# Patient Record
Sex: Male | Born: 1935 | Race: White | Hispanic: No | Smoking: Former smoker
Health system: Southern US, Community
[De-identification: ages and names within clinical notes are randomized; demographics above are authoritative.]

## PROBLEM LIST (undated history)

## (undated) DIAGNOSIS — J189 Pneumonia, unspecified organism: Secondary | ICD-10-CM

## (undated) DIAGNOSIS — IMO0001 Reserved for inherently not codable concepts without codable children: Secondary | ICD-10-CM

## (undated) DIAGNOSIS — J449 Chronic obstructive pulmonary disease, unspecified: Secondary | ICD-10-CM

## (undated) DIAGNOSIS — C220 Liver cell carcinoma: Secondary | ICD-10-CM

## (undated) DIAGNOSIS — D696 Thrombocytopenia, unspecified: Secondary | ICD-10-CM

## (undated) DIAGNOSIS — I10 Essential (primary) hypertension: Secondary | ICD-10-CM

## (undated) DIAGNOSIS — I739 Peripheral vascular disease, unspecified: Secondary | ICD-10-CM

## (undated) DIAGNOSIS — Z950 Presence of cardiac pacemaker: Secondary | ICD-10-CM

## (undated) DIAGNOSIS — J939 Pneumothorax, unspecified: Secondary | ICD-10-CM

## (undated) DIAGNOSIS — E039 Hypothyroidism, unspecified: Secondary | ICD-10-CM

## (undated) DIAGNOSIS — M549 Dorsalgia, unspecified: Secondary | ICD-10-CM

## (undated) DIAGNOSIS — E119 Type 2 diabetes mellitus without complications: Secondary | ICD-10-CM

## (undated) DIAGNOSIS — I2510 Atherosclerotic heart disease of native coronary artery without angina pectoris: Secondary | ICD-10-CM

## (undated) DIAGNOSIS — E43 Unspecified severe protein-calorie malnutrition: Secondary | ICD-10-CM

## (undated) DIAGNOSIS — C801 Malignant (primary) neoplasm, unspecified: Secondary | ICD-10-CM

## (undated) DIAGNOSIS — R1310 Dysphagia, unspecified: Secondary | ICD-10-CM

## (undated) DIAGNOSIS — I4892 Unspecified atrial flutter: Secondary | ICD-10-CM

## (undated) DIAGNOSIS — E785 Hyperlipidemia, unspecified: Secondary | ICD-10-CM

## (undated) DIAGNOSIS — I7102 Dissection of abdominal aorta: Secondary | ICD-10-CM

## (undated) HISTORY — DX: Presence of cardiac pacemaker: Z95.0

## (undated) HISTORY — DX: Malignant (primary) neoplasm, unspecified: C80.1

## (undated) HISTORY — PX: OTHER SURGICAL HISTORY: SHX169

## (undated) HISTORY — PX: COLONOSCOPY: SHX174

---

## 1998-04-15 ENCOUNTER — Ambulatory Visit (HOSPITAL_COMMUNITY): Admission: RE | Admit: 1998-04-15 | Discharge: 1998-04-15 | Payer: Self-pay | Admitting: *Deleted

## 1998-04-18 ENCOUNTER — Other Ambulatory Visit: Admission: RE | Admit: 1998-04-18 | Discharge: 1998-04-18 | Payer: Self-pay | Admitting: *Deleted

## 1999-12-23 ENCOUNTER — Emergency Department (HOSPITAL_COMMUNITY): Admission: EM | Admit: 1999-12-23 | Discharge: 1999-12-23 | Payer: Self-pay | Admitting: Emergency Medicine

## 2000-04-18 ENCOUNTER — Encounter: Payer: Self-pay | Admitting: Emergency Medicine

## 2000-04-18 ENCOUNTER — Emergency Department (HOSPITAL_COMMUNITY): Admission: EM | Admit: 2000-04-18 | Discharge: 2000-04-18 | Payer: Self-pay | Admitting: *Deleted

## 2000-07-22 ENCOUNTER — Encounter: Admission: RE | Admit: 2000-07-22 | Discharge: 2000-07-22 | Payer: Self-pay | Admitting: *Deleted

## 2000-07-22 ENCOUNTER — Encounter: Payer: Self-pay | Admitting: *Deleted

## 2000-12-31 HISTORY — PX: CAROTID STENT: SHX1301

## 2001-08-25 ENCOUNTER — Encounter: Payer: Self-pay | Admitting: Emergency Medicine

## 2001-08-25 ENCOUNTER — Emergency Department (HOSPITAL_COMMUNITY): Admission: EM | Admit: 2001-08-25 | Discharge: 2001-08-25 | Payer: Self-pay | Admitting: Emergency Medicine

## 2001-09-05 ENCOUNTER — Encounter: Payer: Self-pay | Admitting: Cardiovascular Disease

## 2001-09-05 ENCOUNTER — Ambulatory Visit (HOSPITAL_COMMUNITY): Admission: RE | Admit: 2001-09-05 | Discharge: 2001-09-05 | Payer: Self-pay | Admitting: Cardiovascular Disease

## 2002-05-20 ENCOUNTER — Ambulatory Visit (HOSPITAL_COMMUNITY): Admission: RE | Admit: 2002-05-20 | Discharge: 2002-05-20 | Payer: Self-pay | Admitting: Internal Medicine

## 2002-05-20 ENCOUNTER — Encounter: Payer: Self-pay | Admitting: Internal Medicine

## 2003-07-27 ENCOUNTER — Inpatient Hospital Stay (HOSPITAL_COMMUNITY): Admission: EM | Admit: 2003-07-27 | Discharge: 2003-07-28 | Payer: Self-pay | Admitting: Emergency Medicine

## 2003-07-27 ENCOUNTER — Encounter: Payer: Self-pay | Admitting: Internal Medicine

## 2004-03-04 ENCOUNTER — Emergency Department (HOSPITAL_COMMUNITY): Admission: EM | Admit: 2004-03-04 | Discharge: 2004-03-04 | Payer: Self-pay | Admitting: Emergency Medicine

## 2004-07-20 ENCOUNTER — Emergency Department (HOSPITAL_COMMUNITY): Admission: EM | Admit: 2004-07-20 | Discharge: 2004-07-20 | Payer: Self-pay | Admitting: Emergency Medicine

## 2004-10-11 ENCOUNTER — Emergency Department (HOSPITAL_COMMUNITY): Admission: EM | Admit: 2004-10-11 | Discharge: 2004-10-11 | Payer: Self-pay | Admitting: Emergency Medicine

## 2005-01-12 ENCOUNTER — Inpatient Hospital Stay (HOSPITAL_COMMUNITY): Admission: EM | Admit: 2005-01-12 | Discharge: 2005-01-14 | Payer: Self-pay | Admitting: *Deleted

## 2005-01-23 ENCOUNTER — Inpatient Hospital Stay (HOSPITAL_COMMUNITY): Admission: RE | Admit: 2005-01-23 | Discharge: 2005-01-24 | Payer: Self-pay | Admitting: *Deleted

## 2006-03-19 ENCOUNTER — Encounter: Admission: RE | Admit: 2006-03-19 | Discharge: 2006-03-19 | Payer: Self-pay | Admitting: Internal Medicine

## 2008-03-03 ENCOUNTER — Emergency Department (HOSPITAL_COMMUNITY): Admission: EM | Admit: 2008-03-03 | Discharge: 2008-03-03 | Payer: Self-pay | Admitting: Emergency Medicine

## 2008-04-28 ENCOUNTER — Emergency Department (HOSPITAL_COMMUNITY): Admission: EM | Admit: 2008-04-28 | Discharge: 2008-04-28 | Payer: Self-pay | Admitting: Emergency Medicine

## 2009-10-20 ENCOUNTER — Encounter: Admission: RE | Admit: 2009-10-20 | Discharge: 2009-10-20 | Payer: Self-pay | Admitting: Internal Medicine

## 2009-12-31 HISTORY — PX: DOPPLER ECHOCARDIOGRAPHY: SHX263

## 2010-09-20 ENCOUNTER — Encounter: Admission: RE | Admit: 2010-09-20 | Discharge: 2010-09-20 | Payer: Self-pay | Admitting: Internal Medicine

## 2010-11-23 ENCOUNTER — Emergency Department (HOSPITAL_COMMUNITY): Admission: EM | Admit: 2010-11-23 | Discharge: 2010-11-23 | Payer: Self-pay | Admitting: Emergency Medicine

## 2010-12-11 ENCOUNTER — Encounter
Admission: RE | Admit: 2010-12-11 | Discharge: 2010-12-11 | Payer: Self-pay | Source: Home / Self Care | Attending: Internal Medicine | Admitting: Internal Medicine

## 2010-12-31 HISTORY — PX: CAROTID ANGIOGRAM: SHX5765

## 2011-01-18 ENCOUNTER — Ambulatory Visit (HOSPITAL_COMMUNITY)
Admission: RE | Admit: 2011-01-18 | Discharge: 2011-01-18 | Payer: Self-pay | Source: Home / Self Care | Attending: Gastroenterology | Admitting: Gastroenterology

## 2011-01-22 LAB — CBC
HCT: 41.3 % (ref 39.0–52.0)
MCHC: 34.4 g/dL (ref 30.0–36.0)
Platelets: 172 10*3/uL (ref 150–400)
RDW: 13.8 % (ref 11.5–15.5)

## 2011-01-22 LAB — PROTIME-INR
INR: 0.99 (ref 0.00–1.49)
Prothrombin Time: 13.3 seconds (ref 11.6–15.2)

## 2011-01-22 LAB — GLUCOSE, CAPILLARY: Glucose-Capillary: 118 mg/dL — ABNORMAL HIGH (ref 70–99)

## 2011-03-13 LAB — POCT CARDIAC MARKERS
CKMB, poc: 4.1 ng/mL (ref 1.0–8.0)
Myoglobin, poc: 78.1 ng/mL (ref 12–200)
Troponin i, poc: <0.05 ng/mL (ref 0.00–0.09)

## 2011-03-13 LAB — CBC
HCT: 41.7 % (ref 39.0–52.0)
Hemoglobin: 14.4 g/dL (ref 13.0–17.0)
MCH: 32.6 pg (ref 26.0–34.0)
MCHC: 34.6 g/dL (ref 30.0–36.0)
MCV: 94.1 fL (ref 78.0–100.0)
Platelets: 124 K/uL — ABNORMAL LOW (ref 150–400)
RBC: 4.43 MIL/uL (ref 4.22–5.81)
RDW: 13.6 % (ref 11.5–15.5)
WBC: 7.1 K/uL (ref 4.0–10.5)

## 2011-03-13 LAB — COMPREHENSIVE METABOLIC PANEL WITH GFR
AST: 32 U/L (ref 0–37)
Albumin: 4.1 g/dL (ref 3.5–5.2)
BUN: 15 mg/dL (ref 6–23)
Chloride: 101 meq/L (ref 96–112)
Creatinine, Ser: 1.11 mg/dL (ref 0.4–1.5)
GFR calc Af Amer: >60 mL/min (ref >60)
Potassium: 4.2 meq/L (ref 3.5–5.1)
Total Bilirubin: 0.9 mg/dL (ref 0.3–1.2)
Total Protein: 7.3 g/dL (ref 6.0–8.3)

## 2011-03-13 LAB — DIFFERENTIAL
Basophils Absolute: 0 K/uL (ref 0.0–0.1)
Basophils Relative: 0 % (ref 0–1)
Eosinophils Absolute: 0.1 10*3/uL (ref 0.0–0.7)
Eosinophils Relative: 1 % (ref 0–5)
Lymphocytes Relative: 15 % (ref 12–46)
Lymphs Abs: 1.1 K/uL (ref 0.7–4.0)
Monocytes Absolute: 0.6 K/uL (ref 0.1–1.0)
Monocytes Relative: 9 % (ref 3–12)
Neutro Abs: 5.3 K/uL (ref 1.7–7.7)
Neutrophils Relative %: 75 % (ref 43–77)

## 2011-03-13 LAB — POCT I-STAT, CHEM 8
BUN: 15 mg/dL (ref 6–23)
Calcium, Ion: 1.03 mmol/L — ABNORMAL LOW (ref 1.12–1.32)
Chloride: 101 meq/L (ref 96–112)
Creatinine, Ser: 1.2 mg/dL (ref 0.4–1.5)
Glucose, Bld: 147 mg/dL — ABNORMAL HIGH (ref 70–99)
HCT: 43 % (ref 39.0–52.0)
Hemoglobin: 14.6 g/dL (ref 13.0–17.0)
Potassium: 3.9 meq/L (ref 3.5–5.1)
Sodium: 141 meq/L (ref 135–145)
TCO2: 28 mmol/L (ref 0–100)

## 2011-03-13 LAB — COMPREHENSIVE METABOLIC PANEL
ALT: 21 U/L (ref 0–53)
Alkaline Phosphatase: 102 U/L (ref 39–117)
CO2: 30 mEq/L (ref 19–32)
Calcium: 9.1 mg/dL (ref 8.4–10.5)
GFR calc non Af Amer: >60 mL/min (ref >60)
Glucose, Bld: 154 mg/dL — ABNORMAL HIGH (ref 70–99)
Sodium: 141 mEq/L (ref 135–145)

## 2011-03-13 LAB — URINALYSIS, ROUTINE W REFLEX MICROSCOPIC
Bilirubin Urine: NEGATIVE
Glucose, UA: 500 mg/dL — AB
Hgb urine dipstick: NEGATIVE
Ketones, ur: NEGATIVE mg/dL
Nitrite: NEGATIVE
Protein, ur: NEGATIVE mg/dL
Specific Gravity, Urine: 1.015 (ref 1.005–1.030)
Urobilinogen, UA: 1 mg/dL (ref 0.0–1.0)
pH: 8 (ref 5.0–8.0)

## 2011-03-13 LAB — LIPASE, BLOOD: Lipase: 33 U/L (ref 11–59)

## 2011-03-13 LAB — GLUCOSE, CAPILLARY: Glucose-Capillary: 160 mg/dL — ABNORMAL HIGH (ref 70–99)

## 2011-04-07 ENCOUNTER — Emergency Department (HOSPITAL_COMMUNITY)
Admission: EM | Admit: 2011-04-07 | Discharge: 2011-04-07 | Disposition: A | Discharge status: Home / Self Care | Payer: PRIVATE HEALTH INSURANCE | Attending: Emergency Medicine | Admitting: Emergency Medicine

## 2011-04-07 DIAGNOSIS — E119 Type 2 diabetes mellitus without complications: Secondary | ICD-10-CM | POA: Insufficient documentation

## 2011-04-07 DIAGNOSIS — M25519 Pain in unspecified shoulder: Secondary | ICD-10-CM | POA: Insufficient documentation

## 2011-04-09 LAB — GLUCOSE, CAPILLARY: Glucose-Capillary: 161 mg/dL — ABNORMAL HIGH (ref 70–99)

## 2011-05-18 NOTE — Cardiovascular Report (Signed)
NAME:  HAWKE, VILLALPANDO NO.:  [id number]   MEDICAL RECORD NO.:  [id number]          PATIENT TYPE:  INP   LOCATION:  4731                         FACILITY:  [facility]   PHYSICIAN:  Mark E. Severiano Gilbert, M.D.    DATE OF BIRTH:  1931-09-20   DATE OF PROCEDURE:  01/23/2005  DATE OF DISCHARGE:                              CARDIAC CATHETERIZATION   PROCEDURES PERFORMED:  1.  Comprehensive electrophysiology study with arrhythmia induction.  2.  Left atrial pacing and recording from coronary sinus.  3.  Intra-atrial mapping to identify tachycardia site of origin.  4.  Radiofrequency ablation of atrioventricular nodal slow pathway.  5.  Programmed stimulation and pacing after infusion of isoproterenol.   PREPROCEDURE DIAGNOSIS:  Paroxysmal supraventricular tachycardia.   POSTPROCEDURE DIAGNOSIS:  Typical atrioventricular nodal reentrant  tachycardia.   CARDIOLOGIST:  Janeece Riggers. Severiano Gilbert, M.D.   COMPLICATIONS:  None.   ESTIMATED BLOOD LOSS:  Less than 30 mL.   PROCEDURE NARRATIVE:  The patient was brought to the EP laboratory in the  fasting state.  The patient's left and right groins were prepped and draped  in the usual manner.  Two 7 French sheaths were placed in the left femoral  vein and teo 7 French sheaths were placed in the right femoral vein. A  quadripolar catheter was placed in the right atrium, a quadripolar catheter  was placed in the right ventricular apex, a hexapolar catheter was placed in  the His bundle area and a decapolar catheter in the proximal coronary sinus.  A complete EP study was performed with and without isoproterenol.   The retrograde ventricular block cycle length was 400 msec.  Retrograde  atrial activation off the drive train of 756 msec was decremental with  retrograde AV node ERP 380 msec.  Antegrade conduction was decremental.  The  fast pathway effective refractory period off the drive train of 433 msec was  greater than 650 msec.  The slow  pathway ERP of the drive train of 295 msec  was less than 350 msec.  Single echo beat were easily generated during  atrial premature testing.  During isoproterenol infusion double atrial  extrastimuli from the high right atrium easily induced a narrow complex  tachycardia cycle length of 300 msec.  The VA time of the tachycardia was  less than 50 msec.  The preexcitation index was long.  His refractory PVCs  did not preexcite the atrium.  All these findings are most consistent with  typical atrioventricular nodal reentrant tachycardia.  The tachycardia was  easily burst terminated from the ventricle as well as the atrium.  The high  right atrial catheter was exchanged for an EPT asymmetric ablation catheter  temperature-controlled.  The catheter was advanced to the tricuspid annulus  and the tricuspid annulus was mapped in the P1, P2, M2 and M1 zones.  Several radiofrequency applications were made in the posterior zones without  creating junctional rhythm.  The catheter was readjusted slightly more  medial and slightly more superior into the M2 and M1 zones.  Three burns, a  total of approximately 50  seconds of radiofrequency energy, were delivered  with excellent heating characteristics between 55 and 60 degrees with  multiple junctional rhythms created.   After waiting 20 minutes with isoproterenol infusion we were unable to  reinitiate tachycardia.  After allowing isoproterenol to wear off a post EP  study was done.  The H-V interval was 46 msec post EP, which is normal.  There was no evidence of preexcitation.  Atrial pacing at 700 msec  demonstrated fast pathway ERP at approximately 580 msec.  There was a single  echo beat noted right at ERP.  There did not appear to be any true dual AV  node physiology remaining.  Retrograde block cycle length was 270 msec as  there was some lingering isoproterenol present and the patient was awake.  Retrograde conduction from the ventricle to the  atrium was midline and  decremental.  The retrograde ERP at the AV node off the drive train of 161  msec was approximately 360 msec.   IMPRESSION:  Typical atrioventricular node reentrant tachycardia status post  successful slow pathway modification.      MEP/MEDQ  D:  01/23/2005  T:  01/24/2005  Job:  [id number]

## 2011-05-18 NOTE — Discharge Summary (Signed)
   NAME:  [name], PRYCE NO.:  [id number]   MEDICAL RECORD NO.:  [id number]                   PATIENT TYPE:  INP   LOCATION:  2902                                 FACILITY:  [facility]   PHYSICIAN:  Olene Craven, M.D.            DATE OF BIRTH:  [birth year]   DATE OF ADMISSION:  07/27/2003  DATE OF DISCHARGE:  07/28/2003                                 DISCHARGE SUMMARY   DISCHARGE DIAGNOSES:  1. Supraventricular tachycardia.  2. Hyperlipidemia.  3. Peripheral vascular disease.  4. Hypertension.  5. Diabetes.  6. ETOH abuse.   DISCHARGE MEDICATIONS:  1. Plavix 75 mg p.o. daily.  2. __________  4 mg p.o. daily.  3. Synthroid 100 mcg p.o. daily.  4. Lescol XL 80 mg p.o. q.h.s.  5. Actos 30 mg p.o. daily.  6. Glucophage 1000 mg p.o. b.i.d.  7. __________ 1000 mg p.o. q.h.s.  8. Xanax 0.25 mg p.o. t.i.d. PRN.  9. Toprol XL 12.5 mg p.o. daily.   CONSULTATIONS:  [facility].   PROCEDURES:  Patient was converted to normal sinus rhythm with Adenocard.   FOLLOW UP:  The patient is to follow up with Dr. Allyson Sabal in two to three  weeks.  Patient is to follow up with Dr. Barbee Shropshire as scheduled.   HOSPITAL COURSE:  The patient was admitted on July 27, 2003 after being seen  in primary care office and noted to be in SVT with rate of approximately  160, relative hypotension at 96/60 and diaphoretic with occasional chest  discomfort.  He reported a history of one to two weeks of recurrent episodes  of palpitations.  He was admitted for further evaluation and observation.  While in the emergency department he was given Adenocard with conversion.  He was watched overnight in the telemetry unit.  He remained in normal sinus  rhythm.  He is being discharged in stable condition with the addition of  Toprol 12.5 mg p.o. daily to his medications.  No other medication changes  were noted.  The patient has been having some trouble with stress and  anxiety  of late and Xanax 0.25 mg p.o. t.i.d.  was added.  The patient will follow up with Dr. Allyson Sabal in two to three weeks  and will follow up as scheduled with Dr. Barbee Shropshire.   CONDITION ON DISCHARGE:  The patient is being discharged in stable  condition.                                                Olene Craven, M.D.    DEH/MEDQ  D:  07/28/2003  T:  07/28/2003  Job:  [id number]

## 2011-05-18 NOTE — Discharge Summary (Signed)
[name], [name] NO.:  [id number]   MEDICAL RECORD NO.:  [id number]          PATIENT TYPE:  INP   LOCATION:  4714                         FACILITY:  [facility]   PHYSICIAN:  [name], M.D.DATE OF BIRTH:  Mar 16, 1931   DATE OF ADMISSION:  01/12/2005  DATE OF DISCHARGE:  01/14/2005                                 DISCHARGE SUMMARY   DISCHARGE DIAGNOSES:  1.  Paroxysmal supraventricular tachycardia, the patient was seen in consult      by Dr. Severiano [name] this admission.  2.  Diabetes.  3.  Peripheral vascular disease with history of carotid stenting in the      past.  4.  Possible iatrogenic hyperthyroidism, follow up  TSH is pending.  5.  Dyslipidemia.  6.  History of alcoholism.   HOSPITAL COURSE:  The patient is a 77 year old male followed by Dr. Allyson [name]  and Dr. Barbee [name] with a history of SVT.  He has had a negative Cardiolite  study in the past in August of 2004.  Echocardiogram shows good LV function.  He has never had a catheterization.  He did have a stent to his carotid by  Dr. __________ in 2002.  He presented to the emergency room on January 12, 2005 with SVT.  He has had about 3 episodes in the last year and a half  requiring ER visits.  He was given IV Cardizem and converted to sinus rhythm  with AV block with a PR of 0.36.  He was seen in consult by Dr. Severiano [name].  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  He may be a candidate for an ablation.  He  will see the patient in follow up as an outpatient.  Initial labs did  indicate a TSH of 0.046.  A follow up TSH is pending.  T3 and T4 were  obtained prior to discharge.  His T4 was 7.3 which is within normal limits  and his T3 was 125 which is also within normal limits.  We did cut his  Synthroid back slightly to 0.1 mg a day.  He will follow up with Dr.  Barbee [name] regarding this.  He also apparently has not been on his diabetes  medicine or his Lopid.  These were resumed.   DISCHARGE MEDICATIONS:  1.   Toprol-XL 25 mg a day.  2.  Glyburide 5 mg b.i.d.  3.  Synthroid 0.1 mg a day.  4.  Lopid 600 mg b.i.d.  5.  A coated aspirin daily.   LABORATORY DATA:  Magnesium 1.7.  Hemoglobin A1C 7.5.  CK-MB and troponins  are negative.  Sodium 135, potassium 4.1 BUN 15, creatinine 0.8.  Liver  functions are normal.  Lipid panel shows a cholesterol of 173, triglycerides  204, HDL of 30, LDL 102.  Chest x-ray shows COPD.   DISPOSITION:  The patient is discharged in stable condition.  He is in sinus  rhythm, sinus bradycardia with a first-degree AV block.  He will follow up  with Dr. Severiano [name] next week.  We strongly encouraged him to cut back on his  alcohol intake, he apparently drinks 4-5 bourbons a night.  His family says  he has not had problems with DT's in the past.       LKK/MEDQ  D:  01/14/2005  T:  01/14/2005  Job:  [id number]   cc:   Janeece Riggers. [name], M.D.  Fax: [phone]   Olene Craven, M.D.  [address]  [address]  [place]  [place] [address]  Fax: [phone]

## 2011-09-24 LAB — I-STAT 8, (EC8 V) (CONVERTED LAB)
Acid-Base Excess: 1
BUN: 14
Chloride: 105
Glucose, Bld: 128 — ABNORMAL HIGH
Potassium: 4
pCO2, Ven: 46.5
pH, Ven: 7.374 — ABNORMAL HIGH

## 2011-09-24 LAB — POCT I-STAT CREATININE
Creatinine, Ser: 0.9
Operator id: 146091

## 2011-09-24 LAB — PROTIME-INR: INR: 0.9

## 2011-09-30 ENCOUNTER — Emergency Department (HOSPITAL_COMMUNITY)
Admission: EM | Admit: 2011-09-30 | Discharge: 2011-10-01 | Disposition: A | Discharge status: Home / Self Care | Payer: PRIVATE HEALTH INSURANCE | Attending: Emergency Medicine | Admitting: Emergency Medicine

## 2011-09-30 DIAGNOSIS — X58XXXA Exposure to other specified factors, initial encounter: Secondary | ICD-10-CM | POA: Insufficient documentation

## 2011-09-30 DIAGNOSIS — T7840XA Allergy, unspecified, initial encounter: Secondary | ICD-10-CM | POA: Insufficient documentation

## 2011-09-30 DIAGNOSIS — I10 Essential (primary) hypertension: Secondary | ICD-10-CM | POA: Insufficient documentation

## 2011-09-30 DIAGNOSIS — R21 Rash and other nonspecific skin eruption: Secondary | ICD-10-CM | POA: Insufficient documentation

## 2011-09-30 DIAGNOSIS — E119 Type 2 diabetes mellitus without complications: Secondary | ICD-10-CM | POA: Insufficient documentation

## 2011-09-30 DIAGNOSIS — E785 Hyperlipidemia, unspecified: Secondary | ICD-10-CM | POA: Insufficient documentation

## 2011-11-05 DIAGNOSIS — C228 Malignant neoplasm of liver, primary, unspecified as to type: Secondary | ICD-10-CM | POA: Insufficient documentation

## 2011-11-05 DIAGNOSIS — E785 Hyperlipidemia, unspecified: Secondary | ICD-10-CM | POA: Insufficient documentation

## 2011-11-05 DIAGNOSIS — I441 Atrioventricular block, second degree: Secondary | ICD-10-CM | POA: Insufficient documentation

## 2011-11-05 DIAGNOSIS — I672 Cerebral atherosclerosis: Secondary | ICD-10-CM | POA: Insufficient documentation

## 2011-11-13 DIAGNOSIS — I7102 Dissection of abdominal aorta: Secondary | ICD-10-CM

## 2011-11-13 DIAGNOSIS — I519 Heart disease, unspecified: Secondary | ICD-10-CM | POA: Insufficient documentation

## 2011-11-13 HISTORY — DX: Dissection of abdominal aorta: I71.02

## 2012-04-02 ENCOUNTER — Telehealth: Payer: Self-pay | Admitting: Hematology and Oncology

## 2012-04-02 NOTE — Telephone Encounter (Signed)
lmonvm adviisng the pt of his new pt appt on 04/08/2012 asked that the pt call back to confirm the appt

## 2012-04-04 ENCOUNTER — Telehealth: Payer: Self-pay | Admitting: Hematology and Oncology

## 2012-04-04 NOTE — Telephone Encounter (Signed)
S/w the pt and he confirmed his appt for 04/08/2012@1 :00pm

## 2012-04-04 NOTE — Telephone Encounter (Signed)
Del. 04/04/12 Dx. PLTS Ref.Arennette AnthonyFNP-C

## 2012-04-08 ENCOUNTER — Ambulatory Visit (HOSPITAL_BASED_OUTPATIENT_CLINIC_OR_DEPARTMENT_OTHER): Payer: PRIVATE HEALTH INSURANCE | Admitting: Lab

## 2012-04-08 ENCOUNTER — Ambulatory Visit (HOSPITAL_BASED_OUTPATIENT_CLINIC_OR_DEPARTMENT_OTHER): Payer: PRIVATE HEALTH INSURANCE | Admitting: Hematology and Oncology

## 2012-04-08 ENCOUNTER — Encounter: Payer: Self-pay | Admitting: Hematology and Oncology

## 2012-04-08 ENCOUNTER — Other Ambulatory Visit: Payer: PRIVATE HEALTH INSURANCE | Admitting: Lab

## 2012-04-08 ENCOUNTER — Telehealth: Payer: Self-pay | Admitting: Hematology and Oncology

## 2012-04-08 ENCOUNTER — Ambulatory Visit: Payer: PRIVATE HEALTH INSURANCE

## 2012-04-08 VITALS — BP 118/63 | HR 55 | Temp 97.4°F | Ht 69.0 in | Wt 146.0 lb

## 2012-04-08 DIAGNOSIS — D539 Nutritional anemia, unspecified: Secondary | ICD-10-CM

## 2012-04-08 DIAGNOSIS — D696 Thrombocytopenia, unspecified: Secondary | ICD-10-CM

## 2012-04-08 DIAGNOSIS — F1011 Alcohol abuse, in remission: Secondary | ICD-10-CM | POA: Insufficient documentation

## 2012-04-08 DIAGNOSIS — C220 Liver cell carcinoma: Secondary | ICD-10-CM | POA: Insufficient documentation

## 2012-04-08 DIAGNOSIS — E119 Type 2 diabetes mellitus without complications: Secondary | ICD-10-CM | POA: Insufficient documentation

## 2012-04-08 DIAGNOSIS — E039 Hypothyroidism, unspecified: Secondary | ICD-10-CM | POA: Insufficient documentation

## 2012-04-08 DIAGNOSIS — F1021 Alcohol dependence, in remission: Secondary | ICD-10-CM

## 2012-04-08 DIAGNOSIS — D691 Qualitative platelet defects: Secondary | ICD-10-CM

## 2012-04-08 DIAGNOSIS — Z8505 Personal history of malignant neoplasm of liver: Secondary | ICD-10-CM

## 2012-04-08 DIAGNOSIS — I10 Essential (primary) hypertension: Secondary | ICD-10-CM | POA: Insufficient documentation

## 2012-04-08 LAB — CBC WITH DIFFERENTIAL/PLATELET
Basophils Absolute: 0 10*3/uL (ref 0.0–0.1)
EOS%: 3.7 % (ref 0.0–7.0)
Eosinophils Absolute: 0.2 10*3/uL (ref 0.0–0.5)
LYMPH%: 29.7 % (ref 14.0–49.0)
MCH: 31 pg (ref 27.2–33.4)
MCV: 90.9 fL (ref 79.3–98.0)
MONO%: 15.1 % — ABNORMAL HIGH (ref 0.0–14.0)
NEUT#: 2.2 10*3/uL (ref 1.5–6.5)
Platelets: 86 10*3/uL — ABNORMAL LOW (ref 140–400)
RBC: 4.03 10*6/uL — ABNORMAL LOW (ref 4.20–5.82)

## 2012-04-08 LAB — COMPREHENSIVE METABOLIC PANEL
Albumin: 3.7 g/dL (ref 3.5–5.2)
CO2: 30 mEq/L (ref 19–32)
Calcium: 9.4 mg/dL (ref 8.4–10.5)
Glucose, Bld: 119 mg/dL — ABNORMAL HIGH (ref 70–99)
Potassium: 4.1 mEq/L (ref 3.5–5.3)
Sodium: 137 mEq/L (ref 135–145)
Total Protein: 7.6 g/dL (ref 6.0–8.3)

## 2012-04-08 NOTE — Progress Notes (Signed)
This office note has been dictated.

## 2012-04-08 NOTE — Progress Notes (Signed)
Dr.    Reece Leader   [place]     -    Primary. Dr.    Bebe Liter     -    Primary. Dr.    Nanetta Batty      -     Cardiologist. Dr.    Loyce Dys    -       [facility]    [place] [facility] [place]  Dr.  Isac Caddy    Phone      [phone]. Daughter     Haze Justin   Phone       [phone].

## 2012-04-08 NOTE — Patient Instructions (Signed)
Patient to follow up as instructed.   No current outpatient prescriptions on file.        April 2013  Sunday Monday Tuesday Wednesday Thursday Friday Saturday      1   2   3   4   5   6    7   8   9    FINANCIAL COUNSELING   1:00 PM  (30 min.)  [facility]  [facility]   NEW PATIENT 60   1:30 PM  (60 min.)  Laurice Record, MD   [facility] 10   11   12   13    14   15   16   17   18   19   20    21   22   23   24   25   26   27    28   29    30

## 2012-04-08 NOTE — Telephone Encounter (Signed)
gve the pt his April 2013 appt calendar along with the ct scan appt

## 2012-04-08 NOTE — Progress Notes (Signed)
CC:   [name]. Renae Gloss, M.D. Samuel Dys, [name]  IDENTIFYING STATEMENT:  The patient is a 78 year old man seen at request of Dr. Renae Gloss with thrombocytopenia.  HISTORY OF PRESENT ILLNESS:  The patient has a past medical history significant for alcohol abuse and has also undergone a liver resection for what he describes as a possible hepatocellular carcinoma.  This was performed at [facility] in 01/2011.  During routine physical, he was found to have lowish platelet counts.  The patient had denied history of bruising or bleeding.  He denied any recent infections.  He had not lost any weight.  He also denied adenopathy.  He does have right shoulder pain which he describes as arthritic for which he takes oxycodone.  He has not noted any rectal bleeding.  Review of blood work obtained from Dr. Mathews Robinsons office notes the following:  10/24/2011 white cell count 4, hemoglobin 12.1, hematocrit 37.4, platelets 117; 02/19/2012 white cell count 4.9, hemoglobin 13.1, hematocrit 35.7, platelets 94; 03/07/2012 white cell count 4.5, hemoglobin 12.5, hematocrit 37.3, platelets 97.  The patient notes that his diet is well balanced.  PAST MEDICAL HISTORY: 1. Diabetes mellitus. 2. Hypercholesterolemia. 3. History of hypothyroidism. 4. HCC status post resection at [facility] on 02/12. 5. Alcohol abuse.  ALLERGIES:  None.  MEDICATIONS: 1. Lisinopril 10 mg daily. 2. Trigenta 5 mg daily. 3. Synthroid 100 mcg daily. 4. Aspirin 81 mg daily.  SOCIAL HISTORY:  The patient is a widow with 2 children.  He has a remote history of tobacco use.  Smoked for 60 years, and states stopped a year ago.  He also has a history of alcohol use and has drank on and off in the last couple of years but has not had alcohol in the last year.  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  FAMILY HISTORY:  Negative for oncologic or hematologic disorders.  REVIEW OF SYSTEMS:  Denies fever, chills, night sweats, anorexia,  weight loss.  He has intermittent right shoulder pain controlled with oxycodone.  Cardiovascular:  Denies chest pain, PND, orthopnea, ankle swelling.  Respirations: Denies cough, hemoptysis, wheeze, shortness of breath.  GI: Denies nausea, vomiting, abdominal pain, diarrhea, melena, hematochezia.  Skin:  Denies bruising or bleeding.  Musculoskeletal: Right shoulder pain.  Denies muscle pain.  Neurologic:  Denies headaches, vision change, extremity weakness.  Lymph glands:  Negative. Rest of review of systems negative.  PHYSICAL EXAM:  General: Patient is alert, oriented x3.  He is in no distress.  Vitals:  Pulse 55, blood pressure 118/63, temperature 97.4, respirations 18, weight 146 pounds.  HEENT:  Head is atraumatic, normocephalic.  Sclerae anicteric.  Mouth moist.  Neck:  Supple.  Chest: Clear.  CVS:  First and second heart sounds present.  No added sounds or murmurs.  Abdomen:  Soft, nontender.  Bowel sounds present. Extremities:  No calf tenderness or edema.  Pulses present and symmetrical.  Lymph nodes:  No palpable cervical, axillary, or inguinal adenopathy.  CNS:  Nonfocal.  IMPRESSION AND PLAN:  [name] is a 78 year old man with thrombocytopenia.  He also has history of alcohol use and apparently had undergone resection for [facility] in 2012.  We spent some time discussing etiology.  We discussed the use of alcohol can potentiate thrombocytopenia.  We also discussed medications.  The patient has had a history of HCC. Because of his current history of thrombocytopenia I would recommend that he receive a CT scan of the abdomen.  In addition to  that, we will also recommend hematologic evaluation to include a repeat CBC.  I will review morphology.  We will obtain a vitamin B12 level, and also include iron studies, as he is noted to be anemic on his last office blood work.  We will also rule out myelodyscrasia with serum protein electrophoresis.  We will also obtain a  hepatitis panel.  Spent more  Than half the time coordinating care.  The patient returns to discuss results.    ______________________________ Laurice Record, M.D. LIO/MEDQ  D:  04/08/2012  T:  04/08/2012  Job:  [id number]

## 2012-04-11 ENCOUNTER — Other Ambulatory Visit (HOSPITAL_COMMUNITY): Payer: PRIVATE HEALTH INSURANCE

## 2012-04-11 LAB — SPEP & IFE WITH QIG
Albumin ELP: 55.1 % — ABNORMAL LOW (ref 55.8–66.1)
Beta 2: 6.1 % (ref 3.2–6.5)
IgA: 532 mg/dL — ABNORMAL HIGH (ref 68–379)
IgM, Serum: 88 mg/dL (ref 41–251)
Total Protein, Serum Electrophoresis: 6.7 g/dL (ref 6.0–8.3)

## 2012-04-11 LAB — VITAMIN B12: Vitamin B-12: 474 pg/mL (ref 211–911)

## 2012-04-11 LAB — HEPATITIS PANEL, ACUTE: Hep B C IgM: NEGATIVE

## 2012-04-11 LAB — FOLATE: Folate: 18.6 ng/mL

## 2012-04-14 ENCOUNTER — Encounter: Payer: Self-pay | Admitting: Hematology and Oncology

## 2012-04-14 NOTE — Progress Notes (Signed)
04/14/2012  Good Morning Dr. Dalene Carrow,  [facility] had an order for abdomen/pelvis ct scan.  [employer] Medicare has denied this scan because the patient had the same scan done @ [facility] in March, 2013.  In order for me to obtain these results I will need from you an signed statement saying you need this for continuum of care here.  I can also place this case back on but will go to peer-to-peer review.  I await your direction on this.  i have spoken with Mr. Packman daughter and she voiced understanding as to which I cancelled the ct scan.  I will contact her when the scan is either rescheduled or obtaiined from Medical Records @ [facility].  Mr. Haegele also had a chest ct scan done along with the abdomen/pelvis ct scan.  Thank you,  Bonita Quin [phone]

## 2012-04-15 ENCOUNTER — Encounter: Payer: Self-pay | Admitting: Hematology and Oncology

## 2012-04-15 NOTE — Progress Notes (Signed)
04/15/2012  Request for Ct Scan results to be sent to [facility] has been submitted to [facility] Records Department.

## 2012-04-17 ENCOUNTER — Telehealth: Payer: Self-pay | Admitting: Hematology and Oncology

## 2012-04-17 NOTE — Telephone Encounter (Signed)
Pt CT scanned ordered for 3/26 pt was no-show.

## 2012-04-18 ENCOUNTER — Ambulatory Visit: Payer: PRIVATE HEALTH INSURANCE | Admitting: Hematology and Oncology

## 2012-07-16 ENCOUNTER — Encounter (HOSPITAL_COMMUNITY): Payer: Self-pay

## 2012-07-16 ENCOUNTER — Emergency Department (HOSPITAL_COMMUNITY)
Admission: EM | Admit: 2012-07-16 | Discharge: 2012-07-16 | Disposition: A | Discharge status: Home / Self Care | Payer: PRIVATE HEALTH INSURANCE | Attending: Emergency Medicine | Admitting: Emergency Medicine

## 2012-07-16 DIAGNOSIS — Z8505 Personal history of malignant neoplasm of liver: Secondary | ICD-10-CM | POA: Insufficient documentation

## 2012-07-16 DIAGNOSIS — J029 Acute pharyngitis, unspecified: Secondary | ICD-10-CM

## 2012-07-16 DIAGNOSIS — I10 Essential (primary) hypertension: Secondary | ICD-10-CM | POA: Insufficient documentation

## 2012-07-16 DIAGNOSIS — E860 Dehydration: Secondary | ICD-10-CM | POA: Insufficient documentation

## 2012-07-16 DIAGNOSIS — E039 Hypothyroidism, unspecified: Secondary | ICD-10-CM | POA: Insufficient documentation

## 2012-07-16 DIAGNOSIS — R490 Dysphonia: Secondary | ICD-10-CM | POA: Insufficient documentation

## 2012-07-16 DIAGNOSIS — Z79899 Other long term (current) drug therapy: Secondary | ICD-10-CM | POA: Insufficient documentation

## 2012-07-16 DIAGNOSIS — Z87891 Personal history of nicotine dependence: Secondary | ICD-10-CM | POA: Insufficient documentation

## 2012-07-16 LAB — CBC
Hemoglobin: 14.5 g/dL (ref 13.0–17.0)
MCHC: 34.5 g/dL (ref 30.0–36.0)
Platelets: 140 10*3/uL — ABNORMAL LOW (ref 150–400)
RBC: 4.73 MIL/uL (ref 4.22–5.81)

## 2012-07-16 LAB — POCT I-STAT, CHEM 8
Calcium, Ion: 1.17 mmol/L (ref 1.13–1.30)
Chloride: 101 mEq/L (ref 96–112)
HCT: 45 % (ref 39.0–52.0)
Potassium: 4.2 mEq/L (ref 3.5–5.1)

## 2012-07-16 MED ORDER — GI COCKTAIL ~~LOC~~
30.0000 mL | Freq: Once | ORAL | Status: AC
  Administered 2012-07-16: 30 mL via ORAL
  Filled 2012-07-16: qty 30

## 2012-07-16 MED ORDER — SODIUM CHLORIDE 0.9 % IV BOLUS (SEPSIS)
500.0000 mL | Freq: Once | INTRAVENOUS | Status: AC
  Administered 2012-07-16: 500 mL via INTRAVENOUS

## 2012-07-16 MED ORDER — NYSTATIN 100000 UNIT/ML MT SUSP: 10.0000 mL | Freq: Three times a day (TID) | OROMUCOSAL | Status: AC

## 2012-07-16 MED ORDER — MORPHINE SULFATE 4 MG/ML IJ SOLN: 4.0000 mg | Freq: Once | INTRAMUSCULAR | Status: DC

## 2012-07-16 NOTE — ED Notes (Signed)
Tolerating soft foods without difficulty

## 2012-07-16 NOTE — ED Notes (Signed)
Patient c/o sore throat and being hoarse. Patient states he has not eaten in 5 days.

## 2012-07-16 NOTE — ED Provider Notes (Signed)
History     CSN: [id number]  Arrival date & time 07/16/12  1107   First MD Initiated Contact with Patient 07/16/12 1126      Chief Complaint  Patient presents with  . Hoarse    HPI Patient presents to the emergency room with complaints of sore throat and hoarseness that have been ongoing for at least the last 5 days. Patient states he has not been able to eat or drink well because of the pain and discomfort. He states he has had nothing to eat in the last 5 days. He denies any nausea or vomiting. His throat is very painful whenever he tries to swallow. He has not had any fevers, coughing or diarrhea. Patient saw his primary Dr. in the last few days and was started on azithromycin and amoxicillin. This has not helped.  Past medical history: Hypothyroidism, hypertension, carotid artery stenosis, remote history of liver cancer  Past Surgical History  Procedure Date  . Carotid stent   . Liver canc     History reviewed. No pertinent family history.  History  Substance Use Topics  . Smoking status: Former Smoker -- 1.0 packs/day for 50 years    Types: Cigarettes  . Smokeless tobacco: Not on file  . Alcohol Use: 1.2 oz/week    2 Cans of beer per week     daily      Review of Systems  All other systems reviewed and are negative.    Allergies  Review of patient's allergies indicates no known allergies.  Home Medications   Current Outpatient Rx  Name Route Sig Dispense Refill  . AMOXICILLIN 500 MG PO CAPS Oral Take 500 mg by mouth 3 (three) times daily.    . ASPIRIN 81 MG PO TABS Oral Take 81 mg by mouth daily.    . AZITHROMYCIN 250 MG PO TABS Oral Take 250 mg by mouth daily.    Marland Kitchen FLUTICASONE-SALMETEROL 250-50 MCG/DOSE IN AEPB Inhalation Inhale 1 puff into the lungs every 12 (twelve) hours.    Marland Kitchen HYDROCODONE-HOMATROPINE 5-1.5 MG/5ML PO SYRP Oral Take 5 mLs by mouth every 12 (twelve) hours as needed. For cough    . LEVOTHYROXINE SODIUM 100 MCG PO TABS Oral Take 100 mcg by  mouth daily.    Marland Kitchen LINAGLIPTIN 5 MG PO TABS Oral Take 5 mg by mouth daily.    Marland Kitchen LISINOPRIL 10 MG PO TABS Oral Take 10 mg by mouth daily.      BP 114/55  Pulse 105  Temp 98.6 F (37 C) (Oral)  Resp 18  SpO2 95%  Physical Exam  Nursing note and vitals reviewed. Constitutional: No distress.       Thin  HENT:  Head: Normocephalic and atraumatic. No trismus in the jaw.  Right Ear: External ear normal.  Left Ear: External ear normal.  Mouth/Throat: No uvula swelling. Oropharyngeal exudate, posterior oropharyngeal edema and posterior oropharyngeal erythema present. No tonsillar abscesses.       Numerous small circular lesion of erythema and white exudate  Eyes: Conjunctivae are normal. Right eye exhibits no discharge. Left eye exhibits no discharge. No scleral icterus.  Neck: Neck supple. No tracheal deviation present.  Cardiovascular: Normal rate, regular rhythm and intact distal pulses.   Pulmonary/Chest: Effort normal and breath sounds normal. No stridor. No respiratory distress. He has no wheezes. He has no rales.  Abdominal: Soft. Bowel sounds are normal. He exhibits no distension. There is no tenderness. There is no rebound and no guarding.  Musculoskeletal:  He exhibits no edema and no tenderness.  Neurological: He is alert. He has normal strength. No sensory deficit. Cranial nerve deficit:  no gross defecits noted. He exhibits normal muscle tone. He displays no seizure activity. Coordination normal.  Skin: Skin is warm and dry. No rash noted.  Psychiatric: He has a normal mood and affect.    ED Course  Procedures (including critical care time)  Labs Reviewed  POCT I-STAT, CHEM 8 - Abnormal; Notable for the following:    BUN 51 (*)     Creatinine, Ser 1.60 (*)     Glucose, Bld 128 (*)     All other components within normal limits  CBC - Abnormal; Notable for the following:    Platelets 140 (*)     All other components within normal limits   No results found.    MDM    Pt is tolerating PO in the ED.  He has been given soft foods as well.  Dehydration noted but treated in the ED with IV fluids and tolerating PO now.  Will dc home to continue his current regiment.  Will add nystatin as the appearance suggests a yeast component and he does have a steroid inhaler.  Pt has plans to follow up with PCP on friday        Celene Kras, MD 07/16/12 1428

## 2012-07-16 NOTE — ED Notes (Signed)
No complaints of pain-just states his throat is sore-will do trial of po fluids

## 2012-07-16 NOTE — ED Notes (Signed)
Tolerating po fluids-no dysphasia-states throat soreness improved

## 2012-07-16 NOTE — ED Notes (Signed)
Patient also reports that he went to his PCP 2 days ago and received cough syrup and antibiotics. Patient also states that they did a throat swab, but does not know the results.

## 2012-09-25 ENCOUNTER — Emergency Department (HOSPITAL_COMMUNITY): Payer: PRIVATE HEALTH INSURANCE

## 2012-09-25 ENCOUNTER — Encounter (HOSPITAL_COMMUNITY)
Admission: EM | Disposition: A | Discharge status: Rehabilitation Facility | Payer: Self-pay | Source: Home / Self Care | Attending: Neurological Surgery

## 2012-09-25 ENCOUNTER — Encounter (HOSPITAL_COMMUNITY): Payer: Self-pay | Admitting: Certified Registered"

## 2012-09-25 ENCOUNTER — Encounter (HOSPITAL_COMMUNITY): Payer: Self-pay | Admitting: *Deleted

## 2012-09-25 ENCOUNTER — Inpatient Hospital Stay (HOSPITAL_COMMUNITY): Payer: PRIVATE HEALTH INSURANCE | Admitting: Certified Registered"

## 2012-09-25 ENCOUNTER — Inpatient Hospital Stay (HOSPITAL_COMMUNITY)
Admission: EM | Admit: 2012-09-25 | Discharge: 2012-10-03 | DRG: 028 | Disposition: A | Discharge status: Rehabilitation Facility | Payer: PRIVATE HEALTH INSURANCE | Attending: Neurological Surgery | Admitting: Neurological Surgery

## 2012-09-25 ENCOUNTER — Inpatient Hospital Stay (HOSPITAL_COMMUNITY): Payer: PRIVATE HEALTH INSURANCE

## 2012-09-25 DIAGNOSIS — Y92410 Unspecified street and highway as the place of occurrence of the external cause: Secondary | ICD-10-CM

## 2012-09-25 DIAGNOSIS — E119 Type 2 diabetes mellitus without complications: Secondary | ICD-10-CM | POA: Diagnosis present

## 2012-09-25 DIAGNOSIS — F1011 Alcohol abuse, in remission: Secondary | ICD-10-CM

## 2012-09-25 DIAGNOSIS — J690 Pneumonitis due to inhalation of food and vomit: Secondary | ICD-10-CM | POA: Diagnosis not present

## 2012-09-25 DIAGNOSIS — Z23 Encounter for immunization: Secondary | ICD-10-CM

## 2012-09-25 DIAGNOSIS — Z87891 Personal history of nicotine dependence: Secondary | ICD-10-CM

## 2012-09-25 DIAGNOSIS — S129XXA Fracture of neck, unspecified, initial encounter: Secondary | ICD-10-CM

## 2012-09-25 DIAGNOSIS — J95821 Acute postprocedural respiratory failure: Secondary | ICD-10-CM | POA: Diagnosis not present

## 2012-09-25 DIAGNOSIS — S14121A Central cord syndrome at C1 level of cervical spinal cord, initial encounter: Secondary | ICD-10-CM | POA: Diagnosis present

## 2012-09-25 DIAGNOSIS — G9349 Other encephalopathy: Secondary | ICD-10-CM | POA: Diagnosis not present

## 2012-09-25 DIAGNOSIS — E1142 Type 2 diabetes mellitus with diabetic polyneuropathy: Secondary | ICD-10-CM | POA: Diagnosis present

## 2012-09-25 DIAGNOSIS — E1149 Type 2 diabetes mellitus with other diabetic neurological complication: Secondary | ICD-10-CM | POA: Diagnosis present

## 2012-09-25 DIAGNOSIS — Z8505 Personal history of malignant neoplasm of liver: Secondary | ICD-10-CM

## 2012-09-25 DIAGNOSIS — R29898 Other symptoms and signs involving the musculoskeletal system: Secondary | ICD-10-CM

## 2012-09-25 DIAGNOSIS — E785 Hyperlipidemia, unspecified: Secondary | ICD-10-CM | POA: Diagnosis present

## 2012-09-25 DIAGNOSIS — I10 Essential (primary) hypertension: Secondary | ICD-10-CM | POA: Diagnosis present

## 2012-09-25 DIAGNOSIS — Z79899 Other long term (current) drug therapy: Secondary | ICD-10-CM

## 2012-09-25 DIAGNOSIS — Z7982 Long term (current) use of aspirin: Secondary | ICD-10-CM

## 2012-09-25 DIAGNOSIS — R1310 Dysphagia, unspecified: Secondary | ICD-10-CM | POA: Diagnosis not present

## 2012-09-25 DIAGNOSIS — I679 Cerebrovascular disease, unspecified: Secondary | ICD-10-CM | POA: Diagnosis present

## 2012-09-25 DIAGNOSIS — E039 Hypothyroidism, unspecified: Secondary | ICD-10-CM | POA: Diagnosis present

## 2012-09-25 DIAGNOSIS — IMO0002 Reserved for concepts with insufficient information to code with codable children: Principal | ICD-10-CM | POA: Diagnosis present

## 2012-09-25 DIAGNOSIS — T148XXA Other injury of unspecified body region, initial encounter: Secondary | ICD-10-CM

## 2012-09-25 HISTORY — DX: Type 2 diabetes mellitus without complications: E11.9

## 2012-09-25 HISTORY — DX: Essential (primary) hypertension: I10

## 2012-09-25 HISTORY — DX: Hyperlipidemia, unspecified: E78.5

## 2012-09-25 HISTORY — DX: Liver cell carcinoma: C22.0

## 2012-09-25 HISTORY — PX: POSTERIOR CERVICAL FUSION/FORAMINOTOMY: SHX5038

## 2012-09-25 LAB — URINALYSIS, ROUTINE W REFLEX MICROSCOPIC
Nitrite: NEGATIVE
Protein, ur: NEGATIVE mg/dL
Specific Gravity, Urine: 1.018 (ref 1.005–1.030)
Urobilinogen, UA: 1 mg/dL (ref 0.0–1.0)

## 2012-09-25 LAB — BASIC METABOLIC PANEL
CO2: 24 mEq/L (ref 19–32)
Chloride: 103 mEq/L (ref 96–112)
Glucose, Bld: 156 mg/dL — ABNORMAL HIGH (ref 70–99)
Potassium: 4.3 mEq/L (ref 3.5–5.1)
Sodium: 138 mEq/L (ref 135–145)

## 2012-09-25 LAB — RAPID URINE DRUG SCREEN, HOSP PERFORMED
Cocaine: NOT DETECTED
Opiates: NOT DETECTED

## 2012-09-25 LAB — CBC WITH DIFFERENTIAL/PLATELET
Eosinophils Relative: 5 % (ref 0–5)
Lymphocytes Relative: 30 % (ref 12–46)
Lymphs Abs: 1.6 10*3/uL (ref 0.7–4.0)
MCV: 88.5 fL (ref 78.0–100.0)
Neutro Abs: 2.7 10*3/uL (ref 1.7–7.7)
Platelets: 95 10*3/uL — ABNORMAL LOW (ref 150–400)
RBC: 3.84 MIL/uL — ABNORMAL LOW (ref 4.22–5.81)
WBC: 5.5 10*3/uL (ref 4.0–10.5)

## 2012-09-25 LAB — PROTIME-INR
INR: 0.98 (ref 0.00–1.49)
Prothrombin Time: 12.9 seconds (ref 11.6–15.2)

## 2012-09-25 LAB — APTT: aPTT: 32 seconds (ref 24–37)

## 2012-09-25 SURGERY — POSTERIOR CERVICAL FUSION/FORAMINOTOMY LEVEL 2
Anesthesia: General | Site: Neck | Wound class: Clean

## 2012-09-25 MED ORDER — DEXAMETHASONE SODIUM PHOSPHATE 4 MG/ML IJ SOLN
INTRAMUSCULAR | Status: DC | PRN
  Administered 2012-09-25: 10 mg via INTRAVENOUS

## 2012-09-25 MED ORDER — SUCCINYLCHOLINE CHLORIDE 20 MG/ML IJ SOLN
INTRAMUSCULAR | Status: DC | PRN
  Administered 2012-09-25: 120 mg via INTRAVENOUS

## 2012-09-25 MED ORDER — SODIUM CHLORIDE 0.9 % IV SOLN
INTRAVENOUS | Status: DC | PRN
  Administered 2012-09-25 (×2): via INTRAVENOUS

## 2012-09-25 MED ORDER — PROPOFOL 10 MG/ML IV BOLUS
INTRAVENOUS | Status: DC | PRN
  Administered 2012-09-25: 130 mg via INTRAVENOUS

## 2012-09-25 MED ORDER — PHENYLEPHRINE HCL 10 MG/ML IJ SOLN
20.0000 mg | INTRAVENOUS | Status: DC | PRN
  Administered 2012-09-25: 50 ug/min via INTRAVENOUS

## 2012-09-25 MED ORDER — ONDANSETRON HCL 4 MG/2ML IJ SOLN
INTRAMUSCULAR | Status: DC | PRN
  Administered 2012-09-25: 4 mg via INTRAVENOUS

## 2012-09-25 MED ORDER — ACETAMINOPHEN 10 MG/ML IV SOLN
INTRAVENOUS | Status: DC | PRN
  Administered 2012-09-25: 1000 mg via INTRAVENOUS

## 2012-09-25 MED ORDER — EPHEDRINE SULFATE 50 MG/ML IJ SOLN
INTRAMUSCULAR | Status: DC | PRN
  Administered 2012-09-25: 10 mg via INTRAVENOUS

## 2012-09-25 MED ORDER — PHENYLEPHRINE HCL 10 MG/ML IJ SOLN
INTRAMUSCULAR | Status: DC | PRN
  Administered 2012-09-25 (×4): 80 ug via INTRAVENOUS

## 2012-09-25 MED ORDER — LORAZEPAM 2 MG/ML IJ SOLN
0.5000 mg | Freq: Once | INTRAMUSCULAR | Status: AC
  Administered 2012-09-25: 20:00:00 via INTRAVENOUS
  Filled 2012-09-25: qty 1

## 2012-09-25 MED ORDER — 0.9 % SODIUM CHLORIDE (POUR BTL) OPTIME
TOPICAL | Status: DC | PRN
  Administered 2012-09-25: 1000 mL

## 2012-09-25 MED ORDER — CEFAZOLIN SODIUM-DEXTROSE 2-3 GM-% IV SOLR
INTRAVENOUS | Status: DC | PRN
  Administered 2012-09-25: 2 g via INTRAVENOUS

## 2012-09-25 MED ORDER — HEMOSTATIC AGENTS (NO CHARGE) OPTIME
TOPICAL | Status: DC | PRN
  Administered 2012-09-25: 1 via TOPICAL

## 2012-09-25 MED ORDER — SUFENTANIL CITRATE 50 MCG/ML IV SOLN
INTRAVENOUS | Status: DC | PRN
Start: 2012-09-25 — End: 2012-09-26
  Administered 2012-09-25: 20 ug via INTRAVENOUS
  Administered 2012-09-25: 10 ug via INTRAVENOUS

## 2012-09-25 MED ORDER — ONDANSETRON HCL 4 MG/2ML IJ SOLN
4.0000 mg | Freq: Once | INTRAMUSCULAR | Status: AC
  Administered 2012-09-25: 4 mg via INTRAVENOUS
  Filled 2012-09-25: qty 2

## 2012-09-25 MED ORDER — MORPHINE SULFATE 4 MG/ML IJ SOLN
4.0000 mg | Freq: Once | INTRAMUSCULAR | Status: AC
Start: 2012-09-25 — End: 2012-09-25
  Administered 2012-09-25: 4 mg via INTRAVENOUS
  Filled 2012-09-25: qty 1

## 2012-09-25 MED ORDER — LIDOCAINE HCL (CARDIAC) 20 MG/ML IV SOLN
INTRAVENOUS | Status: DC | PRN
  Administered 2012-09-25: 100 mg via INTRAVENOUS

## 2012-09-25 MED ORDER — THROMBIN 5000 UNITS EX KIT
PACK | CUTANEOUS | Status: DC | PRN
  Administered 2012-09-25 (×2): 5000 [IU] via TOPICAL

## 2012-09-25 MED ORDER — LORAZEPAM 2 MG/ML IJ SOLN
1.0000 mg | Freq: Once | INTRAMUSCULAR | Status: DC
  Filled 2012-09-25: qty 1

## 2012-09-25 MED ORDER — ROCURONIUM BROMIDE 100 MG/10ML IV SOLN
INTRAVENOUS | Status: DC | PRN
  Administered 2012-09-25: 10 mg via INTRAVENOUS
  Administered 2012-09-25: 40 mg via INTRAVENOUS

## 2012-09-25 MED ORDER — SODIUM CHLORIDE 0.9 % IR SOLN
Status: DC | PRN
  Administered 2012-09-25: 23:00:00

## 2012-09-25 MED ORDER — BUPIVACAINE HCL (PF) 0.25 % IJ SOLN
INTRAMUSCULAR | Status: DC | PRN
  Administered 2012-09-25: 7 mL

## 2012-09-25 SURGICAL SUPPLY — 63 items
APL SKNCLS STERI-STRIP NONHPOA (GAUZE/BANDAGES/DRESSINGS) ×1
BAG DECANTER FOR FLEXI CONT (MISCELLANEOUS) ×2 IMPLANT
BENZOIN TINCTURE PRP APPL 2/3 (GAUZE/BANDAGES/DRESSINGS) ×2 IMPLANT
BIT DRILL MOUNTAINER FXED 12MM (DRILL) IMPLANT
BLADE SURG ROTATE 9660 (MISCELLANEOUS) IMPLANT
BUR MATCHSTICK NEURO 3.0 LAGG (BURR) ×2 IMPLANT
CANISTER SUCTION 2500CC (MISCELLANEOUS) ×2 IMPLANT
CLOTH BEACON ORANGE TIMEOUT ST (SAFETY) ×2 IMPLANT
CONT SPEC 4OZ CLIKSEAL STRL BL (MISCELLANEOUS) ×2 IMPLANT
DRAPE C-ARM 42X72 X-RAY (DRAPES) ×4 IMPLANT
DRAPE LAPAROTOMY 100X72 PEDS (DRAPES) ×2 IMPLANT
DRAPE POUCH INSTRU U-SHP 10X18 (DRAPES) ×2 IMPLANT
DRESSING TELFA 8X3 (GAUZE/BANDAGES/DRESSINGS) ×2 IMPLANT
DRILL MOUNTAINEER FIXED 12MM (DRILL) ×2
DRSG OPSITE 4X5.5 SM (GAUZE/BANDAGES/DRESSINGS) ×2 IMPLANT
DURAPREP 6ML APPLICATOR 50/CS (WOUND CARE) ×2 IMPLANT
ELECT REM PT RETURN 9FT ADLT (ELECTROSURGICAL) ×2
ELECTRODE REM PT RTRN 9FT ADLT (ELECTROSURGICAL) ×1 IMPLANT
EVACUATOR 1/8 PVC DRAIN (DRAIN) IMPLANT
GAUZE SPONGE 4X4 16PLY XRAY LF (GAUZE/BANDAGES/DRESSINGS) IMPLANT
GLOVE BIO SURGEON STRL SZ 6.5 (GLOVE) ×2 IMPLANT
GLOVE BIO SURGEON STRL SZ7 (GLOVE) ×1 IMPLANT
GLOVE BIO SURGEON STRL SZ8 (GLOVE) ×2 IMPLANT
GLOVE EXAM NITRILE LRG STRL (GLOVE) IMPLANT
GLOVE EXAM NITRILE MD LF STRL (GLOVE) IMPLANT
GLOVE EXAM NITRILE XL STR (GLOVE) IMPLANT
GLOVE EXAM NITRILE XS STR PU (GLOVE) IMPLANT
GLOVE INDICATOR 6.5 STRL GRN (GLOVE) ×1 IMPLANT
GOWN BRE IMP SLV AUR LG STRL (GOWN DISPOSABLE) ×2 IMPLANT
GOWN BRE IMP SLV AUR XL STRL (GOWN DISPOSABLE) ×2 IMPLANT
GOWN STRL REIN 2XL LVL4 (GOWN DISPOSABLE) IMPLANT
HEMOSTAT POWDER KIT SURGIFOAM (HEMOSTASIS) IMPLANT
KIT BASIN OR (CUSTOM PROCEDURE TRAY) ×2 IMPLANT
KIT ROOM TURNOVER OR (KITS) ×2 IMPLANT
MARKER SKIN DUAL TIP RULER LAB (MISCELLANEOUS) ×2 IMPLANT
NDL HYPO 18GX1.5 BLUNT FILL (NEEDLE) IMPLANT
NDL HYPO 25X1 1.5 SAFETY (NEEDLE) ×1 IMPLANT
NDL SPNL 20GX3.5 QUINCKE YW (NEEDLE) ×1 IMPLANT
NEEDLE HYPO 18GX1.5 BLUNT FILL (NEEDLE) IMPLANT
NEEDLE HYPO 25X1 1.5 SAFETY (NEEDLE) ×2 IMPLANT
NEEDLE SPNL 20GX3.5 QUINCKE YW (NEEDLE) ×2 IMPLANT
NS IRRIG 1000ML POUR BTL (IV SOLUTION) ×2 IMPLANT
PACK LAMINECTOMY NEURO (CUSTOM PROCEDURE TRAY) ×2 IMPLANT
PIN MAYFIELD SKULL DISP (PIN) ×2 IMPLANT
PUTTY BONE DBX 2.5 MIS (Bone Implant) ×1 IMPLANT
ROD MOUNTAINEER 3.5X60 (Rod) ×1 IMPLANT
SCREW F A 3.5X14 (Screw) ×6 IMPLANT
SCREW INNER (Screw) ×6 IMPLANT
SPONGE GAUZE 4X4 12PLY (GAUZE/BANDAGES/DRESSINGS) ×2 IMPLANT
SPONGE SURGIFOAM ABS GEL SZ50 (HEMOSTASIS) ×2 IMPLANT
STRIP CLOSURE SKIN 1/2X4 (GAUZE/BANDAGES/DRESSINGS) ×2 IMPLANT
SUT VIC AB 0 CT1 18XCR BRD8 (SUTURE) ×1 IMPLANT
SUT VIC AB 0 CT1 8-18 (SUTURE) ×2
SUT VIC AB 2-0 CP2 18 (SUTURE) ×2 IMPLANT
SUT VIC AB 3-0 SH 8-18 (SUTURE) ×2 IMPLANT
SYR 20ML ECCENTRIC (SYRINGE) ×2 IMPLANT
SYR 3ML LL SCALE MARK (SYRINGE) IMPLANT
TAPE STRIPS DRAPE STRL (GAUZE/BANDAGES/DRESSINGS) ×1 IMPLANT
TOWEL OR 17X24 6PK STRL BLUE (TOWEL DISPOSABLE) ×2 IMPLANT
TOWEL OR 17X26 10 PK STRL BLUE (TOWEL DISPOSABLE) ×2 IMPLANT
TRAY FOLEY CATH 14FRSI W/METER (CATHETERS) IMPLANT
UNDERPAD 30X30 INCONTINENT (UNDERPADS AND DIAPERS) ×2 IMPLANT
WATER STERILE IRR 1000ML POUR (IV SOLUTION) ×2 IMPLANT

## 2012-09-25 NOTE — ED Notes (Signed)
MD at bedside. Neuro consult
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
## 2012-09-25 NOTE — ED Notes (Signed)
Pt ready for transport to OR with RN. Family at bedside

## 2012-09-25 NOTE — ED Notes (Signed)
MD at bedside. Neuro consult at bedside

## 2012-09-25 NOTE — ED Notes (Signed)
Neurosurgeon has seen

## 2012-09-25 NOTE — ED Notes (Signed)
Operation consents signed by MD, Family, and RN

## 2012-09-25 NOTE — Anesthesia Preprocedure Evaluation (Addendum)
Anesthesia Evaluation  Patient identified by MRN, date of birth, ID band Patient confused    Reviewed: Allergy & Precautions, H&P , NPO status , Patient's Chart, lab work & pertinent test results  History of Anesthesia Complications Negative for: history of anesthetic complications  Airway Mallampati: II TM Distance: >3 FB Neck ROM: Limited  Mouth opening: Limited Mouth Opening  Dental  (+) Edentulous Upper and Edentulous Lower   Pulmonary shortness of breath and with exertion, former smoker,  Stopped 10-15 yrs ago   + decreased breath sounds      Cardiovascular hypertension, Pt. on medications + dysrhythmias Atrial Fibrillation Rhythm:Regular Rate:Normal     Neuro/Psych negative neurological ROS  negative psych ROS   GI/Hepatic Neg liver ROS, GERD-  Medicated and Controlled,  Endo/Other  diabetes, Type 2, Oral Hypoglycemic AgentsHypothyroidism   Renal/GU negative Renal ROS  negative genitourinary   Musculoskeletal negative musculoskeletal ROS (+)   Abdominal   Peds  Hematology negative hematology ROS (+)   Anesthesia Other Findings   Reproductive/Obstetrics                          Anesthesia Physical Anesthesia Plan  ASA: III and Emergent  Anesthesia Plan: General   Post-op Pain Management:    Induction: Rapid sequence, Cricoid pressure planned and Intravenous  Airway Management Planned: Oral ETT and Video Laryngoscope Planned  Additional Equipment:   Intra-op Plan:   Post-operative Plan: Extubation in OR  Informed Consent: I have reviewed the patients History and Physical, chart, labs and discussed the procedure including the risks, benefits and alternatives for the proposed anesthesia with the patient or authorized representative who has indicated his/her understanding and acceptance.     Plan Discussed with: CRNA, Anesthesiologist and Surgeon  Anesthesia Plan Comments:  (Fracture C3-4 with spinal cord injury with probable pre-existing spinal stenosis htn GERD Thrombocytopenia plts 95,000 Carotid stent Type 2 DM )       Anesthesia Quick Evaluation
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
## 2012-09-25 NOTE — OR Nursing (Signed)
Patient to OR in cervical collar. Patient complained of pain and despite anesthesia and nursing interventions, continued to attempt to move his neck and body, despite being told to remain still and the rationale for doing so.
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
## 2012-09-25 NOTE — ED Notes (Signed)
Family at the bedside.  The pt continues to c/o the c-collar

## 2012-09-25 NOTE — ED Notes (Signed)
Family at bedside. 

## 2012-09-26 ENCOUNTER — Encounter (HOSPITAL_COMMUNITY): Payer: Self-pay | Admitting: Pulmonary Disease

## 2012-09-26 ENCOUNTER — Inpatient Hospital Stay (HOSPITAL_COMMUNITY): Payer: PRIVATE HEALTH INSURANCE

## 2012-09-26 DIAGNOSIS — J95821 Acute postprocedural respiratory failure: Secondary | ICD-10-CM

## 2012-09-26 DIAGNOSIS — J9589 Other postprocedural complications and disorders of respiratory system, not elsewhere classified: Secondary | ICD-10-CM

## 2012-09-26 DIAGNOSIS — S129XXA Fracture of neck, unspecified, initial encounter: Secondary | ICD-10-CM

## 2012-09-26 DIAGNOSIS — E039 Hypothyroidism, unspecified: Secondary | ICD-10-CM

## 2012-09-26 DIAGNOSIS — F1011 Alcohol abuse, in remission: Secondary | ICD-10-CM

## 2012-09-26 DIAGNOSIS — E119 Type 2 diabetes mellitus without complications: Secondary | ICD-10-CM

## 2012-09-26 DIAGNOSIS — I10 Essential (primary) hypertension: Secondary | ICD-10-CM

## 2012-09-26 LAB — BLOOD GAS, ARTERIAL
Bicarbonate: 22.6 mEq/L (ref 20.0–24.0)
TCO2: 24 mmol/L (ref 0–100)
pCO2 arterial: 45.7 mmHg — ABNORMAL HIGH (ref 35.0–45.0)
pH, Arterial: 7.316 — ABNORMAL LOW (ref 7.350–7.450)
pO2, Arterial: 182 mmHg — ABNORMAL HIGH (ref 80.0–100.0)

## 2012-09-26 LAB — GLUCOSE, CAPILLARY

## 2012-09-26 LAB — MRSA PCR SCREENING: MRSA by PCR: NEGATIVE

## 2012-09-26 MED ORDER — SODIUM CHLORIDE 0.9 % IJ SOLN: 3.0000 mL | INTRAMUSCULAR | Status: DC | PRN

## 2012-09-26 MED ORDER — CEFAZOLIN SODIUM 1-5 GM-% IV SOLN
1.0000 g | Freq: Three times a day (TID) | INTRAVENOUS | Status: AC
  Administered 2012-09-26 (×2): 1 g via INTRAVENOUS
  Filled 2012-09-26 (×3): qty 50

## 2012-09-26 MED ORDER — SODIUM CHLORIDE 0.9 % IV SOLN: 250.0000 mL | INTRAVENOUS | Status: DC

## 2012-09-26 MED ORDER — FENTANYL CITRATE 0.05 MG/ML IJ SOLN
25.0000 ug | INTRAMUSCULAR | Status: DC | PRN
  Administered 2012-09-26: 25 ug via INTRAVENOUS
  Administered 2012-09-26 (×6): 50 ug via INTRAVENOUS
  Administered 2012-09-26: 25 ug via INTRAVENOUS
  Administered 2012-09-27 – 2012-09-29 (×6): 50 ug via INTRAVENOUS
  Administered 2012-09-30: 25 ug via INTRAVENOUS
  Administered 2012-09-30: 50 ug via INTRAVENOUS
  Administered 2012-09-30 – 2012-10-02 (×3): 25 ug via INTRAVENOUS
  Administered 2012-10-02 (×2): 50 ug via INTRAVENOUS
  Administered 2012-10-02 (×2): 25 ug via INTRAVENOUS
  Administered 2012-10-03: 50 ug via INTRAVENOUS
  Filled 2012-09-26 (×23): qty 2

## 2012-09-26 MED ORDER — LINAGLIPTIN 5 MG PO TABS
5.0000 mg | ORAL_TABLET | Freq: Every day | ORAL | Status: DC
  Filled 2012-09-26 (×2): qty 1

## 2012-09-26 MED ORDER — MENTHOL 3 MG MT LOZG: 1.0000 | LOZENGE | OROMUCOSAL | Status: DC | PRN

## 2012-09-26 MED ORDER — PROPOFOL 10 MG/ML IV EMUL
5.0000 ug/kg/min | INTRAVENOUS | Status: DC
  Administered 2012-09-26: 10 ug/kg/min via INTRAVENOUS
  Administered 2012-09-26: 25 ug/kg/min via INTRAVENOUS
  Filled 2012-09-26: qty 100

## 2012-09-26 MED ORDER — THIAMINE HCL 100 MG/ML IJ SOLN
INTRAVENOUS | Status: DC
  Administered 2012-09-26: 07:00:00 via INTRAVENOUS
  Filled 2012-09-26 (×12): qty 1000

## 2012-09-26 MED ORDER — NITROGLYCERIN 2 % TD OINT
1.0000 [in_us] | TOPICAL_OINTMENT | Freq: Four times a day (QID) | TRANSDERMAL | Status: DC | PRN
  Administered 2012-09-26 – 2012-09-28 (×5): 1 [in_us] via TOPICAL
  Filled 2012-09-26: qty 30

## 2012-09-26 MED ORDER — ACETAMINOPHEN 10 MG/ML IV SOLN
1000.0000 mg | Freq: Four times a day (QID) | INTRAVENOUS | Status: AC
  Administered 2012-09-26 (×4): 1000 mg via INTRAVENOUS
  Filled 2012-09-26 (×6): qty 100

## 2012-09-26 MED ORDER — ACETAMINOPHEN 650 MG RE SUPP: 650.0000 mg | RECTAL | Status: DC | PRN

## 2012-09-26 MED ORDER — ACETAMINOPHEN 325 MG PO TABS: 650.0000 mg | ORAL_TABLET | ORAL | Status: DC | PRN

## 2012-09-26 MED ORDER — FLUTICASONE-SALMETEROL 250-50 MCG/DOSE IN AEPB
1.0000 | INHALATION_SPRAY | Freq: Two times a day (BID) | RESPIRATORY_TRACT | Status: DC
  Filled 2012-09-26: qty 14

## 2012-09-26 MED ORDER — PANTOPRAZOLE SODIUM 40 MG IV SOLR
40.0000 mg | Freq: Every day | INTRAVENOUS | Status: DC
  Administered 2012-09-26 – 2012-10-03 (×7): 40 mg via INTRAVENOUS
  Filled 2012-09-26 (×8): qty 40

## 2012-09-26 MED ORDER — LABETALOL HCL 5 MG/ML IV SOLN
INTRAVENOUS | Status: AC
  Filled 2012-09-26: qty 4

## 2012-09-26 MED ORDER — LEVOTHYROXINE SODIUM 88 MCG PO TABS
88.0000 ug | ORAL_TABLET | Freq: Every day | ORAL | Status: DC
  Filled 2012-09-26 (×3): qty 1

## 2012-09-26 MED ORDER — LABETALOL HCL 5 MG/ML IV SOLN
5.0000 mg | INTRAVENOUS | Status: DC | PRN
  Administered 2012-09-26 – 2012-09-29 (×13): 5 mg via INTRAVENOUS
  Filled 2012-09-26 (×8): qty 4

## 2012-09-26 MED ORDER — CARVEDILOL 3.125 MG PO TABS
3.1250 mg | ORAL_TABLET | Freq: Two times a day (BID) | ORAL | Status: DC
  Filled 2012-09-26 (×3): qty 1

## 2012-09-26 MED ORDER — LISINOPRIL 10 MG PO TABS
10.0000 mg | ORAL_TABLET | Freq: Every day | ORAL | Status: DC
  Filled 2012-09-26 (×2): qty 1

## 2012-09-26 MED ORDER — MORPHINE SULFATE 2 MG/ML IJ SOLN
1.0000 mg | INTRAMUSCULAR | Status: DC | PRN
  Administered 2012-09-26 – 2012-09-27 (×5): 2 mg via INTRAVENOUS
  Administered 2012-09-28 (×4): 4 mg via INTRAVENOUS
  Filled 2012-09-26: qty 2
  Filled 2012-09-26 (×2): qty 1
  Filled 2012-09-26: qty 2
  Filled 2012-09-26 (×2): qty 1
  Filled 2012-09-26: qty 2
  Filled 2012-09-26: qty 1
  Filled 2012-09-26 (×2): qty 2

## 2012-09-26 MED ORDER — OXYCODONE HCL 5 MG PO TABS: 10.0000 mg | ORAL_TABLET | ORAL | Status: DC | PRN

## 2012-09-26 MED ORDER — FLUTICASONE-SALMETEROL 250-50 MCG/DOSE IN AEPB
1.0000 | INHALATION_SPRAY | Freq: Two times a day (BID) | RESPIRATORY_TRACT | Status: DC
  Administered 2012-09-27: 1 via RESPIRATORY_TRACT
  Filled 2012-09-26: qty 14

## 2012-09-26 MED ORDER — POTASSIUM CHLORIDE IN NACL 20-0.9 MEQ/L-% IV SOLN
INTRAVENOUS | Status: DC
  Administered 2012-09-26: 01:00:00 via INTRAVENOUS
  Filled 2012-09-26 (×2): qty 1000

## 2012-09-26 MED ORDER — PHENOL 1.4 % MT LIQD
1.0000 | OROMUCOSAL | Status: DC | PRN
  Administered 2012-09-27: 1 via OROMUCOSAL
  Filled 2012-09-26: qty 177

## 2012-09-26 MED ORDER — NEOSTIGMINE METHYLSULFATE 1 MG/ML IJ SOLN
INTRAMUSCULAR | Status: DC | PRN
  Administered 2012-09-26: 3 mg via INTRAVENOUS

## 2012-09-26 MED ORDER — ASPIRIN 81 MG PO CHEW: 81.0000 mg | CHEWABLE_TABLET | Freq: Every day | ORAL | Status: DC

## 2012-09-26 MED ORDER — SODIUM CHLORIDE 0.9 % IJ SOLN
3.0000 mL | Freq: Two times a day (BID) | INTRAMUSCULAR | Status: DC
  Administered 2012-09-26 – 2012-10-03 (×14): 3 mL via INTRAVENOUS

## 2012-09-26 MED ORDER — PROPOFOL 10 MG/ML IV EMUL
INTRAVENOUS | Status: AC
  Filled 2012-09-26: qty 100

## 2012-09-26 MED ORDER — METOPROLOL TARTRATE 1 MG/ML IV SOLN
2.5000 mg | Freq: Four times a day (QID) | INTRAVENOUS | Status: DC
  Administered 2012-09-26 – 2012-09-29 (×11): 2.5 mg via INTRAVENOUS
  Filled 2012-09-26 (×16): qty 5

## 2012-09-26 MED ORDER — CHLORHEXIDINE GLUCONATE 0.12 % MT SOLN
15.0000 mL | Freq: Two times a day (BID) | OROMUCOSAL | Status: DC
  Administered 2012-09-26 – 2012-09-30 (×9): 15 mL via OROMUCOSAL
  Filled 2012-09-26 (×8): qty 15

## 2012-09-26 MED ORDER — FENTANYL BOLUS VIA INFUSION
50.0000 ug | Freq: Four times a day (QID) | INTRAVENOUS | Status: DC | PRN
  Filled 2012-09-26: qty 100

## 2012-09-26 MED ORDER — INSULIN ASPART 100 UNIT/ML ~~LOC~~ SOLN
2.0000 [IU] | SUBCUTANEOUS | Status: DC
  Administered 2012-09-26 (×3): 4 [IU] via SUBCUTANEOUS
  Administered 2012-09-27 – 2012-10-02 (×10): 2 [IU] via SUBCUTANEOUS
  Administered 2012-10-02 (×4): 4 [IU] via SUBCUTANEOUS
  Administered 2012-10-03 (×2): 6 [IU] via SUBCUTANEOUS
  Administered 2012-10-03: 4 [IU] via SUBCUTANEOUS

## 2012-09-26 MED ORDER — CYCLOBENZAPRINE HCL 10 MG PO TABS
10.0000 mg | ORAL_TABLET | Freq: Three times a day (TID) | ORAL | Status: DC | PRN
  Filled 2012-09-26: qty 1

## 2012-09-26 MED ORDER — FLUTICASONE-SALMETEROL 250-50 MCG/DOSE IN AEPB: 1.0000 | INHALATION_SPRAY | Freq: Two times a day (BID) | RESPIRATORY_TRACT | Status: DC

## 2012-09-26 MED ORDER — SODIUM CHLORIDE 0.9 % IV SOLN
50.0000 ug/h | INTRAVENOUS | Status: DC
  Filled 2012-09-26: qty 50

## 2012-09-26 MED ORDER — FENTANYL CITRATE 0.05 MG/ML IJ SOLN
INTRAMUSCULAR | Status: AC
  Filled 2012-09-26: qty 2

## 2012-09-26 MED ORDER — SIMVASTATIN 20 MG PO TABS
20.0000 mg | ORAL_TABLET | Freq: Every day | ORAL | Status: DC
  Filled 2012-09-26 (×2): qty 1

## 2012-09-26 MED ORDER — ONDANSETRON HCL 4 MG/2ML IJ SOLN
4.0000 mg | INTRAMUSCULAR | Status: DC | PRN
  Administered 2012-09-26 – 2012-09-27 (×3): 4 mg via INTRAVENOUS
  Filled 2012-09-26 (×3): qty 2

## 2012-09-26 MED ORDER — SODIUM CHLORIDE 0.9 % IV SOLN
250.0000 mL | INTRAVENOUS | Status: DC
  Administered 2012-09-26 – 2012-09-27 (×2): 250 mL via INTRAVENOUS

## 2012-09-26 MED ORDER — IPRATROPIUM-ALBUTEROL 18-103 MCG/ACT IN AERO
6.0000 | INHALATION_SPRAY | RESPIRATORY_TRACT | Status: DC | PRN
  Administered 2012-09-26 – 2012-09-27 (×2): 6 via RESPIRATORY_TRACT
  Filled 2012-09-26: qty 14.7

## 2012-09-26 MED ORDER — BIOTENE DRY MOUTH MT LIQD
15.0000 mL | Freq: Four times a day (QID) | OROMUCOSAL | Status: DC
  Administered 2012-09-26 – 2012-10-03 (×30): 15 mL via OROMUCOSAL

## 2012-09-26 MED ORDER — GLYCOPYRROLATE 0.2 MG/ML IJ SOLN
INTRAMUSCULAR | Status: DC | PRN
  Administered 2012-09-26: 0.4 mg via INTRAVENOUS

## 2012-09-26 MED ORDER — INFLUENZA VIRUS VACC SPLIT PF IM SUSP
0.5000 mL | INTRAMUSCULAR | Status: AC
  Administered 2012-09-27: 0.5 mL via INTRAMUSCULAR
  Filled 2012-09-26: qty 0.5
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
## 2012-09-26 NOTE — Progress Notes (Signed)
Dr. Molli Knock notified of pt's failed swallow exam, orders received.  Will continue NPO for now, changed BP management to metoprolol IV, hold other oral meds pending swallow re-eval tomorrow.

## 2012-09-26 NOTE — ED Provider Notes (Signed)
Medical screening examination/treatment/procedure(s) were conducted as a shared visit with non-physician practitioner(s) and myself.  I personally evaluated the patient during the encounter  Discussed with Dr Yetta Barre who saw pt in ED and admitted for surgery  Loren Racer, MD 09/26/12 2305
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
## 2012-09-26 NOTE — Progress Notes (Signed)
Decreased vt as per tvo dr deterding. abg post 1hr.

## 2012-09-26 NOTE — Progress Notes (Signed)
Notified Dr. Darrick Penna regarding the need for maintence fluid orders. Received a order for NS@50 . Will continue to monitor.

## 2012-09-26 NOTE — Anesthesia Postprocedure Evaluation (Signed)
  Anesthesia Post-op Note  Patient: [name]  Procedure(s) Performed: Procedure(s) (LRB) with comments: POSTERIOR CERVICAL FUSION/FORAMINOTOMY LEVEL 2 (N/A) - Posterior Cervical three-four laminectomy, posterior cervical three-four, four-five fusion  Patient Location: PACU and ICU  Anesthesia Type: General  Level of Consciousness: awake  Airway and Oxygen Therapy: Patient Spontanous Breathing and Patient connected to nasal cannula oxygen  Post-op Pain: mild  Post-op Assessment: Post-op Vital signs reviewed  Post-op Vital Signs: Reviewed stable  Complications: No apparent anesthesia complications

## 2012-09-26 NOTE — Transfer of Care (Signed)
Immediate Anesthesia Transfer of Care Note  Patient: [name]  Procedure(s) Performed: Procedure(s) (LRB) with comments: POSTERIOR CERVICAL FUSION/FORAMINOTOMY LEVEL 2 (N/A) - Posterior Cervical three-four laminectomy, posterior cervical three-four, four-five fusion  Patient Location:Neuro ICU  Anesthesia Type: General  Level of Consciousness: sedated, unresponsive, responds to stimulation and Patient remains intubated per anesthesia plan  Airway & Oxygen Therapy: Patient remains intubated per anesthesia plan and Patient placed on Ventilator (see vital sign flow sheet for setting)  Post-op Assessment: Report given to Neuro ICU RN, Post -op Vital signs reviewed and stable and Patient moving all extremities  Post vital signs: Reviewed and stable  Complications: No apparent anesthesia complications
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
## 2012-09-26 NOTE — Plan of Care (Signed)
Problem: Consults Goal: Diagnosis - Spinal Surgery Outcome: Completed/Met Date Met:  09/26/12 Cervical Spine Fusion

## 2012-09-26 NOTE — Discharge Summary (Signed)
Physician Discharge Summary  Patient ID: [name] MRN: [id number] DOB/AGE: [birth year] 78 y.o.  Admit date: 09/25/2012 Discharge date: 09/26/2012  Admission Diagnoses:c56 stenosis  Discharge Diagnoses: same Active Problems:  DM (diabetes mellitus)  Hypertension  Hypothyroid  Cervical spine fracture  Respiratory failure, post-operative   Discharged Condition:no pain  Hospital Course: surgery  Consults: no  Significant Diagnostic Studies: mri  Treatments: cervical fusion  Discharge Exam: Blood pressure 161/50, pulse 68, temperature 99.1 F (37.3 C), temperature source Axillary, resp. rate 17, height 5\' 9"  (1.753 m), weight 63.504 kg (140 lb), SpO2 99.00%. No pain or weakness  Disposition: home by 4pm if stable     Medication List     As of 09/26/2012 12:52 PM    ASK your doctor about these medications         aspirin 81 MG tablet   Take 81 mg by mouth daily.      cyclobenzaprine 10 MG tablet   Commonly known as: FLEXERIL   Take 10 mg by mouth at bedtime as needed.      esomeprazole 40 MG capsule   Commonly known as: NEXIUM   Take 40 mg by mouth 2 (two) times daily.      Fluticasone-Salmeterol 250-50 MCG/DOSE Aepb   Commonly known as: ADVAIR   Inhale 2 puffs into the lungs daily.      levothyroxine 88 MCG tablet   Commonly known as: SYNTHROID, LEVOTHROID   Take 88 mcg by mouth daily.      lisinopril 10 MG tablet   Commonly known as: PRINIVIL,ZESTRIL   Take 10 mg by mouth daily.      Oxycodone HCl 10 MG Tabs   Take 10 mg by mouth every 4 (four) hours as needed.      TRADJENTA 5 MG Tabs tablet   Generic drug: linagliptin   Take 5 mg by mouth daily.         Signed: Karn Cassis 09/26/2012, 12:52 PM

## 2012-09-26 NOTE — Progress Notes (Signed)
Patient ID: [name], male   DOB: 1931-11-06, 78 y.o.   MRN: [id number] Pt still intuabted, but likely will be extubated in the next hr or so. Neuro exam a little better than pre-op. Moves LE well to command. Slight wiggle in L hand, wrist extension 2/5 Left, tricep 2-3/5. RUE has 2/5 hand, 3/5 tricep, 3/5 bicep. Wean to extubate. PT/OT. Will need rehab vs SNF.
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
## 2012-09-26 NOTE — Procedures (Signed)
Extubation Procedure Note  Patient Details:   Name: [name] DOB: Aug 23, 1931 MRN: [id number]   Pt extubated to [facility] per MD order after successful SBT.  Pt had + cuff leak, NIF -20, FVC 868.     Evaluation  O2 sats: stable throughout Complications: No apparent complications Patient did tolerate procedure well. Bilateral Breath Sounds: Clear;Diminished Suctioning: Airway Yes  Yaslene Lindamood Apple 09/26/2012, 10:05 AM

## 2012-09-26 NOTE — Progress Notes (Signed)
Decreased fio20, post abg

## 2012-09-26 NOTE — Progress Notes (Signed)
Settings changed and set by md.

## 2012-09-27 ENCOUNTER — Inpatient Hospital Stay (HOSPITAL_COMMUNITY): Payer: PRIVATE HEALTH INSURANCE

## 2012-09-27 LAB — CBC
Hemoglobin: 11.8 g/dL — ABNORMAL LOW (ref 13.0–17.0)
MCH: 31 pg (ref 26.0–34.0)
MCV: 91.6 fL (ref 78.0–100.0)
RBC: 3.81 MIL/uL — ABNORMAL LOW (ref 4.22–5.81)

## 2012-09-27 LAB — GLUCOSE, CAPILLARY
Glucose-Capillary: 136 mg/dL — ABNORMAL HIGH (ref 70–99)
Glucose-Capillary: 108 mg/dL — ABNORMAL HIGH (ref 70–99)
Glucose-Capillary: 92 mg/dL (ref 70–99)
Glucose-Capillary: 117 mg/dL — ABNORMAL HIGH (ref 70–99)

## 2012-09-27 LAB — BASIC METABOLIC PANEL
BUN: 23 mg/dL (ref 6–23)
CO2: 24 mEq/L (ref 19–32)
Calcium: 8.9 mg/dL (ref 8.4–10.5)
Creatinine, Ser: 0.7 mg/dL (ref 0.50–1.35)
Glucose, Bld: 126 mg/dL — ABNORMAL HIGH (ref 70–99)

## 2012-09-27 LAB — PHOSPHORUS: Phosphorus: 2.5 mg/dL (ref 2.3–4.6)

## 2012-09-27 MED ORDER — ALBUTEROL SULFATE (5 MG/ML) 0.5% IN NEBU
2.5000 mg | INHALATION_SOLUTION | Freq: Four times a day (QID) | RESPIRATORY_TRACT | Status: DC
  Administered 2012-09-27 – 2012-10-03 (×25): 2.5 mg via RESPIRATORY_TRACT
  Filled 2012-09-27 (×25): qty 0.5

## 2012-09-27 MED ORDER — GUAIFENESIN ER 600 MG PO TB12
600.0000 mg | ORAL_TABLET | Freq: Two times a day (BID) | ORAL | Status: DC
  Filled 2012-09-27 (×2): qty 1

## 2012-09-27 MED ORDER — SODIUM CHLORIDE 0.9 % IV SOLN
INTRAVENOUS | Status: DC
  Administered 2012-09-27 – 2012-09-30 (×3): via INTRAVENOUS
  Administered 2012-10-01 (×2): 50 mL/h via INTRAVENOUS
  Administered 2012-10-02: 17:00:00 via INTRAVENOUS

## 2012-09-27 MED ORDER — IPRATROPIUM BROMIDE 0.02 % IN SOLN
0.5000 mg | Freq: Four times a day (QID) | RESPIRATORY_TRACT | Status: DC
  Administered 2012-09-27 – 2012-10-03 (×25): 0.5 mg via RESPIRATORY_TRACT
  Filled 2012-09-27 (×26): qty 2.5

## 2012-09-27 MED ORDER — BUDESONIDE 0.25 MG/2ML IN SUSP
0.2500 mg | Freq: Four times a day (QID) | RESPIRATORY_TRACT | Status: DC
  Administered 2012-09-27: 0.25 mg via RESPIRATORY_TRACT
  Filled 2012-09-27 (×4): qty 2

## 2012-09-27 MED ORDER — BUDESONIDE 0.25 MG/2ML IN SUSP
0.2500 mg | Freq: Four times a day (QID) | RESPIRATORY_TRACT | Status: DC
  Administered 2012-09-27 – 2012-10-01 (×13): 0.25 mg via RESPIRATORY_TRACT
  Filled 2012-09-27 (×19): qty 2

## 2012-09-27 MED ORDER — LEVOTHYROXINE SODIUM 100 MCG IV SOLR
44.0000 ug | Freq: Every day | INTRAVENOUS | Status: DC
  Administered 2012-09-27 – 2012-10-03 (×6): 44 ug via INTRAVENOUS
  Filled 2012-09-27 (×7): qty 2.2

## 2012-09-27 MED ORDER — SODIUM CHLORIDE 0.9 % IV SOLN
1.5000 g | Freq: Four times a day (QID) | INTRAVENOUS | Status: DC
  Administered 2012-09-27 – 2012-10-03 (×25): 1.5 g via INTRAVENOUS
  Filled 2012-09-27 (×28): qty 1.5

## 2012-09-27 MED ORDER — ASPIRIN 300 MG RE SUPP
150.0000 mg | Freq: Every day | RECTAL | Status: DC
  Administered 2012-09-27 – 2012-10-03 (×6): 150 mg via RECTAL
  Filled 2012-09-27 (×9): qty 1

## 2012-09-27 MED ORDER — BUDESONIDE 0.25 MG/2ML IN SUSP: 0.2500 mg | Freq: Four times a day (QID) | RESPIRATORY_TRACT | Status: DC

## 2012-09-27 NOTE — Progress Notes (Signed)
Speech Language Pathology Dysphagia Treatment Patient Details Name: [name] MRN: [id number] DOB: 10-18-1931 Today's Date: 09/27/2012 Time: 1030-1100 SLP Time Calculation (min): 30 min  Assessment / Plan / Recommendation Clinical Impression  Purpose of diagnostic treatment to reassess swallow for PO readiness following initial BSE completed on 09/26/12.  PO trials limited due to continued severe oropharyngeal dysphagia with continued + s/s of aspiration immediately after swallow of trial ice chips and  thin water by spoon.  Recommend to continue  NPO status and reassess swallow  on 09/28/12 to determine readiness for completion of objective swallow evaluation.      Diet Recommendation  Continue with Current Diet: NPO    SLP Plan Continue with current plan of care      Swallowing Goals  SLP Swallowing Goals Swallow Study Goal #3 - Progress: Progressing toward goal  General Temperature Spikes Noted: No Respiratory Status: Other (comment) (nasal cannula)  Oral Cavity - Oral Hygiene Does patient have any of the following "at risk" factors?: Oxygen therapy - cannula, mask, simple oxygen devices;Other - dysphagia Patient is HIGH RISK - Oral Care Protocol followed (see row info): Yes Patient is AT RISK - Oral Care Protocol followed (see row info): Yes   Dysphagia Treatment Treatment focused on: Facilitation of pharyngeal phase;Patient/family/caregiver education Treatment Methods/Modalities: Skilled observation;Differential diagnosis Patient observed directly with PO's: Yes Type of PO's observed: Ice chips;Thin liquids Feeding: Total assist Liquids provided via: Teaspoon Pharyngeal Phase Signs & Symptoms: Suspected delayed swallow initiation;Multiple swallows;Audible swallow;Wet vocal quality;Immediate throat clear;Immediate cough;Changes in respirations Type of cueing: Verbal Amount of cueing: Moderate   GO  Moreen Fowler MS, CCC-SLP [phone]  [facility] 09/27/2012, 4:45  PM

## 2012-09-27 NOTE — Progress Notes (Signed)
Subjective: Patient notes continued significant weakness in upper extremities, left worse than right. Patient was extubated yesterday, but CCM's review of chest x-ray this morning shows new right lower lobe infiltrate. They've initiated treatment.  Objective: Vital signs in last 24 hours: Filed Vitals:   09/27/12 0400 09/27/12 0500 09/27/12 0537 09/27/12 0600  BP: 187/68 187/79  181/57  Pulse: 82 83  85  Temp: 98.4 F (36.9 C)     TempSrc: Oral     Resp: 21 17  19   Height:      Weight:      SpO2: 100% 98% 98% 98%    Intake/Output from previous day: 09/27 0701 - 09/28 0700 In: 1822 [I.V.:1570; IV Piggyback:252] Out: 2019 [Urine:1959; Drains:60] Intake/Output this shift:    Physical Exam:  Lower extremity strength is 5 over 5. Upper extremity exam shows 0-1 in the left deltoid and biceps, 2-3 in left triceps and intrinsics, and on the right side the deltoid is 0-1, biceps is to, and triceps and intrinsics are 2-3.  CBC  Basename 09/27/12 0638 09/25/12 1839  WBC 6.5 5.5  HGB 11.8* 12.1*  HCT 34.9* 34.0*  PLT 84* 95*   BMET  Basename 09/25/12 1839  NA 138  K 4.3  CL 103  CO2 24  GLUCOSE 156*  BUN 25*  CREATININE 0.97  CALCIUM 9.3    Assessment/Plan: Central cord syndrome, worse proximally than distally in upper extremities. Continuing PT and OT. Appreciate CCM's assistance. With worsening pulmonary picture patient needs to remain in the ICU.   Hewitt Shorts, MD 09/27/2012, 9:06 AM
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
## 2012-09-27 NOTE — Progress Notes (Signed)
Physical Therapy Treatment Patient Details Name: [name] MRN: [id number] DOB: August 24, 1931 Today's Date: 09/27/2012 Time: 6578-4696 PT Time Calculation (min): 17 min  PT Assessment / Plan / Recommendation Comments on Treatment Session  Pt limited by pain and medical complications including nausea, vomiting and bladder issues today. Pt refused mobility past EOB. Continue per plan in future sessions.     Follow Up Recommendations  Inpatient Rehab    Barriers to Discharge        Equipment Recommendations  Other (comment) (TBD)    Recommendations for Other Services Rehab consult  Frequency Min 5X/week   Plan Discharge plan remains appropriate;Frequency remains appropriate    Precautions / Restrictions Precautions Precautions: Cervical Required Braces or Orthoses: Cervical Brace Cervical Brace: Hard collar;Applied in supine position;Other (comment) Restrictions Weight Bearing Restrictions: No   Pertinent Vitals/Pain Pt with pain complaints in L shoulder 8/10    Mobility  Bed Mobility Bed Mobility: Rolling Left;Left Sidelying to Sit;Sitting - Scoot to [place] of Bed;Sit to Sidelying Left Rolling Left: 1: +2 Total assist Rolling Left: Patient Percentage: 30% Left Sidelying to Sit: 1: +2 Total assist;HOB elevated Left Sidelying to Sit: Patient Percentage: 20% Sitting - Scoot to Edge of Bed: 1: +2 Total assist Sitting - Scoot to Edge of Bed: Patient Percentage: 30% Sit to Sidelying Left: 1: +2 Total assist Sit to Sidelying Left: Patient Percentage: 30% Details for Bed Mobility Assistance: Pt able to use his trunk to assist with rolling but is unable to use UE or grip on rails to assist with transfer. VC for proper sequencing and use of LEs for transfer into sitting. Assist through trunk and pelvis for support. Pt required minimal assist at his LEs back into bed Transfers Transfers: Not assessed (pt refused secondary to pain) Ambulation/Gait Ambulation/Gait Assistance: Not  tested (comment)    Exercises     PT Diagnosis:    PT Problem List:   PT Treatment Interventions:     PT Goals Acute Rehab PT Goals PT Goal Formulation: With patient/family PT Goal: Rolling Supine to Right Side - Progress: Progressing toward goal PT Goal: Rolling Supine to Left Side - Progress: Progressing toward goal PT Goal: Supine/Side to Sit - Progress: Progressing toward goal PT Goal: Sit at Edge Of Bed - Progress: Progressing toward goal PT Goal: Sit to Supine/Side - Progress: Progressing toward goal  Visit Information  Last PT Received On: 09/27/12 Assistance Needed: +2    Subjective Data      Cognition  Overall Cognitive Status: Appears within functional limits for tasks assessed/performed Arousal/Alertness: Awake/alert    [employer] operational officer Sitting Balance Static Sitting - Balance Support: No upper extremity supported;Feet supported Static Sitting - Level of Assistance: 3: Mod assist Static Sitting - Comment/# of Minutes: Pt sitting at EOB for ~5 minutes with min-mod assist for support through trunk for upright posture. Pt required assist to maintain midline as he leaned forward throughout.  End of Session PT - End of Session Equipment Utilized During Treatment: Gait belt;Cervical collar Activity Tolerance: Patient limited by fatigue;Patient limited by pain Patient left: in bed;with call bell/phone within reach;with nursing in room Nurse Communication: Mobility status   GP     Milana Kidney 09/27/2012, 3:53 PM

## 2012-09-27 NOTE — Progress Notes (Signed)
Foley Replacement note: Initial Foley d/c'd per protocol 09/26/12 @ 15:40 (foley for perioperative use).  Pt unable to void adequately since Foley removed; required bladder scans and in/out catheterization x2 (last 09/27/12 @ 08:20).  9/28 @ 10:50 c/o bladder fullness & difficulty voiding; bladder scan = .  Foley replaced per protocol for urinary retention.

## 2012-09-28 ENCOUNTER — Inpatient Hospital Stay (HOSPITAL_COMMUNITY): Payer: PRIVATE HEALTH INSURANCE

## 2012-09-28 DIAGNOSIS — R29898 Other symptoms and signs involving the musculoskeletal system: Secondary | ICD-10-CM

## 2012-09-28 LAB — GLUCOSE, CAPILLARY
Glucose-Capillary: 101 mg/dL — ABNORMAL HIGH (ref 70–99)
Glucose-Capillary: 120 mg/dL — ABNORMAL HIGH (ref 70–99)
Glucose-Capillary: 128 mg/dL — ABNORMAL HIGH (ref 70–99)
Glucose-Capillary: 141 mg/dL — ABNORMAL HIGH (ref 70–99)
Glucose-Capillary: 96 mg/dL (ref 70–99)

## 2012-09-28 LAB — CBC
HCT: 37.1 % — ABNORMAL LOW (ref 39.0–52.0)
MCHC: 34.2 g/dL (ref 30.0–36.0)
RDW: 14.7 % (ref 11.5–15.5)
WBC: 6.4 10*3/uL (ref 4.0–10.5)

## 2012-09-28 LAB — BASIC METABOLIC PANEL
BUN: 24 mg/dL — ABNORMAL HIGH (ref 6–23)
Chloride: 99 mEq/L (ref 96–112)
GFR calc Af Amer: >90 mL/min (ref >90)
GFR calc non Af Amer: >90 mL/min (ref >90)
Potassium: 4.5 mEq/L (ref 3.5–5.1)
Sodium: 135 mEq/L (ref 135–145)

## 2012-09-28 LAB — PROCALCITONIN: Procalcitonin: 0.15 ng/mL

## 2012-09-28 MED ORDER — LORAZEPAM 2 MG/ML IJ SOLN
0.5000 mg | INTRAMUSCULAR | Status: DC | PRN
  Administered 2012-09-30: 0.5 mg via INTRAVENOUS
  Filled 2012-09-28: qty 1

## 2012-09-28 MED ORDER — FOLIC ACID 1 MG PO TABS: 1.0000 mg | ORAL_TABLET | Freq: Every day | ORAL | Status: DC

## 2012-09-28 MED ORDER — THIAMINE HCL 100 MG/ML IJ SOLN
100.0000 mg | Freq: Every day | INTRAMUSCULAR | Status: DC
  Administered 2012-09-28 – 2012-10-03 (×5): 100 mg via INTRAVENOUS
  Filled 2012-09-28 (×6): qty 1

## 2012-09-28 MED ORDER — FOLIC ACID 5 MG/ML IJ SOLN
1.0000 mg | Freq: Every day | INTRAMUSCULAR | Status: DC
  Administered 2012-09-28 – 2012-10-03 (×5): 1 mg via INTRAVENOUS
  Filled 2012-09-28 (×6): qty 0.2

## 2012-09-28 MED ORDER — LORAZEPAM 2 MG/ML IJ SOLN
1.0000 mg | Freq: Four times a day (QID) | INTRAMUSCULAR | Status: DC | PRN
  Administered 2012-09-28: 1 mg via INTRAVENOUS
  Filled 2012-09-28: qty 1

## 2012-09-28 MED ORDER — VITAMIN B-1 100 MG PO TABS: 100.0000 mg | ORAL_TABLET | Freq: Every day | ORAL | Status: DC

## 2012-09-28 MED ORDER — LORAZEPAM 2 MG/ML IJ SOLN: 0.0000 mg | Freq: Four times a day (QID) | INTRAMUSCULAR | Status: DC

## 2012-09-28 MED ORDER — LORAZEPAM 1 MG PO TABS: 1.0000 mg | ORAL_TABLET | Freq: Four times a day (QID) | ORAL | Status: DC | PRN

## 2012-09-28 MED ORDER — LORAZEPAM 2 MG/ML IJ SOLN: 0.0000 mg | Freq: Two times a day (BID) | INTRAMUSCULAR | Status: DC

## 2012-09-28 MED ORDER — ADULT MULTIVITAMIN W/MINERALS CH: 1.0000 | ORAL_TABLET | Freq: Every day | ORAL | Status: DC

## 2012-09-28 NOTE — Progress Notes (Signed)
Orthopedic Tech Progress Note Patient Details:  [name] 1931-08-26 [id number]  Patient ID: [name], male   DOB: 1931/12/28, 78 y.o.   MRN: [id number] Confirmed pt has collar.   Cristol Engdahl T 09/28/2012, 1:05 PM

## 2012-09-28 NOTE — Progress Notes (Signed)
Subjective:  Patient resting comfortably in bed. Nursing notes little in the way of drainage into Hemovac.  Objective: Vital signs in last 24 hours: Filed Vitals:   09/28/12 0500 09/28/12 0600 09/28/12 0700 09/28/12 0742  BP: 171/67 145/50 149/47   Pulse: 78 68 69   Temp:      TempSrc:      Resp: 20 18 15    Height:      Weight:      SpO2: 97% 98% 93% 97%    Intake/Output from previous day: 09/28 0701 - 09/29 0700 In: 510 [I.V.:410; IV Piggyback:100] Out: 2800 [Urine:2750; Drains:50] Intake/Output this shift:    Physical Exam:  Neurologically without change as compared to yesterday's exam.  CBC  Basename 09/28/12 0545 09/27/12 0638  WBC 6.4 6.5  HGB 12.7* 11.8*  HCT 37.1* 34.9*  PLT 106* 84*   BMET  Basename 09/28/12 0545 09/27/12 0638  NA 135 138  K 4.5 4.7  CL 99 103  CO2 25 24  GLUCOSE 130* 126*  BUN 24* 23  CREATININE 0.63 0.70  CALCIUM 9.1 8.9    Assessment/Plan: We'll have nursing staff change dressing and remove drain. Continue PT and OT. Prognosis uncertain for upper extremity motor recovery.   Hewitt Shorts, MD 09/28/2012, 8:46 AM

## 2012-09-28 NOTE — Progress Notes (Signed)
Speech Language Pathology Dysphagia Treatment Patient Details Name: [name] MRN: [id number] DOB: [birth year] Today's Date: 09/28/2012 Time: 1630-1700 SLP Time Calculation (min): 30 min  Assessment / Plan / Recommendation Clinical Impression  Purpose of diagnostic treatment to reassess swallow for readiness to complete objective evaluation.  Diagnostic treament deferred to later in afternoon as patient with decreased LOA this morning per RN.  Patient with continued immediate +s/s of aspiration during and after swallow with trials of thin water by spoon.  Patient's spouse present during treatment and reported patient wth history of dysphagia and had MBS completed one [facility].  Spouse stated that patient avoids certain textures as usually "meat will not go down".  Recommend to continue NPO status and proceed wth MBS to be completed on 09/29/12 to assess risk for aspiration.     Diet Recommendation  Continue with Current Diet: NPO    SLP Plan MBS;New goals to be determined pending objective testing      Swallowing Goals  SLP Swallowing Goals Swallow Study Goal #3 - Progress: Progressing toward goal  General Temperature Spikes Noted: No Respiratory Status: Other (comment) (nasal cannula) Behavior/Cognition: Alert;Cooperative;Pleasant mood;Decreased sustained attention Oral Cavity - Dentition: Edentulous Patient Positioning: Upright in bed  Oral Cavity - Oral Hygiene Does patient have any of the following "at risk" factors?: Other - dysphagia;Nutritional status - inadequate;Oxygen therapy - cannula, mask, simple oxygen devices Patient is HIGH RISK - Oral Care Protocol followed (see row info): Yes Patient is AT RISK - Oral Care Protocol followed (see row info): Yes   Dysphagia Treatment Treatment focused on: Facilitation of pharyngeal phase;Patient/family/caregiver education Family/Caregiver Educated: Spouse Treatment Methods/Modalities: Skilled  observation;Differential diagnosis Patient observed directly with PO's: Yes Type of PO's observed: Ice chips;Thin liquids Feeding: Needs assist Liquids provided via: Teaspoon Pharyngeal Phase Signs & Symptoms: Suspected delayed swallow initiation;Multiple swallows;Audible swallow;Wet vocal quality Type of cueing: Verbal Amount of cueing: Moderate   GO    Moreen Fowler MS, CCC-SLP [phone] [facility] 09/28/2012, 5:23 PM

## 2012-09-28 NOTE — Progress Notes (Addendum)
Clinical Social Work Department CLINICAL SOCIAL WORK PLACEMENT NOTE 09/28/2012  Patient:  [name], [name]  Account Number:  [id number] Admit date:  09/25/2012  Clinical Social Worker: Lia Foyer LCSWA ,  Date/time:  09/28/2012 01:10 PM  Clinical Social Work is seeking post-discharge placement for this patient at the following level of care:   SKILLED NURSING   (*CSW will update this form in Epic as items are completed)   09/28/2012  Patient/family provided with [facility] Department of Clinical Social Work's list of facilities offering this level of care within the geographic area requested by the patient (or if unable, by the patient's family).  09/28/2012  Patient/family informed of their freedom to choose among providers that offer the needed level of care, that participate in Medicare, Medicaid or managed care program needed by the patient, have an available bed and are willing to accept the patient.  09/28/2012  Patient/family informed of [facility]' ownership interest in [facility], as well as of the fact that they are under no obligation to receive care at this facility.  PASARR submitted to EDS on 09/28/2012 PASARR number received from EDS on 09/28/2012  FL2 transmitted to all facilities in geographic area requested by pt/family on  09/28/2012 FL2 transmitted to all facilities within larger geographic area on   Patient informed that his/her managed care company has contracts with or will negotiate with  certain facilities, including the following:     Patient/family informed of bed offers received:   Patient chooses bed at  Physician recommends and patient chooses bed at    Patient to be transferred to  on   Patient to be transferred to facility by   The following physician request were entered in Epic:    Additional Comments: [facility] bed search  10/03/2012- Pt accepted by [employer] and insurance approval received. Plan is for pt to transfer to CIR  today 10/4. CSW signed off.   Lia Foyer, LCSWA [facility] Clinical Social Worker Contact #: [phone] (weekend)

## 2012-09-28 NOTE — Progress Notes (Signed)
Clinical Social Work Department BRIEF PSYCHOSOCIAL ASSESSMENT 09/28/2012  Patient:  [name], [name]     Account Number:  [id number]     Admit date:  09/25/2012  Clinical Social Worker: [name] LCSWA ,  Date/Time:  09/28/2012 12:58 PM  Referred by:    Date Referred:  09/26/2012 Referred for  SNF Placement   Other Referral:   Interview type:  Family Other interview type:   chart review  charge nurse    PSYCHOSOCIAL DATA Living Status:  ALONE Primary support name:  [name] (grandson) Primary support relationship to patient:  FAMILY Degree of support available:   Vested, strong, at patient's bedside.    CURRENT CONCERNS Current Concerns  Post-Acute Placement  Other - See comment   Other Concerns:   Advanced Directives    SOCIAL WORK ASSESSMENT / PLAN CSW consulted by RN re: SNF placement as a back up to CIR. CSW spoke to patient and his grandson, [name] [phone]) re: SNF placement. The patient did not actively participate in the assessment, due to being tired. The patient's grandson stated the patient was living on his own, working everyday prior to this hospital admission. The grandson appeared upset by the patient's current medical condition, and asked extensive questions on prognosis. CSW provided support and encouraged the grandson to ask the nurse or doctor his questions. Grandson stated he hoped CIR would be the d/c plan but agreed to a SNF search for [facility] as a back up. CSW provided the grandson with the [facility] list.    [name] also disclosed his frustration re: patient's daughter who he stated is not involved in his care. Grandson inquired about being HCPOA and CSW stated that could be a discussion with the patient when he is fully alert and oriented. At this time, the daughter is next of kin, and CSW encouraged the grandson to speak to the daughter around the patient's care. Weekday CSW will continue to follow this patient.   Assessment/plan  status:  Information/Referral to [facility] Other assessment/ plan:   Information/referral to community resources:    PATIENT'S/FAMILY'S RESPONSE TO PLAN OF CARE: The patient's grandson thanked CSW for provided patient education, and support. Patient's grandson is agreeable to d/c plan.    [name], LCSWA [facility] Clinical Social Worker Contact #: [phone] (weekend)
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
## 2012-09-29 ENCOUNTER — Inpatient Hospital Stay (HOSPITAL_COMMUNITY): Payer: PRIVATE HEALTH INSURANCE

## 2012-09-29 DIAGNOSIS — IMO0002 Reserved for concepts with insufficient information to code with codable children: Secondary | ICD-10-CM

## 2012-09-29 LAB — BASIC METABOLIC PANEL
CO2: 25 mEq/L (ref 19–32)
Calcium: 9.1 mg/dL (ref 8.4–10.5)
Creatinine, Ser: 0.58 mg/dL (ref 0.50–1.35)
GFR calc Af Amer: >90 mL/min (ref >90)
GFR calc non Af Amer: >90 mL/min (ref >90)
Sodium: 134 mEq/L — ABNORMAL LOW (ref 135–145)

## 2012-09-29 LAB — CBC
MCH: 31.2 pg (ref 26.0–34.0)
MCHC: 34.2 g/dL (ref 30.0–36.0)
MCV: 91.2 fL (ref 78.0–100.0)
Platelets: 94 10*3/uL — ABNORMAL LOW (ref 150–400)
RBC: 4.07 MIL/uL — ABNORMAL LOW (ref 4.22–5.81)
RDW: 14.5 % (ref 11.5–15.5)

## 2012-09-29 LAB — GLUCOSE, CAPILLARY
Glucose-Capillary: 112 mg/dL — ABNORMAL HIGH (ref 70–99)
Glucose-Capillary: 129 mg/dL — ABNORMAL HIGH (ref 70–99)

## 2012-09-29 MED ORDER — METOPROLOL TARTRATE 1 MG/ML IV SOLN
5.0000 mg | Freq: Three times a day (TID) | INTRAVENOUS | Status: DC
  Administered 2012-09-29 – 2012-10-03 (×13): 5 mg via INTRAVENOUS
  Filled 2012-09-29 (×12): qty 5

## 2012-09-29 MED ORDER — LABETALOL HCL 5 MG/ML IV SOLN
10.0000 mg | INTRAVENOUS | Status: DC | PRN
  Administered 2012-09-29 – 2012-10-02 (×11): 20 mg via INTRAVENOUS
  Filled 2012-09-29 (×11): qty 4
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
## 2012-09-29 NOTE — Progress Notes (Signed)
Physical Therapy Treatment Patient Details Name: [name] MRN: [id number] DOB: 05-27-1931 Today's Date: 09/29/2012 Time: 4098-1191 PT Time Calculation (min): 24 min  PT Assessment / Plan / Recommendation Comments on Treatment Session  pt limited by pain and fatigue.  With 2 person HHA/ Body approximation at hips able to support pt's body enough for him to ambulate    Follow Up Recommendations  Post acute inpatient rehab    Barriers to Discharge        Equipment Recommendations  Other (comment) (TBD as pt progress and we have a better idea of UE function)    Recommendations for Other Services Rehab consult  Frequency Min 5X/week   Plan Discharge plan remains appropriate;Frequency remains appropriate    Precautions / Restrictions Precautions Precautions: Cervical;Fall Required Braces or Orthoses: Cervical Brace Cervical Brace: Hard collar;Applied in supine position;Other (comment) Restrictions Weight Bearing Restrictions: No   Pertinent Vitals/Pain     Mobility  Bed Mobility Bed Mobility: Rolling Left;Left Sidelying to Sit;Sitting - Scoot to [place] of Bed Rolling Right: 1: +2 Total assist Rolling Right: Patient Percentage: 40% Rolling Left: 3: Mod assist Rolling Left: Patient Percentage: 40% Left Sidelying to Sit: 1: +2 Total assist;HOB flat Left Sidelying to Sit: Patient Percentage: 40% Sitting - Scoot to Edge of Bed: 2: Max assist Scooting to [facility]: 1: +2 Total assist Scooting to [facility]: Patient Percentage: 0% Details for Bed Mobility Assistance: vc's for technique; significant truncal assist Transfers Transfers: Sit to Stand;Stand to Sit Sit to Stand: 1: +2 Total assist;From bed Sit to Stand: Patient Percentage: 60% Stand to Sit: 1: +2 Total assist;To chair/3-in-1 Stand to Sit: Patient Percentage: 60% Details for Transfer Assistance: assist for stability and coming forward over his BOS Ambulation/Gait Ambulation/Gait Assistance: 1: +2 Total  assist Ambulation/Gait: Patient Percentage: 60% Ambulation Distance (Feet): 18 Feet Assistive device: 2 person hand held assist Ambulation/Gait Assistance Details: unsteady gait with mild scissoring and truncal/pelvic instability needing body approximation to control movements Gait Pattern: Step-through pattern;Decreased step length - right;Decreased step length - left;Decreased stride length;Scissoring;Ataxic Stairs: No Wheelchair Mobility Wheelchair Mobility: No    Exercises General Exercises - Upper Extremity Shoulder Flexion: AAROM;10 reps;Supine;Left Shoulder ABduction: 10 reps;Supine;AAROM Shoulder ADduction: AAROM;10 reps;Supine Shoulder Horizontal ABduction: AAROM;10 reps;Supine Shoulder Horizontal ADduction: AAROM;10 reps;Supine Elbow Flexion: AAROM;10 reps;Supine Elbow Extension: AAROM;10 reps;Supine Wrist Flexion: AAROM;10 reps;Supine Wrist Extension: AAROM;10 reps;Supine Digit Composite Flexion: AAROM;10 reps;Supine Composite Extension: AAROM;10 reps;Supine   PT Diagnosis:    PT Problem List:   PT Treatment Interventions:     PT Goals Acute Rehab PT Goals Time For Goal Achievement: 10/10/12 Potential to Achieve Goals: Good PT Goal: Rolling Supine to Right Side - Progress: Progressing toward goal PT Goal: Rolling Supine to Left Side - Progress: Progressing toward goal PT Goal: Supine/Side to Sit - Progress: Progressing toward goal PT Goal: Sit at Edge Of Bed - Progress: Progressing toward goal PT Goal: Sit to Supine/Side - Progress: Progressing toward goal PT Goal: Sit to Stand - Progress: Progressing toward goal PT Goal: Stand to Sit - Progress: Progressing toward goal PT Transfer Goal: Bed to Chair/Chair to Bed - Progress: Progressing toward goal PT Goal: Ambulate - Progress: Progressing toward goal  Visit Information  Last PT Received On: 09/29/12 Assistance Needed: +2    Subjective Data  Subjective: This neck brace hurts Patient Stated Goal: to go home    Cognition  Overall Cognitive Status: Impaired Area of Impairment: Safety/judgement;Awareness of deficits Arousal/Alertness: Awake/alert Orientation Level: Appears intact for tasks assessed  Behavior During Session: Restless Safety/Judgement: Decreased awareness of safety precautions    Balance  Balance Balance Assessed: Yes Static Sitting Balance Static Sitting - Balance Support: No upper extremity supported;Feet supported Static Sitting - Level of Assistance: 4: Min assist Static Sitting - Comment/# of Minutes: 4  End of Session PT - End of Session Equipment Utilized During Treatment: Gait belt;Cervical collar Activity Tolerance: Patient limited by fatigue;Patient tolerated treatment well Patient left: in chair;with call bell/phone within reach Nurse Communication: Mobility status   GP     Takayla Baillie, Eliseo Gum 09/29/2012, 3:58 PM  09/29/2012  [place], PT [phone] (pager)
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
## 2012-09-29 NOTE — Progress Notes (Addendum)
I will clarify with daughter what available assistance pt will have a d/c. I will also begin insurance authorization for a possible inpt rehab admission.  [phone]
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
## 2012-09-30 LAB — GLUCOSE, CAPILLARY
Glucose-Capillary: 119 mg/dL — ABNORMAL HIGH (ref 70–99)
Glucose-Capillary: 121 mg/dL — ABNORMAL HIGH (ref 70–99)
Glucose-Capillary: 97 mg/dL (ref 70–99)
Glucose-Capillary: 99 mg/dL (ref 70–99)

## 2012-09-30 LAB — CBC
HCT: 35.1 % — ABNORMAL LOW (ref 39.0–52.0)
Hemoglobin: 12 g/dL — ABNORMAL LOW (ref 13.0–17.0)
MCH: 31 pg (ref 26.0–34.0)
MCV: 90.7 fL (ref 78.0–100.0)
RBC: 3.87 MIL/uL — ABNORMAL LOW (ref 4.22–5.81)
WBC: 6.3 10*3/uL (ref 4.0–10.5)

## 2012-09-30 LAB — URINALYSIS, ROUTINE W REFLEX MICROSCOPIC
Hgb urine dipstick: NEGATIVE
Nitrite: NEGATIVE
Protein, ur: NEGATIVE mg/dL
Specific Gravity, Urine: 1.028 (ref 1.005–1.030)
Urobilinogen, UA: 1 mg/dL (ref 0.0–1.0)

## 2012-09-30 LAB — BASIC METABOLIC PANEL
CO2: 26 mEq/L (ref 19–32)
Chloride: 105 mEq/L (ref 96–112)
Glucose, Bld: 126 mg/dL — ABNORMAL HIGH (ref 70–99)
Potassium: 3.8 mEq/L (ref 3.5–5.1)
Sodium: 144 mEq/L (ref 135–145)

## 2012-09-30 MED ORDER — HALOPERIDOL LACTATE 5 MG/ML IJ SOLN
1.0000 mg | INTRAMUSCULAR | Status: DC | PRN
  Administered 2012-10-01: 4 mg via INTRAVENOUS
  Filled 2012-09-30: qty 1
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
## 2012-09-30 NOTE — Progress Notes (Signed)
eLink Physician-Brief Progress Note Patient Name: [name] DOB: [birth year] MRN: [id number]  Date of Service  09/30/2012   HPI/Events of Note  Larey Seat out of bed. No inuries per RN   eICU Interventions  Monitor Waist belt posey ordered by Dr Walker Shadow per eRN   Intervention Category Minor Interventions: Other:  Breshae Belcher 09/30/2012, 5:53 AM

## 2012-09-30 NOTE — Progress Notes (Signed)
Occupational Therapy Treatment Patient Details Name: [name] MRN: [id number] DOB: [birth year] Today's Date: 09/30/2012 Time: 7829-5621 OT Time Calculation (min): 35 min  OT Assessment / Plan / Recommendation Comments on Treatment Session Pt with increased trunk control and balance compared to previous session.  Still with decreased bilateral UE strength and functional use but appears to have greater strength and movement on the left today.  Feel he lacks good awareness of his situation.  Will need 24 hour assist at discharge however he feels that he could return home without it.      Follow Up Recommendations  Inpatient Rehab    Barriers to Discharge       Equipment Recommendations  3 in 1 bedside comode;Tub/shower seat    Recommendations for Other Services Rehab consult  Frequency Min 2X/week   Plan Discharge plan remains appropriate    Precautions / Restrictions Precautions Precautions: Fall Required Braces or Orthoses: Cervical Brace Cervical Brace: Hard collar Restrictions Weight Bearing Restrictions: No   Pertinent Vitals/Pain Stable,  O2 remained greater than 93 % on room air with mobility and grooming tasks    ADL  Grooming: Performed;Moderate assistance;Other (comment);Wash/dry face (wash his face with the LUE only.) Where Assessed - Grooming: Unsupported sitting Toilet Transfer: Simulated;Moderate assistance Toilet Transfer Method: Stand pivot Toilet Transfer Equipment: Other (comment) (simulated to bedside chair) Equipment Used: Rolling walker Transfers/Ambulation Related to ADLs: Pt required total assist +2 (pt 50%) for mobility around the room and outside the door and back.  Utilized Rw for part of mobility with max assist to advance it. ADL Comments: Pt hesitant to use his UEs when asked.  Needed mod facilitation for bringing his right arm up to his face for washing.  Able to initiate some active movement in the left UE as well to push against therapist's  hand.  Noted pt able to elicti 70% of full gross finger flexion on the left hand and 20% of digit extension.        Visit Information  Last OT Received On: 09/30/12    Subjective Data  Subjective: "I can't!"  when asked to attempt washing his face Patient Stated Goal: Pt wants to go home.      Cognition  Overall Cognitive Status: Impaired Area of Impairment: Safety/judgement;Awareness of deficits Arousal/Alertness: Awake/alert Orientation Level: Disoriented to;Time;Place Behavior During Session: Restless Safety/Judgement: Decreased awareness of safety precautions Safety/Judgement - Other Comments: Pt feels he is safe to go back home with PRN assistance.    Mobility  Shoulder Instructions Bed Mobility Bed Mobility: Rolling Left;Left Sidelying to Sit;Supine to Sit       Exercises  General Exercises - Upper Extremity Shoulder Flexion: AAROM;Both;10 reps;Seated   Balance Balance Balance Assessed: Yes Static Sitting Balance Static Sitting - Balance Support: No upper extremity supported;Feet supported Static Sitting - Level of Assistance: 4: Min assist Dynamic Standing Balance Dynamic Standing - Balance Support: Right upper extremity supported;Left upper extremity supported Dynamic Standing - Level of Assistance: 3: Mod assist   End of Session OT - End of Session Activity Tolerance: Patient limited by fatigue Patient left: in chair;with call bell/phone within reach;with family/visitor present Nurse Communication: Mobility status     Jahniyah Revere OTR/L Pager number [phone] 09/30/2012, 11:44 AM

## 2012-09-30 NOTE — Progress Notes (Signed)
At approximately 0530 on the morning of 09/30/12 the patient slid to the bottom of the bed and fell to a sit on the floor. The bed was in it's lowest position. Nursing staff assessed the patient and helped him back to bed. The patient has no new or increased pain, vital signs are consistent with pre-fall values. There is no change in the patients neuro status. CCM notified. Order for waist belt restraint obtained. Family was notified.

## 2012-09-30 NOTE — Progress Notes (Addendum)
I have left a message for [name], daughter, to contact me to discuss pt care needs after d/c. I await her call back. [phone] [name] would like pt to come to stay with her temporarily until he is able to care for himself. She and her family can provide 24/7 care at home. Patient states he wants to go home and he will be all right. [name] will have to convince him to come live with her. I will begin pre certification with insurance for a possible inpt rehab admission. [name] states she is a half sister, [name], in [place] but she is not involved in his care. Patient is a widower, wife [name] is deceased. Please call me with any questions.
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
## 2012-09-30 NOTE — Progress Notes (Signed)
Physical Therapy Treatment Patient Details Name: ESSIE GEHRET MRN: [id number] DOB: 03-18-1931 Today's Date: 09/30/2012 Time: 7846-9629 PT Time Calculation (min): 35 min  PT Assessment / Plan / Recommendation Comments on Treatment Session  Making improvements in truncal stability.  UE's remain weak and uncoordinated, but improving    Follow Up Recommendations       Barriers to Discharge        Equipment Recommendations  3 in 1 bedside comode;Tub/shower seat    Recommendations for Other Services    Frequency Min 5X/week   Plan Discharge plan remains appropriate;Frequency remains appropriate    Precautions / Restrictions Precautions Precautions: Fall Required Braces or Orthoses: Cervical Brace Cervical Brace: Hard collar Restrictions Weight Bearing Restrictions: No   Pertinent Vitals/Pain     Mobility  Bed Mobility Bed Mobility: Rolling Left;Left Sidelying to Sit;Supine to Sit Rolling Left: 3: Mod assist Left Sidelying to Sit: 3: Mod assist Sitting - Scoot to Edge of Bed: 3: Mod assist Details for Bed Mobility Assistance: vc's for technique, truncal assist to sit up, but better initiation today Transfers Transfers: Sit to Stand;Stand to Sit Sit to Stand: 3: Mod assist;From bed Stand to Sit: 3: Mod assist;To chair/3-in-1 Details for Transfer Assistance: assist for stability and coming forward over his BOS Ambulation/Gait Ambulation/Gait Assistance: 1: +2 Total assist Ambulation/Gait: Patient Percentage: 60% Ambulation Distance (Feet): 32 Feet Assistive device: 2 person hand held assist;Rolling walker;Other (Comment) (RW helped to control pt better) Ambulation/Gait Assistance Details: unsteady gait with bil weak knees and trunk/pelvis instability continued, but improved. Gait Pattern: Step-through pattern;Decreased step length - right;Decreased step length - left;Decreased stride length;Scissoring;Ataxic Stairs: No Wheelchair Mobility Wheelchair Mobility: No      Exercises General Exercises - Upper Extremity Shoulder Flexion: AAROM;Both;10 reps;Seated General Exercises - Lower Extremity Heel Slides: AROM;Strengthening;Both;10 reps   PT Diagnosis:    PT Problem List:   PT Treatment Interventions:     PT Goals Acute Rehab PT Goals Time For Goal Achievement: 10/10/12 PT Goal: Rolling Supine to Left Side - Progress: Progressing toward goal PT Goal: Supine/Side to Sit - Progress: Progressing toward goal PT Goal: Sit at Edge Of Bed - Progress: Progressing toward goal PT Goal: Sit to Stand - Progress: Met PT Transfer Goal: Bed to Chair/Chair to Bed - Progress: Progressing toward goal PT Goal: Ambulate - Progress: Progressing toward goal  Visit Information  Last PT Received On: 09/30/12 Assistance Needed: +2 PT/OT Co-Evaluation/Treatment: Yes    Subjective Data  Subjective: Get this thing off  BP cuff   Cognition  Overall Cognitive Status: Impaired Area of Impairment: Safety/judgement;Awareness of deficits Arousal/Alertness: Awake/alert Orientation Level: Disoriented to;Time;Place Behavior During Session: Restless Safety/Judgement: Decreased awareness of safety precautions Safety/Judgement - Other Comments: Pt feels he is safe to go back home with PRN assistance.    Balance  Balance Balance Assessed: Yes Static Sitting Balance Static Sitting - Balance Support: No upper extremity supported;Feet supported Static Sitting - Level of Assistance: 4: Min assist Static Sitting - Comment/# of Minutes: >5 minutes, challenging balance Static Standing Balance Static Standing - Balance Support: Bilateral upper extremity supported;During functional activity Static Standing - Level of Assistance: 3: Mod assist Dynamic Standing Balance Dynamic Standing - Balance Support: Right upper extremity supported;Left upper extremity supported Dynamic Standing - Level of Assistance: 3: Mod assist  End of Session PT - End of Session Activity Tolerance:  Patient limited by fatigue;Patient tolerated treatment well Patient left: in chair;with call bell/phone within reach Nurse Communication: Mobility status  GP     Millena Callins, Eliseo Gum 09/30/2012, 11:51 AM
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
## 2012-10-01 ENCOUNTER — Encounter (HOSPITAL_COMMUNITY)
Admission: EM | Disposition: A | Discharge status: Rehabilitation Facility | Payer: Self-pay | Source: Home / Self Care | Attending: Neurological Surgery

## 2012-10-01 ENCOUNTER — Encounter (HOSPITAL_COMMUNITY): Payer: Self-pay | Admitting: *Deleted

## 2012-10-01 DIAGNOSIS — R1310 Dysphagia, unspecified: Secondary | ICD-10-CM

## 2012-10-01 HISTORY — PX: ESOPHAGOGASTRODUODENOSCOPY: SHX5428

## 2012-10-01 HISTORY — PX: PEG PLACEMENT: SHX5437

## 2012-10-01 LAB — BASIC METABOLIC PANEL
Calcium: 9.4 mg/dL (ref 8.4–10.5)
Creatinine, Ser: 0.52 mg/dL (ref 0.50–1.35)
GFR calc Af Amer: >90 mL/min (ref >90)
GFR calc non Af Amer: >90 mL/min (ref >90)
GFR calc non Af Amer: >90 mL/min (ref >90)
Glucose, Bld: 117 mg/dL — ABNORMAL HIGH (ref 70–99)
Potassium: 3.9 mEq/L (ref 3.5–5.1)
Sodium: 145 mEq/L (ref 135–145)
Sodium: 146 mEq/L — ABNORMAL HIGH (ref 135–145)

## 2012-10-01 LAB — GLUCOSE, CAPILLARY
Glucose-Capillary: 112 mg/dL — ABNORMAL HIGH (ref 70–99)
Glucose-Capillary: 137 mg/dL — ABNORMAL HIGH (ref 70–99)
Glucose-Capillary: 75 mg/dL (ref 70–99)
Glucose-Capillary: 134 mg/dL — ABNORMAL HIGH (ref 70–99)

## 2012-10-01 SURGERY — EGD (ESOPHAGOGASTRODUODENOSCOPY)
Anesthesia: Moderate Sedation

## 2012-10-01 MED ORDER — FENTANYL CITRATE 0.05 MG/ML IJ SOLN
INTRAMUSCULAR | Status: DC | PRN
  Administered 2012-10-01: 50 ug via INTRAVENOUS
  Administered 2012-10-01: 25 ug via INTRAVENOUS
  Administered 2012-10-01: 50 ug via INTRAVENOUS

## 2012-10-01 MED ORDER — FENTANYL CITRATE 0.05 MG/ML IJ SOLN
INTRAMUSCULAR | Status: AC
  Filled 2012-10-01: qty 2

## 2012-10-01 MED ORDER — FLUMAZENIL 0.5 MG/5ML IV SOLN
INTRAVENOUS | Status: DC | PRN
  Administered 2012-10-01: .5 mg via INTRAVENOUS

## 2012-10-01 MED ORDER — FLUMAZENIL 0.5 MG/5ML IV SOLN
INTRAVENOUS | Status: AC
  Filled 2012-10-01: qty 5

## 2012-10-01 MED ORDER — JEVITY 1.2 CAL PO LIQD
1000.0000 mL | ORAL | Status: DC
  Administered 2012-10-01: 16:00:00
  Administered 2012-10-02 – 2012-10-03 (×2): 1000 mL
  Filled 2012-10-01 (×5): qty 1000

## 2012-10-01 MED ORDER — MIDAZOLAM HCL 10 MG/2ML IJ SOLN
INTRAMUSCULAR | Status: DC | PRN
  Administered 2012-10-01: 2 mg via INTRAVENOUS
  Administered 2012-10-01: 1 mg via INTRAVENOUS
  Administered 2012-10-01 (×2): 2 mg via INTRAVENOUS

## 2012-10-01 MED ORDER — BUDESONIDE 0.25 MG/2ML IN SUSP
0.5000 mg | Freq: Two times a day (BID) | RESPIRATORY_TRACT | Status: DC
  Administered 2012-10-02 – 2012-10-03 (×3): 0.5 mg via RESPIRATORY_TRACT
  Filled 2012-10-01 (×8): qty 4

## 2012-10-01 MED ORDER — MIDAZOLAM HCL 5 MG/ML IJ SOLN
INTRAMUSCULAR | Status: AC
  Filled 2012-10-01: qty 2

## 2012-10-01 MED ORDER — BUTAMBEN-TETRACAINE-BENZOCAINE 2-2-14 % EX AERO
INHALATION_SPRAY | CUTANEOUS | Status: DC | PRN
  Administered 2012-10-01: 3 via TOPICAL

## 2012-10-01 NOTE — Progress Notes (Signed)
Orthopedic Tech Progress Note Patient Details:  [name] 12-05-1931 [id number]  Ortho Devices Type of Ortho Device: Abdominal binder Ortho Device/Splint Interventions: Application   Cammer, Mickie Bail 10/01/2012, 11:43 AM

## 2012-10-01 NOTE — Progress Notes (Signed)
Pt. Speech is becoming incomprehensible/garbled. Pt. Will follow commands intermittently. Pt. Has labored breathing with O2 sats 100% on 2L nasal cannula. MD made aware. Neb treatment was given. Will continue to monitor.

## 2012-10-01 NOTE — Progress Notes (Signed)
PT Cancellation Note  Treatment cancelled today due to medical issues with patient which prohibited therapy.  10/01/2012  Granby Bing, PT [phone] (pager)   Raysa Bosak, Eliseo Gum 10/01/2012, 4:39 PM

## 2012-10-01 NOTE — Progress Notes (Signed)
eLink Physician-Brief Progress Note Patient Name: [name] DOB: 1931-02-20 MRN: [id number]  Date of Service  10/01/2012   HPI/Events of Note   Pt with NSVT  eICU Interventions  chk Mg, bmet, ECG monitor   Intervention Category Major Interventions: Arrhythmia - evaluation and management  Shan Levans 10/01/2012, 4:01 PM

## 2012-10-01 NOTE — Op Note (Signed)
OPERATIVE REPORT  DATE OF OPERATION: 09/25/2012 - 10/01/2012  PATIENT:  Quintin Alto  78 y.o. male  PRE-OPERATIVE DIAGNOSIS:  Severe dysphagia with aspiration risk  POST-OPERATIVE DIAGNOSIS:  Same with proximal esophageal stricture  PROCEDURE:  Procedure(s): ESOPHAGOGASTRODUODENOSCOPY (EGD) PERCUTANEOUS ENDOSCOPIC GASTROSTOMY (PEG) PLACEMENT  SURGEON:  Surgeon(s): Cherylynn Ridges, MD  ASSISTANTLeotis Shames, PA-C  ANESTHESIA:   local and IV sedation  EBL: <20 ml  BLOOD ADMINISTERED: none  DRAINS: Gastrostomy Tube   SPECIMEN:  No Specimen  COUNTS CORRECT:  YES  PROCEDURE DETAILS: This procedure was performed in the endoscopy suite and [facility].  After proper time out was performed identifying the patient and the procedure to be performed we gave the patient some intravenous sedation using fentanyl and Versed. For the entire procedure 125 mcg of fentanyl were used and a total of 4 mg of IV Versed.  The abdominal wall in the left upper quadrant was prepped and draped in the usual sterile manner. We initiated the endoscopy procedure with a Pentax [id number] endoscope. Because of anatomical problems including a small Zenker's diverticulum and a possible proximal esophageal stricture we could not pass the regular endoscope into the proximal esophagus. A pediatric  Pentax [id number] endoscope was used in order to navigate the upper stricture and pass the endoscope into the patient's stomach. It was there that we were able to see the impulse from the assistant scan on the intra-abdominal wall with incision was made and the angiocatheter passed into the stomach under direct vision. A snare was used to retrieve a looped the blue wire from the Angiocath and pulled out through the patient's mouth. We then were able to pass the pull-through gastrostomy tube looped with the blue wire through the patient's mouth into the stomach up to his proper position stopped about a bolster. During an attempt  to follow the G-tube into the stomach the patient began to desaturate and we abandoned the procedure to follow the gastrostomy tube to its proper position. However it was felt as though the procedure went smoothly and it should have been in place.  In the usual manner an outer circular flange was used to secure the catheter in place.  All counts were correct.  PATIENT DISPOSITION:  PACU - hemodynamically stable.   Lakeysha Slutsky O 10/2/201310:46 AM

## 2012-10-01 NOTE — Progress Notes (Signed)
Pt extremely agitated, cussing and trying to get out of bed, confused and wanting to go home, yelling at his daughter who is at the bedside, 4mg  of haldol given to try to calm him after exhausting all other calming methods.

## 2012-10-01 NOTE — Progress Notes (Addendum)
Clinical Social Worker received referral for Advanced Directives/Living Will- Clinical Social Worker has been following for discharge planning. At this time, pt has complex family dynamics including children in various states and an ex-wife who has been seeking to provide information about who should be participating in pt care planning. Clinical Social Worker attempted to meet with patient in order to identify appropriate family members with regard to sharing patient information as well as options regarding advanced directives/living will. Pt had returned from PEG placement and not yet oriented or appropriate for discussion about family member and decision making at this time. Clinical Social Worker will continue to follow in regard to discharge planning and follow up with patient when appropriate.  Jacklynn Lewis, MSW, LCSWA  Clinical Social Work [phone]
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
## 2012-10-01 NOTE — Consult Note (Signed)
Asked by Dr. Yetta Barre to see this patient for possible PEG placement because of feeding difficulties postoperatively.  He has had a midline incision for liver cancer in the past, but the patient cannot tell me when that was.  Will attempt to place PEG, but will be very cautious because of prior laparotomy.  Marta Lamas. Gae Bon, MD, FACS [phone] [facility]

## 2012-10-01 NOTE — OR Nursing (Signed)
Pt in ENDO for PEG placement with Dr. Lindie Spruce. At end of procedure at 1030, patient O2 sat dropped to 83% on 4L. Dr. Lindie Spruce placed oral airway, used ambu bag as a simple face mask with 15L O2. Patients O2 sat came up to 91-93%. Per Dr. Dixon Boos order, gave patient 0.5mg  Romaizcon. Pateint became more arousable and spit oral airway out. O2 sat came up to 98%. Redturned patient to O@ via nasal cannula at 4L.

## 2012-10-02 ENCOUNTER — Encounter (HOSPITAL_COMMUNITY): Payer: Self-pay

## 2012-10-02 ENCOUNTER — Encounter (HOSPITAL_COMMUNITY): Payer: Self-pay | Admitting: General Surgery

## 2012-10-02 LAB — GLUCOSE, CAPILLARY
Glucose-Capillary: 146 mg/dL — ABNORMAL HIGH (ref 70–99)
Glucose-Capillary: 160 mg/dL — ABNORMAL HIGH (ref 70–99)
Glucose-Capillary: 207 mg/dL — ABNORMAL HIGH (ref 70–99)

## 2012-10-02 MED ORDER — FREE WATER
140.0000 mL | Freq: Four times a day (QID) | Status: DC
  Administered 2012-10-02 – 2012-10-03 (×5): 140 mL

## 2012-10-02 NOTE — Progress Notes (Signed)
I have insurance approval for inpt rehab admit tomorrow if Dr. Yetta Barre feels he is medically ready to admit. I will follow up in the morning for that clarification. I have asked RN to contact me when [name] arrives in the morning  To discuss his d/c needs. She is his room mate and he wishes to return home with her upon d/c. I have notified pt's daughter, Marcelino Duster, of the above. [phone]
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
## 2012-10-02 NOTE — Progress Notes (Signed)
Speech Language Pathology Dysphagia Treatment Patient Details Name: [name] MRN: [id number] DOB: 01/23/1931 Today's Date: 10/02/2012 Time: 7829-5621 SLP Time Calculation (min): 13 min  Assessment / Plan / Recommendation Clinical Impression  SLP provided therapeutic trials of ice chips following oral care with max verbal cues for effortful swallows and productive cough. Pt with immediate and severe signs of aspiration though likely expectorating most aspirates with hard coughs and cues to swallow multiple times. Pt tired after 3-4 attempts. SLP will continue to follow.     Diet Recommendation  Continue with Current Diet: NPO    SLP Plan     Pertinent Vitals/Pain NA   Swallowing Goals  SLP Swallowing Goals Goal #3: Patient will consume clinician provided po trials for differential diagnosis of dysphagia with moderate cues for use of compensatory strategies.  Swallow Study Goal #3 - Progress: Progressing toward goal  General    Oral Cavity - Oral Hygiene     Dysphagia Treatment Treatment focused on: Upgraded PO texture trials;Facilitation of pharyngeal phase Treatment Methods/Modalities: Effortful swallow;Skilled observation Patient observed directly with PO's: Yes Type of PO's observed: Ice chips Liquids provided via: Teaspoon Pharyngeal Phase Signs & Symptoms: Multiple swallows;Wet vocal quality;Immediate throat clear;Immediate cough;Delayed cough;Changes in respirations Type of cueing: Verbal Amount of cueing: Moderate   GO     Whitley Patchen, Riley Nearing 10/02/2012, 4:32 PM

## 2012-10-02 NOTE — Progress Notes (Signed)
Physical Therapy Treatment Patient Details Name: [name] MRN: [id number] DOB: [birth year] Today's Date: 10/02/2012 Time: 4098-1191 PT Time Calculation (min): 33 min  PT Assessment / Plan / Recommendation Comments on Treatment Session  Pt appears miserable and is restless.  He has very little tolerance for activity; Bil UE's are weak and uncoordinated    Follow Up Recommendations  Post acute inpatient rehab    Barriers to Discharge        Equipment Recommendations  3 in 1 bedside comode;Tub/shower seat    Recommendations for Other Services    Frequency     Plan Discharge plan remains appropriate;Frequency remains appropriate    Precautions / Restrictions Precautions Precautions: Fall Required Braces or Orthoses: Cervical Brace Cervical Brace: Hard collar   Pertinent Vitals/Pain     Mobility  Bed Mobility Bed Mobility: Supine to Sit Supine to Sit: 3: Mod assist;HOB elevated Details for Bed Mobility Assistance: Head supported due to pt in a position where unable to roll Transfers Transfers: Sit to Stand;Stand to Sit;Stand Pivot Transfers Sit to Stand: 3: Mod assist;From bed Stand to Sit: 3: Mod assist;To chair/3-in-1 Stand Pivot Transfers: 1: +2 Total assist Stand Pivot Transfers: Patient Percentage: 60% Details for Transfer Assistance: stability and supportive assist for standing and pivot Ambulation/Gait Ambulation/Gait Assistance: Other (comment) (pt unable to muster enough energy to attempt walking) Stairs: No Wheelchair Mobility Wheelchair Mobility: No    Exercises     PT Diagnosis:    PT Problem List:   PT Treatment Interventions:     PT Goals Acute Rehab PT Goals PT Goal Formulation: With patient/family Time For Goal Achievement: 10/10/12 Potential to Achieve Goals: Good PT Goal: Rolling Supine to Right Side - Progress: Not met PT Goal: Rolling Supine to Left Side - Progress: Not met PT Goal: Supine/Side to Sit - Progress: Progressing toward  goal PT Goal: Sit at Edge Of Bed - Progress: Progressing toward goal PT Goal: Sit to Stand - Progress: Progressing toward goal PT Goal: Stand to Sit - Progress: Progressing toward goal PT Transfer Goal: Bed to Chair/Chair to Bed - Progress: Progressing toward goal PT Goal: Ambulate - Progress: Progressing toward goal  Visit Information  Last PT Received On: 10/02/12 Assistance Needed: +2    Subjective Data  Subjective: I hurt all over...help me please   Cognition  Overall Cognitive Status: Impaired Arousal/Alertness: Awake/alert Behavior During Session: Restless    Balance  Static Sitting Balance Static Sitting - Balance Support: No upper extremity supported;Feet supported Static Sitting - Level of Assistance: 4: Min assist;3: Mod assist Static Sitting - Comment/# of Minutes: 4; tended to list moderately posteriorly when not supported Static Standing Balance Static Standing - Balance Support: Bilateral upper extremity supported;During functional activity Static Standing - Level of Assistance: 3: Mod assist  End of Session PT - End of Session Activity Tolerance: Patient limited by fatigue;Patient tolerated treatment well Patient left: in chair;with call bell/phone within reach Nurse Communication: Mobility status   GP     Season Astacio, Eliseo Gum 10/02/2012, 4:12 PM  10/02/2012  [place] Bing, PT [phone] (pager)

## 2012-10-02 NOTE — Progress Notes (Signed)
Nutrition Follow-up  Intervention:   1. Start free water flushes 140 ml QID 2. Continue to advance TF to goal rate. 3. Once at goal rate can transition to bolus TF with goal of 1.5 cans QID.  Assessment:   Patient has PEG in place. Jevity 1.2 is infusing @ 40 ml/hr. TF advancing slowly due to risk of refeeding syndrome. Tube feeding regimen currently providing 1152 kcal, 53 grams protein, and 777 ml H2O.   Residuals: 0  Last bm: not documented  Per chart review pt had arrhythmia over night. Mg WNL. Pt had been receiving banana bag IVF when first admitted which could have blunted any refeeding symptoms.   Diet Order:  NPO  Meds: Scheduled Meds:   . albuterol  2.5 mg Nebulization Q6H  . ampicillin-sulbactam (UNASYN) IV  1.5 g Intravenous Q6H  . antiseptic oral rinse  15 mL Mouth Rinse QID  . aspirin  150 mg Rectal Daily  . budesonide  0.5 mg Nebulization BID  . folic acid  1 mg Intravenous Daily  . insulin aspart  2-6 Units Subcutaneous Q4H  . ipratropium  0.5 mg Nebulization Q6H  . levothyroxine  44 mcg Intravenous Daily  . metoprolol  5 mg Intravenous Q8H  . pantoprazole (PROTONIX) IV  40 mg Intravenous Daily  . sodium chloride  3 mL Intravenous Q12H  . thiamine  100 mg Intravenous Daily   Continuous Infusions:   . sodium chloride 50 mL/hr at 10/02/12 1100  . feeding supplement (JEVITY 1.2 CAL) 20 mL/hr at 10/01/12 1552   PRN Meds:.acetaminophen, acetaminophen, cyclobenzaprine, fentaNYL, haloperidol lactate, labetalol, morphine injection, nitroGLYCERIN, ondansetron (ZOFRAN) IV, sodium chloride  Labs:  CMP     Component Value Date/Time   NA 146* 10/01/2012 1729   K 3.9 10/01/2012 1729   CL 105 10/01/2012 1729   CO2 30 10/01/2012 1729   GLUCOSE 106* 10/01/2012 1729   BUN 26* 10/01/2012 1729   CREATININE 0.55 10/01/2012 1729   CALCIUM 9.8 10/01/2012 1729   PROT 7.6 04/08/2012 1455   ALBUMIN 3.7 04/08/2012 1455   AST 38* 04/08/2012 1455   ALT 21 04/08/2012 1455   ALKPHOS 98  04/08/2012 1455   BILITOT 0.8 04/08/2012 1455   GFRNONAA >90 10/01/2012 1729   GFRAA >90 10/01/2012 1729   CBG (last 3)   Basename 10/02/12 1155 10/02/12 0742 10/02/12 0353  GLUCAP 152* 146* 184*    Sodium  Date/Time Value Range Status  10/01/2012  5:29 PM 146* 135 - 145 mEq/L Final  10/01/2012  5:15 AM 145  135 - 145 mEq/L Final  09/30/2012  4:25 AM 144  135 - 145 mEq/L Final     DELTA CHECK NOTED    Potassium  Date/Time Value Range Status  10/01/2012  5:29 PM 3.9  3.5 - 5.1 mEq/L Final  10/01/2012  5:15 AM 3.7  3.5 - 5.1 mEq/L Final  09/30/2012  4:25 AM 3.8  3.5 - 5.1 mEq/L Final    Phosphorus  Date/Time Value Range Status  09/27/2012  6:38 AM 2.5  2.3 - 4.6 mg/dL Final    Magnesium  Date/Time Value Range Status  10/01/2012  5:29 PM 1.6  1.5 - 2.5 mg/dL Final  1/61/0960  4:54 AM 1.5  1.5 - 2.5 mg/dL Final     Intake/Output Summary (Last 24 hours) at 10/02/12 1309 Last data filed at 10/02/12 1115  Gross per 24 hour  Intake   1760 ml  Output   1535 ml  Net  225 ml    Weight Status:   124 lbs 10/3 132 lbs 9/30  Estimated needs:  Kcal: 1650-1820; Protein: 77-88g  Nutrition Dx:  Inadequate oral intake r/t difficulty swallowing, AMS AEB MBS and PEG; ongoing.   Goal:  Goal: Pt to meet >/= 90% of their estimated nutrition needs; progressing.  Monitor:  TF tolerance, weight   Kendell Bane RD, LDN, CNSC [phone] Pager [phone] After Hours Pager

## 2012-10-03 ENCOUNTER — Inpatient Hospital Stay (HOSPITAL_COMMUNITY)
Admission: RE | Admit: 2012-10-03 | Discharge: 2012-11-03 | DRG: 945 | Disposition: A | Discharge status: Other Acute Inpatient Hospital | Payer: PRIVATE HEALTH INSURANCE | Source: Ambulatory Visit | Attending: Physical Medicine & Rehabilitation | Admitting: Physical Medicine & Rehabilitation

## 2012-10-03 ENCOUNTER — Encounter (HOSPITAL_COMMUNITY): Payer: Self-pay | Admitting: *Deleted

## 2012-10-03 DIAGNOSIS — S069X9A Unspecified intracranial injury with loss of consciousness of unspecified duration, initial encounter: Secondary | ICD-10-CM

## 2012-10-03 DIAGNOSIS — Y95 Nosocomial condition: Secondary | ICD-10-CM | POA: Diagnosis not present

## 2012-10-03 DIAGNOSIS — E785 Hyperlipidemia, unspecified: Secondary | ICD-10-CM | POA: Diagnosis present

## 2012-10-03 DIAGNOSIS — Z5189 Encounter for other specified aftercare: Secondary | ICD-10-CM

## 2012-10-03 DIAGNOSIS — F1011 Alcohol abuse, in remission: Secondary | ICD-10-CM

## 2012-10-03 DIAGNOSIS — I679 Cerebrovascular disease, unspecified: Secondary | ICD-10-CM | POA: Diagnosis present

## 2012-10-03 DIAGNOSIS — C220 Liver cell carcinoma: Secondary | ICD-10-CM

## 2012-10-03 DIAGNOSIS — J690 Pneumonitis due to inhalation of food and vomit: Secondary | ICD-10-CM | POA: Diagnosis present

## 2012-10-03 DIAGNOSIS — Z981 Arthrodesis status: Secondary | ICD-10-CM

## 2012-10-03 DIAGNOSIS — G9340 Encephalopathy, unspecified: Secondary | ICD-10-CM | POA: Diagnosis present

## 2012-10-03 DIAGNOSIS — I10 Essential (primary) hypertension: Secondary | ICD-10-CM | POA: Diagnosis present

## 2012-10-03 DIAGNOSIS — S129XXA Fracture of neck, unspecified, initial encounter: Secondary | ICD-10-CM

## 2012-10-03 DIAGNOSIS — I4892 Unspecified atrial flutter: Secondary | ICD-10-CM | POA: Diagnosis not present

## 2012-10-03 DIAGNOSIS — J95821 Acute postprocedural respiratory failure: Secondary | ICD-10-CM

## 2012-10-03 DIAGNOSIS — R1312 Dysphagia, oropharyngeal phase: Secondary | ICD-10-CM | POA: Diagnosis not present

## 2012-10-03 DIAGNOSIS — Z681 Body mass index (BMI) 19 or less, adult: Secondary | ICD-10-CM

## 2012-10-03 DIAGNOSIS — R195 Other fecal abnormalities: Secondary | ICD-10-CM

## 2012-10-03 DIAGNOSIS — R1310 Dysphagia, unspecified: Secondary | ICD-10-CM | POA: Diagnosis present

## 2012-10-03 DIAGNOSIS — D649 Anemia, unspecified: Secondary | ICD-10-CM

## 2012-10-03 DIAGNOSIS — E871 Hypo-osmolality and hyponatremia: Secondary | ICD-10-CM | POA: Diagnosis present

## 2012-10-03 DIAGNOSIS — S14129A Central cord syndrome at unspecified level of cervical spinal cord, initial encounter: Secondary | ICD-10-CM

## 2012-10-03 DIAGNOSIS — W19XXXA Unspecified fall, initial encounter: Secondary | ICD-10-CM

## 2012-10-03 DIAGNOSIS — D72829 Elevated white blood cell count, unspecified: Secondary | ICD-10-CM | POA: Diagnosis present

## 2012-10-03 DIAGNOSIS — E039 Hypothyroidism, unspecified: Secondary | ICD-10-CM | POA: Diagnosis present

## 2012-10-03 DIAGNOSIS — E1149 Type 2 diabetes mellitus with other diabetic neurological complication: Secondary | ICD-10-CM | POA: Diagnosis present

## 2012-10-03 DIAGNOSIS — E119 Type 2 diabetes mellitus without complications: Secondary | ICD-10-CM

## 2012-10-03 DIAGNOSIS — IMO0002 Reserved for concepts with insufficient information to code with codable children: Secondary | ICD-10-CM

## 2012-10-03 DIAGNOSIS — I739 Peripheral vascular disease, unspecified: Secondary | ICD-10-CM | POA: Diagnosis present

## 2012-10-03 DIAGNOSIS — J189 Pneumonia, unspecified organism: Secondary | ICD-10-CM

## 2012-10-03 DIAGNOSIS — E1142 Type 2 diabetes mellitus with diabetic polyneuropathy: Secondary | ICD-10-CM | POA: Diagnosis present

## 2012-10-03 DIAGNOSIS — D691 Qualitative platelet defects: Secondary | ICD-10-CM | POA: Diagnosis present

## 2012-10-03 DIAGNOSIS — Z931 Gastrostomy status: Secondary | ICD-10-CM

## 2012-10-03 LAB — GLUCOSE, CAPILLARY
Glucose-Capillary: 161 mg/dL — ABNORMAL HIGH (ref 70–99)
Glucose-Capillary: 230 mg/dL — ABNORMAL HIGH (ref 70–99)

## 2012-10-03 MED ORDER — JEVITY 1.2 CAL PO LIQD
1000.0000 mL | ORAL | Status: DC
  Administered 2012-10-04: 1000 mL
  Administered 2012-10-05: 23:00:00
  Filled 2012-10-03 (×6): qty 1000

## 2012-10-03 MED ORDER — BUDESONIDE 0.25 MG/2ML IN SUSP
0.5000 mg | Freq: Two times a day (BID) | RESPIRATORY_TRACT | Status: DC
  Administered 2012-10-04 – 2012-10-06 (×6): 0.5 mg via RESPIRATORY_TRACT
  Filled 2012-10-03 (×10): qty 4

## 2012-10-03 MED ORDER — POLYETHYLENE GLYCOL 3350 17 G PO PACK
17.0000 g | PACK | Freq: Every day | ORAL | Status: DC | PRN
  Administered 2012-10-04 – 2012-10-05 (×2): 17 g
  Filled 2012-10-03 (×2): qty 1

## 2012-10-03 MED ORDER — ALBUTEROL SULFATE (5 MG/ML) 0.5% IN NEBU
2.5000 mg | INHALATION_SOLUTION | Freq: Four times a day (QID) | RESPIRATORY_TRACT | Status: DC
  Administered 2012-10-03 – 2012-10-10 (×26): 2.5 mg via RESPIRATORY_TRACT
  Filled 2012-10-03 (×27): qty 0.5

## 2012-10-03 MED ORDER — ASPIRIN EC 81 MG PO TBEC
81.0000 mg | DELAYED_RELEASE_TABLET | Freq: Every day | ORAL | Status: DC
  Administered 2012-10-04: 81 mg via ORAL
  Filled 2012-10-03 (×2): qty 1

## 2012-10-03 MED ORDER — ACETAMINOPHEN 650 MG RE SUPP: 650.0000 mg | RECTAL | Status: DC | PRN

## 2012-10-03 MED ORDER — SORBITOL 70 % SOLN
30.0000 mL | Freq: Every day | Status: DC | PRN
  Administered 2012-10-04 – 2012-10-16 (×3): 30 mL
  Filled 2012-10-03 (×4): qty 30

## 2012-10-03 MED ORDER — FREE WATER
140.0000 mL | Freq: Four times a day (QID) | Status: DC
  Administered 2012-10-03 – 2012-10-04 (×2): 140 mL

## 2012-10-03 MED ORDER — BIOTENE DRY MOUTH MT LIQD
15.0000 mL | Freq: Four times a day (QID) | OROMUCOSAL | Status: DC
  Administered 2012-10-04 – 2012-11-03 (×96): 15 mL via OROMUCOSAL

## 2012-10-03 MED ORDER — FOLIC ACID 5 MG/ML IJ SOLN
1.0000 mg | Freq: Every day | INTRAMUSCULAR | Status: DC
  Administered 2012-10-04 – 2012-10-05 (×2): 1 mg via INTRAVENOUS
  Filled 2012-10-03 (×6): qty 0.2

## 2012-10-03 MED ORDER — ONDANSETRON HCL 4 MG PO TABS: 4.0000 mg | ORAL_TABLET | Freq: Four times a day (QID) | ORAL | Status: DC | PRN

## 2012-10-03 MED ORDER — ACETAMINOPHEN 325 MG PO TABS
650.0000 mg | ORAL_TABLET | ORAL | Status: DC | PRN
  Administered 2012-10-03 – 2012-10-04 (×2): 650 mg via ORAL
  Filled 2012-10-03 (×2): qty 2

## 2012-10-03 MED ORDER — INSULIN ASPART 100 UNIT/ML ~~LOC~~ SOLN
2.0000 [IU] | SUBCUTANEOUS | Status: DC
  Administered 2012-10-03 – 2012-10-04 (×2): 4 [IU] via SUBCUTANEOUS
  Administered 2012-10-04: 2 [IU] via SUBCUTANEOUS
  Administered 2012-10-04: 4 [IU] via SUBCUTANEOUS
  Administered 2012-10-04: 2 [IU] via SUBCUTANEOUS
  Administered 2012-10-04 – 2012-10-05 (×3): 4 [IU] via SUBCUTANEOUS
  Administered 2012-10-05: 6 [IU] via SUBCUTANEOUS
  Administered 2012-10-05: 4 [IU] via SUBCUTANEOUS
  Administered 2012-10-05: 6 [IU] via SUBCUTANEOUS
  Administered 2012-10-05 – 2012-10-06 (×3): 4 [IU] via SUBCUTANEOUS
  Administered 2012-10-06: 2 [IU] via SUBCUTANEOUS
  Administered 2012-10-06 – 2012-10-07 (×6): 4 [IU] via SUBCUTANEOUS
  Administered 2012-10-07: 2 [IU] via SUBCUTANEOUS
  Administered 2012-10-07: 4 [IU] via SUBCUTANEOUS
  Administered 2012-10-07: 6 [IU] via SUBCUTANEOUS
  Administered 2012-10-07: 4 [IU] via SUBCUTANEOUS
  Administered 2012-10-08 (×4): 6 [IU] via SUBCUTANEOUS

## 2012-10-03 MED ORDER — THIAMINE HCL 100 MG/ML IJ SOLN
100.0000 mg | Freq: Every day | INTRAMUSCULAR | Status: DC
  Administered 2012-10-04 – 2012-10-05 (×2): 100 mg via INTRAVENOUS
  Filled 2012-10-03 (×4): qty 1

## 2012-10-03 MED ORDER — LORAZEPAM 0.5 MG PO TABS
0.5000 mg | ORAL_TABLET | Freq: Four times a day (QID) | ORAL | Status: DC | PRN
  Administered 2012-10-03 – 2012-11-01 (×14): 0.5 mg
  Filled 2012-10-03 (×15): qty 1

## 2012-10-03 MED ORDER — IPRATROPIUM BROMIDE 0.02 % IN SOLN
0.5000 mg | Freq: Four times a day (QID) | RESPIRATORY_TRACT | Status: DC
  Administered 2012-10-03 – 2012-10-14 (×41): 0.5 mg via RESPIRATORY_TRACT
  Filled 2012-10-03 (×42): qty 2.5

## 2012-10-03 MED ORDER — ONDANSETRON HCL 4 MG/2ML IJ SOLN: 4.0000 mg | Freq: Four times a day (QID) | INTRAMUSCULAR | Status: DC | PRN

## 2012-10-03 MED ORDER — METHOCARBAMOL 500 MG PO TABS
500.0000 mg | ORAL_TABLET | Freq: Four times a day (QID) | ORAL | Status: DC | PRN
  Administered 2012-10-04 – 2012-11-03 (×25): 500 mg
  Filled 2012-10-03 (×25): qty 1

## 2012-10-03 MED ORDER — LEVOTHYROXINE SODIUM 100 MCG IV SOLR
44.0000 ug | Freq: Every day | INTRAVENOUS | Status: DC
  Administered 2012-10-04 – 2012-10-05 (×2): 44 ug via INTRAVENOUS
  Filled 2012-10-03 (×5): qty 2.2
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
## 2012-10-03 NOTE — Progress Notes (Signed)
Physical Therapy Treatment Patient Details Name: [name] MRN: [id number] DOB: August 16, 1931 Today's Date: 10/03/2012 Time: 4098-1191 PT Time Calculation (min): 28 min  PT Assessment / Plan / Recommendation Comments on Treatment Session  Much more participative; ready to begin rehab now    Follow Up Recommendations  Post acute inpatient rehab    Barriers to Discharge        Equipment Recommendations  3 in 1 bedside comode;Tub/shower seat    Recommendations for Other Services    Frequency     Plan Discharge plan remains appropriate;Frequency remains appropriate    Precautions / Restrictions Precautions Precautions: Fall Required Braces or Orthoses: Cervical Brace Cervical Brace: Hard collar   Pertinent Vitals/Pain     Mobility  Bed Mobility Bed Mobility: Supine to Sit Supine to Sit: 3: Mod assist;HOB elevated (pt <75%) Sitting - Scoot to Edge of Bed: 3: Mod assist (but with good initiation) Details for Bed Mobility Assistance: Much better initiation and participation; good truncal activity; needed truncal support Transfers Transfers: Sit to Stand;Stand to Sit Sit to Stand: 3: Mod assist;From bed;From chair/3-in-1 Sit to Stand: Patient Percentage: 70% Stand to Sit: 3: Mod assist;To chair/3-in-1 Stand to Sit: Patient Percentage: 60% Details for Transfer Assistance: vc/tc's for hand placement; less need for truncal support, but still needs assist coming forward. Ambulation/Gait Ambulation/Gait Assistance: 1: +2 Total assist Ambulation/Gait: Patient Percentage: 60% Ambulation Distance (Feet): 7 Feet (x3) Assistive device: Rolling walker Ambulation/Gait Assistance Details: moderately unsteady with some scissoring at times; truncal stability needed for hip/core control.  Pt not able to maintain L hand placement on the RW, but was able to minimally control the RW on the R Gait Pattern: Step-through pattern;Decreased step length - right;Decreased step length -  left;Decreased stride length;Scissoring;Ataxic Stairs: No    Exercises     PT Diagnosis:    PT Problem List:   PT Treatment Interventions:     PT Goals Acute Rehab PT Goals PT Goal Formulation: With patient/family Potential to Achieve Goals: Good PT Goal: Supine/Side to Sit - Progress: Progressing toward goal PT Goal: Sit at Edge Of Bed - Progress: Progressing toward goal PT Goal: Sit to Stand - Progress: Progressing toward goal PT Goal: Stand to Sit - Progress: Progressing toward goal PT Transfer Goal: Bed to Chair/Chair to Bed - Progress: Progressing toward goal PT Goal: Ambulate - Progress: Progressing toward goal  Visit Information  Last PT Received On: 10/03/12 Assistance Needed: +2    Subjective Data  Subjective: I need the suction... I can't use my left arm   Cognition  Overall Cognitive Status: Appears within functional limits for tasks assessed/performed Arousal/Alertness: Awake/alert Behavior During Session: Restless    Balance  Balance Balance Assessed: Yes Static Sitting Balance Static Sitting - Balance Support: Right upper extremity supported;Feet supported Static Sitting - Level of Assistance: 5: Stand by assistance;4: Min assist Static Sitting - Comment/# of Minutes: 4 Dynamic Sitting Balance Dynamic Sitting - Balance Support: Right upper extremity supported;Feet supported;During functional activity Dynamic Sitting - Level of Assistance: 4: Min assist Static Standing Balance Static Standing - Balance Support: Right upper extremity supported;Left upper extremity supported;During functional activity Static Standing - Level of Assistance: 3: Mod assist  End of Session PT - End of Session Equipment Utilized During Treatment: Gait belt;Cervical collar Activity Tolerance: Patient tolerated treatment well Patient left: in chair;with call bell/phone within reach Nurse Communication: Mobility status   GP     Simi Briel, Eliseo Gum 10/03/2012, 11:47  AM  10/03/2012  Rocky Link  Dione Mccombie, PT [phone] (pager)
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
## 2012-10-03 NOTE — Plan of Care (Signed)
Overall Plan of Care [facility]) Patient Details Name: [name] MRN: [id number] DOB: 1931/10/27  Diagnosis:  Cervical central cord injury, cervical fx's  Primary Diagnosis:    Central cord syndrome Co-morbidities: dm, htn, ?etoh abuse  Functional Problem List  Patient demonstrates impairments in the following areas: Balance, Behavior, Bladder, Bowel, Cognition, Endurance, Medication Management, Motor, Nutrition, Pain, Safety, Sensory  and Skin Integrity  Basic ADL's: eating, grooming, bathing, dressing and toileting Advanced ADL's: other  Transfers:  bed mobility, bed to chair, toilet, tub/shower, car and furniture Locomotion:  ambulation, wheelchair mobility and stairs  Additional Impairments:  Functional use of upper extremity, Swallowing, Leisure Awareness and Discharge Disposition  Anticipated Outcomes Item Anticipated Outcome  Eating/Swallowing  Min assist  Basic self-care  Mod A overall  Tolieting  Max A  Bowel/Bladder  Min assist  Transfers  S-min A  Locomotion  Min A  Communication    Cognition    Pain  Managed at 3 or less  Safety/Judgment  Min assist  Other     Therapy Plan: PT Frequency: 1-2 X/day, 60-90 minutes OT Frequency: 1-2 X/day, 60-90 minutes SLP Frequency: 1-2 X/day, 30-60 minutes   Team Interventions: Item RN PT OT SLP SW TR Other  Self Care/Advanced ADL Retraining   x      Neuromuscular Re-Education  x x      Therapeutic Activities  x x      UE/LE Strength Training/ROM  x x      UE/LE Coordination Activities  x x      Visual/Perceptual Remediation/Compensation         DME/Adaptive Equipment Instruction  x x      Therapeutic Exercise  x x      Balance/Vestibular Training  x x      Patient/Family Education x x x      Cognitive Remediation/Compensation  x x x     Functional Mobility Training  x x      Ambulation/Gait Training  x       Museum/gallery curator  x       Wheelchair Propulsion/Positioning  x       Functional Technical brewer Reintegration  x x      Dysphagia/Aspiration Printmaker   x x     Speech/Language Facilitation         Bladder Management x        Bowel Management x        Disease Management/Prevention x        Pain Management x x x      Medication Management x        Skin Care/Wound Management x  x      Splinting/Orthotics   x      Discharge Planning  x x      Psychosocial Support x                           Team Discharge Planning: Destination:  Home Projected Follow-up:  PT, OT, SLP and Home Health Projected Equipment Needs:  PT and OT TBD Patient/family involved in discharge planning:  Yes  MD ELOS: 3 weeks Medical Rehab Prognosis:  Good Assessment: Pt admitted for CIR therapies. The team will be addressing self-care, fxnl mobility, NMR, adaptive equipment, bowel and bladder fxn, cognition, communicaiton, swallowing, family ed. Goals are min assist with mobility and min to mod assist with self-care.
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
## 2012-10-03 NOTE — Progress Notes (Signed)
Clinical Social Worker spoke with Inpatient Rehab RN, Ottie Glazier in regard to pt discharge plan. Inpatient Rehab RN was able to speak to patient about wishes for discharge planning. Per her report, pt would like to return home with his friend [name] following rehab stay. Inpatient rehab has received insurance approval for inpatient rehab and plan is for transfer to inpatient rehab today. No further social work needs identified at this time. Clinical Social Worker signing off.   Jacklynn Lewis, MSW, LCSWA  Clinical Social Work [phone]

## 2012-10-03 NOTE — Progress Notes (Signed)
Nursing 1400 Patient is being discharged to inpatient rehab.  Order in place from Dr. Yetta Barre.  Vital signs stable.  PIV, PEG, and foley catheter to remain in place for discharge.  Patient education completed and understood.  Medications to be discussed at discharge from rehab.  Report given to [place], RN at [phone].

## 2012-10-03 NOTE — Progress Notes (Signed)
Pt. Admitted to rehab 4006 @ 1440.  AOX4, VSS, no complaints of pain. Daughter at bedside.  Pt and family oriented to rehab. Soft call bell requested, pt. Unable to call--weakness in Upper Extremities.
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
## 2012-10-03 NOTE — Progress Notes (Signed)
Patient in agreement to admission to inpt rehab today. I have insurance approval and have contacted daughter, Marcelino Duster, and she is aware and in agreement. I contacted Dr. Yetta Barre and he feels pt is medically ready to d/c to rehab today and will make arrangements. Please call me for any questions. [phone].

## 2012-10-04 ENCOUNTER — Inpatient Hospital Stay (HOSPITAL_COMMUNITY): Payer: PRIVATE HEALTH INSURANCE | Admitting: Occupational Therapy

## 2012-10-04 ENCOUNTER — Inpatient Hospital Stay (HOSPITAL_COMMUNITY): Payer: PRIVATE HEALTH INSURANCE | Admitting: *Deleted

## 2012-10-04 ENCOUNTER — Inpatient Hospital Stay (HOSPITAL_COMMUNITY): Payer: PRIVATE HEALTH INSURANCE | Admitting: Speech Pathology

## 2012-10-04 DIAGNOSIS — S069X9A Unspecified intracranial injury with loss of consciousness of unspecified duration, initial encounter: Secondary | ICD-10-CM

## 2012-10-04 DIAGNOSIS — Z5189 Encounter for other specified aftercare: Secondary | ICD-10-CM

## 2012-10-04 DIAGNOSIS — IMO0002 Reserved for concepts with insufficient information to code with codable children: Secondary | ICD-10-CM

## 2012-10-04 DIAGNOSIS — R1310 Dysphagia, unspecified: Secondary | ICD-10-CM

## 2012-10-04 LAB — GLUCOSE, CAPILLARY
Glucose-Capillary: 149 mg/dL — ABNORMAL HIGH (ref 70–99)
Glucose-Capillary: 158 mg/dL — ABNORMAL HIGH (ref 70–99)
Glucose-Capillary: 187 mg/dL — ABNORMAL HIGH (ref 70–99)

## 2012-10-04 MED ORDER — FREE WATER
140.0000 mL | Freq: Four times a day (QID) | Status: DC
  Administered 2012-10-04 – 2012-10-07 (×12): 140 mL

## 2012-10-04 MED ORDER — ONDANSETRON HCL 4 MG PO TABS: 4.0000 mg | ORAL_TABLET | Freq: Four times a day (QID) | ORAL | Status: DC | PRN

## 2012-10-04 MED ORDER — ASPIRIN 81 MG PO CHEW
81.0000 mg | CHEWABLE_TABLET | Freq: Every day | ORAL | Status: DC
  Administered 2012-10-05 – 2012-11-03 (×29): 81 mg
  Filled 2012-10-04 (×27): qty 1

## 2012-10-04 MED ORDER — ACETAMINOPHEN 325 MG PO TABS
650.0000 mg | ORAL_TABLET | ORAL | Status: DC | PRN
  Administered 2012-10-05 – 2012-11-03 (×41): 650 mg
  Filled 2012-10-04 (×44): qty 2

## 2012-10-04 MED ORDER — ONDANSETRON HCL 4 MG/2ML IJ SOLN
4.0000 mg | Freq: Four times a day (QID) | INTRAMUSCULAR | Status: DC | PRN
  Administered 2012-10-28: 4 mg via INTRAVENOUS
  Filled 2012-10-04: qty 2

## 2012-10-04 MED ORDER — ACETAMINOPHEN 650 MG RE SUPP
650.0000 mg | RECTAL | Status: DC | PRN
  Administered 2012-10-16 – 2012-10-19 (×3): 650 mg via RECTAL
  Filled 2012-10-04 (×4): qty 1
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
## 2012-10-04 NOTE — Progress Notes (Addendum)
Physical Therapy Session Note  Patient Details  Name: [name] MRN: [id number] Date of Birth: November 05, 1931  Today's Date: 10/04/2012 Time: 1300-1320 Time Calculation (min): 20 min   Skilled Therapeutic Interventions/Progress Updates:    Per staff, pt did not tolerate OOB sitting in recliner long this morning but pt unable to give details. Family present and appear anxious.  [name] states that she can provide physical assistance.  Therapeutic activity- pt assisted supine to sit total A to R, stood x 2 then refused further activity and requested to lie down, mod A, +2 A to move up in the bed.  Pt having difficulty staying awake despite MANY visitors in room, encouraged them to allow pt to rest and for him to get OOB later again.  Pt will LOB seated EOB to don gown, static balance close S to mod A.  In standing, balance more impaired than this morning and pt required max A for static balance leaning to R and unable to correct with verbal or visual cues, knees remained slightly flexed. With max encouragement lifted BUE with assist x 5  Nurse informed of pt inability to complete PT session and that pt requesting compression sleeve on ankles to be removed despite education on their purpose.  Pt and family educated on goal of 3 hrs participation in threapy per day.  Pt had no c/o pain.  Therapy Documentation Precautions:  Precautions Precautions: Fall Cervical Brace: Hard collar Restrictions Weight Bearing Restrictions: No General: Amount of Missed PT Time (min): 25 Minutes Missed Time Reason: Patient fatigue   :    See FIM for current functional status  Therapy/Group: Individual Therapy  Michaelene Song 10/04/2012, 1:27 PM
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
## 2012-10-04 NOTE — Progress Notes (Signed)
Found patient in chair in room after son had moved patient to chair from floor.  Son stated he found patient on the floor in the room.  Foley and PEG tube in place, dressing CDI.  No S&S of trauma to penile area or abdominal area of tube placement.  Urine yellow/clear.  Aspen collar on neck and proper placement.  Patient denies pain.  Neck steristrip dressing CDI.  Vitals stable, see flowsheet.  No obvious sign of injury or trauma to patient.  Skin WNL.  Informed family and Dr. Timoteo Gaul.  No orders or concerns at this time.  Patient placed back in bed with bed alarm on.  Informed Dr. Timoteo Gaul also of patient not tolerating SCDs during PT eval.  Requested TED hose order.  No order for TEDs at this time due to SCDs only VTE at this time.  SCDs currently on patient and machine on.  Patient tolerating SCDs at this time.  Also informed Dr. Timoteo Gaul that heart beat has been irregular this morning and is currently irregular.  No orders at this time.  Patient resting currently, no other concerns.  Barrie Lyme 3:55 PM 10/04/2012
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
## 2012-10-05 ENCOUNTER — Inpatient Hospital Stay (HOSPITAL_COMMUNITY): Payer: PRIVATE HEALTH INSURANCE | Admitting: Occupational Therapy

## 2012-10-05 LAB — GLUCOSE, CAPILLARY
Glucose-Capillary: 163 mg/dL — ABNORMAL HIGH (ref 70–99)
Glucose-Capillary: 178 mg/dL — ABNORMAL HIGH (ref 70–99)
Glucose-Capillary: 221 mg/dL — ABNORMAL HIGH (ref 70–99)

## 2012-10-05 MED ORDER — INSULIN GLARGINE 100 UNIT/ML ~~LOC~~ SOLN
12.0000 [IU] | Freq: Every day | SUBCUTANEOUS | Status: DC
  Administered 2012-10-05 – 2012-10-08 (×4): 12 [IU] via SUBCUTANEOUS

## 2012-10-05 MED ORDER — ZOLPIDEM TARTRATE 5 MG PO TABS
5.0000 mg | ORAL_TABLET | Freq: Every evening | ORAL | Status: DC | PRN
  Administered 2012-10-05 – 2012-10-21 (×5): 5 mg
  Filled 2012-10-05 (×7): qty 1

## 2012-10-05 NOTE — Progress Notes (Addendum)
No pain. Nurse reports fall yesterday- no injuries Patient does not remember fall He is asking about sister-in-law He is confused  Filed Vitals:   10/05/12 0004 10/05/12 0200 10/05/12 0420 10/05/12 0928  BP: 159/74  172/82   Pulse: 89 87 97   Temp: 98.3 F (36.8 C)  97.6 F (36.4 C)   TempSrc: Oral  Oral   Resp: 18 18 20    SpO2: 97% 98%  97%   CBG (last 3)   Basename 10/05/12 0816 10/05/12 0413 10/05/12 0006  GLUCAP 178* 163* 221*    elderly male in no acute distress. HEENT exam  - wearing neck brace, edentulous.  Chest clear to auscultation cardiac exam S1-S2 are regular. Abdominal exam thin with bowel sounds, soft and nontender. g- tube present. Extremities no edema. Neurologic exam is alert with a normal gait.  A/P 1. Functional deficits secondary to c3-4 fx s/p fusion with a central cord syndrome DVT Prophylaxis/Anticoagulation: SCDs. Monitor for any signs of DVT  3. Pain Management: Tylenol and Flexeril for spasms as needed. Will change to Robaxin and monitor  4. Mood/delirium/acute encephalopathy.  monitor for any signs of alcohol withdrawal. We'll use Ativan as needed as well as check sleep chart  5. Neuropsych: This patient is not capable of making decisions on his/her own behalf.  6. Dysphagia. Status post gastrostomy tube placement 10/01/2012 per Dr. Lindie Spruce. Plan followup speech therapy  7. Aspiration pneumonia/ respiratory failure. Intravenous Unasyn completed. Continue bronchodilators as advised and followup chest x-ray as needed.  8. Hypothyroidism. Synthroid  9. Diabetes mellitus. Check blood sugars every 4 hours while on tube feeds. May need scheduled insulin. Continue sliding scale as advised. Patient was on tradgenta 5 mg daily prior to admission Will adjust DM meds 10/05/2012- add Lantus

## 2012-10-05 NOTE — Progress Notes (Signed)
Occupational Therapy Session Note  Patient Details  Name: RAWLEIGH RODE MRN: [id number] Date of Birth: [birth year]  Today's Date: 10/05/2012 Time: 1017-1128 Time Calculation (min): 71 min  Skilled Therapeutic Interventions/Progress Updates:   ADL in w/c at sink after bed to chair transfer.  Patient with great difficulty supine to EOB but patient impressively able to exert more independence with bed to w/c transfer (he required Mod A of 1 person);  However, sitter stated that patient wanted to get OOB and perhaps that was why he contributed more effort for the transfer.   However, after the ADL and reported no sleep last night, patient required Max Assist w/c to bed transfer and total A x 2 for EOB to supine in bed.   Patient required total A x 2 for bed mobility following the transfer back to bed  Though patient was "in and out of it" cognitively during the b/d session.  He was able to report to this clinician a "10"/10 pain level "allover."  Dtr arrived toward end of session and was very supportive. Therapy Documentation Precautions:  Precautions Precautions: Fall Cervical Brace: Hard collar Restrictions Weight Bearing Restrictions: No  Pain:10/10 "allover" and constant though RN had given pain meds   See FIM for current functional status  Therapy/Group: Individual Therapy  Bud Face [facility] 10/05/2012, 4:20 PM

## 2012-10-05 NOTE — Plan of Care (Signed)
Problem: RH SAFETY Goal: RH STG ADHERE TO SAFETY PRECAUTIONS W/ASSISTANCE/DEVICE STG Adhere to Safety Precautions With Assistance/Device.  Outcome: Not Progressing Larey Seat today, now requires sitter

## 2012-10-05 NOTE — Progress Notes (Signed)
Discussed with Dr. Amador Cunas about order to maintain foley.  He stated to keep foley in place for now and remind rehab MDs to address whether to remove foley or not in AM.  Urine appears yellow and clear with no odor.  Site WNL.  Will continue to monitor.  Note written to MDs to address in AM.  Barrie Lyme 3:47 PM 10/05/2012

## 2012-10-06 ENCOUNTER — Inpatient Hospital Stay (HOSPITAL_COMMUNITY): Payer: PRIVATE HEALTH INSURANCE | Admitting: Speech Pathology

## 2012-10-06 ENCOUNTER — Inpatient Hospital Stay (HOSPITAL_COMMUNITY): Payer: PRIVATE HEALTH INSURANCE | Admitting: Occupational Therapy

## 2012-10-06 ENCOUNTER — Inpatient Hospital Stay (HOSPITAL_COMMUNITY): Payer: PRIVATE HEALTH INSURANCE

## 2012-10-06 LAB — GLUCOSE, CAPILLARY
Glucose-Capillary: 127 mg/dL — ABNORMAL HIGH (ref 70–99)
Glucose-Capillary: 158 mg/dL — ABNORMAL HIGH (ref 70–99)
Glucose-Capillary: 190 mg/dL — ABNORMAL HIGH (ref 70–99)

## 2012-10-06 LAB — COMPREHENSIVE METABOLIC PANEL
ALT: 50 U/L (ref 0–53)
AST: 97 U/L — ABNORMAL HIGH (ref 0–37)
Alkaline Phosphatase: 159 U/L — ABNORMAL HIGH (ref 39–117)
CO2: 28 mEq/L (ref 19–32)
Calcium: 9.1 mg/dL (ref 8.4–10.5)
Chloride: 105 mEq/L (ref 96–112)
GFR calc non Af Amer: >90 mL/min (ref >90)
Glucose, Bld: 163 mg/dL — ABNORMAL HIGH (ref 70–99)
Potassium: 3.5 mEq/L (ref 3.5–5.1)
Sodium: 143 mEq/L (ref 135–145)
Total Bilirubin: 0.7 mg/dL (ref 0.3–1.2)

## 2012-10-06 LAB — CBC WITH DIFFERENTIAL/PLATELET
Basophils Absolute: 0 10*3/uL (ref 0.0–0.1)
Lymphocytes Relative: 11 % — ABNORMAL LOW (ref 12–46)
Lymphs Abs: 1.2 10*3/uL (ref 0.7–4.0)
Neutro Abs: 8.2 10*3/uL — ABNORMAL HIGH (ref 1.7–7.7)
Platelets: 169 10*3/uL (ref 150–400)
RBC: 4.11 MIL/uL — ABNORMAL LOW (ref 4.22–5.81)
RDW: 15.4 % (ref 11.5–15.5)
WBC: 11.1 10*3/uL — ABNORMAL HIGH (ref 4.0–10.5)

## 2012-10-06 MED ORDER — JEVITY 1.2 CAL PO LIQD
1000.0000 mL | ORAL | Status: DC
  Administered 2012-10-06: 1000 mL
  Filled 2012-10-06 (×5): qty 1000

## 2012-10-06 MED ORDER — FOLIC ACID 1 MG PO TABS
1.0000 mg | ORAL_TABLET | Freq: Every day | ORAL | Status: DC
  Administered 2012-10-06 – 2012-10-17 (×12): 1 mg
  Filled 2012-10-06 (×15): qty 1

## 2012-10-06 MED ORDER — BISACODYL 10 MG RE SUPP
10.0000 mg | Freq: Every day | RECTAL | Status: AC | PRN
  Administered 2012-10-13: 10 mg via RECTAL
  Filled 2012-10-06: qty 1

## 2012-10-06 MED ORDER — QUETIAPINE FUMARATE 50 MG PO TABS
50.0000 mg | ORAL_TABLET | Freq: Every day | ORAL | Status: DC
  Administered 2012-10-06 – 2012-11-02 (×26): 50 mg via ORAL
  Filled 2012-10-06 (×30): qty 1

## 2012-10-06 MED ORDER — VITAMIN B-1 100 MG PO TABS
100.0000 mg | ORAL_TABLET | Freq: Every day | ORAL | Status: DC
  Administered 2012-10-06 – 2012-11-03 (×28): 100 mg
  Filled 2012-10-06 (×33): qty 1

## 2012-10-06 MED ORDER — TRAZODONE HCL 50 MG PO TABS: 300.0000 mg | ORAL_TABLET | Freq: Every day | ORAL | Status: DC

## 2012-10-06 MED ORDER — LEVOTHYROXINE SODIUM 88 MCG PO TABS
88.0000 ug | ORAL_TABLET | Freq: Every day | ORAL | Status: DC
  Administered 2012-10-06 – 2012-11-03 (×27): 88 ug
  Filled 2012-10-06 (×30): qty 1

## 2012-10-06 NOTE — Plan of Care (Signed)
Problem: RH SAFETY Goal: RH STG ADHERE TO SAFETY PRECAUTIONS W/ASSISTANCE/DEVICE STG Adhere to Safety Precautions With Assistance/Device.  Outcome: Not Progressing Requiring 1:1 at present for safety

## 2012-10-06 NOTE — Progress Notes (Signed)
Occupational Therapy Session Notes  Patient Details  Name: [name] MRN: [id number] Date of Birth: [birth year]  Today's Date: 10/06/2012  Short Term Goals: Week 1:  OT Short Term Goal 1 (Week 1): Pt will sit EOB to don shirt with steady A OT Short Term Goal 2 (Week 1): Pt will don shirt with mod A OT Short Term Goal 3 (Week 1): Pt will transfer to toilet / BSC with mod A OT Short Term Goal 4 (Week 1): Pt and therapist will explore options for donning LB clothing with AE/ apaptive methods OT Short Term Goal 5 (Week 1): Pt will be able to pull down pants for toileting with steady A  Skilled Therapeutic Interventions/Progress Updates:   Session #1 1000-1030 - 30 Minutes Amount of Missed OT Time (min): 30 Minutes Individual Therapy No complaints of pain Upon entering room patient found supine in bed. Initially therapist tried getting patient to sit edge of bed and patient refused, "not right now, tomorrow.". Patient then stated he needed to use bathroom. Therapist gathered drop arm BSC for patient to transfer onto. Patient sat edge of bed with max assist and transferred onto [facility] with mod assist. Patient required total assist for toileting aspects; clothing management and peri care. After toileting patient transferred back to bed with mod assist, perform stand pivot transfer. During transfers patient squatted and yelled at therapist "Im doing it!" when therapist used verbal cues to try to get patient to stand up tall/straight. Once seated edge of bed therapist attempted to have patient functional use right hand/UE to wash face, patient refused stating, "NO! I'm laying back down!". Therapist assisted patient with bed mobility and bed positioning and left patient supine in bed with sitter present in room.   Session #2 2130-8657 - 40 Minutes Individual Therapy No complaints of pain Patient found supine in bed. When therapist entered room patient with statements, "I feel like shit.Marland KitchenMarland KitchenI'm hot as  hell.". Patient initially refused to get OOB, but after about 5 minutes of therapist discussing importance of therapy patient stated he needed to use bathroom. Therapist brought out [facility] and transferred patient edge of bed -> BSC with moderate assistance. After toileting patient insisted on getting back to bed instead of therapists recommendation of sitting in recliner. Therapist assisted with donning brief while engaging patient in bed mobility exercises. At end of session left patient in left side-lying position with call bell within reach. Patient with difficult time pushing call bell, recommend soft call bell to help increase independence.   Precautions:  Precautions Precautions: Fall Cervical Brace: Hard collar Restrictions Weight Bearing Restrictions: No  See FIM for current functional status  Klever Twyford 10/06/2012, 10:40 AM

## 2012-10-06 NOTE — Progress Notes (Signed)
Per report, patient was awake all night Friday night, Saturday day and per this RN has not slept at all Sat. Night or Sun. Night and has required 1:1 during this time   .ativan and robaxin given Sat night.Marland KitchenMarland KitchenAmbien and tylenol given Sun night with no effect.

## 2012-10-06 NOTE — Progress Notes (Signed)
Speech Language Pathology Daily Session Note  Patient Details  Name: [name] MRN: [id number] Date of Birth: 1931-04-23  Today's Date: 10/06/2012 Time: 0850-0930 Time Calculation (min): 40 min  Short Term Goals: Week 1: SLP Short Term Goal 1 (Week 1): Pt will demonstrate improved management of secretions with mod assist to expectorate/use oral suctioning. SLP Short Term Goal 2 (Week 1): Pt will demonstrate use of laryngeal closure techniques (eg. supraglottic swallow, glottal closure) in an effort to assist with airway protection during functional swallowing with max assist.  SLP Short Term Goal 3 (Week 1): Pt will sustain attention to a functional task for ~5 minutes with Max A verbal cues.  SLP Short Term Goal 4 (Week 1): Pt will demonstrate functional problem solving for functional and familiar tasks with Max A verbal and question cues.   Skilled Therapeutic Interventions: Treatment session focused on addressing cognitive goals. SLP facilitated session by providing max verbal cues for sustained attention to task, however, pt perseverative on using the bathroom. Pt placed on bedpan and required Max verbal cues for functional problem solving and Mod verbal cues to sustain attention to task. Pt with increased lethargy and fatigue at end of session and clinician provided oral care.    FIM:  Comprehension Comprehension Mode: Auditory Comprehension: 2-Understands basic 25 - 49% of the time/requires cueing 51 - 75% of the time Expression Expression Mode: Verbal Expression: 3-Expresses basic 50 - 74% of the time/requires cueing 25 - 50% of the time. Needs to repeat parts of sentences. Social Interaction Social Interaction: 2-Interacts appropriately 25 - 49% of time - Needs frequent redirection. Problem Solving Problem Solving: 2-Solves basic 25 - 49% of the time - needs direction more than half the time to initiate, plan or complete simple activities Memory Memory: 3-Recognizes or  recalls 50 - 74% of the time/requires cueing 25 - 49% of the time FIM - Eating Eating Activity: 1: Helper performs IV, parenteral, or tube feeding  Pain Pain Assessment Pain Assessment: No/denies pain  Therapy/Group: Individual Therapy  Clea Dubach 10/06/2012, 9:58 AM

## 2012-10-06 NOTE — Progress Notes (Signed)
Physical Therapy Session Note  Patient Details  Name: HOSIE SHARMAN MRN: [id number] Date of Birth: August 16, 1931  Today's Date: 10/06/2012 Time: 7829-5621 Time Calculation (min): 42 min  Skilled Therapeutic Interventions/Progress Updates:    Pt presents in supine, pleasant but refusing OOB/therapy. Pt with report to need to have BM and agreeable to transfer to [facility]. Stand pivot with mod A for sit to stand. Pt already smearing on the sheet, but had small BM toilet. Sitting EOB and on BSC with S. Required max A for supine to sit but able to go sit to supine with S and refused to stay OOB. Pt also refused to reposition in the bed to change linen, bed alarm on and nurse tech in room.  Therapy Documentation Precautions:  Precautions Precautions: Fall Cervical Brace: Hard collar Restrictions Weight Bearing Restrictions: No  Pain: Pain Assessment Pain Assessment: No/denies pain  See FIM for current functional status  Therapy/Group: Individual Therapy  Karolee Stamps [facility] 10/06/2012, 12:10 PM

## 2012-10-06 NOTE — Progress Notes (Signed)
Nutrition Follow-up  Intervention:   1. Recommend obtaining more frequent weights to help assess TF adequacy 2. To better meet demands of therapy schedule, will change TF to Jevity 1.2 at 70 ml/hr x 20 hours. This will provide 1680 kcal, 78 grams protein and 1130 ml free water. Continues current free water regimen of 140 ml QID. 3. RD to continue to follow  Assessment:   Per RN, pt is tolerating TF well at this time. Noted recent elevated CBGs.  Skin: No skin breakdown noted. Notable labs: Sodium is now WNL. CBG's 127 - 190. Pertinent meds: folvite, novolog, lantus, synthroid, thiamine Last BM: 9/26  Diet Order:  NPO TF: Jevity 1.2 at 60 ml/hr x 24 hours; provides 1728 kcal, 81 grams protein, 1162 ml free water; meets 100% of kcal and 100% protein needs Free water: 140 ml QID; provides 560 ml water Total water provision is 1722 ml daily (31 ml/kg)  Meds: Scheduled Meds:   . albuterol  2.5 mg Nebulization Q6H  . antiseptic oral rinse  15 mL Mouth Rinse QID  . aspirin  81 mg Per Tube Daily  . budesonide  0.5 mg Nebulization BID  . folic acid  1 mg Per Tube Daily  . free water  140 mL Per Tube Q6H  . insulin aspart  2-6 Units Subcutaneous Q4H  . insulin glargine  12 Units Subcutaneous Q breakfast  . ipratropium  0.5 mg Nebulization Q6H  . levothyroxine  88 mcg Per Tube QAC breakfast  . thiamine  100 mg Per Tube Daily  . traZODone  300 mg Oral QHS  . DISCONTD: folic acid  1 mg Intravenous Daily  . DISCONTD: levothyroxine  44 mcg Intravenous QAC breakfast  . DISCONTD: thiamine  100 mg Intravenous Daily   Continuous Infusions:   . feeding supplement (JEVITY 1.2 CAL)    . DISCONTD: feeding supplement (JEVITY 1.2 CAL) 60 mL/hr at 10/05/12 2232   PRN Meds:.acetaminophen, acetaminophen, bisacodyl, LORazepam, methocarbamol, ondansetron (ZOFRAN) IV, ondansetron, polyethylene glycol, sorbitol, zolpidem  Labs:  CMP     Component Value Date/Time   NA 143 10/06/2012 0545   K 3.5  10/06/2012 0545   CL 105 10/06/2012 0545   CO2 28 10/06/2012 0545   GLUCOSE 163* 10/06/2012 0545   BUN 24* 10/06/2012 0545   CREATININE 0.66 10/06/2012 0545   CALCIUM 9.1 10/06/2012 0545   PROT 6.4 10/06/2012 0545   ALBUMIN 2.0* 10/06/2012 0545   AST 97* 10/06/2012 0545   ALT 50 10/06/2012 0545   ALKPHOS 159* 10/06/2012 0545   BILITOT 0.7 10/06/2012 0545   GFRNONAA >90 10/06/2012 0545   GFRAA >90 10/06/2012 0545   CBG (last 3)   Basename 10/06/12 0822 10/06/12 0418 10/06/12 0005  GLUCAP 127* 179* 190*     Intake/Output Summary (Last 24 hours) at 10/06/12 1034 Last data filed at 10/06/12 1610  Gross per 24 hour  Intake   1140 ml  Output   1350 ml  Net   -210 ml   Weight Status:  56.4 kg - no new wt (10/3)  BMI is 18.4 - underweight  Estimated needs:  1650 - 1820 kcal, 77 - 88 grams protein  Nutrition Dx:  Swallowing difficulty r/t cervical fracture AEB PEG tube required for nutrition. Ongoing.  Goal:  Meet 90-100% estimated nutrition needs with EN - met.  Monitor:  TF rate/tolerance, weight, labs  Jarold Motto MS, RD, LDN Pager: [phone] After-hours pager: [phone]
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
## 2012-10-06 NOTE — Progress Notes (Signed)
I spoke with the patients daughter [name] in regards to the safety sitter and the safety plan for her father while he is in inpatient rehab.  We will be using the safety plan and interventions for Inpatient Rehab that do not include safety sitters.  She understands and stated that she would do whatever is necessary to help her dad while he is here with [place].  She also stated that she would be available to stay overnights and I informed her that this would fine and that we would be able to accommodate her with a roll-a-way bed if needed.  Will be discontinuing the safety sitter at 11AM.  Patient will be monitored closely.  Reviewed information with staff including assigned nurse [facility] and Chana Bode charge nurse.

## 2012-10-07 ENCOUNTER — Inpatient Hospital Stay (HOSPITAL_COMMUNITY): Payer: PRIVATE HEALTH INSURANCE

## 2012-10-07 ENCOUNTER — Inpatient Hospital Stay (HOSPITAL_COMMUNITY): Payer: PRIVATE HEALTH INSURANCE | Admitting: *Deleted

## 2012-10-07 ENCOUNTER — Inpatient Hospital Stay (HOSPITAL_COMMUNITY): Payer: PRIVATE HEALTH INSURANCE | Admitting: Speech Pathology

## 2012-10-07 ENCOUNTER — Inpatient Hospital Stay (HOSPITAL_COMMUNITY): Payer: PRIVATE HEALTH INSURANCE | Admitting: Occupational Therapy

## 2012-10-07 DIAGNOSIS — J189 Pneumonia, unspecified organism: Secondary | ICD-10-CM | POA: Diagnosis not present

## 2012-10-07 DIAGNOSIS — R1310 Dysphagia, unspecified: Secondary | ICD-10-CM

## 2012-10-07 DIAGNOSIS — Z5189 Encounter for other specified aftercare: Secondary | ICD-10-CM

## 2012-10-07 DIAGNOSIS — J690 Pneumonitis due to inhalation of food and vomit: Secondary | ICD-10-CM

## 2012-10-07 DIAGNOSIS — S069X9A Unspecified intracranial injury with loss of consciousness of unspecified duration, initial encounter: Secondary | ICD-10-CM

## 2012-10-07 DIAGNOSIS — IMO0002 Reserved for concepts with insufficient information to code with codable children: Secondary | ICD-10-CM

## 2012-10-07 LAB — URINE MICROSCOPIC-ADD ON

## 2012-10-07 LAB — URINALYSIS, ROUTINE W REFLEX MICROSCOPIC
Bilirubin Urine: NEGATIVE
Hgb urine dipstick: NEGATIVE
Protein, ur: NEGATIVE mg/dL
Urobilinogen, UA: 1 mg/dL (ref 0.0–1.0)

## 2012-10-07 LAB — GLUCOSE, CAPILLARY
Glucose-Capillary: 156 mg/dL — ABNORMAL HIGH (ref 70–99)
Glucose-Capillary: 181 mg/dL — ABNORMAL HIGH (ref 70–99)
Glucose-Capillary: 234 mg/dL — ABNORMAL HIGH (ref 70–99)

## 2012-10-07 MED ORDER — VANCOMYCIN HCL 1000 MG IV SOLR
1250.0000 mg | INTRAVENOUS | Status: DC
  Administered 2012-10-07 – 2012-10-10 (×4): 1250 mg via INTRAVENOUS
  Filled 2012-10-07 (×5): qty 1250

## 2012-10-07 MED ORDER — WHITE PETROLATUM GEL
Status: AC
  Administered 2012-10-07: 16:00:00
  Filled 2012-10-07: qty 5

## 2012-10-07 MED ORDER — PRO-STAT SUGAR FREE PO LIQD
30.0000 mL | Freq: Every day | ORAL | Status: DC
  Administered 2012-10-07 – 2012-10-17 (×10): 30 mL
  Filled 2012-10-07 (×13): qty 30

## 2012-10-07 MED ORDER — BUDESONIDE 0.5 MG/2ML IN SUSP
0.5000 mg | Freq: Two times a day (BID) | RESPIRATORY_TRACT | Status: DC
  Administered 2012-10-07 – 2012-11-03 (×37): 0.5 mg via RESPIRATORY_TRACT
  Filled 2012-10-07 (×58): qty 2

## 2012-10-07 MED ORDER — ALBUTEROL SULFATE (5 MG/ML) 0.5% IN NEBU
2.5000 mg | INHALATION_SOLUTION | RESPIRATORY_TRACT | Status: DC | PRN
  Administered 2012-10-07 – 2012-10-09 (×2): 2.5 mg via RESPIRATORY_TRACT
  Filled 2012-10-07 (×3): qty 0.5

## 2012-10-07 MED ORDER — OSMOLITE 1.5 CAL PO LIQD
1000.0000 mL | ORAL | Status: DC
  Administered 2012-10-07 – 2012-10-08 (×2): 1000 mL
  Filled 2012-10-07 (×5): qty 1000

## 2012-10-07 MED ORDER — PIPERACILLIN-TAZOBACTAM 3.375 G IVPB
3.3750 g | Freq: Three times a day (TID) | INTRAVENOUS | Status: DC
  Administered 2012-10-07 – 2012-10-10 (×9): 3.375 g via INTRAVENOUS
  Filled 2012-10-07 (×11): qty 50

## 2012-10-07 MED ORDER — FREE WATER
200.0000 mL | Freq: Four times a day (QID) | Status: DC
  Administered 2012-10-07 – 2012-10-10 (×11): 200 mL
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
## 2012-10-07 NOTE — Progress Notes (Signed)
Physical Therapy Session Note  Patient Details  Name: [name] MRN: [id number] Date of Birth: 1931/11/29  Today's Date: 10/07/2012 Time: 4098-1191 Time Calculation (min): 15 min  Short Term Goals: Week 1:  PT Short Term Goal 1 (Week 1): Pt will transfer sit to stand to sit 3/4 trials for improved transfers and activity tolerance PT Short Term Goal 2 (Week 1): Pt will gait 20 ft total A x 2 pt =50% controlled environement PT Short Term Goal 3 (Week 1): Pt will perform bed mobility supine to sit min A in preparation for OOB transfer PT Short Term Goal 4 (Week 1): Pt will propel w/c with LE's 50 ft controlled environment min A for mobility, activity tolerance and LE strengthening  Skilled Therapeutic Interventions/Progress Updates:    Pt presents in supine and alert, but only speaking in a whisper. Pulmonologist came in to assess pt and PA also in the room (missing 15 minutes initially of session). After discussion with doctors, pt agreed to transfer OOB with therapist. When attempting to sit EOB, pt required total A and fighting against therapist to sit EOB for supine to sit. Attempted 3 times and discussed the importance of OOB for lung function, endurance, strength but pt continuing to resist movement and verbally refused. Repositioned back in the bed with HOB elevated and RN notified of pt's unwillingness to get OOB/participate.  Therapy Documentation Precautions:  Precautions Precautions: Fall Cervical Brace: Hard collar Restrictions Weight Bearing Restrictions: No General: Amount of Missed PT Time (min): 15 Minutes Missed Time Reason: Other (comment) (see note) Pain: Reports having pain all over but did not rate. Premedicated. Denies having chills or feeling achy/feverish or SOB.  See FIM for current functional status  Therapy/Group: Individual Therapy  Karolee Stamps [facility] 10/07/2012, 11:59 AM

## 2012-10-07 NOTE — Progress Notes (Signed)
Per respiratory therapist, pt refused remaining respiratory treatment. Pt stated, 'get that off of me'. Therapist also stated that pt has been unable to sustain flutter valve, and was unable to perform this aspect of respiratory care.

## 2012-10-07 NOTE — Progress Notes (Signed)
Nutrition Follow-up/Consult  Intervention:   1. To reduce total volume of enteral nutrition, will change TF to Osmolite 1.5 at 60 ml/hr x 20 hours. 30 ml Prostat per tube daily. This will provide 1900 kcal, 91 grams protein and 914 ml free water. Increase current free water regimen to 200 ml QID. Total water provision will be 1714 ml daily. 2. RD to continue to follow  Assessment:   PA consulted RD to change enteral nutrition formula to more concentrated formula to reduce volume. Noted that PCCM was called for CXR changes, concerning for PNA. Discussed change of enteral nutrition with CCM PA, Canary Brim.  Skin: skin remains intact Notable labs: labs reviewed, CBG's 157-234 Pertinent meds: folvite, novolog, lantus, synthroid, thiamine Last BM: 10/7  Diet Order:  NPO TF: Jevity 1.2 at 70 ml/hr x 20 hours; provides 1680 kcal, 78 grams protein, 1130 ml free water; meets 100% of kcal and 100% protein needs Free water: 140 ml QID; provides 560 ml water Total water provision is 1690 ml daily (30 ml/kg)  Meds: Scheduled Meds:    . albuterol  2.5 mg Nebulization Q6H  . antiseptic oral rinse  15 mL Mouth Rinse QID  . aspirin  81 mg Per Tube Daily  . budesonide  0.5 mg Nebulization BID  . folic acid  1 mg Per Tube Daily  . free water  140 mL Per Tube Q6H  . insulin aspart  2-6 Units Subcutaneous Q4H  . insulin glargine  12 Units Subcutaneous Q breakfast  . ipratropium  0.5 mg Nebulization Q6H  . levothyroxine  88 mcg Per Tube QAC breakfast  . piperacillin-tazobactam (ZOSYN)  IV  3.375 g Intravenous Q8H  . QUEtiapine  50 mg Oral QHS  . thiamine  100 mg Per Tube Daily  . DISCONTD: budesonide  0.5 mg Nebulization BID   Continuous Infusions:    . feeding supplement (JEVITY 1.2 CAL) 1,000 mL (10/06/12 2231)   PRN Meds:.acetaminophen, acetaminophen, albuterol, bisacodyl, LORazepam, methocarbamol, ondansetron (ZOFRAN) IV, ondansetron, polyethylene glycol, sorbitol, zolpidem  Labs:  CMP     Component Value Date/Time   NA 143 10/06/2012 0545   K 3.5 10/06/2012 0545   CL 105 10/06/2012 0545   CO2 28 10/06/2012 0545   GLUCOSE 163* 10/06/2012 0545   BUN 24* 10/06/2012 0545   CREATININE 0.66 10/06/2012 0545   CALCIUM 9.1 10/06/2012 0545   PROT 6.4 10/06/2012 0545   ALBUMIN 2.0* 10/06/2012 0545   AST 97* 10/06/2012 0545   ALT 50 10/06/2012 0545   ALKPHOS 159* 10/06/2012 0545   BILITOT 0.7 10/06/2012 0545   GFRNONAA >90 10/06/2012 0545   GFRAA >90 10/06/2012 0545   CBG (last 3)   Basename 10/07/12 1123 10/07/12 0811 10/07/12 0416  GLUCAP 157* 234* 180*     Intake/Output Summary (Last 24 hours) at 10/07/12 1218 Last data filed at 10/07/12 0700  Gross per 24 hour  Intake   1280 ml  Output    600 ml  Net    680 ml   Weight Status:  56.4 kg - no new wt (10/3)  BMI is 18.4 - underweight  Estimated needs:  1650 - 1820 kcal, 77 - 88 grams protein  Nutrition Dx:  Swallowing difficulty r/t cervical fracture AEB PEG tube required for nutrition. Ongoing.  Goal:  Meet 90-100% estimated nutrition needs with EN - met.  Monitor:  TF rate/tolerance, weight, labs  Jarold Motto MS, RD, LDN Pager: [phone] After-hours pager: [phone]
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
## 2012-10-07 NOTE — Progress Notes (Signed)
Occupational Therapy Note  Patient Details  Name: [name] MRN: [id number] Date of Birth: [birth year] Today's Date: 10/07/2012  1000-1025 - 25 Minutes Patient missed 35 minutes of skilled occupational therapy Patient with complaints of pain "all over" body, patient did not give pain rating Individual Therapy Patient found supine in bed, with UB shaking. Therapist encouraged patient to participate & sit edge of bed, patient barely able to respond. When asked how he was feeling patient would state "terrible". When asked to explain why he felt terrible, patient unable to communicate explanation. Patient stated he needed to use restroom while therapist present, so therapist got BSC set-up and when therapist tried getting patient OOB he refused and would not move. RN notified of patients behaviors. 02 sats =86% on room air, therapist donned nasal cannula at 2 liters and 02 sats =92%, HR =136, Temperature =100.3. Secondary to unresponsive behaviors and fatigue, left patient supine in bed with call bell within reach.   [name] 10/07/2012, 10:30 AM
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
## 2012-10-07 NOTE — Progress Notes (Signed)
Late entry: Pt returned back from chest xray around 8:15, complained of being cold, and visibly shivering.  Verbalized 'feel worse that I have come back, then when I went down'. Temp taken, 98.8. 02 sats at 97% RA via probe being placed on pt's earlobe. Pt requested blankets and temp in room be increased. Also requested pain medication. Robaxin 500mg  given for visible spasms.   At 10:15, called to pt's room by therapy. Therapy reported initial 02 sats @ 86% RA, and HR of 136. Pt alert and orientated, but could only speak in a whisper. Harvel Ricks, PA made aware. Verbal order to initial Zosyn, and written order for Albuterol treatment, prn. Respiratory contacted, albuterol treatment given. Per Harvel Ricks, pulmonary physician's assistant contacted for consult, and stated that they would come up to check on pt.Marland Kitchen

## 2012-10-07 NOTE — Progress Notes (Signed)
Speech Language Pathology Daily Session Note  Patient Details  Name: [name] MRN: [id number] Date of Birth: 10/24/1931  Today's Date: 10/07/2012 Time: 4098-1191 Time Calculation (min): 45 min  Short Term Goals: Week 1: SLP Short Term Goal 1 (Week 1): Pt will demonstrate improved management of secretions with mod assist to expectorate/use oral suctioning. SLP Short Term Goal 1 - Progress (Week 1): Progressing toward goal SLP Short Term Goal 2 (Week 1): Pt will demonstrate use of laryngeal closure techniques (eg. supraglottic swallow, glottal closure) in an effort to assist with airway protection during functional swallowing with max assist.  SLP Short Term Goal 2 - Progress (Week 1): Not progressing SLP Short Term Goal 3 (Week 1): Pt will sustain attention to a functional task for ~5 minutes with Max A verbal cues.  SLP Short Term Goal 3 - Progress (Week 1): Progressing toward goal SLP Short Term Goal 4 (Week 1): Pt will demonstrate functional problem solving for functional and familiar tasks with Max A verbal and question cues.  SLP Short Term Goal 4 - Progress (Week 1): Progressing toward goal  Skilled Therapeutic Interventions: Pt. seen for cognitive and dysphagia therapy.  Following oral care, pt. administered single ice chips with total assist. Significantly delayed swallow initiation exhibited with decreased laryngeal elevation and coordination.  Consistent mild-mod throat clears indicative of likely decreased airway protection.  Volitional swallow attempts to clear probable residue were only 20% successful.  As session progressed, pt. verbalizing pain and requesting meds.  He became increasingly lethargic and SLP learned pt. was administered pain meds within 50 minutes before ST session.  He required max verbal/tactile cues sustain attention to SLP and respond to questions.  Toward the mid-end of session, expressive verbalizations consisted of primarily humming noises, part word  repetitions and irrelevant utterances.       FIM:  Comprehension Comprehension: 2-Understands basic 25 - 49% of the time/requires cueing 51 - 75% of the time Expression Expression: 1-Expresses basis less than 25% of the time/requires cueing greater than 75% of the time. Social Interaction Social Interaction: 2-Interacts appropriately 25 - 49% of time - Needs frequent redirection. Problem Solving Problem Solving: 1-Solves basic less than 25% of the time - needs direction nearly all the time or does not effectively solve problems and may need a restraint for safety Memory Memory: 1-Recognizes or recalls less than 25% of the time/requires cueing greater than 75% of the time FIM - Eating Eating Activity: 1: Helper feeds patient (trials ice chips)  Pain    Therapy/Group: Individual Therapy  Royce Macadamia 10/07/2012, 3:21 PM

## 2012-10-07 NOTE — Progress Notes (Signed)
Physical Therapy Session Note  Patient Details  Name: [name] MRN: [id number] Date of Birth: 1931/01/01  Today's Date: 10/07/2012 Time: 1400-1415 Missed 45 minutes  Short Term Goals: Week 1:  PT Short Term Goal 1 (Week 1): Pt will transfer sit to stand to sit 3/4 trials for improved transfers and activity tolerance PT Short Term Goal 2 (Week 1): Pt will gait 20 ft total A x 2 pt =50% controlled environement PT Short Term Goal 3 (Week 1): Pt will perform bed mobility supine to sit min A in preparation for OOB transfer PT Short Term Goal 4 (Week 1): Pt will propel w/c with LE's 50 ft controlled environment min A for mobility, activity tolerance and LE strengthening  Skilled Therapeutic Interventions/Progress Updates:    Pt in bed, nurse stated that PT could attempt therapy although participation today has been limited.  Pt difficulty keeping eyes open and speech/whisper difficult to understand. Pt declined OOB, eventually agreed to allow P/ROM but poor tolerance for this and would ask to stop after approx 5 reps; did not tolerate stretch to tight ER in hips; did ROM on UE and LE as tolerated, HR was 104; no c/o pain at rest but appeared to have discomfort with P/ROM  Therapy Documentation Precautions:  Precautions Precautions: Fall Cervical Brace: Hard collar Restrictions Weight Bearing Restrictions: No  See FIM for current functional status  Therapy/Group: Individual Therapy  Michaelene Song 10/07/2012, 3:21 PM

## 2012-10-07 NOTE — Patient Care Conference (Signed)
Inpatient RehabilitationTeam Conference Note Date: 10/07/2012   Time: 2:15 PM    Patient Name: [name]      Medical Record Number: [id number]  Date of Birth: 19-Jul-1931 Sex: Male         Room/Bed: 4001/4001-01 Payor Info: Payor: Advertising copywriter MEDICARE  Plan: [facility]  Product Type: *No Product type*     Admitting Diagnosis: central cord peg  Admit Date/Time:  10/03/2012  2:45 PM Admission Comments: No comment available   Primary Diagnosis:  Central cord syndrome Principal Problem: Central cord syndrome  Patient Active Problem List   Diagnosis Date Noted  . HAP (hospital-acquired pneumonia) 10/07/2012  . Central cord syndrome 10/03/2012  . Cervical spine fracture 09/26/2012  . Respiratory failure, post-operative 09/26/2012  . HCC (hepatocellular carcinoma) 04/08/2012  . 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 DM (diabetes mellitus) 04/08/2012  . Hypertension 04/08/2012  . Hypothyroid 04/08/2012  . Alcohol abuse, in remission 04/08/2012    Expected Discharge Date: Expected Discharge Date: 10/24/12  Team Members Present: Physician: Dr. Faith Rogue Social Worker Present: Amada Jupiter, LCSW Nurse Present: Daryll Brod, RN PT Present: Karolee Stamps, PT OT Present: Edwin Cap, Loistine Chance, OT SLP Present: Other (comment) Ferdinand Lango, ST) Other (Discipline and Name): Tora Duck, PPS Coordinator     Current Status/Progress Goal Weekly Team Focus  Medical   confusion especially at night. coughing more, aspiration pneumonia  modify TF regimen, rigid aspiration precautions, manage infection  see above   Bowel/Bladder   Foley to straight drain. Continent of bowel. Last bowel movement 10/06/12  Continent of bowel and bladder  Discuss removal of foley   Swallow/Nutrition/ Hydration   NPO  supervision with least restrictive diet  trials of ice chips, increased management of secretions, laryngeal closure techniques    ADL's   overall total assist  supervision -> min  assist  ADL retraining, functional use of bilateral UEs, ADL transfers, overall activity tolerance/endurance, overall functional strengthening   Mobility   mod A transfers, have been unable to assess gait or further mobility due to pt's refusal to participate  S/min A overall  particpation, functional strengthening and endurance, transfers, gait   Communication             Safety/Cognition/ Behavioral Observations  Max A  Min A  attention, awareness, problem solving    Pain   Tylenol 650mg  q 4hrs prn  <3  Offer pain medication prior to initial therapy schedule   Skin   Peg to LUQ, Posterior neck incision, unremarkable, Scab to L outer knee, sacrum red, blanchable  No additional skin breakdown  Turn q 2hrs. Monitor peg site q shift    Rehab Goals Patient on target to meet rehab goals: Yes *See Interdisciplinary Assessment and Plan and progress notes for long and short-term goals  Barriers to Discharge: profound neurological deficits with UE weakness and confusion    Possible Resolutions to Barriers:  supervision and assistance at home especially with nutrition and ADL's,     Discharge Planning/Teaching Needs:  Likely to return home with caregiver who can provide 24/7 supervision with possibility of some light minimal assistance.  She does ask about "how much help will Medicare put it?"      Team Discussion:  Limited start with therapies due to medical issues and patient refusals. Difficult to fully assess his capabilities.  Need to address immediate medical issues (PNA) and review with family about anticipated d/c care needs.  Revisions to Treatment Plan:  None at this point.  Continued Need for Acute Rehabilitation Level of Care: The patient requires daily medical management by a physician with specialized training in physical medicine and rehabilitation for the following conditions: Daily direction of a multidisciplinary physical rehabilitation program to ensure safe treatment  while eliciting the highest outcome that is of practical value to the patient.: Yes Daily medical management of patient stability for increased activity during participation in an intensive rehabilitation regime.: Yes Daily analysis of laboratory values and/or radiology reports with any subsequent need for medication adjustment of medical intervention for : Pulmonary problems;Post surgical problems;Neurological problems;Other  Shenice Dolder 10/07/2012, 3:53 PM

## 2012-10-08 ENCOUNTER — Inpatient Hospital Stay (HOSPITAL_COMMUNITY): Payer: PRIVATE HEALTH INSURANCE

## 2012-10-08 ENCOUNTER — Inpatient Hospital Stay (HOSPITAL_COMMUNITY): Payer: PRIVATE HEALTH INSURANCE | Admitting: Occupational Therapy

## 2012-10-08 ENCOUNTER — Inpatient Hospital Stay (HOSPITAL_COMMUNITY): Payer: PRIVATE HEALTH INSURANCE | Admitting: Speech Pathology

## 2012-10-08 DIAGNOSIS — IMO0002 Reserved for concepts with insufficient information to code with codable children: Secondary | ICD-10-CM

## 2012-10-08 DIAGNOSIS — Z5189 Encounter for other specified aftercare: Secondary | ICD-10-CM

## 2012-10-08 DIAGNOSIS — R1310 Dysphagia, unspecified: Secondary | ICD-10-CM

## 2012-10-08 DIAGNOSIS — S069X9A Unspecified intracranial injury with loss of consciousness of unspecified duration, initial encounter: Secondary | ICD-10-CM

## 2012-10-08 LAB — URINE CULTURE
Colony Count: NO GROWTH
Culture: NO GROWTH

## 2012-10-08 LAB — GLUCOSE, CAPILLARY
Glucose-Capillary: 187 mg/dL — ABNORMAL HIGH (ref 70–99)
Glucose-Capillary: 210 mg/dL — ABNORMAL HIGH (ref 70–99)
Glucose-Capillary: 214 mg/dL — ABNORMAL HIGH (ref 70–99)
Glucose-Capillary: 221 mg/dL — ABNORMAL HIGH (ref 70–99)
Glucose-Capillary: 241 mg/dL — ABNORMAL HIGH (ref 70–99)
Glucose-Capillary: 254 mg/dL — ABNORMAL HIGH (ref 70–99)

## 2012-10-08 MED ORDER — INSULIN GLARGINE 100 UNIT/ML ~~LOC~~ SOLN
10.0000 [IU] | Freq: Two times a day (BID) | SUBCUTANEOUS | Status: DC
  Administered 2012-10-08 – 2012-10-09 (×3): 10 [IU] via SUBCUTANEOUS

## 2012-10-08 MED ORDER — SALINE SPRAY 0.65 % NA SOLN
1.0000 | NASAL | Status: DC | PRN
  Administered 2012-10-11: 1 via NASAL
  Filled 2012-10-08: qty 44

## 2012-10-08 MED ORDER — INSULIN ASPART 100 UNIT/ML ~~LOC~~ SOLN
0.0000 [IU] | Freq: Every day | SUBCUTANEOUS | Status: DC
  Administered 2012-10-08: 2 [IU] via SUBCUTANEOUS

## 2012-10-08 MED ORDER — INSULIN ASPART 100 UNIT/ML ~~LOC~~ SOLN
0.0000 [IU] | SUBCUTANEOUS | Status: DC
  Administered 2012-10-08: 5 [IU] via SUBCUTANEOUS
  Administered 2012-10-09: 2 [IU] via SUBCUTANEOUS
  Administered 2012-10-09: 3 [IU] via SUBCUTANEOUS
  Administered 2012-10-09 (×2): 2 [IU] via SUBCUTANEOUS
  Administered 2012-10-09: 3 [IU] via SUBCUTANEOUS
  Administered 2012-10-09 – 2012-10-10 (×3): 2 [IU] via SUBCUTANEOUS
  Administered 2012-10-10: 3 [IU] via SUBCUTANEOUS
  Administered 2012-10-10: 2 [IU] via SUBCUTANEOUS
  Administered 2012-10-10: 3 [IU] via SUBCUTANEOUS
  Administered 2012-10-10: 2 [IU] via SUBCUTANEOUS
  Administered 2012-10-11 (×2): 5 [IU] via SUBCUTANEOUS
  Administered 2012-10-11: 3 [IU] via SUBCUTANEOUS
  Administered 2012-10-11: 7 [IU] via SUBCUTANEOUS
  Administered 2012-10-11 (×2): 5 [IU] via SUBCUTANEOUS
  Administered 2012-10-12: 3 [IU] via SUBCUTANEOUS
  Administered 2012-10-12: 5 [IU] via SUBCUTANEOUS
  Administered 2012-10-12 (×2): 3 [IU] via SUBCUTANEOUS
  Administered 2012-10-12: 5 [IU] via SUBCUTANEOUS
  Administered 2012-10-12: 3 [IU] via SUBCUTANEOUS
  Administered 2012-10-13: 5 [IU] via SUBCUTANEOUS
  Administered 2012-10-13: 7 [IU] via SUBCUTANEOUS
  Administered 2012-10-13: 3 [IU] via SUBCUTANEOUS
  Administered 2012-10-13: 1 [IU] via SUBCUTANEOUS
  Administered 2012-10-13: 5 [IU] via SUBCUTANEOUS
  Administered 2012-10-14: 3 [IU] via SUBCUTANEOUS
  Administered 2012-10-14 – 2012-10-15 (×2): 1 [IU] via SUBCUTANEOUS
  Administered 2012-10-15: 2 [IU] via SUBCUTANEOUS
  Administered 2012-10-18: 3 [IU] via SUBCUTANEOUS
  Administered 2012-10-18 (×2): 1 [IU] via SUBCUTANEOUS
  Administered 2012-10-19: 2 [IU] via SUBCUTANEOUS

## 2012-10-08 NOTE — Progress Notes (Signed)
Speech Language Pathology Daily Session Note  Patient Details  Name: LORANZO DESHA MRN: [id number] Date of Birth: [birth year]  Today's Date: 10/08/2012 Time: 8295-6213 Time Calculation (min): 45 min  Short Term Goals: Week 1: SLP Short Term Goal 1 (Week 1): Pt will demonstrate improved management of secretions with mod assist to expectorate/use oral suctioning. SLP Short Term Goal 1 - Progress (Week 1): Progressing toward goal SLP Short Term Goal 2 (Week 1): Pt will demonstrate use of laryngeal closure techniques (eg. supraglottic swallow, glottal closure) in an effort to assist with airway protection during functional swallowing with max assist.  SLP Short Term Goal 2 - Progress (Week 1): Not progressing SLP Short Term Goal 3 (Week 1): Pt will sustain attention to a functional task for ~5 minutes with Max A verbal cues.  SLP Short Term Goal 3 - Progress (Week 1): Progressing toward goal SLP Short Term Goal 4 (Week 1): Pt will demonstrate functional problem solving for functional and familiar tasks with Max A verbal and question cues.  SLP Short Term Goal 4 - Progress (Week 1): Progressing toward goal  Skilled Therapeutic Interventions: Pt. seen for cognitive and dysphagia therapy. Following oral care, pt. administered single ice chips with total assist. Significantly delayed swallow initiation exhibited with decreased laryngeal elevation and coordination. Pt demonstrated throat clears indicative of likely decreased airway protection with 50% of trials. Pt required Mod verbal cues to sustain attention to trials and decrease verbosity. Pt was oriented X 4 throughout session but perseverative on buttock pain. Pt repositioned and RN made aware.    FIM:  Comprehension Comprehension Mode: Auditory Comprehension: 2-Understands basic 25 - 49% of the time/requires cueing 51 - 75% of the time Expression Expression Mode: Verbal Expression: 3-Expresses basic 50 - 74% of the time/requires cueing 25 -  50% of the time. Needs to repeat parts of sentences. Social Interaction Social Interaction: 2-Interacts appropriately 25 - 49% of time - Needs frequent redirection. Problem Solving Problem Solving: 2-Solves basic 25 - 49% of the time - needs direction more than half the time to initiate, plan or complete simple activities Memory Memory: 2-Recognizes or recalls 25 - 49% of the time/requires cueing 51 - 75% of the time FIM - Eating Eating Activity: 1: Helper performs IV, parenteral, or tube feeding  Pain Intermittent buttock pain, pt repositioned and RN notified.   Therapy/Group: Individual Therapy  Roshana Shuffield 10/08/2012, 3:17 PM

## 2012-10-08 NOTE — Progress Notes (Signed)
Occupational Therapy Session Notes  Patient Details  Name: [name] MRN: [id number] Date of Birth: [birth year]  Today's Date: 10/08/2012  Short Term Goals: Week 1:  OT Short Term Goal 1 (Week 1): Pt will sit EOB to don shirt with steady A OT Short Term Goal 2 (Week 1): Pt will don shirt with mod A OT Short Term Goal 3 (Week 1): Pt will transfer to toilet / BSC with mod A OT Short Term Goal 4 (Week 1): Pt and therapist will explore options for donning LB clothing with AE/ apaptive methods OT Short Term Goal 5 (Week 1): Pt will be able to pull down pants for toileting with steady A  Skilled Therapeutic Interventions/Progress Updates:   Session #1 4098-1191 - 55 Minutes Individual Therapy No complaints of pain, but complaints of fatigue Patient found supine in bed. Treatment focus on bed mobility to change brief, bed mobility to sit edge of bed, UB/LB bathing & dressing sitting edge of bed (with total assist, patient not willing to try using bilateral UEs; he states "I can't do it".), sit->stand from bed to pull up pants, stand pivot transfer from bed -> recliner, and positioning in recliner. Left patient seated in recliner with BUEs & BLE elevated and call bell within reach.   Session #2 [id number] - 10 Minutes Individual Therapy No complaints of pain Upon entering room patient found supine in bed asleep. Therapist woke patient and patient refused to get OOB. Therapist then attempted to work on [employer] with bilateral hands, however patient unable to stay awake long enough to accomplish any functional exercise. Left patient supine in bed asleep with call bell within reach.   Precautions:  Precautions Precautions: Fall Cervical Brace: Hard collar Restrictions Weight Bearing Restrictions: No  See FIM for current functional status  Jia Dottavio 10/08/2012, 10:27 AM
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
## 2012-10-08 NOTE — Progress Notes (Signed)
Physical Therapy Session Note  Patient Details  Name: [name] MRN: [id number] Date of Birth: 11-Jun-1931  Today's Date: 10/08/2012 Time: 4098-1191 Time Calculation (min): 57 min   Short Term Goals: Week 1:  PT Short Term Goal 1 (Week 1): Pt will transfer sit to stand to sit 3/4 trials for improved transfers and activity tolerance PT Short Term Goal 2 (Week 1): Pt will gait 20 ft total A x 2 pt =50% controlled environement PT Short Term Goal 3 (Week 1): Pt will perform bed mobility supine to sit min A in preparation for OOB transfer PT Short Term Goal 4 (Week 1): Pt will propel w/c with LE's 50 ft controlled environment min A for mobility, activity tolerance and LE strengthening  Skilled Therapeutic Interventions/Progress Updates:   Pt in recliner requesting ice and water - reminded pt that he is NPO at this time and why. Pt complaining of his bottom hurting and encouraged pt to get up out of chair and work with therapy. Initially agreeable but then declining throughout session to actively participate. Max a stand step transfer from recliner to w/c, encouraged w/c propulsion but refusing stating "I can't", finally walked LEs while therapist pushing chair to simulate some active w/c propulsion to/from gym. In parallel bars, completed 1 sit to stand with max A and refused to attempt gaiting stating "i can't." Discussed with pt importance of participation in therapy and that becoming stronger and more mobile will decreased pressure on bottom in bed/chair because he will be getting up and moving more often. Verbalized understanding and stated he would try again tomorrow but requested to return back to the room and in bed to rest. Returned back to bed with stand step technique and returned to supine position with HOB elevated. Pt falling asleep almost immediately after repositioned in the bed.   Due to pt's limited activity tolerance, verbal order received during patient care conference per MD to  decrease therapy schedule to 15 hours/7 days.  Therapy Documentation Precautions:  Precautions Precautions: Fall Cervical Brace: Hard collar Restrictions Weight Bearing Restrictions: No   Vital Signs: Oxygen Therapy O2 Device: Nasal cannula O2 Flow Rate (L/min): 3 L/min Pain: C/o pain all over and in bottom from sitting in recliner. Transferred and repositioned as able throughout session.  See FIM for current functional status  Therapy/Group: Individual Therapy  Karolee Stamps [facility] 10/08/2012, 2:23 PM

## 2012-10-08 NOTE — Progress Notes (Signed)
Subjective/Complaints: Some coughing. Nose sore from [place]  A 12 point review of systems has been performed and if not noted above is otherwise negative.  Objective: Vital Signs: Blood pressure 123/40, pulse 79, temperature 98.8 F (37.1 C), temperature source Rectal, resp. rate 19, height 5' 8.9" (1.75 m), weight 56.8 kg (125 lb 3.5 oz), SpO2 95.00%. Dg Chest 2 View  10/07/2012  *RADIOLOGY REPORT*  Clinical Data: Shortness of breath, weakness, cough and leukocytosis.  CHEST - 2 VIEW  Comparison: Chest x-ray 09/28/2012.  Findings: Compared to the prior examination there is worsening multifocal interstitial and airspace opacity throughout the right lower lobe, concerning for pneumonia.  Left lung is clear. Probable trace right pleural effusion. Lungs appear mildly hyperexpanded with some increased retrosternal air space and pruning of the pulmonary vasculature in the periphery, compatible with underlying COPD.  Mediastinal contours are unremarkable. Atherosclerosis in the thoracic aorta.  IMPRESSION: 1.  Interval development of patchy interstitial and airspace opacities throughout the right lower lobe highly concerning for pneumonia. 2.  Probable trace right pleural effusion. 3.  Chronic changes of COPD redemonstrated. 4.  Atherosclerosis.  These results will be called to the ordering clinician or representative by the Radiologist Assistant, and communication documented in the PACS Dashboard.   Original Report Authenticated By: Florencia Reasons, M.D.     Basename 10/06/12 0545  WBC 11.1*  HGB 12.7*  HCT 37.3*  PLT 169    Basename 10/06/12 0545  NA 143  K 3.5  CL 105  CO2 28  GLUCOSE 163*  BUN 24*  CREATININE 0.66  CALCIUM 9.1   CBG (last 3)   Basename 10/08/12 0358 10/08/12 0014 10/07/12 2035  GLUCAP 221* 214* 156*    Wt Readings from Last 3 Encounters:  10/08/12 56.8 kg (125 lb 3.5 oz)  10/02/12 56.4 kg (124 lb 5.4 oz)  10/02/12 56.4 kg (124 lb 5.4 oz)    Physical Exam:    Constitutional:  Patient with poor dental dentition  HENT:  Head: Normocephalic.  edentulous  Eyes:  Pupils round and reactive to light  Neck:  Cervical collar intact  Cardiovascular: Normal rate and regular rhythm.  Pulmonary/Chest: Effort normal and breath sounds normal although effort is limited. He has no wheezes. Occasional cough  Abdominal: Soft. Bowel sounds are normal. He exhibits no distension. There is no tenderness. PEG tube site is clean and dry  Musculoskeletal: He exhibits no edema.  Neurological: He is alert.  Reasonable insight and awareness. Was joking with me on occasion. Patient made good eye contact with examiner. LUE is grossly 1 to 2+/5. RUE is 1- 2+/5. LE's are grossly 3+ to 4/5. Sensation slightly diminished left greater than right upper extremities. Speech dysarthric but intelligible  Skin:     Assessment/Plan: 1. Functional deficits secondary to C3-4 fx with central cord injury which require 3+ hours per day of interdisciplinary therapy in a comprehensive inpatient rehab setting. Physiatrist is providing close team supervision and 24 hour management of active medical problems listed below. Physiatrist and rehab team continue to assess barriers to discharge/monitor patient progress toward functional and medical goals. FIM: FIM - Bathing Bathing: 1: Total-Patient completes 0-2 of 10 parts or less than 25%  FIM - Upper Body Dressing/Undressing Upper body dressing/undressing: 0: Activity did not occur (wore gown; dtr stated she will bring clothes tomorrow) FIM - Lower Body Dressing/Undressing Lower body dressing/undressing: 0: Wears gown/pajamas-no public clothing  FIM - Toileting Toileting: 1: Total-Patient completed zero steps, helper did all 3  FIM - Diplomatic Services operational officer Devices: Bedside commode Toilet Transfers: 3-To toilet/BSC: Mod A (lift or lower assist);3-From toilet/BSC: Mod A (lift or lower assist)  FIM - Bed/Chair  Transfer Bed/Chair Transfer Assistive Devices: HOB elevated Bed/Chair Transfer: 1: Supine > Sit: Total A (helper does all/Pt. < 25%);1: Sit > Supine: Total A (helper does all/Pt. < 25%)  FIM - Locomotion: Wheelchair Locomotion: Wheelchair: 0: Activity did not occur FIM - Locomotion: Ambulation Locomotion: Ambulation: 0: Activity did not occur (pt refused OOB)  Comprehension Comprehension Mode: Auditory Comprehension: 2-Understands basic 25 - 49% of the time/requires cueing 51 - 75% of the time  Expression Expression Mode: Verbal Expression: 3-Expresses basic 50 - 74% of the time/requires cueing 25 - 50% of the time. Needs to repeat parts of sentences.  Social Interaction Social Interaction: 2-Interacts appropriately 25 - 49% of time - Needs frequent redirection.  Problem Solving Problem Solving: 2-Solves basic 25 - 49% of the time - needs direction more than half the time to initiate, plan or complete simple activities  Memory Memory: 1-Recognizes or recalls less than 25% of the time/requires cueing greater than 75% of the time  Medical Problem List and Plan:  1. 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 DVT Prophylaxis/Anticoagulation: SCDs. Monitor for any signs of DVT  3. Pain Management: Tylenol and Flexeril for spasms as needed. Will change to Robaxin and monitor  4. Mood/delirium/acute encephalopathy.Patient didn't sleep much of weekend and required 1:1 for safety.    -admittedly drank "2-3 beers at night" PTA.  -?old CVA on head CT. I suspect he suffered a head trauma here as well. 5. Neuropsych: This patient is not capable of making decisions on his/her own behalf.  6. Dysphagia. Status post gastrostomy tube placement 10/01/2012 per Dr. Lindie Spruce.   7. Aspiration pneumonia/ respiratory failure. Intravenous Unasyn completed. Continue bronchodilators as advised and followup chest x-ray as needed.  8. Hypothyroidism. Synthroid  9. Diabetes  mellitus. Check blood sugars every 4 hours while on tube feeds. lantus insulin 12 u qday--may need further titration.  Sugars are acceptable at present. Patient was on tradgenta 5 mg daily prior to admission 10. Leukocytosis: aspiration pneumonia  -vanc and zosyn  -appreciate CCM follow up  -adjust TF  -IS, OOB  LOS (Days) 5 A FACE TO FACE EVALUATION WAS PERFORMED  SWARTZ,ZACHARY T 10/08/2012, 7:48 AM

## 2012-10-09 ENCOUNTER — Inpatient Hospital Stay (HOSPITAL_COMMUNITY): Payer: PRIVATE HEALTH INSURANCE | Admitting: Speech Pathology

## 2012-10-09 ENCOUNTER — Inpatient Hospital Stay (HOSPITAL_COMMUNITY): Payer: PRIVATE HEALTH INSURANCE | Admitting: Occupational Therapy

## 2012-10-09 ENCOUNTER — Inpatient Hospital Stay (HOSPITAL_COMMUNITY): Payer: PRIVATE HEALTH INSURANCE

## 2012-10-09 LAB — BASIC METABOLIC PANEL
BUN: 36 mg/dL — ABNORMAL HIGH (ref 6–23)
GFR calc Af Amer: >90 mL/min (ref >90)
GFR calc non Af Amer: 85 mL/min — ABNORMAL LOW (ref >90)
Potassium: 4.8 mEq/L (ref 3.5–5.1)
Sodium: 143 mEq/L (ref 135–145)

## 2012-10-09 LAB — GLUCOSE, CAPILLARY
Glucose-Capillary: 180 mg/dL — ABNORMAL HIGH (ref 70–99)
Glucose-Capillary: 222 mg/dL — ABNORMAL HIGH (ref 70–99)

## 2012-10-09 LAB — CBC
MCHC: 33.1 g/dL (ref 30.0–36.0)
RDW: 16.2 % — ABNORMAL HIGH (ref 11.5–15.5)

## 2012-10-09 LAB — PROCALCITONIN: Procalcitonin: 3.3 ng/mL

## 2012-10-09 MED ORDER — OSMOLITE 1.5 CAL PO LIQD
1000.0000 mL | ORAL | Status: DC
  Administered 2012-10-09 – 2012-10-12 (×4): 1000 mL
  Filled 2012-10-09 (×10): qty 1000

## 2012-10-09 NOTE — Progress Notes (Signed)
Subjective/Complaints: "feels like something is pushing down on my shoulders".-he is slouched in bed  A 12 point review of systems has been performed and if not noted above is otherwise negative.  Objective: Vital Signs: Blood pressure 119/49, pulse 72, temperature 98 F (36.7 C), temperature source Oral, resp. rate 17, height 5' 8.9" (1.75 m), weight 54.4 kg (119 lb 14.9 oz), SpO2 90.00%. Dg Chest 2 View  10/07/2012  *RADIOLOGY REPORT*  Clinical Data: Shortness of breath, weakness, cough and leukocytosis.  CHEST - 2 VIEW  Comparison: Chest x-ray 09/28/2012.  Findings: Compared to the prior examination there is worsening multifocal interstitial and airspace opacity throughout the right lower lobe, concerning for pneumonia.  Left lung is clear. Probable trace right pleural effusion. Lungs appear mildly hyperexpanded with some increased retrosternal air space and pruning of the pulmonary vasculature in the periphery, compatible with underlying COPD.  Mediastinal contours are unremarkable. Atherosclerosis in the thoracic aorta.  IMPRESSION: 1.  Interval development of patchy interstitial and airspace opacities throughout the right lower lobe highly concerning for pneumonia. 2.  Probable trace right pleural effusion. 3.  Chronic changes of COPD redemonstrated. 4.  Atherosclerosis.  These results will be called to the ordering clinician or representative by the Radiologist Assistant, and communication documented in the PACS Dashboard.   Original Report Authenticated By: Florencia Reasons, M.D.    No results found for this basename: WBC:2,HGB:2,HCT:2,PLT:2 in the last 72 hours No results found for this basename: NA:2,K:2,CL:2,CO2:2,GLUCOSE:2,BUN:2,CREATININE:2,CALCIUM:2 in the last 72 hours CBG (last 3)   Basename 10/09/12 0416 10/09/12 10/08/12 1956  GLUCAP 183* 187* 184*    Wt Readings from Last 3 Encounters:  10/09/12 54.4 kg (119 lb 14.9 oz)  10/02/12 56.4 kg (124 lb 5.4 oz)  10/02/12 56.4 kg  (124 lb 5.4 oz)    Physical Exam:  Constitutional:  Patient with poor dental dentition  HENT:  Head: Normocephalic.  edentulous  Eyes:  Pupils round and reactive to light  Neck:  Cervical collar intact  Cardiovascular: Normal rate and regular rhythm.  Pulmonary/Chest: Effort normal and breath sounds normal although effort is limited. He has no wheezes. Occasional cough  Abdominal: Soft. Bowel sounds are normal. He exhibits no distension. There is no tenderness. PEG tube site is clean and dry  Musculoskeletal: He exhibits no edema.  Neurological: He is alert.  Reasonable insight and awareness. Was joking with me on occasion. Intermittent confusion today Patient made good eye contact with examiner. LUE is grossly 1 to 2+/5. RUE is 1- 2+/5. LE's are grossly 3+ to 4/5. Sensation slightly diminished left greater than right upper extremities. Speech dysarthric but intelligible  Skin:     Assessment/Plan: 1. Functional deficits secondary to C3-4 fx with central cord injury which require 3+ hours per day of interdisciplinary therapy in a comprehensive inpatient rehab setting. Physiatrist is providing close team supervision and 24 hour management of active medical problems listed below. Physiatrist and rehab team continue to assess barriers to discharge/monitor patient progress toward functional and medical goals. FIM: FIM - Bathing Bathing: 1: Total-Patient completes 0-2 of 10 parts or less than 25%  FIM - Upper Body Dressing/Undressing Upper body dressing/undressing: 1: Total-Patient completed less than 25% of tasks FIM - Lower Body Dressing/Undressing Lower body dressing/undressing: 1: Total-Patient completed less than 25% of tasks  FIM - Toileting Toileting: 0: Activity did not occur  FIM - Diplomatic Services operational officer Devices: Psychiatrist Transfers: 0-Activity did not occur  FIM - Games developer  Transfer Assistive Devices: HOB  elevated Bed/Chair Transfer: 2: Chair or W/C > Bed: Max A (lift and lower assist);4: Sit > Supine: Min A (steadying pt. > 75%/lift 1 leg)  FIM - Locomotion: Wheelchair Locomotion: Wheelchair: 1: Total Assistance/staff pushes wheelchair (Pt<25%) FIM - Locomotion: Ambulation Locomotion: Ambulation: 0: Activity did not occur (refused attempt)  Comprehension Comprehension Mode: Auditory Comprehension: 2-Understands basic 25 - 49% of the time/requires cueing 51 - 75% of the time  Expression Expression Mode: Verbal Expression: 3-Expresses basic 50 - 74% of the time/requires cueing 25 - 50% of the time. Needs to repeat parts of sentences.  Social Interaction Social Interaction: 2-Interacts appropriately 25 - 49% of time - Needs frequent redirection.  Problem Solving Problem Solving: 2-Solves basic 25 - 49% of the time - needs direction more than half the time to initiate, plan or complete simple activities  Memory Memory: 2-Recognizes or recalls 25 - 49% of the time/requires cueing 51 - 75% of the time  Medical Problem List and Plan:  1. 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 DVT Prophylaxis/Anticoagulation: SCDs. Monitor for any signs of DVT  3. Pain Management: Tylenol and Flexeril for spasms as needed. Will change to Robaxin and monitor  4. Mood/delirium/acute encephalopathy.Patient didn't sleep much of weekend and required 1:1 for safety.    -admittedly drank "2-3 beers at night" PTA.  -?old CVA on head CT. I suspect he suffered a head trauma here as well. 5. Neuropsych: This patient is not capable of making decisions on his/her own behalf.  6. Dysphagia. Status post gastrostomy tube placement 10/01/2012 per Dr. Lindie Spruce.   7. Aspiration pneumonia/ respiratory failure. Intravenous Unasyn completed. Continue bronchodilators as advised and followup chest x-ray as needed.  8. Hypothyroidism. Synthroid  9. Diabetes mellitus. Check blood sugars every  4 hours while on tube feeds. lantus insulin 12 u qday--may need further titration.  Sugars are acceptable at present. Patient was on tradgenta 5 mg daily prior to admission 10. Leukocytosis: aspiration pneumonia  -vanc and zosyn  -appreciate CCM follow up  -adjust TF  -IS, OOB  -afebrile, sats are stable  -f/u labs, cxr  LOS (Days) 6 A FACE TO FACE EVALUATION WAS PERFORMED  SWARTZ,ZACHARY T 10/09/2012, 6:51 AM

## 2012-10-09 NOTE — Progress Notes (Signed)
Speech Language Pathology Daily Session Notes  Patient Details  Name: [name] MRN: [id number] Date of Birth: Jul 23, 1931  Today's Date: 10/09/2012  Session 1 Time: 1000-1030 Time Calculation (min): 30 min  Session 2 Time: Time Calculation:   Short Term Goals: Week 1: SLP Short Term Goal 1 (Week 1): Pt will demonstrate improved management of secretions with mod assist to expectorate/use oral suctioning. SLP Short Term Goal 1 - Progress (Week 1): Progressing toward goal SLP Short Term Goal 2 (Week 1): Pt will demonstrate use of laryngeal closure techniques (eg. supraglottic swallow, glottal closure) in an effort to assist with airway protection during functional swallowing with max assist.  SLP Short Term Goal 2 - Progress (Week 1): Not progressing SLP Short Term Goal 3 (Week 1): Pt will sustain attention to a functional task for ~5 minutes with Max A verbal cues.  SLP Short Term Goal 3 - Progress (Week 1): Progressing toward goal SLP Short Term Goal 4 (Week 1): Pt will demonstrate functional problem solving for functional and familiar tasks with Max A verbal and question cues.  SLP Short Term Goal 4 - Progress (Week 1): Progressing toward goal  Skilled Therapeutic Interventions:  Session 1: Treatment focus on dysphagia and cognitive goals. Following oral care, pt. administered single ice chips with total assist. Significantly delayed swallow initiation exhibited with decreased laryngeal elevation and coordination. Pt demonstrated throat clears indicative of likely decreased airway protection with 90% of trials. Pt required Min verbal cues to sustain attention to trials and decrease verbosity. Pt was oriented X 4 throughout session and demonstrated increased working memory of functional information with Mod A question and semantic cues.    Session 2: Treatment focus on dysphagia goals. Pt had repeat chest X-ray today which showed a slight worsening of RLL PNA. Spoke with PA who  advised to place him on strict NPO precautions and not to administer any further trials of ice chips.  Pt administered oral care with treatment focus on hyolaryngeal elevation exercises. Pt required total A mutlimodal cueing for execution of exercises.   FIM:  Comprehension Comprehension Mode: Auditory Comprehension: 2-Understands basic 25 - 49% of the time/requires cueing 51 - 75% of the time Expression Expression Mode: Verbal Expression: 4-Expresses basic 75 - 89% of the time/requires cueing 10 - 24% of the time. Needs helper to occlude trach/needs to repeat words. Social Interaction Social Interaction: 3-Interacts appropriately 50 - 74% of the time - May be physically or verbally inappropriate. Problem Solving Problem Solving: 2-Solves basic 25 - 49% of the time - needs direction more than half the time to initiate, plan or complete simple activities Memory Memory: 1-Recognizes or recalls less than 25% of the time/requires cueing greater than 75% of the time  Pain Reported buttock pain intermittently, pt repositioned and RN made aware.   Therapy/Group: Individual Therapy  Cortny Bambach 10/09/2012, 11:06 AM

## 2012-10-09 NOTE — Progress Notes (Signed)
Occupational Therapy Session Note  Patient Details  Name: [name] MRN: [id number] Date of Birth: 02-08-1931  Today's Date: 10/09/2012 Time: 1100-1200 Time Calculation (min): 60 min  Short Term Goals: Week 1:  OT Short Term Goal 1 (Week 1): Pt will sit EOB to don shirt with steady A OT Short Term Goal 2 (Week 1): Pt will don shirt with mod A OT Short Term Goal 3 (Week 1): Pt will transfer to toilet / BSC with mod A OT Short Term Goal 4 (Week 1): Pt and therapist will explore options for donning LB clothing with AE/ apaptive methods OT Short Term Goal 5 (Week 1): Pt will be able to pull down pants for toileting with steady A  Skilled Therapeutic Interventions/Progress Updates:  Patient found supine in bed with complaints of bottom being sore. Treatment focus on AROM throughout bilateral hands (using sensory integration aspect to encourage patient to actively move bilateral hands/fingers), bed mobility, dynamic sitting balance/tolerance/endurance edge of bed, sit-> stand for stand pivot transfer into recliner, and overall activity tolerance/endurance. Patient refused bathing or dressing during session and initially refused getting OOB. With moderate encouragement and placing donut cushion in recliner, patient willing to sit in recliner. PEG feeding tube leaking during session, notified RN. At end of session left patient seated in recliner with call bell within reach and positioned with pillows.   Precautions:  Precautions Precautions: Fall Cervical Brace: Hard collar Restrictions Weight Bearing Restrictions: No  See FIM for current functional status  Therapy/Group: Individual Therapy  Jossette Zirbel 10/09/2012, 12:03 PM

## 2012-10-09 NOTE — Progress Notes (Signed)
Nutrition Follow-up  Intervention:    Since weight continues to trend down, will increase Osmolite 1.5 TF to 65 ml/h x 20 hours with Prostat 30 ml once daily to increase intake to 2050 kcals, 97 grams protein, 988 ml free water daily.  Continue 200 ml free water QID.  Assessment:   Patient continues to lose weight.  BMI is down to 17.8.  Tolerating TF well per RN.  Diet Order:  NPO  TF: Osmolite 1.5 at 60 ml/hr x 20 hours with 30 ml Prostat per tube daily providing 1900 kcal, 91 grams protein and 914 ml free water.   Free water: 200 ml QID  Total water provision is 1714 ml daily.  Meds: Scheduled Meds:    . albuterol  2.5 mg Nebulization Q6H  . antiseptic oral rinse  15 mL Mouth Rinse QID  . aspirin  81 mg Per Tube Daily  . budesonide  0.5 mg Nebulization BID  . feeding supplement  30 mL Per Tube Daily  . folic acid  1 mg Per Tube Daily  . free water  200 mL Per Tube Q6H  . insulin aspart  0-5 Units Subcutaneous QHS  . insulin aspart  0-9 Units Subcutaneous Q4H  . insulin glargine  10 Units Subcutaneous BID  . ipratropium  0.5 mg Nebulization Q6H  . levothyroxine  88 mcg Per Tube QAC breakfast  . piperacillin-tazobactam (ZOSYN)  IV  3.375 g Intravenous Q8H  . QUEtiapine  50 mg Oral QHS  . thiamine  100 mg Per Tube Daily  . vancomycin  1,250 mg Intravenous Q24H  . DISCONTD: insulin aspart  2-6 Units Subcutaneous Q4H  . DISCONTD: insulin glargine  12 Units Subcutaneous Q breakfast   Continuous Infusions:    . feeding supplement (OSMOLITE 1.5 CAL) 1,000 mL (10/08/12 1856)   PRN Meds:.acetaminophen, acetaminophen, albuterol, bisacodyl, LORazepam, methocarbamol, ondansetron (ZOFRAN) IV, ondansetron, polyethylene glycol, sodium chloride, sorbitol, zolpidem  Labs:  CMP     Component Value Date/Time   NA 143 10/09/2012 0650   K 4.8 10/09/2012 0650   CL 105 10/09/2012 0650   CO2 26 10/09/2012 0650   GLUCOSE 224* 10/09/2012 0650   BUN 36* 10/09/2012 0650   CREATININE  0.76 10/09/2012 0650   CALCIUM 8.7 10/09/2012 0650   PROT 6.4 10/06/2012 0545   ALBUMIN 2.0* 10/06/2012 0545   AST 97* 10/06/2012 0545   ALT 50 10/06/2012 0545   ALKPHOS 159* 10/06/2012 0545   BILITOT 0.7 10/06/2012 0545   GFRNONAA 85* 10/09/2012 0650   GFRAA >90 10/09/2012 0650   CBG (last 3)   Basename 10/09/12 1132 10/09/12 0731 10/09/12 0416  GLUCAP 184* 180* 183*     Intake/Output Summary (Last 24 hours) at 10/09/12 1314 Last data filed at 10/09/12 1137  Gross per 24 hour  Intake      0 ml  Output   1900 ml  Net  -1900 ml    Weight Status:  54.4 kg (10/10) down from 56.4 kg (10/3)  BMI is 17.8 - underweight  Re-estimated needs:  1900 - 2100 kcal, 85-100 grams protein  Nutrition Dx:  Swallowing difficulty r/t cervical fracture AEB PEG tube required for nutrition. Ongoing.  Goal:  Meet 90-100% estimated nutrition needs with EN - met.  Monitor:  TF tolerance, weight trend, labs  Joaquin Courts, RD, LDN, CNSC Pager# [phone] After Hours Pager# [phone]
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
## 2012-10-09 NOTE — Progress Notes (Signed)
Physical Therapy Session Note  Patient Details  Name: [name] MRN: [id number] Date of Birth: 1931/10/21  Today's Date: 10/09/2012 Time: 1400-1447 Time Calculation (min): 47 min  Short Term Goals: Week 1:  PT Short Term Goal 1 (Week 1): Pt will transfer sit to stand to sit 3/4 trials for improved transfers and activity tolerance PT Short Term Goal 2 (Week 1): Pt will gait 20 ft total A x 2 pt =50% controlled environement PT Short Term Goal 3 (Week 1): Pt will perform bed mobility supine to sit min A in preparation for OOB transfer PT Short Term Goal 4 (Week 1): Pt will propel w/c with LE's 50 ft controlled environment min A for mobility, activity tolerance and LE strengthening  Skilled Therapeutic Interventions/Progress Updates:    Upon entering the room pt asked if we were going to [place] or if I was going to stick him with a needed. Reoriented pt that I am a physical therapist and goal for our session to get OOB and move. Pt agreeable to try. Focused on basic transfers (stand pivot/step) with sit to stands as able and extra time needed (heavy min to light mod A). In parallel bars able to gait x 5' forward and the backwards 5' with min A, max encouragement. Pt refused to continue therapy in gym and requested to return back to room. Gait from w/c to bed x 5' with HHA/mod A, posterior lean when LOB backing up and returned to supine with S. Positioned in sidelying for pressure relief.   Therapy Documentation Precautions:  Precautions Precautions: Fall Cervical Brace: Hard collar Restrictions Weight Bearing Restrictions: No General: Amount of Missed PT Time (min): 13 Minutes Missed Time Reason: Patient unwilling/refused to participate without medical reason;Patient fatigue   Pain:  c/o pain on bottom. Repositioned throughout and encouraged mobility.   Locomotion : Ambulation Ambulation/Gait Assistance: 3: Mod assist   See FIM for current functional status  Therapy/Group:  Individual Therapy  Karolee Stamps [facility] 10/09/2012, 3:00 PM

## 2012-10-09 NOTE — Progress Notes (Signed)
Pt alert and orientated x 4. Calls for care by yelling out, often, but uses call light at time, and asks for nurse by name. Strong, productive cough, uses [place] to self suction.  At other times requires frequently reminders to suction. LUE strong, RLE weak. LUQ with peg. C/o of some tenderness around peg site. Dressing changed to peg area. Visible old dry blood on dressiing.  [place] running at 75ml/hr. Order to check gastric residual q 4hr. 5ml of gastric residual when last checked.  Tylenol x 1 given for generalized discomfort. Requiring turn q 2hrs, however,  pt tends to drift to R side of bed. Cervical collar on at all times. Receiving ordered antibiotic. Participate in therapy this shift.

## 2012-10-09 NOTE — Progress Notes (Signed)
Social Work Patient ID: [name], male   DOB: 1931-09-26, 78 y.o.   MRN: [id number]  Met with patient and have spoken with roommate to review team conference.  Both understand that date is targeted for 10/25, however, will need to have time for extensive education with [name] prior to d/c. Have also left messages with daughter, Marcelino Duster, asking that she contact me to discuss team conf info and further confirm d/c plans for pt's care.  Jadis Mika

## 2012-10-09 NOTE — Plan of Care (Signed)
Problem: RH PAIN MANAGEMENT Goal: RH STG PAIN MANAGED AT OR BELOW PT'S PAIN GOAL <3  Outcome: Progressing Requires Tylenol 650 mg q prn

## 2012-10-10 ENCOUNTER — Encounter (HOSPITAL_COMMUNITY): Payer: PRIVATE HEALTH INSURANCE | Admitting: Occupational Therapy

## 2012-10-10 ENCOUNTER — Inpatient Hospital Stay (HOSPITAL_COMMUNITY): Payer: PRIVATE HEALTH INSURANCE

## 2012-10-10 ENCOUNTER — Inpatient Hospital Stay (HOSPITAL_COMMUNITY): Payer: PRIVATE HEALTH INSURANCE | Admitting: Speech Pathology

## 2012-10-10 ENCOUNTER — Inpatient Hospital Stay (HOSPITAL_COMMUNITY): Payer: PRIVATE HEALTH INSURANCE | Admitting: Occupational Therapy

## 2012-10-10 DIAGNOSIS — J690 Pneumonitis due to inhalation of food and vomit: Secondary | ICD-10-CM

## 2012-10-10 DIAGNOSIS — S069X9A Unspecified intracranial injury with loss of consciousness of unspecified duration, initial encounter: Secondary | ICD-10-CM

## 2012-10-10 DIAGNOSIS — Z5189 Encounter for other specified aftercare: Secondary | ICD-10-CM

## 2012-10-10 DIAGNOSIS — R1310 Dysphagia, unspecified: Secondary | ICD-10-CM

## 2012-10-10 DIAGNOSIS — IMO0002 Reserved for concepts with insufficient information to code with codable children: Secondary | ICD-10-CM

## 2012-10-10 DIAGNOSIS — J95821 Acute postprocedural respiratory failure: Secondary | ICD-10-CM

## 2012-10-10 LAB — GLUCOSE, CAPILLARY
Glucose-Capillary: 200 mg/dL — ABNORMAL HIGH (ref 70–99)
Glucose-Capillary: 210 mg/dL — ABNORMAL HIGH (ref 70–99)
Glucose-Capillary: 223 mg/dL — ABNORMAL HIGH (ref 70–99)

## 2012-10-10 MED ORDER — SODIUM CHLORIDE 0.9 % IV SOLN
1.0000 g | Freq: Three times a day (TID) | INTRAVENOUS | Status: DC
  Administered 2012-10-10 – 2012-10-13 (×10): 1 g via INTRAVENOUS
  Filled 2012-10-10 (×12): qty 1

## 2012-10-10 MED ORDER — ALBUTEROL SULFATE (5 MG/ML) 0.5% IN NEBU
2.5000 mg | INHALATION_SOLUTION | RESPIRATORY_TRACT | Status: DC
  Administered 2012-10-10 – 2012-10-12 (×13): 2.5 mg via RESPIRATORY_TRACT
  Filled 2012-10-10 (×13): qty 0.5

## 2012-10-10 MED ORDER — METHYLPREDNISOLONE SODIUM SUCC 40 MG IJ SOLR
40.0000 mg | Freq: Two times a day (BID) | INTRAMUSCULAR | Status: DC
  Administered 2012-10-10 – 2012-10-13 (×6): 40 mg via INTRAVENOUS
  Filled 2012-10-10 (×8): qty 1

## 2012-10-10 MED ORDER — FREE WATER
200.0000 mL | Freq: Every day | Status: DC
  Administered 2012-10-10 – 2012-10-16 (×22): 200 mL

## 2012-10-10 MED ORDER — VANCOMYCIN HCL 1000 MG IV SOLR
750.0000 mg | Freq: Two times a day (BID) | INTRAVENOUS | Status: DC
  Administered 2012-10-11 – 2012-10-13 (×5): 750 mg via INTRAVENOUS
  Filled 2012-10-10 (×7): qty 750

## 2012-10-10 MED ORDER — INSULIN GLARGINE 100 UNIT/ML ~~LOC~~ SOLN
12.0000 [IU] | Freq: Two times a day (BID) | SUBCUTANEOUS | Status: DC
  Administered 2012-10-10 – 2012-10-11 (×4): 12 [IU] via SUBCUTANEOUS

## 2012-10-10 NOTE — Progress Notes (Signed)
Speech Language Pathology Daily Session Note  Patient Details  Name: [name] MRN: [id number] Date of Birth: 06-Dec-1931  Today's Date: 10/10/2012 Time: 1430-1500 Time Calculation (min): 30 min  Short Term Goals: Week 1: SLP Short Term Goal 1 (Week 1): Pt will demonstrate improved management of secretions with mod assist to expectorate/use oral suctioning. SLP Short Term Goal 1 - Progress (Week 1): Progressing toward goal SLP Short Term Goal 2 (Week 1): Pt will demonstrate use of laryngeal closure techniques (eg. supraglottic swallow, glottal closure) in an effort to assist with airway protection during functional swallowing with max assist.  SLP Short Term Goal 2 - Progress (Week 1): Not progressing SLP Short Term Goal 3 (Week 1): Pt will sustain attention to a functional task for ~5 minutes with Max A verbal cues.  SLP Short Term Goal 3 - Progress (Week 1): Progressing toward goal SLP Short Term Goal 4 (Week 1): Pt will demonstrate functional problem solving for functional and familiar tasks with Max A verbal and question cues.  SLP Short Term Goal 4 - Progress (Week 1): Progressing toward goal  Skilled Therapeutic Interventions: Treatment focus on dysphagia goals. Pt awake in bed with family members present. Oral care performed and pt required Max A multimodal cueing for participation in hyolaryngeal elevation and glottic closure exercises and for appropriate execution of the exercises.  Pt refused last 15 minutes of session reporting, "I will be do better tomorrow."    FIM:  Comprehension Comprehension Mode: Auditory Comprehension: 2-Understands basic 25 - 49% of the time/requires cueing 51 - 75% of the time Expression Expression Mode: Verbal Expression: 3-Expresses basic 50 - 74% of the time/requires cueing 25 - 50% of the time. Needs to repeat parts of sentences. Social Interaction Social Interaction: 3-Interacts appropriately 50 - 74% of the time - May be physically or  verbally inappropriate. Problem Solving Problem Solving: 2-Solves basic 25 - 49% of the time - needs direction more than half the time to initiate, plan or complete simple activities Memory Memory: 3-Recognizes or recalls 50 - 74% of the time/requires cueing 25 - 49% of the time  Pain No/Denies Pain  Therapy/Group: Individual Therapy  [name] 10/10/2012, 3:23 PM
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
## 2012-10-10 NOTE — Progress Notes (Signed)
Physical Therapy Weekly Progress Note  Patient Details  Name: [name] MRN: [id number] Date of Birth: Oct 18, 1931  Today's Date: 10/10/2012 Time: 1191-4782 Time Calculation (min): 10 min Individual therapy Initially pt agreeable to participate showing some insight into situation stating he knows he can't go home like this. Then when time to initiate OOB, pt began to refuse stating he was not getting out of bed, maybe tomorrow. Therapist continued to encourage pt using going home as motivation, but pt continuing to adamantly refuse. Pt verbalized he understands he is only hurting himself in the long run by not participating and is willing to accept those consequences. Repositioned in the bed. Missed 50 minutes of skilled PT session.  Patient has met 0 of 4 short term goals.  Pt has demonstrated very limited participation in therapy sessions and has made minimal progress. When willing, pt is able to transfer with mod A and take a few steps with mod A (HHA). If pt willing to participate actively and work with therapy, feel that he could do fairly well functionally (min A level) but pt is the limiting factor at this time.   Patient continues to demonstrate the following deficits: quadriparesis, impaired cognition, decreased balance, decreased activity tolerance, decreased safety, and therefore will continue to benefit from skilled PT intervention to enhance overall performance with activity tolerance, balance, postural control, ability to compensate for deficits and functional use of  right upper extremity and left upper extremity.  Patient not progressing toward long term goals due to refusing to functionally participate in therapy. Revised some goals, but at this time  Continue plan of care.  Feel that if pt willing to participate, goals at this level are appropriate.  PT Short Term Goals Week 1:  PT Short Term Goal 1 (Week 1): Pt will transfer sit to stand to sit 3/4 trials for improved  transfers and activity tolerance PT Short Term Goal 1 - Progress (Week 1): Progressing toward goal PT Short Term Goal 2 (Week 1): Pt will gait 20 ft total A x 2 pt =50% controlled environement PT Short Term Goal 2 - Progress (Week 1): Partly met (mod A of 1 x 5') PT Short Term Goal 3 (Week 1): Pt will perform bed mobility supine to sit min A in preparation for OOB transfer PT Short Term Goal 3 - Progress (Week 1): Not met (max A) PT Short Term Goal 4 (Week 1): Pt will propel w/c with LE's 50 ft controlled environment min A for mobility, activity tolerance and LE strengthening PT Short Term Goal 4 - Progress (Week 1): Not met (max A still needed due to participation) Week 2:  PT Short Term Goal 1 (Week 2): Pt will tolerate OOB x 1 hr a day at least PT Short Term Goal 2 (Week 2): Pt will gait with mod A x 15' for functional mobility PT Short Term Goal 3 (Week 2): Pt will complete supine to sit with mod A for improved bed mobility PT Short Term Goal 4 (Week 2): Pt will transfer 3/4 trials with mod A s  Skilled Therapeutic Interventions/Progress Updates:  Ambulation/gait training;Balance/vestibular training;Cognitive remediation/compensation;Disease management/prevention;Discharge planning;DME/adaptive equipment instruction;Functional mobility training;Psychosocial support;Patient/family education;Pain management;Neuromuscular re-education;Skin care/wound management;Splinting/orthotics;Stair training;Therapeutic Activities;UE/LE Coordination activities;UE/LE Strength taining/ROM;Therapeutic Exercise;Wheelchair propulsion/positioning   Therapy Documentation Precautions:  Precautions Precautions: Fall Cervical Brace: Hard collar Restrictions Weight Bearing Restrictions: No General: Amount of Missed PT Time (min): 50 Minutes Missed Time Reason: Patient unwilling/refused to participate without medical reason   Pain:  Reports  pain all over. RN aware and medication to be given.    See FIM for  current functional status  Therapy/Group: Individual Therapy  Karolee Stamps [facility] 10/10/2012, 9:06 AM

## 2012-10-10 NOTE — Progress Notes (Signed)
Subjective/Complaints: "feels like something is pushing down on my shoulders".-he is slouched in bed  A 12 point review of systems has been performed and if not noted above is otherwise negative.  Objective: Vital Signs: Blood pressure 132/52, pulse 74, temperature 97.6 F (36.4 C), temperature source Oral, resp. rate 17, height 5' 8.9" (1.75 m), weight 58.7 kg (129 lb 6.6 oz), SpO2 96.00%. Dg Chest Port 1 View  10/09/2012  *RADIOLOGY REPORT*  Clinical Data: Airspace disease.  PORTABLE CHEST - 1 VIEW  Comparison: 10/07/2012.  Findings: Slight worsening of airspace disease in the right lower lobe.  The appearance is most compatible with right lower lobe pneumonia.  No parapneumonic effusion.  Cardiopericardial silhouette and mediastinal contours are within normal limits.  The left lung remains clear aside from basilar atelectasis.  IMPRESSION: Slight worsening aeration of the right lower lobe pneumonia.   Original Report Authenticated By: Andreas Newport, M.D.     Basename 10/09/12 0650  WBC 9.9  HGB 11.7*  HCT 35.3*  PLT 116*    Basename 10/09/12 0650  NA 143  K 4.8  CL 105  CO2 26  GLUCOSE 224*  BUN 36*  CREATININE 0.76  CALCIUM 8.7   CBG (last 3)   Basename 10/10/12 0416 10/10/12 0007 10/09/12 2002  GLUCAP 223* 210* 225*    Wt Readings from Last 3 Encounters:  10/10/12 58.7 kg (129 lb 6.6 oz)  10/02/12 56.4 kg (124 lb 5.4 oz)  10/02/12 56.4 kg (124 lb 5.4 oz)    Physical Exam:  Constitutional:  Patient with poor dental dentition  HENT:  Head: Normocephalic.  edentulous  Eyes:  Pupils round and reactive to light  Neck:  Cervical collar intact  Cardiovascular: Normal rate and regular rhythm.  Pulmonary/Chest: a few basilar crackles and rhonchi. He has no wheezes. Occasional cough  Abdominal: Soft. Bowel sounds are normal. He exhibits no distension. There is no tenderness. PEG tube site is clean and dry  Musculoskeletal: He exhibits no edema.  Neurological: He  is alert.  Reasonable insight and awareness. Was joking with me on occasion. Intermittent confusion today Patient made good eye contact with examiner. LUE is grossly 1 to 2+/5. RUE is 1- 2+/5. LE's are grossly 3+ to 4/5. Sensation slightly diminished left greater than right upper extremities. Speech dysarthric but intelligible  Skin:     Assessment/Plan: 1. Functional deficits secondary to C3-4 fx with central cord injury which require 3+ hours per day of interdisciplinary therapy in a comprehensive inpatient rehab setting. Physiatrist is providing close team supervision and 24 hour management of active medical problems listed below. Physiatrist and rehab team continue to assess barriers to discharge/monitor patient progress toward functional and medical goals. FIM: FIM - Bathing Bathing: 1: Total-Patient completes 0-2 of 10 parts or less than 25%  FIM - Upper Body Dressing/Undressing Upper body dressing/undressing: 1: Total-Patient completed less than 25% of tasks FIM - Lower Body Dressing/Undressing Lower body dressing/undressing: 1: Total-Patient completed less than 25% of tasks  FIM - Toileting Toileting: 0: Activity did not occur  FIM - Diplomatic Services operational officer Devices: Psychiatrist Transfers: 0-Activity did not occur  FIM - Banker Devices: HOB elevated Bed/Chair Transfer: 2: Supine > Sit: Max A (lifting assist/Pt. 25-49%);5: Sit > Supine: Supervision (verbal cues/safety issues);3: Bed > Chair or W/C: Mod A (lift or lower assist);3: Chair or W/C > Bed: Mod A (lift or lower assist)  FIM - Locomotion: Wheelchair Locomotion: Wheelchair: 1: Total  Assistance/staff pushes wheelchair (Pt<25%) FIM - Locomotion: Ambulation Ambulation/Gait Assistance: 3: Mod assist Locomotion: Ambulation: 1: Travels less than 50 ft with moderate assistance (Pt: 50 - 74%) (mod A HHA; min A parallel bars)  Comprehension Comprehension  Mode: Auditory Comprehension: 3-Understands basic 50 - 74% of the time/requires cueing 25 - 50%  of the time  Expression Expression Mode: Verbal Expression: 4-Expresses basic 75 - 89% of the time/requires cueing 10 - 24% of the time. Needs helper to occlude trach/needs to repeat words.  Social Interaction Social Interaction: 3-Interacts appropriately 50 - 74% of the time - May be physically or verbally inappropriate.  Problem Solving Problem Solving: 2-Solves basic 25 - 49% of the time - needs direction more than half the time to initiate, plan or complete simple activities  Memory Memory: 4-Recognizes or recalls 75 - 89% of the time/requires cueing 10 - 24% of the time  Medical Problem List and Plan:  1. 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 DVT Prophylaxis/Anticoagulation: SCDs. Monitor for any signs of DVT  3. Pain Management: Tylenol and robaxin prn  4. Mood/delirium/acute encephalopathy.still some sundowning and hs confusion   -admittedly drank "2-3 beers at night" PTA.  -?old CVA on head CT. I suspect he suffered a head trauma here as well. 5. Neuropsych: This patient is not capable of making decisions on his/her own behalf.  6. Dysphagia. Status post gastrostomy tube placement 10/01/2012 per Dr. Lindie Spruce.    -add another h20 flush through g tube 7. Aspiration pneumonia/ respiratory failure. -see below 8. Hypothyroidism. Synthroid  9. Diabetes mellitus. Check blood sugars every 4 hours while on tube feeds. lantus insulin 10 units q12--increase to q12. Patient was on tradgenta 5 mg daily prior to admission 10. Leukocytosis: aspiration pneumonia  -vanc and merem per ccm recs  -appreciate CCM follow up  -adjusted TF, keep HOB  -IS, FV, OOB routinely with therapy  -afebrile, sats are stable  -cxr with persistent infiltrate  LOS (Days) 7 A FACE TO FACE EVALUATION WAS PERFORMED  SWARTZ,ZACHARY T 10/10/2012, 6:57 AM
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
## 2012-10-10 NOTE — Progress Notes (Addendum)
Occupational Therapy Session Note & Weekly Progress Note  Patient Details  Name: [name] MRN: [id number] Date of Birth: 28-Sep-1931  Today's Date: 10/10/2012 Time: 4098-1191 Time Calculation (min): 45 min Patient missed 15 minutes of skilled OT, see below Individual Therapy Patient with complaints of pain in bilateral shoulders and sacrum area, no rate given - RN aware Patient found supine in bed. Therapist encouraged patient to get out of bed, but patient initially refused. Patient with statements, "I just want to go home for an hour or spend then night." and "I feel like y'all are locking me in here.". Therapist explained the importance of therapy and the importance of patient getting stronger in order to go home. After explanation patient nodded in understanding. Patient continued to refuse to get out of bed, so therapist told patient he could rest 15 minutes and therapist would be back in to get him OOB; patient willing. Therapist found patient in same position 15 minutes later and patient willing to get OOB and requested to get into w/c. Patient engaged in bed mobility and transferred into w/c with moderate assistance. Therapist positioned patient with LUE elevated. Patient left seated in w/c with call bell within reach. Immediately once patient seated in w/c, he requested to get back to bed, therapist discussed with patient importance of staying up in order to get stronger so he could potentially go home. Patient agreed to stay in w/c for 15 minutes. Notified Rn.   --------------------------------------------------------------------------------------------------------------------  WEEKLY PROGRESS NOTE Patient has met 1 of 5 short term goals. Patient is making slow progress on CIR. Patient not motivated to participate in therapies and has been dropped to 15/7 (2.5 hours of therapy over 7 day period). Patient consistently state's "I can't do it!" Patient requires max encouragement to  participate at all and has a very low OOB tolerance at this time. Patient currently requires total assist - total assist X2 for BADLs.  Patient continues to demonstrate the following deficits: decreased activity tolerance/endurance, decreased BADL independence, decreased strength throughout bilateral UEs, decreased functional use of bilateral UEs, increased pain, decreased dynamic standing balance/tolerance/endurance. Therefore, patient will continue to benefit from skilled OT intervention to enhance overall performance with BADL and Reduce care partner burden.  Patient is making slow progress toward LTGs.  Continue plan of care for now..  OT Short Term Goals Week 1:  OT Short Term Goal 1 (Week 1): Pt will sit EOB to don shirt with steady A OT Short Term Goal 1 - Progress (Week 1): Not met OT Short Term Goal 2 (Week 1): Pt will don shirt with mod A OT Short Term Goal 2 - Progress (Week 1): Not met OT Short Term Goal 3 (Week 1): Pt will transfer to toilet / BSC with mod A OT Short Term Goal 3 - Progress (Week 1): Met OT Short Term Goal 4 (Week 1): Pt and therapist will explore options for donning LB clothing with AE/ apaptive methods OT Short Term Goal 4 - Progress (Week 1): Not met OT Short Term Goal 5 (Week 1): Pt will be able to pull down pants for toileting with steady A OT Short Term Goal 5 - Progress (Week 1): Not met  Week 2:  OT Short Term Goal 1 (Week 2): Patient will tolerate being OOB, sitting in w/c or recliner, for at least an hour OT Short Term Goal 2 (Week 2): Patient will attempt to wash face using modified wash cloth, if needed, with set-up assistance OT Short Term  Goal 3 (Week 2): Therapist will complete PROM exercises to bilateral shoulders, elbows, forearms, wrist, and fingers up to patients tolerance level at least 4times a week in order to stretch muscles and help with strenthening weak UEs OT Short Term Goal 4 (Week 2): Patient will perform [facility] transfer with min  assist  Skilled Therapeutic Interventions/Progress Updates:  Balance/vestibular training;Community reintegration;Discharge planning;Disease mangement/prevention;DME/adaptive equipment instruction;Functional mobility training;Neuromuscular re-education;Pain management;Patient/family education;Psychosocial support;Self Care/advanced ADL retraining;Skin care/wound managment;Splinting/orthotics;Therapeutic Activities;Therapeutic Exercise;UE/LE Strength taining/ROM;UE/LE Coordination activities;Wheelchair propulsion/positioning   Precautions:  Precautions Precautions: Fall Cervical Brace: Hard collar Restrictions Weight Bearing Restrictions: No  See FIM for current functional status  Brigette Hopfer 10/10/2012, 12:07 PM

## 2012-10-10 NOTE — Progress Notes (Signed)
At begin of shift, patient daughter at the bedside and concerned about patient status. Tried to answer as many questions as I could regarding the treatments that are currently prescribed by the physician for the patient. Will notify MD upon rounds to clarify patient entire status to daughter if present.

## 2012-10-10 NOTE — Progress Notes (Signed)
ANTIBIOTIC CONSULT NOTE - INITIAL  Pharmacy Consult for Merrem Indication: persistent infiltrate on CXR  No Known Allergies  Patient Measurements: Height: 5' 8.9" (175 cm) Weight: 129 lb 6.6 oz (58.7 kg) IBW/kg (Calculated) : 70.47   Vital Signs: Temp: 97.6 F (36.4 C) (10/11 0500) Temp src: Oral (10/11 0500) BP: 132/52 mmHg (10/11 0500) Pulse Rate: 74  (10/11 0500)  Labs:  Basename 10/09/12 0650  WBC 9.9  HGB 11.7*  PLT 116*  LABCREA --  CREATININE 0.76   Estimated Creatinine Clearance: 63.2 ml/min (by C-G formula based on Cr of 0.76).  Microbiology: Recent Results (from the past 720 hour(s))  MRSA PCR SCREENING     Status: Normal   Collection Time   09/26/12 12:44 AM      Component Value Range Status Comment   MRSA by PCR NEGATIVE  NEGATIVE Final   URINE CULTURE     Status: Normal   Collection Time   10/07/12  8:40 AM      Component Value Range Status Comment   Specimen Description URINE, CLEAN CATCH   Final    Special Requests NONE   Final    Culture  Setup Time 10/07/2012 09:54   Final    Colony Count NO GROWTH   Final    Culture NO GROWTH   Final    Report Status 10/08/2012 FINAL   Final   CULTURE, BLOOD (ROUTINE X 2)     Status: Normal (Preliminary result)   Collection Time   10/07/12 12:06 PM      Component Value Range Status Comment   Specimen Description BLOOD RIGHT FOREARM   Final    Special Requests BOTTLES DRAWN AEROBIC AND ANAEROBIC 10CC   Final    Culture  Setup Time 10/07/2012 18:13   Final    Culture     Final    Value:        BLOOD CULTURE RECEIVED NO GROWTH TO DATE CULTURE WILL BE HELD FOR 5 DAYS BEFORE ISSUING A FINAL NEGATIVE REPORT   Report Status PENDING   Incomplete   CULTURE, BLOOD (ROUTINE X 2)     Status: Normal (Preliminary result)   Collection Time   10/07/12 12:10 PM      Component Value Range Status Comment   Specimen Description BLOOD RIGHT HAND   Final    Special Requests BOTTLES DRAWN AEROBIC AND ANAEROBIC 10CC   Final    Culture  Setup Time 10/07/2012 18:13   Final    Culture     Final    Value:        BLOOD CULTURE RECEIVED NO GROWTH TO DATE CULTURE WILL BE HELD FOR 5 DAYS BEFORE ISSUING A FINAL NEGATIVE REPORT   Report Status PENDING   Incomplete   CULTURE, RESPIRATORY     Status: Normal (Preliminary result)   Collection Time   10/09/12  7:40 PM      Component Value Range Status Comment   Specimen Description SPUTUM   Final    Special Requests     Final    Value: NONE V [place] RN NOTIFIED us AFTER BATCH WAS SENT TO [facility] THAT THIS SPECIMEN WAS A SPUTUM AND NOT A TRACHAEL ASPIRATE.    Gram Stain     Final    Value: FEW WBC PRESENT, PREDOMINANTLY PMN     ABUNDANT SQUAMOUS EPITHELIAL CELLS PRESENT     NO ORGANISMS SEEN   Culture PENDING   Incomplete    Report Status PENDING  Incomplete     Medical History: Past Medical History  Diagnosis Date  . DM2 (diabetes mellitus, type 2)   . Hyperlipidemia   . Hypertension   . Cerebral vascular disease   . HCC (hepatocellular carcinoma)     Medications:  Prescriptions prior to admission  Medication Sig Dispense Refill  . cyclobenzaprine (FLEXERIL) 10 MG tablet Take 10 mg by mouth at bedtime as needed.      Marland Kitchen esomeprazole (NEXIUM) 40 MG capsule Take 40 mg by mouth 2 (two) times daily.      . Fluticasone-Salmeterol (ADVAIR) 250-50 MCG/DOSE AEPB Inhale 2 puffs into the lungs daily.       Marland Kitchen levothyroxine (SYNTHROID, LEVOTHROID) 88 MCG tablet Take 88 mcg by mouth daily.      Marland Kitchen linagliptin (TRADJENTA) 5 MG TABS tablet Take 5 mg by mouth daily.      Marland Kitchen lisinopril (PRINIVIL,ZESTRIL) 10 MG tablet Take 10 mg by mouth daily.      . Oxycodone HCl 10 MG TABS Take 10 mg by mouth every 4 (four) hours as needed.       Scheduled:    . albuterol  2.5 mg Nebulization Q6H  . antiseptic oral rinse  15 mL Mouth Rinse QID  . aspirin  81 mg Per Tube Daily  . budesonide  0.5 mg Nebulization BID  . feeding supplement  30 mL Per Tube Daily  . folic acid  1 mg Per Tube Daily   . free water  200 mL Per Tube 5 X Daily  . insulin aspart  0-5 Units Subcutaneous QHS  . insulin aspart  0-9 Units Subcutaneous Q4H  . insulin glargine  12 Units Subcutaneous BID  . ipratropium  0.5 mg Nebulization Q6H  . levothyroxine  88 mcg Per Tube QAC breakfast  . meropenem (MERREM) IV  1 g Intravenous Q8H  . QUEtiapine  50 mg Oral QHS  . thiamine  100 mg Per Tube Daily  . vancomycin  1,250 mg Intravenous Q24H  . DISCONTD: free water  200 mL Per Tube Q6H  . DISCONTD: insulin glargine  10 Units Subcutaneous BID  . DISCONTD: piperacillin-tazobactam (ZOSYN)  IV  3.375 g Intravenous Q8H    Assessment: 78yo male c/o delirium, found with leukocytosis and CXR with persistent infiltrate despite IV ABX on board, to add Merrem per CCM recommendation for aspiration PNA.  Plan:  Will begin Merrem 1g IV Q8H for CrCl >50 ml/min and monitor.  Colleen Can PharmD BCPS 10/10/2012,7:18 AM
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
## 2012-10-11 ENCOUNTER — Inpatient Hospital Stay (HOSPITAL_COMMUNITY): Payer: PRIVATE HEALTH INSURANCE | Admitting: Speech Pathology

## 2012-10-11 ENCOUNTER — Inpatient Hospital Stay (HOSPITAL_COMMUNITY): Payer: PRIVATE HEALTH INSURANCE | Admitting: Physical Therapy

## 2012-10-11 LAB — GLUCOSE, CAPILLARY
Glucose-Capillary: 281 mg/dL — ABNORMAL HIGH (ref 70–99)
Glucose-Capillary: 329 mg/dL — ABNORMAL HIGH (ref 70–99)
Glucose-Capillary: 264 mg/dL — ABNORMAL HIGH (ref 70–99)

## 2012-10-11 LAB — BASIC METABOLIC PANEL
BUN: 27 mg/dL — ABNORMAL HIGH (ref 6–23)
CO2: 30 mEq/L (ref 19–32)
Chloride: 99 mEq/L (ref 96–112)
Creatinine, Ser: 0.59 mg/dL (ref 0.50–1.35)
Glucose, Bld: 271 mg/dL — ABNORMAL HIGH (ref 70–99)

## 2012-10-11 LAB — CBC
HCT: 34.7 % — ABNORMAL LOW (ref 39.0–52.0)
MCV: 91.3 fL (ref 78.0–100.0)
RBC: 3.8 MIL/uL — ABNORMAL LOW (ref 4.22–5.81)
WBC: 7 10*3/uL (ref 4.0–10.5)

## 2012-10-11 NOTE — Progress Notes (Signed)
Patient appears to be resting well after Seroquel given as ordered. Occasionally removes venturi mask to forehead, nurse readjusts appropriately.

## 2012-10-11 NOTE — Plan of Care (Signed)
Problem: SCI BOWEL ELIMINATION Goal: RH STG MANAGE BOWEL WITH ASSISTANCE STG Manage Bowel with Assistance. Minimal A  Outcome: Not Progressing LBM 10/10 per report, smeared BM on 10/11

## 2012-10-11 NOTE — Progress Notes (Signed)
Physical Therapy Session Note  Patient Details  Name: [name] MRN: [id number] Date of Birth: 11-20-1931  Today's Date: 10/11/2012 Time: 1400-1430 Time Calculation (min): 30 min  Short Term Goals: Week 1:  PT Short Term Goal 1 (Week 1): Pt will transfer sit to stand to sit 3/4 trials for improved transfers and activity tolerance PT Short Term Goal 1 - Progress (Week 1): Progressing toward goal PT Short Term Goal 2 (Week 1): Pt will gait 20 ft total A x 2 pt =50% controlled environement PT Short Term Goal 2 - Progress (Week 1): Partly met (mod A of 1 x 5') PT Short Term Goal 3 (Week 1): Pt will perform bed mobility supine to sit min A in preparation for OOB transfer PT Short Term Goal 3 - Progress (Week 1): Not met (max A) PT Short Term Goal 4 (Week 1): Pt will propel w/c with LE's 50 ft controlled environment min A for mobility, activity tolerance and LE strengthening PT Short Term Goal 4 - Progress (Week 1): Not met (max A still needed due to participation) Week 2:  PT Short Term Goal 1 (Week 2): Pt will tolerate OOB x 1 hr a day at least PT Short Term Goal 2 (Week 2): Pt will gait with mod A x 15' for functional mobility PT Short Term Goal 3 (Week 2): Pt will complete supine to sit with mod A for improved bed mobility PT Short Term Goal 4 (Week 2): Pt will transfer 3/4 trials with mod A s  Skilled Therapeutic Interventions/Progress Updates:   Patient receiving breathing treatment; patient agreed to therapy in the room.  Patient performed supine > sit EOB with HOB elevated with max A to bring trunk upright to EOB but patient able to bring bilat LE to EOB.  Patient sat EOB x 20 minutes with supervision-min A for sitting balance during bilat LE exercises for strength and postural/trunk control training: 10 reps each heel raises, toe raises, hip flexion, knee extension with intermittent rest breaks.  Attempted to have patient perform sit <> stand and static standing EOB with UE support  on chair for pressure relief on buttocks but patient refused to stand stating he didn't feel like it today.  Patient requesting to lie back down; returned to supine and repositioned at [facility] with mod A.     Therapy Documentation Precautions:  Precautions Precautions: Fall Cervical Brace: Hard collar Restrictions Weight Bearing Restrictions: No Vital Signs: Therapy Vitals Temp: 98.1 F (36.7 C) Temp src: Oral Pulse Rate: 60  Resp: 19  BP: 117/74 mmHg Patient Position, if appropriate: Lying Oxygen Therapy SpO2: 97 % O2 Device: Nasal cannula O2 Flow Rate (L/min): 3 L/min Pain: Pain Assessment Pain Assessment: No/denies pain  See FIM for current functional status  Therapy/Group: Individual Therapy  Edman Circle [facility] 10/11/2012, 2:46 PM

## 2012-10-11 NOTE — Progress Notes (Signed)
Subjective/Complaints:  A 12 point review of systems has been performed and if not noted above is otherwise negative.  Objective: Vital Signs: Blood pressure 146/67, pulse 78, temperature 95.8 F (35.4 C), temperature source Axillary, resp. rate 17, height 5' 8.9" (1.75 m), weight 61.5 kg (135 lb 9.3 oz), SpO2 97.00%. No results found.  Basename 10/11/12 0620 10/09/12 0650  WBC 7.0 9.9  HGB 11.4* 11.7*  HCT 34.7* 35.3*  PLT 213 116*    Basename 10/09/12 0650  NA 143  K 4.8  CL 105  CO2 26  GLUCOSE 224*  BUN 36*  CREATININE 0.76  CALCIUM 8.7   CBG (last 3)   Basename 10/11/12 0734 10/11/12 0405 10/10/12 2353  GLUCAP 279* 281* 256*    Wt Readings from Last 3 Encounters:  10/11/12 61.5 kg (135 lb 9.3 oz)  10/02/12 56.4 kg (124 lb 5.4 oz)  10/02/12 56.4 kg (124 lb 5.4 oz)    Physical Exam:  Constitutional:  Patient with poor dental dentition  HENT:  Head: Normocephalic.  edentulous  Eyes:  Pupils round and reactive to light  Neck:  Cervical collar intact  Cardiovascular: Normal rate and regular rhythm.  Pulmonary/Chest: a few basilar crackles and rhonchi. He has no wheezes. Occasional cough  Abdominal: Soft. Bowel sounds are normal. He exhibits no distension. There is no tenderness. PEG tube site is clean  Musculoskeletal: He exhibits no edema.  Neurological: He is alert.  Reasonable insight and awareness. Was joking with me on occasion. Intermittent confusion today Patient made good eye contact with examiner. LUE and BUE are 3 to 3+/5.  LE's are grossly 3+ to 4/5. Sensation slightly diminished left greater than right upper extremities. Speech dysarthric but intelligible  Skin:     Assessment/Plan: 1. Functional deficits secondary to C3-4 fx with central cord injury which require 3+ hours per day of interdisciplinary therapy in a comprehensive inpatient rehab setting. Physiatrist is providing close team supervision and 24 hour management of active medical  problems listed below. Physiatrist and rehab team continue to assess barriers to discharge/monitor patient progress toward functional and medical goals. FIM: FIM - Bathing Bathing: 1: Total-Patient completes 0-2 of 10 parts or less than 25%  FIM - Upper Body Dressing/Undressing Upper body dressing/undressing: 1: Total-Patient completed less than 25% of tasks FIM - Lower Body Dressing/Undressing Lower body dressing/undressing: 1: Total-Patient completed less than 25% of tasks  FIM - Toileting Toileting: 0: Activity did not occur  FIM - Diplomatic Services operational officer Devices: Psychiatrist Transfers: 0-Activity did not occur  FIM - Banker Devices: HOB elevated Bed/Chair Transfer: 2: Supine > Sit: Max A (lifting assist/Pt. 25-49%);5: Sit > Supine: Supervision (verbal cues/safety issues);3: Bed > Chair or W/C: Mod A (lift or lower assist);3: Chair or W/C > Bed: Mod A (lift or lower assist)  FIM - Locomotion: Wheelchair Locomotion: Wheelchair: 1: Total Assistance/staff pushes wheelchair (Pt<25%) FIM - Locomotion: Ambulation Ambulation/Gait Assistance: 3: Mod assist Locomotion: Ambulation: 1: Travels less than 50 ft with moderate assistance (Pt: 50 - 74%) (mod A HHA; min A parallel bars)  Comprehension Comprehension Mode: Auditory Comprehension: 2-Understands basic 25 - 49% of the time/requires cueing 51 - 75% of the time  Expression Expression Mode: Verbal Expression: 3-Expresses basic 50 - 74% of the time/requires cueing 25 - 50% of the time. Needs to repeat parts of sentences.  Social Interaction Social Interaction: 3-Interacts appropriately 50 - 74% of the time - May be physically or verbally  inappropriate.  Problem Solving Problem Solving: 2-Solves basic 25 - 49% of the time - needs direction more than half the time to initiate, plan or complete simple activities  Memory Memory: 3-Recognizes or recalls 50 - 74%  of the time/requires cueing 25 - 49% of the time  Medical Problem List and Plan:  1. 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 DVT Prophylaxis/Anticoagulation: SCDs. Monitor for any signs of DVT  3. Pain Management: Tylenol and robaxin prn  4. Mood/delirium/acute encephalopathy.still some sundowning and hs confusion   -admittedly drank "2-3 beers at night" PTA.  -?old CVA on head CT. I suspect he suffered a head trauma here as well. 5. Neuropsych: This patient is not capable of making decisions on his/her own behalf.  6. Dysphagia. Status post gastrostomy tube placement 10/01/2012 per Dr. Lindie Spruce.    -add another h20 flush through g tube 7. Aspiration pneumonia/ respiratory failure. -see below 8. Hypothyroidism. Synthroid  9. Diabetes mellitus. Check blood sugars every 4 hours while on tube feeds. lantus insulin 10 units q12--increase to q15. Patient was on tradgenta 5 mg daily prior to admission 10. Leukocytosis: aspiration pneumonia  -vanc and merem per ccm recs, respiratory culture still pending  -appreciate CCM follow up  -adjusted TF, keep HOB  -IS, FV, OOB routinely with therapy  -afebrile, sats are stable, still with cough and secretions  -will check cxr tomorrow  LOS (Days) 8 A FACE TO FACE EVALUATION WAS PERFORMED  SWARTZ,ZACHARY T 10/11/2012, 7:50 AM

## 2012-10-11 NOTE — Progress Notes (Signed)
Speech Language Pathology Daily Session Note  Patient Details  Name: [name] MRN: [id number] Date of Birth: 1931/06/28  Today's Date: 10/11/2012 Time: 4098-1191 Time Calculation (min): 25 min  Short Term Goals: Week 1: SLP Short Term Goal 1 (Week 1): Pt will demonstrate improved management of secretions with mod assist to expectorate/use oral suctioning. SLP Short Term Goal 1 - Progress (Week 1): Progressing toward goal SLP Short Term Goal 2 (Week 1): Pt will demonstrate use of laryngeal closure techniques (eg. supraglottic swallow, glottal closure) in an effort to assist with airway protection during functional swallowing with max assist.  SLP Short Term Goal 2 - Progress (Week 1): Not progressing SLP Short Term Goal 3 (Week 1): Pt will sustain attention to a functional task for ~5 minutes with Max A verbal cues.  SLP Short Term Goal 3 - Progress (Week 1): Progressing toward goal SLP Short Term Goal 4 (Week 1): Pt will demonstrate functional problem solving for functional and familiar tasks with Max A verbal and question cues.  SLP Short Term Goal 4 - Progress (Week 1): Progressing toward goal  Skilled Therapeutic Interventions: Treatment focus on problem solving and participation. Pt independently utilized the calendar to recall the date and required Min verbal cues for problems solving with utilization of his RUE to perform oral care. Pt independently requested wants/needs but required Mod verbal cues to attempt functional tasks independently (wiping face with a washcloth). Pt refused participation in dysphagia treatment and reported, "he will do it Monday." Pt missed 20 minutes of intervention at end of session.     FIM:  Comprehension Comprehension Mode: Auditory Comprehension: 3-Understands basic 50 - 74% of the time/requires cueing 25 - 50%  of the time Expression Expression Mode: Verbal Expression: 4-Expresses basic 75 - 89% of the time/requires cueing 10 - 24% of the  time. Needs helper to occlude trach/needs to repeat words. Social Interaction Social Interaction: 3-Interacts appropriately 50 - 74% of the time - May be physically or verbally inappropriate. Problem Solving Problem Solving: 2-Solves basic 25 - 49% of the time - needs direction more than half the time to initiate, plan or complete simple activities Memory Memory: 3-Recognizes or recalls 50 - 74% of the time/requires cueing 25 - 49% of the time  Pain Pt reports pain in his shoulders, pt repositioned and premedicated   Therapy/Group: Individual Therapy  Howard Patton 10/11/2012, 11:06 AM

## 2012-10-11 NOTE — Progress Notes (Signed)
Patient tube feeding going in. Some drainage same noted to new dressing. No new swelling noted. Patient reports no pain with tube feeds or medication administration. Continue with plan of care.

## 2012-10-11 NOTE — Progress Notes (Signed)
Area around peg tube red- opening appears bigger. Yellowish tube feeding like drainage noted on dressing. Dr Riley Kill notified and able to see peg site. No new orders received. Continue to monitor. New dressing applied. Patient denies any pain at peg site.

## 2012-10-12 ENCOUNTER — Inpatient Hospital Stay (HOSPITAL_COMMUNITY): Payer: PRIVATE HEALTH INSURANCE

## 2012-10-12 ENCOUNTER — Inpatient Hospital Stay (HOSPITAL_COMMUNITY): Payer: PRIVATE HEALTH INSURANCE | Admitting: *Deleted

## 2012-10-12 LAB — CULTURE, RESPIRATORY W GRAM STAIN

## 2012-10-12 LAB — GLUCOSE, CAPILLARY
Glucose-Capillary: 221 mg/dL — ABNORMAL HIGH (ref 70–99)
Glucose-Capillary: 227 mg/dL — ABNORMAL HIGH (ref 70–99)
Glucose-Capillary: 233 mg/dL — ABNORMAL HIGH (ref 70–99)
Glucose-Capillary: 260 mg/dL — ABNORMAL HIGH (ref 70–99)

## 2012-10-12 MED ORDER — INSULIN GLARGINE 100 UNIT/ML ~~LOC~~ SOLN
15.0000 [IU] | Freq: Two times a day (BID) | SUBCUTANEOUS | Status: DC
  Administered 2012-10-12 (×2): 15 [IU] via SUBCUTANEOUS

## 2012-10-12 MED ORDER — ALBUTEROL SULFATE (5 MG/ML) 0.5% IN NEBU
2.5000 mg | INHALATION_SOLUTION | RESPIRATORY_TRACT | Status: DC | PRN
  Filled 2012-10-12 (×2): qty 0.5

## 2012-10-12 MED ORDER — ALBUTEROL SULFATE (5 MG/ML) 0.5% IN NEBU
2.5000 mg | INHALATION_SOLUTION | Freq: Four times a day (QID) | RESPIRATORY_TRACT | Status: DC
  Administered 2012-10-12 – 2012-10-14 (×7): 2.5 mg via RESPIRATORY_TRACT
  Filled 2012-10-12 (×7): qty 0.5

## 2012-10-12 NOTE — Progress Notes (Addendum)
Subjective/Complaints: Up sitting eob with OT. Feels fairly well today.   A 12 point review of systems has been performed and if not noted above is otherwise negative.  Objective: Vital Signs: Blood pressure 130/65, pulse 70, temperature 97.6 F (36.4 C), temperature source Oral, resp. rate 17, height 5' 8.9" (1.75 m), weight 59.8 kg (131 lb 13.4 oz), SpO2 100.00%. No results found.  Basename 10/11/12 0620  WBC 7.0  HGB 11.4*  HCT 34.7*  PLT 213    Basename 10/11/12 0620  NA 139  K 4.2  CL 99  CO2 30  GLUCOSE 271*  BUN 27*  CREATININE 0.59  CALCIUM 9.1   CBG (last 3)   Basename 10/12/12 0337 10/12/12 0013 10/11/12 2004  GLUCAP 232* 227* 206*    Wt Readings from Last 3 Encounters:  10/12/12 59.8 kg (131 lb 13.4 oz)  10/02/12 56.4 kg (124 lb 5.4 oz)  10/02/12 56.4 kg (124 lb 5.4 oz)    Physical Exam:  Constitutional:  Patient with poor dental dentition  HENT:  Head: Normocephalic.  edentulous  Eyes:  Pupils round and reactive to light  Neck:  Cervical collar intact  Cardiovascular: Normal rate and regular rhythm.  Pulmonary/Chest: a few basilar crackles less rhonchi.  He has no wheezes. No distress  Abdominal: Soft. Bowel sounds are normal. He exhibits no distension. There is no tenderness. PEG tube site is clean  Musculoskeletal: He exhibits no edema.  Neurological: He is alert.  Reasonable insight and awareness. Was joking with me on occasion. Intermittent confusion today Patient made good eye contact with examiner. LUE and BUE are 3 to 3+/5.  LE's are grossly 3+ to 4/5. Sensation slightly diminished left greater than right upper extremities. Speech dysarthric but intelligible  Skin:     Assessment/Plan: 1. Functional deficits secondary to C3-4 fx with central cord injury which require 3+ hours per day of interdisciplinary therapy in a comprehensive inpatient rehab setting. Physiatrist is providing close team supervision and 24 hour management of active  medical problems listed below. Physiatrist and rehab team continue to assess barriers to discharge/monitor patient progress toward functional and medical goals. FIM: FIM - Bathing Bathing: 1: Total-Patient completes 0-2 of 10 parts or less than 25%  FIM - Upper Body Dressing/Undressing Upper body dressing/undressing: 1: Total-Patient completed less than 25% of tasks FIM - Lower Body Dressing/Undressing Lower body dressing/undressing: 1: Total-Patient completed less than 25% of tasks  FIM - Toileting Toileting: 0: Activity did not occur  FIM - Diplomatic Services operational officer Devices: Psychiatrist Transfers: 0-Activity did not occur  FIM - Banker Devices: HOB elevated Bed/Chair Transfer: 2: Supine > Sit: Max A (lifting assist/Pt. 25-49%);3: Sit > Supine: Mod A (lifting assist/Pt. 50-74%/lift 2 legs)  FIM - Locomotion: Wheelchair Locomotion: Wheelchair: 0: Activity did not occur FIM - Locomotion: Ambulation Ambulation/Gait Assistance: 3: Mod assist Locomotion: Ambulation: 0: Activity did not occur  Comprehension Comprehension Mode: Auditory Comprehension: 3-Understands basic 50 - 74% of the time/requires cueing 25 - 50%  of the time  Expression Expression Mode: Verbal Expression: 4-Expresses basic 75 - 89% of the time/requires cueing 10 - 24% of the time. Needs helper to occlude trach/needs to repeat words.  Social Interaction Social Interaction: 3-Interacts appropriately 50 - 74% of the time - May be physically or verbally inappropriate.  Problem Solving Problem Solving: 2-Solves basic 25 - 49% of the time - needs direction more than half the time to initiate, plan or complete  simple activities  Memory Memory: 3-Recognizes or recalls 50 - 74% of the time/requires cueing 25 - 49% of the time  Medical Problem List and Plan:  1. 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 DVT  Prophylaxis/Anticoagulation: SCDs. Monitor for any signs of DVT  3. Pain Management: Tylenol and robaxin prn  4. Mood/delirium/acute encephalopathy.still some sundowning and hs confusion   -admittedly drank "2-3 beers at night" PTA.  -?old CVA on head CT. I suspect he suffered a head trauma here as well. 5. Neuropsych: This patient is not capable of making decisions on his/her own behalf.  6. Dysphagia. Status post gastrostomy tube placement 10/01/2012 per Dr. Lindie Spruce.    -add another h20 flush through g tube 7. Aspiration pneumonia/ respiratory failure. -see below 8. Hypothyroidism. Synthroid  9. Diabetes mellitus. Check blood sugars every 4 hours while on tube feeds. lantus insulin 10 units q12--increase to 15 units (didn't increase yesterday). Patient was on tradgenta 5 mg daily prior to admission 10. Leukocytosis: aspiration pneumonia  -vanc and merem per ccm recs, respiratory culture still pending  -appreciate CCM follow up  -adjusted TF, keep HOB. Will need to go home on continuous  -IS, FV, OOB routinely with therapy  -afebrile, sats are stable, still with cough and secretions  -cxr appeared slightly improved today  LOS (Days) 9 A FACE TO FACE EVALUATION WAS PERFORMED  Taos Tapp T 10/12/2012, 7:46 AM

## 2012-10-12 NOTE — Progress Notes (Signed)
Occupational Therapy Session Note  Patient Details  Name: RUPPERT MCLENNON MRN: [id number] Date of Birth: [birth year]  Today's Date: 10/12/2012 Time:  - 0730-0830  ( )    Short Term Goals: Week 1:  OT Short Term Goal 1 (Week 1): Pt will sit EOB to don shirt with steady A OT Short Term Goal 1 - Progress (Week 1): Not met OT Short Term Goal 2 (Week 1): Pt will don shirt with mod A OT Short Term Goal 2 - Progress (Week 1): Not met OT Short Term Goal 3 (Week 1): Pt will transfer to toilet / BSC with mod A OT Short Term Goal 3 - Progress (Week 1): Met OT Short Term Goal 4 (Week 1): Pt and therapist will explore options for donning LB clothing with AE/ apaptive methods OT Short Term Goal 4 - Progress (Week 1): Not met OT Short Term Goal 5 (Week 1): Pt will be able to pull down pants for toileting with steady A OT Short Term Goal 5 - Progress (Week 1): Not met Week 2:  OT Short Term Goal 1 (Week 2): Patient will tolerate being OOB, sitting in w/c or recliner, for at least an hour OT Short Term Goal 2 (Week 2): Patient will attempt to wash face using modified wash cloth, if needed, with set-up assistance OT Short Term Goal 3 (Week 2): Therapist will complete PROM exercises to bilateral shoulders, elbows, forearms, wrist, and fingers up to patients tolerance level at least 4times a week in order to stretch muscles and help with strenthening weak UEs OT Short Term Goal 4 (Week 2): Patient will perform [facility] transfer with min assist  Skilled Therapeutic Interventions/Progress Updates:    Pt. Lying in bed upion OT arrival,  Daughter and grandson present.  Pt. Stated he was hurting all over but did not want anything for pain.  Supine to sit EOB with max assist.  Sat for 15 minutes unsupported with purposely leaning backward x2.  Performed bathing and dressing.  Pt. Declined to get in wc for 1 hour.  Said he would later.  Utilized both UE for bathing and brushing gums with mod to max hand over hand  assist.  Pt. Stated " I can't do it."  Pt. Washed face with mod assist to guide and place cloth. Completed PROM to BUE during session.       Positioned pt on back with pillows under arms and call bell pinned to gown.  Completed PROM to BUE during session.         Therapy Documentation Precautions:  Precautions Precautions: Fall Cervical Brace: Hard collar Restrictions Weight Bearing Restrictions: No General:   Vital Signs: O2=  90% during bathing EOB without O2.  With O2= 98 %.    Pain:  All over;  10/10.  See MAR.      Exercises:  Performed PROM and AAROM during grooming and bathing   Other Treatments:    See FIM for current functional status  Therapy/Group: Individual Therapy  Humberto Seals 10/12/2012, 7:26 AM

## 2012-10-12 NOTE — Progress Notes (Signed)
Tube site continuing to drain tan, yellow drainage and saturating dressings. Dressing changed twice this shift. Dr Riley Kill notified of continual drainage. No new orders received. Patient denies any pain with feedings and flushes. Continue with plan of care.

## 2012-10-12 NOTE — Progress Notes (Signed)
Patient is more restless this shift, moving continuously while in the bed after readjustment. Patient denies any pain. Offered Ambien and Ativan around 0025, and patient agreed. No difference noted in patient behavior. Coughing has diminished this evening, but is unable to fall asleep. Offered tylenol to patient and patient agreed. Awaiting effects of Tylenol. Will continue to monitor patient.

## 2012-10-12 NOTE — Progress Notes (Signed)
Patient has not fallen asleep since Tylenol given, asking for a lady named "[name]" is still here and talking when no one else is in the room. Requires frequent repositioning in the bed.

## 2012-10-13 ENCOUNTER — Inpatient Hospital Stay (HOSPITAL_COMMUNITY): Payer: PRIVATE HEALTH INSURANCE | Admitting: Occupational Therapy

## 2012-10-13 ENCOUNTER — Inpatient Hospital Stay (HOSPITAL_COMMUNITY): Payer: PRIVATE HEALTH INSURANCE | Admitting: Speech Pathology

## 2012-10-13 ENCOUNTER — Inpatient Hospital Stay (HOSPITAL_COMMUNITY): Payer: PRIVATE HEALTH INSURANCE

## 2012-10-13 DIAGNOSIS — Z5189 Encounter for other specified aftercare: Secondary | ICD-10-CM

## 2012-10-13 DIAGNOSIS — S069X9A Unspecified intracranial injury with loss of consciousness of unspecified duration, initial encounter: Secondary | ICD-10-CM

## 2012-10-13 DIAGNOSIS — IMO0002 Reserved for concepts with insufficient information to code with codable children: Secondary | ICD-10-CM

## 2012-10-13 DIAGNOSIS — R1310 Dysphagia, unspecified: Secondary | ICD-10-CM

## 2012-10-13 DIAGNOSIS — J690 Pneumonitis due to inhalation of food and vomit: Secondary | ICD-10-CM

## 2012-10-13 LAB — GLUCOSE, CAPILLARY
Glucose-Capillary: 248 mg/dL — ABNORMAL HIGH (ref 70–99)
Glucose-Capillary: 288 mg/dL — ABNORMAL HIGH (ref 70–99)
Glucose-Capillary: 309 mg/dL — ABNORMAL HIGH (ref 70–99)
Glucose-Capillary: 123 mg/dL — ABNORMAL HIGH (ref 70–99)

## 2012-10-13 LAB — CULTURE, BLOOD (ROUTINE X 2)

## 2012-10-13 MED ORDER — PREDNISONE 20 MG PO TABS
40.0000 mg | ORAL_TABLET | Freq: Every day | ORAL | Status: DC
  Administered 2012-10-14 – 2012-10-17 (×4): 40 mg
  Filled 2012-10-13 (×5): qty 2

## 2012-10-13 MED ORDER — DEXTROSE 5 % IV SOLN
2.0000 g | INTRAVENOUS | Status: DC
  Administered 2012-10-13: 2 g via INTRAVENOUS
  Filled 2012-10-13: qty 2

## 2012-10-13 MED ORDER — DEXTROSE 5 % IV SOLN
2.0000 g | INTRAVENOUS | Status: AC
  Administered 2012-10-14 – 2012-10-15 (×2): 2 g via INTRAVENOUS
  Filled 2012-10-13 (×2): qty 2

## 2012-10-13 MED ORDER — IOHEXOL 300 MG/ML  SOLN
50.0000 mL | Freq: Once | INTRAMUSCULAR | Status: AC | PRN
  Administered 2012-10-13: 50 mL

## 2012-10-13 MED ORDER — INSULIN GLARGINE 100 UNIT/ML ~~LOC~~ SOLN
20.0000 [IU] | Freq: Two times a day (BID) | SUBCUTANEOUS | Status: DC
  Administered 2012-10-13 – 2012-10-15 (×4): 20 [IU] via SUBCUTANEOUS

## 2012-10-13 NOTE — Progress Notes (Signed)
Will get gastrograffin study to make sure the tube has not been dislodged.  Marta Lamas. Gae Bon, MD, FACS [phone] [facility]

## 2012-10-13 NOTE — Progress Notes (Signed)
Patient ID: [name], male   DOB: [birth year], 78 y.o.   MRN: [id number]    Subjective: Pt wo complaints x draining from around PEG tube that started yesterday, worse when up or coughs, appears to be TF, pt denies pain  Objective: Vital signs in last 24 hours: Temp:  [97.3 F (36.3 C)-97.5 F (36.4 C)] 97.3 F (36.3 C) (10/14 0518) Pulse Rate:  [67-75] 67  (10/14 0518) Resp:  [15-16] 16  (10/14 0518) BP: (128-137)/(46-61) 128/46 mmHg (10/14 0518) SpO2:  [92 %-100 %] 92 % (10/14 1130) Last BM Date: 10/13/12  Intake/Output from previous day: 10/13 0701 - 10/14 0700 In: 0  Out: 1951 [Urine:1950; Stool:1] Intake/Output this shift: Total I/O In: -  Out: 900 [Urine:900]  PE: Abd: soft, non tender, +BS, PEG tube site without erythema, wide opening with what appears to be TFs but does not look like purulence  Lab Results:   [facility] 10/11/12 0620  WBC 7.0  HGB 11.4*  HCT 34.7*  PLT 213   BMET  Basename 10/11/12 0620  NA 139  K 4.2  CL 99  CO2 30  GLUCOSE 271*  BUN 27*  CREATININE 0.59  CALCIUM 9.1   PT/INR No results found for this basename: LABPROT:2,INR:2 in the last 72 hours CMP     Component Value Date/Time   NA 139 10/11/2012 0620   K 4.2 10/11/2012 0620   CL 99 10/11/2012 0620   CO2 30 10/11/2012 0620   GLUCOSE 271* 10/11/2012 0620   BUN 27* 10/11/2012 0620   CREATININE 0.59 10/11/2012 0620   CALCIUM 9.1 10/11/2012 0620   PROT 6.4 10/06/2012 0545   ALBUMIN 2.0* 10/06/2012 0545   AST 97* 10/06/2012 0545   ALT 50 10/06/2012 0545   ALKPHOS 159* 10/06/2012 0545   BILITOT 0.7 10/06/2012 0545   GFRNONAA >90 10/11/2012 0620   GFRAA >90 10/11/2012 0620   Lipase     Component Value Date/Time   LIPASE 33 11/23/2010 0605       Studies/Results: Dg Chest 2 View  10/12/2012  *RADIOLOGY REPORT*  Clinical Data: Pneumonia.  CHEST - 2 VIEW  Comparison: 10/09/2012  Findings: Lordotic positioning noted. Decreased right lower lobe infiltrate is seen compared  to prior study.  Tiny posterior pleural effusions noted.  Heart size is within normal limits.  IMPRESSION: Improving right lower lobe infiltrate.   Original Report Authenticated By: Danae Orleans, M.D.     Anti-infectives: Anti-infectives     Start     Dose/Rate Route Frequency Ordered Stop   10/13/12 1015   cefTRIAXone (ROCEPHIN) 2 g in dextrose 5 % 50 mL IVPB        2 g 100 mL/hr over 30 Minutes Intravenous Every 24 hours 10/13/12 1008     10/11/12 0800   vancomycin (VANCOCIN) 750 mg in sodium chloride 0.9 % 150 mL IVPB  Status:  Discontinued        750 mg 150 mL/hr over 60 Minutes Intravenous Every 12 hours 10/10/12 2215 10/13/12 1008   10/10/12 0730   meropenem (MERREM) 1 g in sodium chloride 0.9 % 100 mL IVPB  Status:  Discontinued        1 g 200 mL/hr over 30 Minutes Intravenous 3 times per day 10/10/12 0717 10/13/12 1008   10/07/12 1500   vancomycin (VANCOCIN) 1,250 mg in sodium chloride 0.9 % 250 mL IVPB  Status:  Discontinued        1,250 mg 166.7 mL/hr over 90 Minutes  Intravenous Every 24 hours 10/07/12 1350 10/10/12 2214   10/07/12 1000   piperacillin-tazobactam (ZOSYN) IVPB 3.375 g  Status:  Discontinued        3.375 g 12.5 mL/hr over 240 Minutes Intravenous 3 times per day 10/07/12 0924 10/10/12 0708           Assessment/Plan  1.  PEG tube for dysphagia: appears to have TF leaking around the site,. May have been displaced, will hold TFs and order gastrograffin injection to verify placement of the PEG.   LOS: 10 days    Sherita Decoste 10/13/2012

## 2012-10-13 NOTE — Progress Notes (Signed)
Subjective/Complaints: Had some difficulty sleeping last night.  Breathing has been a little better  A 12 point review of systems has been performed and if not noted above is otherwise negative.  Objective: Vital Signs: Blood pressure 128/46, pulse 67, temperature 97.3 F (36.3 C), temperature source Oral, resp. rate 16, height 5' 8.9" (1.75 m), weight 59.8 kg (131 lb 13.4 oz), SpO2 100.00%. Dg Chest 2 View  10/12/2012  *RADIOLOGY REPORT*  Clinical Data: Pneumonia.  CHEST - 2 VIEW  Comparison: 10/09/2012  Findings: Lordotic positioning noted. Decreased right lower lobe infiltrate is seen compared to prior study.  Tiny posterior pleural effusions noted.  Heart size is within normal limits.  IMPRESSION: Improving right lower lobe infiltrate.   Original Report Authenticated By: Danae Orleans, M.D.     Basename 10/11/12 0620  WBC 7.0  HGB 11.4*  HCT 34.7*  PLT 213    Basename 10/11/12 0620  NA 139  K 4.2  CL 99  CO2 30  GLUCOSE 271*  BUN 27*  CREATININE 0.59  CALCIUM 9.1   CBG (last 3)   Basename 10/13/12 0356 10/12/12 2357 10/12/12 1943  GLUCAP 248* 277* 278*    Wt Readings from Last 3 Encounters:  10/12/12 59.8 kg (131 lb 13.4 oz)  10/02/12 56.4 kg (124 lb 5.4 oz)  10/02/12 56.4 kg (124 lb 5.4 oz)    Physical Exam:  Constitutional:  Patient with poor dental dentition  HENT:  Head: Normocephalic.  edentulous  Eyes:  Pupils round and reactive to light  Neck:  Cervical collar intact  Cardiovascular: Normal rate and regular rhythm.  Pulmonary/Chest: a few basilar crackles less rhonchi.  He has no wheezes. No distress  Abdominal: Soft. Bowel sounds are normal. He exhibits no distension. There is no tenderness. PEG tube site is clean  Musculoskeletal: He exhibits no edema.  Neurological: He is alert.  Reasonable insight and awareness. Was joking with me on occasion. Intermittent confusion today Patient made good eye contact with examiner. LUE and BUE are 3 to 3+/5.   LE's are grossly 3+ to 4/5. Sensation slightly diminished left greater than right upper extremities. Speech dysarthric but intelligible. Voice is stronger. Skin:     Assessment/Plan: 1. Functional deficits secondary to C3-4 fx with central cord injury which require 3+ hours per day of interdisciplinary therapy in a comprehensive inpatient rehab setting. Physiatrist is providing close team supervision and 24 hour management of active medical problems listed below. Physiatrist and rehab team continue to assess barriers to discharge/monitor patient progress toward functional and medical goals. FIM: FIM - Bathing Bathing: 1: Total-Patient completes 0-2 of 10 parts or less than 25%  FIM - Upper Body Dressing/Undressing Upper body dressing/undressing: 0: Wears gown/pajamas-no public clothing FIM - Lower Body Dressing/Undressing Lower body dressing/undressing: 0: Wears gown/pajamas-no public clothing  FIM - Toileting Toileting: 0: Activity did not occur  FIM - Diplomatic Services operational officer Devices: Psychiatrist Transfers: 0-Activity did not occur  FIM - Banker Devices: HOB elevated Bed/Chair Transfer: 2: Supine > Sit: Max A (lifting assist/Pt. 25-49%)  FIM - Locomotion: Wheelchair Locomotion: Wheelchair: 0: Activity did not occur FIM - Locomotion: Ambulation Ambulation/Gait Assistance: 3: Mod assist Locomotion: Ambulation: 0: Activity did not occur  Comprehension Comprehension Mode: Auditory Comprehension: 3-Understands basic 50 - 74% of the time/requires cueing 25 - 50%  of the time  Expression Expression Mode: Verbal Expression: 4-Expresses basic 75 - 89% of the time/requires cueing 10 - 24% of  the time. Needs helper to occlude trach/needs to repeat words.  Social Interaction Social Interaction: 3-Interacts appropriately 50 - 74% of the time - May be physically or verbally inappropriate.  Problem  Solving Problem Solving: 2-Solves basic 25 - 49% of the time - needs direction more than half the time to initiate, plan or complete simple activities  Memory Memory: 2-Recognizes or recalls 25 - 49% of the time/requires cueing 51 - 75% of the time  Medical Problem List and Plan:  1. 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 DVT Prophylaxis/Anticoagulation: SCDs. Monitor for any signs of DVT  3. Pain Management: Tylenol and robaxin prn  4. Mood/delirium/acute encephalopathy.still some sundowning and hs confusion   -admittedly drank "2-3 beers at night" PTA.  -?old CVA on head CT. I suspect he suffered a head trauma here as well. 5. Neuropsych: This patient is not capable of making decisions on his/her own behalf.  6. Dysphagia. Status post gastrostomy tube placement 10/01/2012 per Dr. Lindie Spruce.    -add another h20 flush through g tube 7. Aspiration pneumonia/ respiratory failure. -see below 8. Hypothyroidism. Synthroid  9. Diabetes mellitus. Check blood sugars every 4 hours while on tube feeds. lantus insulin 10 units q12--increase to 20 units . Patient was on tradgenta 5 mg daily prior to admission 10. Leukocytosis: aspiration pneumonia  -vanc and merem per ccm recs, respiratory culture with serratia--we can likely narrow abx coverage to account for this- discuss with CCM  -adjusted TF, keep HOB. Will need to go home on continuous  -IS, FV, OOB routinely with therapy  -afebrile, sats are stable, still with cough and secretions  -cxr shows improvement, his voice is stronger  LOS (Days) 10 A FACE TO FACE EVALUATION WAS PERFORMED  SWARTZ,ZACHARY T 10/13/2012, 7:51 AM

## 2012-10-13 NOTE — Progress Notes (Signed)
Inpatient Diabetes Program Recommendations  AACE/ADA: New Consensus Statement on Inpatient Glycemic Control (2013)  Target Ranges:  Prepandial:   less than 140 mg/dL      Peak postprandial:   less than 180 mg/dL (1-2 hours)      Critically ill patients:  140 - 180 mg/dL   Reason for Visit: Hyperglycemia Noted glucose at 1600 to be normal at 123 mg/dL.  However, it is at this time that the tube feed is on hold until tube placement is verified. Once tube feedings are restarted, please consider the following: Inpatient Diabetes Program Recommendations Insulin - Meal Coverage: Tube feed coverage.  Please add 4 units q 4hrs to cover Osmolite at 63ml/hr in addition to sensitive correction q 4 hrs.  Note: Thank you, Lenor Coffin, RN, CNS, Diabetes Coordinator [phone])

## 2012-10-13 NOTE — Progress Notes (Signed)
Physical Therapy Session Note  Patient Details  Name: [name] MRN: [id number] Date of Birth: 23-May-1931  Today's Date: 10/13/2012 Time: 4696-2952 Time Calculation (min): 30 min  Short Term Goals: Week 2:  PT Short Term Goal 1 (Week 2): Pt will tolerate OOB x 1 hr a day at least PT Short Term Goal 2 (Week 2): Pt will gait with mod A x 15' for functional mobility PT Short Term Goal 3 (Week 2): Pt will complete supine to sit with mod A for improved bed mobility PT Short Term Goal 4 (Week 2): Pt will transfer 3/4 trials with mod A s  Skilled Therapeutic Interventions/Progress Updates:    Co treat with Speech therapy with focus on participation in functional tasks, self-care and use of bilateral UEs at sink, functional mobility in the room and with toileting tasks, dynamic standing balance, gait with RW and overall activity tolerance to increase lung function and promote mobility/OOB. Pt educated on reasoning for NPO status and verbalized understanding of need for mobility to increase lung function and ultimately have a chance at progressing diet. Pt required overall min A initially and up to mod A by end of session due to fatigue; second person was needed to assist with IV poles and O2 tank. This was the best participation and progression demonstrated in a session.   Therapy Documentation Precautions:  Precautions Precautions: Fall Cervical Brace: Hard collar Restrictions Weight Bearing Restrictions: No   Pain: No complaints of pain, only fatigue.   Locomotion : Ambulation Ambulation/Gait Assistance: 1: +2 Total assist;3: Mod assist   See FIM for current functional status  Therapy/Group: Individual Therapy and Co-Treatment with Speech Therapy  Karolee Stamps [facility] 10/13/2012, 10:46 AM

## 2012-10-13 NOTE — Progress Notes (Signed)
Speech Language Pathology Weekly Progress Note  Patient Details  Name: [name] MRN: [id number] Date of Birth: [birth year]  Today's Date: 10/13/2012  Short Term Goals: Week 1: SLP Short Term Goal 1 (Week 1): Pt will demonstrate improved management of secretions with mod assist to expectorate/use oral suctioning. SLP Short Term Goal 1 - Progress (Week 1): Met SLP Short Term Goal 2 (Week 1): Pt will demonstrate use of laryngeal closure techniques (eg. supraglottic swallow, glottal closure) in an effort to assist with airway protection during functional swallowing with max assist.  SLP Short Term Goal 2 - Progress (Week 1): Not met SLP Short Term Goal 3 (Week 1): Pt will sustain attention to a functional task for ~5 minutes with Max A verbal cues.  SLP Short Term Goal 3 - Progress (Week 1): Met SLP Short Term Goal 4 (Week 1): Pt will demonstrate functional problem solving for functional and familiar tasks with Max A verbal and question cues.  SLP Short Term Goal 4 - Progress (Week 1): Met  New Short Term Goals Week 2: SLP Short Term Goal 1 (Week 2): Pt will demonstrate functional problem solving for functional tasks with Mod A SLP Short Term Goal 2 (Week 2): Pt will demonstrate selective attention to task for 10 minutes with Mod A verbal cues for redirection SLP Short Term Goal 3 (Week 2): Pt will increased vocal intensity to increase overall speech intelligibility with Min A verbal cues.  SLP Short Term Goal 4 (Week 2): Pt will demonstrate use of laryngeal closure techniques (eg. supraglottic swallow, glottal closure) in an effort to assist with airway protection during functional swallowing with max assist  Weekly Progress Updates: Pt has made functional gains and has met 3 out of 4 STG's this reporting period. Currently, pt continues to require NPO status and was not tolerating trials of ice chips trials and required Max-Total A for executions of glottis closure exercises. Pt is also  overall Mod-Max A for functional problem solving, working memory, emergent awareness, reasoning, and attention. Pt's functional gains have been slow due to decreased participation in therapy. Pt would benefit from continued skilled SLP intervention to maximize swallow function and overall cognitive function to decrease caregiver burden.    SLP Frequency: 1-2 X/day, 30-60 minutes Estimated Length of Stay: 2 weeks SLP Treatment/Interventions: Cognitive remediation/compensation;Cueing hierarchy;Functional tasks;Environmental controls;Dysphagia/aspiration precaution training;Therapeutic Activities;Patient/family education;Internal/external aids  Daily Session FIM:  Comprehension Comprehension Mode: Auditory Comprehension: 4-Understands basic 75 - 89% of the time/requires cueing 10 - 24% of the time Expression Expression Mode: Verbal Expression: 4-Expresses basic 75 - 89% of the time/requires cueing 10 - 24% of the time. Needs helper to occlude trach/needs to repeat words. Social Interaction Social Interaction: 4-Interacts appropriately 75 - 89% of the time - Needs redirection for appropriate language or to initiate interaction. Problem Solving Problem Solving: 2-Solves basic 25 - 49% of the time - needs direction more than half the time to initiate, plan or complete simple activities Memory Memory: 3-Recognizes or recalls 50 - 74% of the time/requires cueing 25 - 49% of the time FIM - Eating Eating Activity: 1: Helper performs IV, parenteral, or tube feeding  [name] 10/13/2012, 12:15 PM

## 2012-10-13 NOTE — Progress Notes (Signed)
Occupational Therapy Note  Patient Details  Name: SUBHAAN ESTEP MRN: [id number] Date of Birth: Jan 08, 1931 Today's Date: 10/13/2012  Patient missed 30 minutes of skilled occupational therapy secondary to patient off unit.    Tesa Meadors 10/13/2012, 3:07 PM

## 2012-10-13 NOTE — Progress Notes (Signed)
Occupational Therapy Session Note  Patient Details  Name: [name] MRN: [id number] Date of Birth: [birth year]  Today's Date: 10/13/2012 Time: 1103-1200 Time Calculation (min): 57 min  Short Term Goals: Week 2:  OT Short Term Goal 1 (Week 2): Patient will tolerate being OOB, sitting in w/c or recliner, for at least an hour OT Short Term Goal 2 (Week 2): Patient will attempt to wash face using modified wash cloth, if needed, with set-up assistance OT Short Term Goal 3 (Week 2): Therapist will complete PROM exercises to bilateral shoulders, elbows, forearms, wrist, and fingers up to patients tolerance level at least 4times a week in order to stretch muscles and help with strenthening weak UEs OT Short Term Goal 4 (Week 2): Patient will perform [facility] transfer with min assist  Skilled Therapeutic Interventions/Progress Updates:    Performed bathing sit to stand at EOB.  Pt frequently needing max encouragement to participate in therapy.  Continually requesting to lay down in the bed.  Needed mod assist to wash his arms, max assist to wash peri area, and total assist to wash his feet.  Gave him mod assist for donning brief sitting EOB as well.  Did not attempt donning clothing secondary to pt having leaking fluid coming out of his PEG tube.  Nursing aware and PA also made aware.  Returned to supine after bathing.  Performed 1 set of 8 repetitions for shoulder flexion and elbow extension bilaterally.  Pt needs min facilitation for shoulder flexion on both sides with right side needing slightly less assist than left.  Able to perform bilateral elbow extension with min assist on the left and supervision on the right, and therapist stabilizing shoulder.  Issued green therapy putty and had pt work on 5-6 repetitions of gross digit flexion bilaterally as well.    Therapy Documentation Precautions:  Precautions Precautions: Fall;Cervical Cervical Brace: Hard collar Restrictions Weight Bearing  Restrictions: No  Vital Signs: Therapy Vitals Patient Position, if appropriate: Sitting Oxygen Therapy SpO2: 92 % O2 Device: Nasal cannula O2 Flow Rate (L/min): 3 L/min Pulse Oximetry Type: Intermittent Pain: Pain Assessment Pain Assessment: No/denies pain ADL: See FIM for current functional status  Therapy/Group: Individual Therapy  Xiomara Sevillano OTR/L 10/13/2012, 12:35 PM

## 2012-10-13 NOTE — Progress Notes (Addendum)
Speech Language Pathology Daily Session Note  Patient Details  Name: [name] MRN: [id number] Date of Birth: 1931-03-06  Today's Date: 10/13/2012 Time: 7829-5621 Time Calculation (min): 30 min  Short Term Goals: Week 1: SLP Short Term Goal 1 (Week 1): Pt will demonstrate improved management of secretions with mod assist to expectorate/use oral suctioning. SLP Short Term Goal 1 - Progress (Week 1): Progressing toward goal SLP Short Term Goal 2 (Week 1): Pt will demonstrate use of laryngeal closure techniques (eg. supraglottic swallow, glottal closure) in an effort to assist with airway protection during functional swallowing with max assist.  SLP Short Term Goal 2 - Progress (Week 1): Not progressing SLP Short Term Goal 3 (Week 1): Pt will sustain attention to a functional task for ~5 minutes with Max A verbal cues.  SLP Short Term Goal 3 - Progress (Week 1): Progressing toward goal SLP Short Term Goal 4 (Week 1): Pt will demonstrate functional problem solving for functional and familiar tasks with Max A verbal and question cues.  SLP Short Term Goal 4 - Progress (Week 1): Progressing toward goal  Skilled Therapeutic Interventions: Co-treatment with PT with focus on participation in functional tasks, and functional problem solving with grooming tasks. Pt educated on reasoning for NPO status and verbalized understanding of need for mobility to increase lung function and ultimately have a chance at progressing diet. Pt required Max verbal and hand over hand assist for participation in grooming tasks (brushing hair, washing face). Pt participated in functional mobility tasks with PT with Mod verbal cues for participation. This was the best participation and progression demonstrated in a session.   FIM:  Comprehension Comprehension Mode: Auditory Comprehension: 4-Understands basic 75 - 89% of the time/requires cueing 10 - 24% of the time Expression Expression Mode: Verbal Expression:  4-Expresses basic 75 - 89% of the time/requires cueing 10 - 24% of the time. Needs helper to occlude trach/needs to repeat words. Social Interaction Social Interaction: 4-Interacts appropriately 75 - 89% of the time - Needs redirection for appropriate language or to initiate interaction. Problem Solving Problem Solving: 2-Solves basic 25 - 49% of the time - needs direction more than half the time to initiate, plan or complete simple activities Memory Memory: 3-Recognizes or recalls 50 - 74% of the time/requires cueing 25 - 49% of the time FIM - Eating Eating Activity: 1: Helper performs IV, parenteral, or tube feeding  Pain Intermittently throughout the session. Pt repositioned   Therapy/Group: Co-treatment   [name] 10/13/2012, 12:08 PM

## 2012-10-13 NOTE — Progress Notes (Signed)
Late entry: At 10am during administration of tube feeding, noticed redness to skin around peg site.  Dressing 100% saturated with visible amount of tube feeding on gauze and around tube site. Pt reports tenderness to area. Tube feeding patent, with 3ml of residual. Dressing changed. Administered of water flush per order, along with scheduled medication.   After pt requested to be returned to room, and was transferred from w/c to bed, pt coughed. After coughing episode, note a  large amount of clear, tan drainage leaking from left lower side of peg site. Drainage completely saturated gauze, brief and L side of pt's gown. Peg site assessed, and notice yellow, sticky drainage on top upper portion of tubing. Harvel Ricks, PA made aware. Dan contacted surgical area, and stated that this area would sent their PA to assess. No new orders at this time.
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
## 2012-10-14 ENCOUNTER — Inpatient Hospital Stay (HOSPITAL_COMMUNITY): Payer: PRIVATE HEALTH INSURANCE

## 2012-10-14 ENCOUNTER — Inpatient Hospital Stay (HOSPITAL_COMMUNITY): Payer: PRIVATE HEALTH INSURANCE | Admitting: Occupational Therapy

## 2012-10-14 ENCOUNTER — Inpatient Hospital Stay (HOSPITAL_COMMUNITY): Payer: PRIVATE HEALTH INSURANCE | Admitting: Speech Pathology

## 2012-10-14 ENCOUNTER — Other Ambulatory Visit (HOSPITAL_COMMUNITY): Payer: PRIVATE HEALTH INSURANCE

## 2012-10-14 DIAGNOSIS — J690 Pneumonitis due to inhalation of food and vomit: Secondary | ICD-10-CM

## 2012-10-14 DIAGNOSIS — R1310 Dysphagia, unspecified: Secondary | ICD-10-CM

## 2012-10-14 DIAGNOSIS — S069X9A Unspecified intracranial injury with loss of consciousness of unspecified duration, initial encounter: Secondary | ICD-10-CM

## 2012-10-14 DIAGNOSIS — IMO0002 Reserved for concepts with insufficient information to code with codable children: Secondary | ICD-10-CM

## 2012-10-14 DIAGNOSIS — Z5189 Encounter for other specified aftercare: Secondary | ICD-10-CM

## 2012-10-14 LAB — GLUCOSE, CAPILLARY
Glucose-Capillary: 122 mg/dL — ABNORMAL HIGH (ref 70–99)
Glucose-Capillary: 73 mg/dL (ref 70–99)
Glucose-Capillary: 63 mg/dL — ABNORMAL LOW (ref 70–99)
Glucose-Capillary: 63 mg/dL — ABNORMAL LOW (ref 70–99)
Glucose-Capillary: 142 mg/dL — ABNORMAL HIGH (ref 70–99)

## 2012-10-14 LAB — TSH: TSH: 2.659 u[IU]/mL (ref 0.350–4.500)

## 2012-10-14 MED ORDER — DEXTROSE 50 % IV SOLN
25.0000 mL | Freq: Once | INTRAVENOUS | Status: AC
  Administered 2012-10-14: 25 mL via INTRAVENOUS
  Filled 2012-10-14: qty 50

## 2012-10-14 MED ORDER — ALBUTEROL SULFATE (5 MG/ML) 0.5% IN NEBU
2.5000 mg | INHALATION_SOLUTION | Freq: Three times a day (TID) | RESPIRATORY_TRACT | Status: DC
  Administered 2012-10-15 – 2012-10-18 (×6): 2.5 mg via RESPIRATORY_TRACT
  Filled 2012-10-14 (×9): qty 0.5

## 2012-10-14 MED ORDER — IOHEXOL 300 MG/ML  SOLN
50.0000 mL | Freq: Once | INTRAMUSCULAR | Status: AC | PRN
  Administered 2012-10-14: 20 mL via INTRAVENOUS

## 2012-10-14 MED ORDER — DEXTROSE 50 % IV SOLN
25.0000 mL | Freq: Once | INTRAVENOUS | Status: AC | PRN
  Administered 2012-10-14: 25 mL via INTRAVENOUS

## 2012-10-14 MED ORDER — DEXTROSE 50 % IV SOLN
INTRAVENOUS | Status: AC
  Administered 2012-10-14: 25 mL via INTRAVENOUS
  Filled 2012-10-14: qty 50

## 2012-10-14 MED ORDER — IPRATROPIUM BROMIDE 0.02 % IN SOLN
0.5000 mg | Freq: Three times a day (TID) | RESPIRATORY_TRACT | Status: DC
  Administered 2012-10-15 – 2012-10-18 (×6): 0.5 mg via RESPIRATORY_TRACT
  Filled 2012-10-14 (×9): qty 2.5

## 2012-10-14 MED ORDER — OSMOLITE 1.5 CAL PO LIQD
1000.0000 mL | ORAL | Status: DC
  Administered 2012-10-14: 1000 mL
  Filled 2012-10-14 (×5): qty 1000

## 2012-10-14 NOTE — Progress Notes (Signed)
Physical Therapy Note  Patient Details  Name: [name] MRN: [id number] Date of Birth: 1931-11-03 Today's Date: 10/14/2012  Attempted to see pt again to make up 15 minutes of skilled PT missed from earlier this AM, but pt refused. Stated he did not feel up to getting out of bed and declined therex or gait in the room. Asked to be left to rest. Call bell and suction in reach. Planning to go down for procedure this afternoon for PEG placement.   Karolee Stamps [facility] 10/14/2012, 2:36 PM

## 2012-10-14 NOTE — Progress Notes (Signed)
Consent form signed and verbal consent obtained via telephone by patient daughter, DPOA and witnessed by charge RN. Daughter verbalized understanding and consented to procedure.

## 2012-10-14 NOTE — Progress Notes (Signed)
At around 0930, PEG tube was accidentally pulled out. Placed 16 Fr foley catheter tube  Inserted in the hole. There is drainage from the opening. Trauma PA was paged around 0940, orders received.4x4 gauze placed around site and taped. Patient aware and verbalizes understanding. Daughter called around [phone], informed of situation and verbalized understanding.

## 2012-10-14 NOTE — Progress Notes (Signed)
Speech Language Pathology Daily Session Note  Patient Details  Name: [name] MRN: [id number] Date of Birth: 08-Aug-1931  Today's Date: 10/14/2012 Time: 1308-6578 Time Calculation (min): 40 min  Short Term Goals: Week 2: SLP Short Term Goal 1 (Week 2): Pt will demonstrate functional problem solving for functional tasks with Mod A SLP Short Term Goal 2 (Week 2): Pt will demonstrate selective attention to task for 10 minutes with Mod A verbal cues for redirection SLP Short Term Goal 3 (Week 2): Pt will increased vocal intensity to increase overall speech intelligibility with Min A verbal cues.  SLP Short Term Goal 4 (Week 2): Pt will demonstrate use of laryngeal closure techniques (eg. supraglottic swallow, glottal closure) in an effort to assist with airway protection during functional swallowing with max assist  Skilled Therapeutic Interventions: Treatment focus on dysphagia goals. Pt participated in thermal stimulation and initiated an effortful swallow X 3 attempts with ~3-4 second delay. Pt also participated in laryngeal elevation exercises with Mod A demonstration and verbal cues and glottic closure exercises with total A.  Pt perseverative on sacral pain and required Max encouragement for participation.    FIM:  Comprehension Comprehension Mode: Auditory Comprehension: 4-Understands basic 75 - 89% of the time/requires cueing 10 - 24% of the time Expression Expression Mode: Verbal Expression: 4-Expresses basic 75 - 89% of the time/requires cueing 10 - 24% of the time. Needs helper to occlude trach/needs to repeat words. Social Interaction Social Interaction: 4-Interacts appropriately 75 - 89% of the time - Needs redirection for appropriate language or to initiate interaction. Problem Solving Problem Solving: 2-Solves basic 25 - 49% of the time - needs direction more than half the time to initiate, plan or complete simple activities Memory Memory: 3-Recognizes or recalls 50 -  74% of the time/requires cueing 25 - 49% of the time  Pain Pain Assessment Pain Assessment: No/denies pain  Therapy/Group: Individual Therapy  Nocole Zammit 10/14/2012, 3:20 PM
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
## 2012-10-14 NOTE — Significant Event (Signed)
Hypoglycemic Event  CBG:63  Treatment: D50 IV 25 mL @1620   Symptoms: None  Follow-up CBG: Time:1635 CBG Result:95  Possible Reasons for Event: Other: Patient NPO and PEG tube was pulled out at 0930, Scheduled Lantus was given@0909  prior to PEG tube coming out  Comments/MD notified:Dr. Love notified   Deonna Krummel, Phill Mutter  Remember to initiate Hypoglycemia Order Set & complete

## 2012-10-14 NOTE — Progress Notes (Signed)
Patient ID: [name], male   DOB: 1931/05/11, 78 y.o.   MRN: [id number]    Subjective: Called to look at patient's PEG tube yesterday due to TFs draining around tube.  Patient without complaints.  TFs started again  Objective: Vital signs in last 24 hours: Temp:  [97.4 F (36.3 C)-97.5 F (36.4 C)] 97.4 F (36.3 C) (10/15 0500) Pulse Rate:  [60-95] 95  (10/15 0837) Resp:  [20] 20  (10/15 0837) BP: (111-137)/(64-73) 111/64 mmHg (10/15 0500) SpO2:  [92 %-100 %] 98 % (10/15 0837) Weight:  [128 lb 15.5 oz (58.5 kg)] 128 lb 15.5 oz (58.5 kg) (10/15 0500) Last BM Date: 10/13/12  Intake/Output from previous day: 10/14 0701 - 10/15 0700 In: -  Out: 2050 [Urine:2050] Intake/Output this shift: Total I/O In: -  Out: 250 [Urine:250]  PE: Abd: soft, PEG tube readjusted  Lab Results:  No results found for this basename: WBC:2,HGB:2,HCT:2,PLT:2 in the last 72 hours BMET No results found for this basename: NA:2,K:2,CL:2,CO2:2,GLUCOSE:2,BUN:2,CREATININE:2,CALCIUM:2 in the last 72 hours PT/INR No results found for this basename: LABPROT:2,INR:2 in the last 72 hours CMP     Component Value Date/Time   NA 139 10/11/2012 0620   K 4.2 10/11/2012 0620   CL 99 10/11/2012 0620   CO2 30 10/11/2012 0620   GLUCOSE 271* 10/11/2012 0620   BUN 27* 10/11/2012 0620   CREATININE 0.59 10/11/2012 0620   CALCIUM 9.1 10/11/2012 0620   PROT 6.4 10/06/2012 0545   ALBUMIN 2.0* 10/06/2012 0545   AST 97* 10/06/2012 0545   ALT 50 10/06/2012 0545   ALKPHOS 159* 10/06/2012 0545   BILITOT 0.7 10/06/2012 0545   GFRNONAA >90 10/11/2012 0620   GFRAA >90 10/11/2012 0620   Lipase     Component Value Date/Time   LIPASE 33 11/23/2010 0605       Studies/Results: Dg Abd 1 View  10/13/2012  *RADIOLOGY REPORT*  Clinical Data: Peg tube reinsertion.  ABDOMEN - 1 VIEW  Comparison: None.  Findings: Contrast has been injected through the peg tube and the injected contrast is all within the stomach and duodenal  bulb.  There is faint contrast throughout the nondistended colon.  No small bowel dilatation.  Numerous surgical clips in the right upper quadrant.  IMPRESSION: Peg tube appears in good position.   Original Report Authenticated By: Gwynn Burly, M.D.     Anti-infectives: Anti-infectives     Start     Dose/Rate Route Frequency Ordered Stop   10/14/12 1000   cefTRIAXone (ROCEPHIN) 2 g in dextrose 5 % 50 mL IVPB        2 g 100 mL/hr over 30 Minutes Intravenous Every 24 hours 10/13/12 1638 10/16/12 0959   10/13/12 1015   cefTRIAXone (ROCEPHIN) 2 g in dextrose 5 % 50 mL IVPB  Status:  Discontinued        2 g 100 mL/hr over 30 Minutes Intravenous Every 24 hours 10/13/12 1008 10/13/12 1638   10/11/12 0800   vancomycin (VANCOCIN) 750 mg in sodium chloride 0.9 % 150 mL IVPB  Status:  Discontinued        750 mg 150 mL/hr over 60 Minutes Intravenous Every 12 hours 10/10/12 2215 10/13/12 1008   10/10/12 0730   meropenem (MERREM) 1 g in sodium chloride 0.9 % 100 mL IVPB  Status:  Discontinued        1 g 200 mL/hr over 30 Minutes Intravenous 3 times per day 10/10/12 0717 10/13/12 1008   10/07/12 1500  vancomycin (VANCOCIN) 1,250 mg in sodium chloride 0.9 % 250 mL IVPB  Status:  Discontinued        1,250 mg 166.7 mL/hr over 90 Minutes Intravenous Every 24 hours 10/07/12 1350 10/10/12 2214   10/07/12 1000   piperacillin-tazobactam (ZOSYN) IVPB 3.375 g  Status:  Discontinued        3.375 g 12.5 mL/hr over 240 Minutes Intravenous 3 times per day 10/07/12 0924 10/10/12 0708           Assessment/Plan  1.  PEG Tube leaking: films confirmed tube in proper position, tube tightened, continue TFs and monitor   LOS: 11 days    Tryton Bodi 10/14/2012

## 2012-10-14 NOTE — Procedures (Signed)
Successful replacement of gastrostomy tube.  Placed 26 French balloon retention catheter.  Position confirmed with fluoroscopy.  No immediate complication.  Tube is ready to be used.
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
## 2012-10-14 NOTE — Progress Notes (Signed)
Patient ID: [name], male   DOB: [birth year], 78 y.o.   MRN: [id number] Called by nursing staff, g-tube accidentally pulled out.  Foley inserted.  Continue dressing changes for now and will have IR replace today.  WHITE, ELIZABETH  9:49 AM 10/14/2012

## 2012-10-14 NOTE — Progress Notes (Signed)
Hypoglycemic Event  CBG: 63  Treatment: D50 IV 25 mL  Symptoms: None  Follow-up CBG: Time 2153* CBG Result 142  Possible Reasons for Event: Inadequate meal intake  Comments/MD notified [name]  Remember to initiate Hypoglycemia Order Set & complete

## 2012-10-14 NOTE — Progress Notes (Signed)
Physical Therapy Session Note  Patient Details  Name: [name] MRN: [id number] Date of Birth: [birth year]  Today's Date: 10/14/2012 Time: 4098-1191 Time Calculation (min): 45 min  Short Term Goals: Week 2:  PT Short Term Goal 1 (Week 2): Pt will tolerate OOB x 1 hr a day at least PT Short Term Goal 2 (Week 2): Pt will gait with mod A x 15' for functional mobility PT Short Term Goal 3 (Week 2): Pt will complete supine to sit with mod A for improved bed mobility PT Short Term Goal 4 (Week 2): Pt will transfer 3/4 trials with mod A s    Reports having a terrible night and getting no rest. Respiratory therapist starting breathing treatment, pt agreeable to complete LE therex in supine during breathing treatment. Completed heel slides, hip abduction, and ankle pumps x 15 reps x 2 sets each LE. Agreeable to OOB and therapy only in his room. Supine to sit min A and gait with RW with steady A in the room. Sit to stands x 2 with close S with RW for standing tolerance and functional transfers but then pt feeling nauseous and refused rest of therapy. Agreeable for trying therapy later this afternoon to make up missed time. RN in with pt at end of session.   Therapy Documentation Precautions:  Precautions Precautions: Fall;Cervical Cervical Brace: Hard collar Restrictions Weight Bearing Restrictions: No General: Amount of Missed PT Time (min): 15 Minutes Missed Time Reason: Patient ill (comment);Patient unwilling/refused to participate without medical reason (see note)  Pain:  Denies pain.  Locomotion : Ambulation Ambulation/Gait Assistance: 4: Min guard   See FIM for current functional status  Therapy/Group: Individual Therapy  Karolee Stamps [facility] 10/14/2012, 9:24 AM

## 2012-10-14 NOTE — Progress Notes (Signed)
Pulled G tube up 1 cm and tightened flange.

## 2012-10-14 NOTE — Progress Notes (Signed)
Replace G tube, study with contrast eval.

## 2012-10-14 NOTE — Progress Notes (Signed)
Patient transferred to radiology at 1640, in stable condition.

## 2012-10-14 NOTE — Progress Notes (Signed)
Occupational Therapy Session Note  Patient Details  Name: HANNON CERA MRN: [id number] Date of Birth: [birth year]  Today's Date: 10/14/2012  1100-1110 - 10 Minutes 1140-1200 - 20 Minutes Amount of Missed OT Time (min): 30 Minutes Missed Time Reason: Patient ill (comment);Patient unwilling/refused to participate without medical reason (see note)  Short Term Goals: Week 1:  OT Short Term Goal 1 (Week 1): Pt will sit EOB to don shirt with steady A OT Short Term Goal 1 - Progress (Week 1): Not met OT Short Term Goal 2 (Week 1): Pt will don shirt with mod A OT Short Term Goal 2 - Progress (Week 1): Not met OT Short Term Goal 3 (Week 1): Pt will transfer to toilet / BSC with mod A OT Short Term Goal 3 - Progress (Week 1): Met OT Short Term Goal 4 (Week 1): Pt and therapist will explore options for donning LB clothing with AE/ apaptive methods OT Short Term Goal 4 - Progress (Week 1): Not met OT Short Term Goal 5 (Week 1): Pt will be able to pull down pants for toileting with steady A OT Short Term Goal 5 - Progress (Week 1): Not met  Week 2:  OT Short Term Goal 1 (Week 2): Patient will tolerate being OOB, sitting in w/c or recliner, for at least an hour OT Short Term Goal 2 (Week 2): Patient will attempt to wash face using modified wash cloth, if needed, with set-up assistance OT Short Term Goal 3 (Week 2): Therapist will complete PROM exercises to bilateral shoulders, elbows, forearms, wrist, and fingers up to patients tolerance level at least 4times a week in order to stretch muscles and help with strenthening weak UEs OT Short Term Goal 4 (Week 2): Patient will perform [facility] transfer with min assist  Skilled Therapeutic Interventions/Progress Updates:  No clothes available for BADL of dressing.  Patient found supine in bed. Patient initially willing to do some arm exercises, but after asked to be left alone so he could "rest". Therapist compromised with patient and patient willing to get  OOB 30 minutes later. Therapist arrived at 1140 and patient willing to get OOB. Patient engaged in bed mobility and sat edge of bed with minimal assistance. Patient then stood using rolling walker with min assist and transferred to recliner. Therapist then encouraged patient to perform grooming tasks of washing face and combing hair while also working on forward leaning seated in chair using trunk and bilateral UEs. At end of session left patient seated in recliner with call bell and suction within reach.   Precautions:  Precautions Precautions: Fall;Cervical Cervical Brace: Hard collar Restrictions Weight Bearing Restrictions: No  See FIM for current functional status  Therapy/Group: Individual Therapy  Tiburcio Linder 10/14/2012, 12:08 PM

## 2012-10-15 ENCOUNTER — Encounter (HOSPITAL_COMMUNITY): Payer: PRIVATE HEALTH INSURANCE | Admitting: Occupational Therapy

## 2012-10-15 ENCOUNTER — Inpatient Hospital Stay (HOSPITAL_COMMUNITY): Payer: PRIVATE HEALTH INSURANCE | Admitting: Speech Pathology

## 2012-10-15 ENCOUNTER — Inpatient Hospital Stay (HOSPITAL_COMMUNITY): Payer: PRIVATE HEALTH INSURANCE

## 2012-10-15 DIAGNOSIS — K9420 Gastrostomy complication, unspecified: Secondary | ICD-10-CM

## 2012-10-15 LAB — GLUCOSE, CAPILLARY
Glucose-Capillary: 117 mg/dL — ABNORMAL HIGH (ref 70–99)
Glucose-Capillary: 84 mg/dL (ref 70–99)
Glucose-Capillary: 86 mg/dL (ref 70–99)
Glucose-Capillary: 50 mg/dL — ABNORMAL LOW (ref 70–99)
Glucose-Capillary: 87 mg/dL (ref 70–99)

## 2012-10-15 MED ORDER — DEXTROSE-NACL 5-0.2 % IV SOLN
INTRAVENOUS | Status: DC
  Administered 2012-10-15 – 2012-10-16 (×2): via INTRAVENOUS

## 2012-10-15 MED ORDER — SODIUM CHLORIDE 0.45 % IV SOLN
INTRAVENOUS | Status: DC
  Administered 2012-10-15: 15:00:00 via INTRAVENOUS

## 2012-10-15 MED ORDER — DEXTROSE 50 % IV SOLN: 25.0000 mL | Freq: Once | INTRAVENOUS | Status: AC | PRN

## 2012-10-15 MED ORDER — DEXTROSE 50 % IV SOLN
INTRAVENOUS | Status: AC
  Administered 2012-10-15: 25 mL
  Filled 2012-10-15: qty 50

## 2012-10-15 NOTE — Consult Note (Signed)
  10/14/12  PSYCHOSOCIAL NOTE - CONFIDENTIAL  [name]   [name] was seen on the [name] during his speech language therapy to assist in determining barriers to patient participation, assist with coping strategies and to assess whether any cognitive issues are playing a role.   Subjective: [name] described his current mood as "okay." He complained of pain and feeling uncomfortable. He admitted that he feels unmotivated to perform speech exercises in the interim between therapies. He said that he only wants to lay flat in his bed owing to issues with chronic pain.   Objective: [name] appeared very uncomfortable. He was constantly trying to rearrange himself in his bed but was doing so unsuccessfully. He has a great sense of humor which is likely a good defense mechanism that he utilizes. However, he also has a tendency to embellish his symptoms to serve as a plea for assistance. For example, when asked his pain level on a scale of 1-10, he said, "11." Upon query later on, he admitted that his pain was not really that high and it was only "bothering" him.    Assessment: [name] is a very friendly individual who lacks the motivation and likely the energy to fully commit to rehab at this time. However, he seems to be compliant with therapist's demands during their scheduled sessions. Physical limitations and fatigue are also likely adversely impacting his motivation level and mood.   Plan:    Continue to encourage the patient to practice his exercises in the time between scheduled therapies while emphasizing that the more he practices, the sooner he will likely be discharged.    Keep in mind that while he is not blatantly suffering from severe cognitive issues or dementia, given his medical history, he is feasibly suffering from at least a mild degree of cognitive impairment that could be making it hard for him to remember all of his  exercises. As such, reminders throughout the day could be beneficial.    If deemed appropriate, despite the presence of significant depression, consider implementing an antidepressant with the side effect of increase energy such as fluoxetine. Or implementing something such as Cymbalta could help with pain. These may help motivate him to more fully participate with therapy. A low dose psychostimulant may also be helpful, if not medically contraindicated.   Greater than 50% of this visit was spent educating the patient about the possible diagnosis, prognosis, management plan, and in coordination of care.   REFERRING DIAGNOSIS: C3-C4 fracture status post fusion with central cord syndrome  FINAL DIAGNOSIS:  C3-C4 fracture status post fusion with central cord syndrome Adjustment disorder (with mild stress and frustration)   Total professional time including medical record review and feedback equals 4 units (11914).   Debbe Mounts, Psy.D.  Clinical Neuropsychologist

## 2012-10-15 NOTE — Significant Event (Signed)
Hypoglycemic Event  CBG:50  Treatment: D50 IV 25 mL  Symptoms: None  Follow-up CBG: Time:2135 CBG Result:87  Possible Reasons for Event: Other: NPO and holding tube feeds per MD order  Comments/MD notified:Dan A.,PA notified given order for D 50 25 ml given IV push     [place], [name]  Remember to initiate Hypoglycemia Order Set & complete

## 2012-10-15 NOTE — Progress Notes (Signed)
Social Work Patient ID: Quintin Alto, male   DOB: [birth year], 78 y.o.   MRN: [id number]  Have reviewed team conference information with patient, daughter Marcelino Duster) and caregiver [name]) - all aware and agreeable with continued plan for 10/25 discharge.  [name] and daughter both confirm plan is for pt to return home with [name] and [name] to provide 24/7 assistance.  Daughter reports she lives approximately 15 mins away from pt and plans to go to his home daily and "... As much as I need to outside of my work hours...".  Have explained to both that it is anticipated that pt will need to d/c home with peg, therefore, need to educate [name] and daughter on how to administer tube feeds.  [name] reports that she is familiar with this already (she was a caregiver for her sister who required tube feeds).   Have scheduled education with [name] for this Fri from 9-12 [name] aware that pt has been resistant to OT sessions and she feels she may be able to get him to participate a little more.  Daughter notes that [name] can be very commanding of pt if needed.  Will continue to follow.  Alisea Matte

## 2012-10-15 NOTE — Progress Notes (Signed)
Subjective: Pt had G-tube replaced per IR yesterday and confirmed in place.  Some leakage this AM around tube.  Per Rads they are going to hold TFs x 24 to allow it to seal.    Objective: Vital signs in last 24 hours: Temp:  [97.2 F (36.2 C)-98.2 F (36.8 C)] 98.2 F (36.8 C) (10/16 0500) Pulse Rate:  [52-78] 71  (10/16 0500) Resp:  [16-19] 19  (10/16 0500) BP: (107-152)/(54-72) 108/66 mmHg (10/16 0500) SpO2:  [94 %-99 %] 95 % (10/16 0735) Weight:  [119 lb 7.8 oz (54.2 kg)] 119 lb 7.8 oz (54.2 kg) (10/16 0500) Last BM Date: 10/13/12  Intake/Output from previous day: 10/15 0701 - 10/16 0700 In: -  Out: 1950 [Urine:1950] Intake/Output this shift:    GI: soft, non-tender; bowel sounds normal; no masses,  no organomegaly, tube in place  Lab Results:  No results found for this basename: WBC:2,HGB:2,HCT:2,PLT:2 in the last 72 hours BMET No results found for this basename: NA:2,K:2,CL:2,CO2:2,GLUCOSE:2,BUN:2,CREATININE:2,CALCIUM:2 in the last 72 hours PT/INR No results found for this basename: LABPROT:2,INR:2 in the last 72 hours ABG No results found for this basename: PHART:2,PCO2:2,PO2:2,HCO3:2 in the last 72 hours  Studies/Results: Dg Abd 1 View  10/13/2012  *RADIOLOGY REPORT*  Clinical Data: Peg tube reinsertion.  ABDOMEN - 1 VIEW  Comparison: None.  Findings: Contrast has been injected through the peg tube and the injected contrast is all within the stomach and duodenal bulb.  There is faint contrast throughout the nondistended colon.  No small bowel dilatation.  Numerous surgical clips in the right upper quadrant.  IMPRESSION: Peg tube appears in good position.   Original Report Authenticated By: Gwynn Burly, M.D.    Ir Replc Gastro/colonic Tube Percut W/fluoro  10/14/2012  *RADIOLOGY REPORT*  Clinical Data: Gastrostomy tube was dislodged and replaced with a Foley catheter.  The patient needs placement of a new gastrostomy tube.  GASTROSTOMY CATHETER REPLACEMENT   Comparison: 10/13/2012  Findings: The Foley catheter and surrounding skin were prepped and draped in a sterile fashion.  Maximal barrier sterile technique was utilized including caps, mask, sterile gowns, sterile gloves, sterile drape, hand hygiene and skin antiseptic.  Contrast was injected through the Foley catheter and demonstrated placement within the stomach.  The Foley catheter was removed over a stiff Glidewire.  A new 26-French balloon retention gastrostomy tube was placed over the wire.  The balloon was inflated with 10 ml of saline.  Contrast injection confirmed placement in the stomach. Fluoroscopic images were taken and saved for this procedure.  IMPRESSION: Successful replacement of the percutaneous gastrostomy tube with fluoroscopy.   Original Report Authenticated By: Richarda Overlie, M.D.     Anti-infectives: Anti-infectives     Start     Dose/Rate Route Frequency Ordered Stop   10/14/12 1000   cefTRIAXone (ROCEPHIN) 2 g in dextrose 5 % 50 mL IVPB        2 g 100 mL/hr over 30 Minutes Intravenous Every 24 hours 10/13/12 1638 10/16/12 0959   10/13/12 1015   cefTRIAXone (ROCEPHIN) 2 g in dextrose 5 % 50 mL IVPB  Status:  Discontinued        2 g 100 mL/hr over 30 Minutes Intravenous Every 24 hours 10/13/12 1008 10/13/12 1638   10/11/12 0800   vancomycin (VANCOCIN) 750 mg in sodium chloride 0.9 % 150 mL IVPB  Status:  Discontinued        750 mg 150 mL/hr over 60 Minutes Intravenous Every 12 hours 10/10/12 2215 10/13/12  1008   10/10/12 0730   meropenem (MERREM) 1 g in sodium chloride 0.9 % 100 mL IVPB  Status:  Discontinued        1 g 200 mL/hr over 30 Minutes Intravenous 3 times per day 10/10/12 0717 10/13/12 1008   10/07/12 1500   vancomycin (VANCOCIN) 1,250 mg in sodium chloride 0.9 % 250 mL IVPB  Status:  Discontinued        1,250 mg 166.7 mL/hr over 90 Minutes Intravenous Every 24 hours 10/07/12 1350 10/10/12 2214   10/07/12 1000   piperacillin-tazobactam (ZOSYN) IVPB 3.375 g   Status:  Discontinued        3.375 g 12.5 mL/hr over 240 Minutes Intravenous 3 times per day 10/07/12 0924 10/10/12 0708          Assessment/Plan: s/p * No surgery found *  -fastened Gtube so less gap between skin and bolster.  Should be fine to use in <24hrs Call back if needed.   LOS: 12 days    Marigene Ehlers., [facility] 10/15/2012

## 2012-10-15 NOTE — Progress Notes (Signed)
Nutrition Follow-up  Intervention:   1. Resume TF per MD discretion. Recommend goal of Osmolite 1.5 at 65 ml/hr x 20 hours with 30 ml Prostat per tube daily. 2. RD to continue to follow nutrition care plan  Assessment:   Noted pt with leaking of TF, especially when coughing. Surgery saw pt on 10/14 stating PEG tube may have been displaced, plan to hold TF and order gastrograffin injection to verify PEG placement. Noted that films confirmed tube was in proper position, tube tightened and TFs resumed. G-tube inadvertently removed yesterday morning. PEG replaced yesterday, with recommendations of holding TF for at least 24 hours, and resuming at lower rate.  Diet Order:  NPO  TF: Osmolite 1.5 at 65 ml/hr x 20 hours with 30 ml Prostat per tube daily providing 2050 kcal, 97 grams protein and 988 ml free water.   Free water: 200 ml five times daily  Total water provision is 1988 ml daily.  Meds: Scheduled Meds:    . albuterol  2.5 mg Nebulization TID  . antiseptic oral rinse  15 mL Mouth Rinse QID  . aspirin  81 mg Per Tube Daily  . budesonide  0.5 mg Nebulization BID  . cefTRIAXone (ROCEPHIN)  IV  2 g Intravenous Q24H  . dextrose  25 mL Intravenous Once  . feeding supplement  30 mL Per Tube Daily  . folic acid  1 mg Per Tube Daily  . free water  200 mL Per Tube 5 X Daily  . insulin aspart  0-5 Units Subcutaneous QHS  . insulin aspart  0-9 Units Subcutaneous Q4H  . insulin glargine  20 Units Subcutaneous BID  . ipratropium  0.5 mg Nebulization TID  . levothyroxine  88 mcg Per Tube QAC breakfast  . predniSONE  40 mg Per Tube Q breakfast  . QUEtiapine  50 mg Oral QHS  . thiamine  100 mg Per Tube Daily  . DISCONTD: albuterol  2.5 mg Nebulization Q6H  . DISCONTD: ipratropium  0.5 mg Nebulization Q6H   Continuous Infusions:    . feeding supplement (OSMOLITE 1.5 CAL) Stopped (10/15/12 0815)   PRN Meds:.acetaminophen, acetaminophen, albuterol, dextrose, iohexol, LORazepam,  methocarbamol, ondansetron (ZOFRAN) IV, ondansetron, polyethylene glycol, sodium chloride, sorbitol, zolpidem  Labs:  CMP     Component Value Date/Time   NA 139 10/11/2012 0620   K 4.2 10/11/2012 0620   CL 99 10/11/2012 0620   CO2 30 10/11/2012 0620   GLUCOSE 271* 10/11/2012 0620   BUN 27* 10/11/2012 0620   CREATININE 0.59 10/11/2012 0620   CALCIUM 9.1 10/11/2012 0620   PROT 6.4 10/06/2012 0545   ALBUMIN 2.0* 10/06/2012 0545   AST 97* 10/06/2012 0545   ALT 50 10/06/2012 0545   ALKPHOS 159* 10/06/2012 0545   BILITOT 0.7 10/06/2012 0545   GFRNONAA >90 10/11/2012 0620   GFRAA >90 10/11/2012 0620   CBG (last 3)   Basename 10/15/12 1142 10/15/12 0748 10/15/12 0356  GLUCAP 84 157* 117*     Intake/Output Summary (Last 24 hours) at 10/15/12 1232 Last data filed at 10/15/12 1000  Gross per 24 hour  Intake      0 ml  Output   2300 ml  Net  -2300 ml  BM 10/14  Weight Status:  54.2 kg (10/16) down from 56.4 kg (10/3)  Body mass index is 17.70 kg/(m^2). underweight  Re-estimated needs:  1900 - 2100 kcal, 85-100 grams protein  Nutrition Dx:  Swallowing difficulty r/t cervical fracture AEB PEG tube required for  nutrition. Ongoing.  Goal:  Meet 90-100% estimated nutrition needs with EN - unmet 2/2 TF on hold  Monitor:  TF tolerance, weight trend, labs  Jarold Motto MS, RD, LDN Pager: [phone] After-hours pager: [phone]

## 2012-10-15 NOTE — Progress Notes (Signed)
BP 108/66  Pulse 71  Temp 98.2 F (36.8 C) (Oral)  Resp 19  Ht 5' 8.9" (1.75 m)  Wt 119 lb 7.8 oz (54.2 kg)  BMI 17.70 kg/m2  SpO2 95% Just replaced G-tube yesterday with 56fr balloon retention. C/o leaking around site and mild pain Expcect some pain since this was upsized. Would recommend holding TF at least 24 hrs. Will reassess in am and try restarting TF at lower rate.  Brayton El PA-C 9:27 AM

## 2012-10-15 NOTE — Progress Notes (Signed)
Speech Language Pathology Daily Session Note  Patient Details  Name: [name] MRN: [id number] Date of Birth: 26-Jan-1931  Today's Date: 10/15/2012 Time: 1130-1155 Time Calculation (min): 25 min  Short Term Goals: Week 2: SLP Short Term Goal 1 (Week 2): Pt will demonstrate functional problem solving for functional tasks with Mod A SLP Short Term Goal 2 (Week 2): Pt will demonstrate selective attention to task for 10 minutes with Mod A verbal cues for redirection SLP Short Term Goal 3 (Week 2): Pt will increased vocal intensity to increase overall speech intelligibility with Min A verbal cues.  SLP Short Term Goal 4 (Week 2): Pt will demonstrate use of laryngeal closure techniques (eg. supraglottic swallow, glottal closure) in an effort to assist with airway protection during functional swallowing with max assist  Skilled Therapeutic Interventions: Treatment focus on dysphagia goals. Pt required Max encouragement for participation due to sacral pain and unable to tolerate sitting up for extended amounts of time.  Clinician performed oral care and administered two trials of ice chips with a strong, productive cough.  Pt perseverative on getting back into and transferred with assistance.    FIM:  Comprehension Comprehension Mode: Auditory Comprehension: 5-Understands basic 90% of the time/requires cueing < 10% of the time Expression Expression Mode: Verbal Expression: 5-Expresses basic 90% of the time/requires cueing < 10% of the time. Social Interaction Social Interaction: 4-Interacts appropriately 75 - 89% of the time - Needs redirection for appropriate language or to initiate interaction. Problem Solving Problem Solving: 2-Solves basic 25 - 49% of the time - needs direction more than half the time to initiate, plan or complete simple activities Memory Memory: 3-Recognizes or recalls 50 - 74% of the time/requires cueing 25 - 49% of the time  Pain Unable to rate, pt repositioned  and RN notified   Therapy/Group: Individual Therapy  Larraine Argo 10/15/2012, 1:21 PM

## 2012-10-15 NOTE — Progress Notes (Signed)
Patient peg tube is leaking. Patient c/o right flank pain. Tylenol 650 mg given.Dr. Riley Kill notified.

## 2012-10-15 NOTE — Progress Notes (Signed)
Inpatient RehabilitationTeam Conference Note Date: 10/14/2012   Time: 2:10 PM     Patient Name: [name]      Medical Record Number: [id number]  Date of Birth: 03-06-1931 Sex: Male         Room/Bed: 4001/4001-01 Payor Info: Payor: [employer] copy[employer] MEDICARE  Plan: [facility]  Product Type: *No Product type*     Admitting Diagnosis: central cord peg  Admit Date/Time:  10/03/2012  2:45 PM Admission Comments: No comment available   Primary Diagnosis:  Central cord syndrome Principal Problem: Central cord syndrome  Patient Active Problem List   Diagnosis Date Noted  . HAP (hospital-acquired pneumonia) 10/07/2012  . Central cord syndrome 10/03/2012  . Cervical spine fracture 09/26/2012  . Respiratory failure, post-operative 09/26/2012  . HCC (hepatocellular carcinoma) 04/08/2012  . 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 DM (diabetes mellitus) 04/08/2012  . Hypertension 04/08/2012  . Hypothyroid 04/08/2012  . Alcohol abuse, in remission 04/08/2012    Expected Discharge Date: Expected Discharge Date: 10/24/12  Team Members Present: Physician: Dr. Faith Rogue Social Worker Present: Amada Jupiter, LCSW Nurse Present: Other (comment) (Melissa Ramgeet, RN) PT Present: Karolee Stamps, PT OT Present: Mackie Pai, OT;Patricia Mat Carne, OT SLP Present: Feliberto Gottron, SLP Other (Discipline and Name): Tora Duck, PPS Coordinator     Current Status/Progress Goal Weekly Team Focus  Medical   pneumonia better. TF issues. tube just pulled out  medical stability, participation in therapies  surgical mgt of tube, abx and mgt of pneumonia   Bowel/Bladder   Foley to straight drain. Continent of bowel. LBM 10/13/12         Swallow/Nutrition/ Hydration   NPO  rediness for trials of ice chips  effortful swallows, oral care, management of secretions    ADL's   overall total assist, patient un-willing to perform ADLs at this time; min assist for transfers currently  supervision -> min assist  ADL  retraining, ADL transfers, functional use of bilateral UEs, UEHEP, overall activity tolerance/endurance   Mobility   mod A overall when actively participating  S/min A overall  consistent participation, gait, transfers, endurance, dynamic balance   Communication             Safety/Cognition/ Behavioral Observations  Mod A  Min A  participation, reasoning, problem solving, awareness    Pain   No c/o  <3  Monitor   Skin   Posterior cervical incision with steristrips, cdi,  L knee scab, OTA, unremarkable. Sacrum with bruising area near coccyx  No additional skin breakdown  Continue to turn q 2hrs    Rehab Goals Patient on target to meet rehab goals: Yes *See Interdisciplinary Assessment and Plan and progress notes for long and short-term goals  Barriers to Discharge: see prior, partcipation    Possible Resolutions to Barriers:  see prior, family ed    Discharge Planning/Teaching Needs:  Plan to d/c home with caregiver and daughter providing 24/7 assistance.      Team Discussion:  Peg accidentally removed - for replacement today.  Motivation continues to be poor, despite pt being very capable of reaching minimal assistance goals.  Pulmonary status remains compromised.  Will  Request family involvement now to see if this affects his participation  Revisions to Treatment Plan:  none   Continued Need for Acute Rehabilitation Level of Care: The patient requires daily medical management by a physician with specialized training in physical medicine and rehabilitation for the following conditions: Daily direction of a multidisciplinary physical rehabilitation program to ensure safe  treatment while eliciting the highest outcome that is of practical value to the patient.: Yes Daily medical management of patient stability for increased activity during participation in an intensive rehabilitation regime.: Yes Daily analysis of laboratory values and/or radiology reports with any subsequent need  for medication adjustment of medical intervention for : Pulmonary problems;Post surgical problems;Neurological problems;Other  [name] 10/15/2012, 1:58 PM

## 2012-10-15 NOTE — Progress Notes (Signed)
Occupational Therapy Note  Patient Details  Name: SIGMUND MINNIEFIELD MRN: [id number] Date of Birth: 1931-12-10 Today's Date: 10/15/2012  Time: 1000-1045 Pt c/o pain in bottom (unrated); repositioned and RN aware Individual Therapy Pt missed 15 mins skilled OT services secondary to RN setting up IV medications. Pt initially stated he couldn't participate because his site around newly placed feeding tube was leaking.  Verified with RN, PA, and MD that patient could participate in therapy.  Pt agreeable to getting up, changing hospital gown, and sitting in recliner.  Pt required min A for supine to sit and for stand pivot transfer to recliner.  Pt sat EOB for approx 15 mins without UE support while therapist prepeared recliner for transfer.  Focus on participation, activity tolerance, bed mobility, transfers, and sit<>stand.   Lavone Neri [facility] 10/15/2012, 3:17 PM

## 2012-10-15 NOTE — Plan of Care (Signed)
Problem: SCI BLADDER ELIMINATION Goal: RH STG MANAGE BLADDER WITH EQUIPMENT WITH ASSISTANCE STG Manage Bladder With Equipment With Max Assistance  Outcome: Not Progressing Foley catheter in place. Goal: RH STG SCI MANAGE BLADDER PROGRAM W/ASSISTANCE Outcome: Not Progressing Foley Catheter in place. Goal: RH OTHER STG BLADDER ELIMINATION GOALS W/ASSIST Other STG Bladder Elimination Goals With Assistance  Outcome: Not Progressing Foley Catheter in place.

## 2012-10-15 NOTE — Progress Notes (Signed)
Subjective/Complaints: Some pain at tube site as well as right flank A 12 point review of systems has been performed and if not noted above is otherwise negative.  Objective: Vital Signs: Blood pressure 108/66, pulse 71, temperature 98.2 F (36.8 C), temperature source Oral, resp. rate 19, height 5' 8.9" (1.75 m), weight 54.2 kg (119 lb 7.8 oz), SpO2 99.00%. Dg Abd 1 View  10/13/2012  *RADIOLOGY REPORT*  Clinical Data: Peg tube reinsertion.  ABDOMEN - 1 VIEW  Comparison: None.  Findings: Contrast has been injected through the peg tube and the injected contrast is all within the stomach and duodenal bulb.  There is faint contrast throughout the nondistended colon.  No small bowel dilatation.  Numerous surgical clips in the right upper quadrant.  IMPRESSION: Peg tube appears in good position.   Original Report Authenticated By: Gwynn Burly, M.D.    Ir Replc Gastro/colonic Tube Percut W/fluoro  10/14/2012  *RADIOLOGY REPORT*  Clinical Data: Gastrostomy tube was dislodged and replaced with a Foley catheter.  The patient needs placement of a new gastrostomy tube.  GASTROSTOMY CATHETER REPLACEMENT  Comparison: 10/13/2012  Findings: The Foley catheter and surrounding skin were prepped and draped in a sterile fashion.  Maximal barrier sterile technique was utilized including caps, mask, sterile gowns, sterile gloves, sterile drape, hand hygiene and skin antiseptic.  Contrast was injected through the Foley catheter and demonstrated placement within the stomach.  The Foley catheter was removed over a stiff Glidewire.  A new 26-French balloon retention gastrostomy tube was placed over the wire.  The balloon was inflated with 10 ml of saline.  Contrast injection confirmed placement in the stomach. Fluoroscopic images were taken and saved for this procedure.  IMPRESSION: Successful replacement of the percutaneous gastrostomy tube with fluoroscopy.   Original Report Authenticated By: Richarda Overlie, M.D.    No  results found for this basename: WBC:2,HGB:2,HCT:2,PLT:2 in the last 72 hours No results found for this basename: NA:2,K:2,CL:2,CO2:2,GLUCOSE:2,BUN:2,CREATININE:2,CALCIUM:2 in the last 72 hours CBG (last 3)   Basename 10/15/12 0356 10/14/12 2357 10/14/12 2153  GLUCAP 117* 134* 142*    Wt Readings from Last 3 Encounters:  10/15/12 54.2 kg (119 lb 7.8 oz)  10/02/12 56.4 kg (124 lb 5.4 oz)  10/02/12 56.4 kg (124 lb 5.4 oz)    Physical Exam:  Constitutional:  Patient with poor dental dentition  HENT:  Head: Normocephalic.  edentulous  Eyes:  Pupils round and reactive to light  Neck:  Cervical collar intact  Cardiovascular: Normal rate and regular rhythm.  Pulmonary/Chest: a few basilar crackles less rhonchi.  He has no wheezes. No distress  Abdominal: Soft. Bowel sounds are normal. He exhibits no distension. Dressing around G-tube is soaked, area macerated.  Musculoskeletal: He exhibits no edema. Some pain at right flank, into r iliac crest Neurological: He is alert.  Reasonable insight and awareness. Was joking with me on occasion. Intermittent confusion today Patient made good eye contact with examiner. LUE and BUE are 3 to 3+/5.  LE's are grossly 3+ to 4/5. Sensation slightly diminished left greater than right upper extremities. Speech clearer. Voice is stronger. Skin:     Assessment/Plan: 1. Functional deficits secondary to C3-4 fx with central cord injury which require 3+ hours per day of interdisciplinary therapy in a comprehensive inpatient rehab setting. Physiatrist is providing close team supervision and 24 hour management of active medical problems listed below. Physiatrist and rehab team continue to assess barriers to discharge/monitor patient progress toward functional and medical goals. FIM:  FIM - Bathing Bathing Steps Patient Completed: Right upper leg;Left upper leg Bathing: 1: Total-Patient completes 0-2 of 10 parts or less than 25%  FIM - Upper Body  Dressing/Undressing Upper body dressing/undressing: 0: Wears gown/pajamas-no public clothing FIM - Lower Body Dressing/Undressing Lower body dressing/undressing: 0: Wears Oceanographer  FIM - Hotel manager Devices: Grab bar or rail for support Toileting: 1: Two helpers (2nd person A to manage IV poles and O2 tank)  FIM - Diplomatic Services operational officer Devices: Grab bars;Walker Toilet Transfers: 3-From toilet/BSC: Mod A (lift or lower assist);3-To toilet/BSC: Mod A (lift or lower assist)  FIM - Bed/Chair Transfer Bed/Chair Transfer Assistive Devices: Walker;HOB elevated Bed/Chair Transfer: 4: Supine > Sit: Min A (steadying Pt. > 75%/lift 1 leg);4: Bed > Chair or W/C: Min A (steadying Pt. > 75%)  FIM - Locomotion: Wheelchair Locomotion: Wheelchair: 1: Total Assistance/staff pushes wheelchair (Pt<25%) FIM - Locomotion: Ambulation Locomotion: Ambulation Assistive Devices: Designer, industrial/product Ambulation/Gait Assistance: 4: Min guard Locomotion: Ambulation: 1: Travels less than 50 ft with minimal assistance (Pt.>75%)  Comprehension Comprehension Mode: Auditory Comprehension: 5-Understands basic 90% of the time/requires cueing < 10% of the time  Expression Expression Mode: Verbal Expression: 5-Expresses basic 90% of the time/requires cueing < 10% of the time.  Social Interaction Social Interaction: 4-Interacts appropriately 75 - 89% of the time - Needs redirection for appropriate language or to initiate interaction.  Problem Solving Problem Solving: 2-Solves basic 25 - 49% of the time - needs direction more than half the time to initiate, plan or complete simple activities  Memory Memory: 3-Recognizes or recalls 50 - 74% of the time/requires cueing 25 - 49% of the time  Medical Problem List and Plan:  1. 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 DVT Prophylaxis/Anticoagulation: SCDs. Monitor for any  signs of DVT  3. Pain Management: Tylenol and robaxin prn  4. Mood/delirium/acute encephalopathy.still some sundowning and hs confusion   -admittedly drank "2-3 beers at night" PTA.  -?old CVA on head CT. I suspect he suffered a head trauma here as well. 5. Neuropsych: This patient is not capable of making decisions on his/her own behalf.  6. Dysphagia. Status post gastrostomy tube placement, tube was accidentally removed when it was caught in the bed yesterday. A replacment tube with balloon rentention was placed  -the area is profusely draining..    -will ask surgery to make any recs.  -continue frequent dressing changes for now 7. Aspiration pneumonia/ respiratory failure. -see below 8. Hypothyroidism. Synthroid  9. Diabetes mellitus. Check blood sugars every 4 hours while on tube feeds. lantus insulin increased to 20 units q12 . Patient was on tradgenta 5 mg daily prior to admission 10. Leukocytosis: aspiration pneumonia  -vanc and merem per ccm recs, respiratory culture with serratia--CCM narrowed to ceftriaxone thru 10/16  -adjusted TF, keep HOB. Will need to go home on continuous  -IS, FV, OOB routinely with therapy  -afebrile, sats are stable, still with cough and secretions at times  -cxr shows improvement, his voice is stronger  LOS (Days) 12 A FACE TO FACE EVALUATION WAS PERFORMED  Reighan Hipolito T 10/15/2012, 7:01 AM

## 2012-10-15 NOTE — Progress Notes (Signed)
At approximately 2006 pm blood glucose was 58 notified Dan A., PA given order for D 5 1/2 normal saline at 75 ml/hr, blood glucose at 2044 pm 65. Continued to monitor patient no signs or symptoms of hypoglycemia noted. At 2135 pm blood glucose was 50 notified Dan A.,PA given order for amp dextrose 50 give 25 ml. At 2220 pm blood glucose 87. Will continue to monitor patient.

## 2012-10-15 NOTE — Progress Notes (Signed)
Physical Therapy Note  Patient Details  Name: Samuel MCCREEDY MRN: [id number] Date of Birth: 07/21/1931 Today's Date: 10/15/2012  Pt missed 60 minute skilled PT session due to PEG leaking and awaiting IR to come see pt. RN recommended not to mobilize pt due to amounts of leakage. Pt also refused quickly any therapy at this time. Will attempt later today to see pt to make up missed time. RN aware.  Karolee Stamps [facility] 10/15/2012, 9:37 AM

## 2012-10-16 ENCOUNTER — Inpatient Hospital Stay (HOSPITAL_COMMUNITY): Payer: PRIVATE HEALTH INSURANCE | Admitting: Occupational Therapy

## 2012-10-16 ENCOUNTER — Inpatient Hospital Stay (HOSPITAL_COMMUNITY): Payer: PRIVATE HEALTH INSURANCE

## 2012-10-16 ENCOUNTER — Inpatient Hospital Stay (HOSPITAL_COMMUNITY): Payer: PRIVATE HEALTH INSURANCE | Admitting: Speech Pathology

## 2012-10-16 LAB — GLUCOSE, CAPILLARY
Glucose-Capillary: 65 mg/dL — ABNORMAL LOW (ref 70–99)
Glucose-Capillary: 59 mg/dL — ABNORMAL LOW (ref 70–99)
Glucose-Capillary: 65 mg/dL — ABNORMAL LOW (ref 70–99)

## 2012-10-16 MED ORDER — DEXTROSE-NACL 5-0.2 % IV SOLN
INTRAVENOUS | Status: AC
  Administered 2012-10-16 – 2012-10-18 (×4): via INTRAVENOUS

## 2012-10-16 MED ORDER — MUSCLE RUB 10-15 % EX CREA
TOPICAL_CREAM | Freq: Three times a day (TID) | CUTANEOUS | Status: DC
  Administered 2012-10-16 – 2012-10-18 (×7): via TOPICAL
  Administered 2012-10-18: 1 via TOPICAL
  Administered 2012-10-19 – 2012-10-21 (×7): via TOPICAL
  Administered 2012-10-22: 1 via TOPICAL
  Administered 2012-10-22 – 2012-10-23 (×4): via TOPICAL
  Administered 2012-10-23: 1 via TOPICAL
  Administered 2012-10-24 – 2012-11-03 (×23): via TOPICAL
  Filled 2012-10-16 (×2): qty 85

## 2012-10-16 MED ORDER — INSULIN GLARGINE 100 UNIT/ML ~~LOC~~ SOLN: 10.0000 [IU] | Freq: Two times a day (BID) | SUBCUTANEOUS | Status: DC

## 2012-10-16 NOTE — Progress Notes (Signed)
BP 110/72  Pulse 56  Temp 96.7 F (35.9 C) (Axillary)  Resp 17  Ht 5' 8.9" (1.75 m)  Wt 119 lb 7.8 oz (54.2 kg)  BMI 17.70 kg/m2  SpO2 93% Recheck on G-tube site Looks good. Still small amt drainage on dressing, unable to determine how long, but overall not a large amount. Balloon inflated and feels snug Tube flushes easily without resistance.  Can try to resume TF at low rate and gradually titrate up to goal as long as tolerated. If recurrent leakage around tube, would be concerned about high residuals or gastroparesis.

## 2012-10-16 NOTE — Progress Notes (Signed)
Hypoglycemic Event  CBG: 44  Treatment: D50 IV 25 mL.  Symptoms: None   Follow-up CBG: Time:0900 CBG Result:not obtained at this time.  Possible Reasons for Event: Inadequate meal intake  Comments/MD notified:D50 IV given 2210. Dr. Riley Kill notified re: 0800 CBG with order provided this a.m., hold a.m. lantus and repeat CBG in 1 hr. TP    [name] D  Remember to initiate Hypoglycemia Order Set & complete

## 2012-10-16 NOTE — Progress Notes (Signed)
Occupational Therapy Session Note  Patient Details  Name: [name] MRN: [id number] Date of Birth: 1931-10-18  Today's Date: 10/16/2012 Time: 0730-0825 Time Calculation (min): 55 min  Short Term Goals: Week 1:  OT Short Term Goal 1 (Week 1): Pt will sit EOB to don shirt with steady A OT Short Term Goal 1 - Progress (Week 1): Not met OT Short Term Goal 2 (Week 1): Pt will don shirt with mod A OT Short Term Goal 2 - Progress (Week 1): Not met OT Short Term Goal 3 (Week 1): Pt will transfer to toilet / BSC with mod A OT Short Term Goal 3 - Progress (Week 1): Met OT Short Term Goal 4 (Week 1): Pt and therapist will explore options for donning LB clothing with AE/ apaptive methods OT Short Term Goal 4 - Progress (Week 1): Not met OT Short Term Goal 5 (Week 1): Pt will be able to pull down pants for toileting with steady A OT Short Term Goal 5 - Progress (Week 1): Not met  Week 2:  OT Short Term Goal 1 (Week 2): Patient will tolerate being OOB, sitting in w/c or recliner, for at least an hour OT Short Term Goal 2 (Week 2): Patient will attempt to wash face using modified wash cloth, if needed, with set-up assistance OT Short Term Goal 3 (Week 2): Therapist will complete PROM exercises to bilateral shoulders, elbows, forearms, wrist, and fingers up to patients tolerance level at least 4times a week in order to stretch muscles and help with strenthening weak UEs OT Short Term Goal 4 (Week 2): Patient will perform [facility] transfer with min assist  Skilled Therapeutic Interventions/Progress Updates:  Patient with complaints of shoulder pain during session, no rate given; RN aware Patient found supine in bed and "cold". With encouragement patient willing to participate in ADL. Patient engaged in bed mobility to sit edge of bed. Patient then sat edge of bed and focused on UB/LB bathing & dressing. During ADL focused on functional use of bilateral UEs, overall activity tolerance/endurance, dynamic  sitting balance/tolerance/endurance, sit/stands, and w/c stand pivot transfer using rolling walker. Patient's glucose levels low during session and patient with complaints of dizziness. Encouraged pursed lip breathing and notified RN to check BP and 02 sats. Left patient seated in w/c with RN present in room.   Precautions:  Precautions Precautions: Fall;Cervical Cervical Brace: Hard collar Restrictions Weight Bearing Restrictions: No  See FIM for current functional status  Therapy/Group: Individual Therapy  Huckleberry Martinson 10/16/2012, 8:44 AM

## 2012-10-16 NOTE — Progress Notes (Signed)
Physical Therapy Session Note  Patient Details  Name: [name] MRN: [id number] Date of Birth: 1931-08-06  Today's Date: 10/16/2012 Time: 6962-9528 Time Calculation (min): 57 min  Short Term Goals: Week 2:  PT Short Term Goal 1 (Week 2): Pt will tolerate OOB x 1 hr a day at least PT Short Term Goal 1 - Progress (Week 2): Met PT Short Term Goal 2 (Week 2): Pt will gait with mod A x 15' for functional mobility PT Short Term Goal 2 - Progress (Week 2): Met PT Short Term Goal 3 (Week 2): Pt will complete supine to sit with mod A for improved bed mobility PT Short Term Goal 3 - Progress (Week 2): Met PT Short Term Goal 4 (Week 2): Pt will transfer 3/4 trials with mod A s PT Short Term Goal 4 - Progress (Week 2): Met Week 3:  PT Short Term Goal 1 (Week 3): = LTGS  Skilled Therapeutic Interventions/Progress Updates:  Treatment focused on bed mobility, transfers, sit to stands, gait with RW (>50' with min A) and stair negotiation with min A. Discussed and educated on how this level of participation is ideal for goals to go home and to improve lung function and ultimately work on improving swallowing. Pt at overall min A level and had excellent participation today!  Therapy Documentation Precautions:  Precautions Precautions: Fall;Cervical Cervical Brace: Hard collar Restrictions Weight Bearing Restrictions: No    Pain:  Denies pain   Locomotion : Ambulation Ambulation/Gait Assistance: 4: Min assist   See FIM for current functional status  Therapy/Group: Individual Therapy  Karolee Stamps [facility] 10/16/2012, 2:02 PM

## 2012-10-16 NOTE — Progress Notes (Signed)
SLP Cancellation Note  Patient Details Name: LINARD DAFT MRN: [id number] DOB: 10/25/1931   Cancelled treatment:       Pt missed 45 minutes of skilled SLP treatment due to pt not feeling well and wanting to rest.    Connie Lasater 10/16/2012, 11:26 AM

## 2012-10-16 NOTE — Progress Notes (Signed)
Hypoglycemic Event  CBG: 69  Treatment: D50 IV 25 mL  Symptoms: Vision changes and None  Follow-up CBG: Time:0128 CBG Result:79  Possible Reasons for Event: Inadequate meal intake and Unknown  Comments/MD notified:Above treatment given for prior CBG. Harvel Ricks PA contacted for 0001 CBG, order as follow: re-check if symptomatic, do not obtain 0400 CBG. TP    Markus Daft D  Remember to initiate Hypoglycemia Order Set & complete

## 2012-10-16 NOTE — Progress Notes (Signed)
Nutrition Follow-up  Intervention:   1. Resume TF of Osmolite 1.5 at 25 ml/hr x 20 hours with 30 ml Prostat per tube daily. Advance formula by 10 ml/hr every 8 hours, or as tolerated, to goal of 65 ml/hr. 2. RD to continue to follow nutrition care plan  Assessment:   Radiology saw pt this morning and recommended resuming TF at low rate and gradually titrating up to goal as long as tolerated. Discussed with PA Deatra Ina, and he verbalized RD to initiate feedings per Radiology's recommendations.  Discussed plan for initiation of TF with RN Scheryl Marten.  Diet Order:  NPO  TF: Osmolite 1.5 at 65 ml/hr x 20 hours with 30 ml Prostat per tube daily providing 2050 kcal, 97 grams protein and 988 ml free water.  (ON HOLD)  Free water: 200 ml five times daily  Total water provision is 1988 ml daily.  Meds: Scheduled Meds:    . albuterol  2.5 mg Nebulization TID  . antiseptic oral rinse  15 mL Mouth Rinse QID  . aspirin  81 mg Per Tube Daily  . budesonide  0.5 mg Nebulization BID  . dextrose      . feeding supplement  30 mL Per Tube Daily  . folic acid  1 mg Per Tube Daily  . free water  200 mL Per Tube 5 X Daily  . insulin aspart  0-5 Units Subcutaneous QHS  . insulin aspart  0-9 Units Subcutaneous Q4H  . insulin glargine  10 Units Subcutaneous BID  . ipratropium  0.5 mg Nebulization TID  . levothyroxine  88 mcg Per Tube QAC breakfast  . Muscle Rub   Topical TID  . predniSONE  40 mg Per Tube Q breakfast  . QUEtiapine  50 mg Oral QHS  . thiamine  100 mg Per Tube Daily  . DISCONTD: insulin glargine  10 Units Subcutaneous BID  . DISCONTD: insulin glargine  20 Units Subcutaneous BID   Continuous Infusions:    . feeding supplement (OSMOLITE 1.5 CAL) Stopped (10/15/12 0815)  . DISCONTD: sodium chloride 75 mL/hr at 10/15/12 1520  . DISCONTD: dextrose 5 % and 0.2 % NaCl 75 mL/hr at 10/16/12 0918   PRN Meds:.acetaminophen, acetaminophen, albuterol, dextrose, LORazepam, methocarbamol,  ondansetron (ZOFRAN) IV, ondansetron, polyethylene glycol, sodium chloride, sorbitol, zolpidem  Labs:  CMP     Component Value Date/Time   NA 139 10/11/2012 0620   K 4.2 10/11/2012 0620   CL 99 10/11/2012 0620   CO2 30 10/11/2012 0620   GLUCOSE 271* 10/11/2012 0620   BUN 27* 10/11/2012 0620   CREATININE 0.59 10/11/2012 0620   CALCIUM 9.1 10/11/2012 0620   PROT 6.4 10/06/2012 0545   ALBUMIN 2.0* 10/06/2012 0545   AST 97* 10/06/2012 0545   ALT 50 10/06/2012 0545   ALKPHOS 159* 10/06/2012 0545   BILITOT 0.7 10/06/2012 0545   GFRNONAA >90 10/11/2012 0620   GFRAA >90 10/11/2012 0620   CBG (last 3)   Basename 10/16/12 1023 10/16/12 0923 10/16/12 0811  GLUCAP 66* 59* 65*     Intake/Output Summary (Last 24 hours) at 10/16/12 1121 Last data filed at 10/16/12 0446  Gross per 24 hour  Intake      0 ml  Output   2000 ml  Net  -2000 ml  BM 10/14  Weight Status:  54.2 kg (10/16) down from 56.4 kg (10/3)  Body mass index is 17.70 kg/(m^2). underweight  Re-estimated needs:  1900 - 2100 kcal, 85-100 grams protein  Nutrition  Dx:  Swallowing difficulty r/t cervical fracture AEB PEG tube required for nutrition. Ongoing.  Goal:  Meet 90-100% estimated nutrition needs with EN - unmet 2/2 TF on hold  Monitor:  TF tolerance, weight trend, labs  Jarold Motto MS, RD, LDN Pager: [phone] After-hours pager: [phone]

## 2012-10-16 NOTE — Significant Event (Signed)
Pt c/o of blurred vision, and dizziness when sitting on side of bed.  BG 65, BP 72/44. MD made aware, order given to continue with IV fluids, with anticipation of TF to be restarted when pt is seen by Intervention Radiology. Staff  continued to monitor.  BP rechecked, WNL. Awaiting Intervention Radiology to assess peg site.   Aundra Dubin, PA in to assess pt's peg site per PA note at 10:48. Per PA's documentation, okay to use peg site for tube feeding.  At 12:20, writer noted wet spot on left side of pt's gown. Dark brown, circular shape drainage on gauze, along with thick gelatin discharge on right side of tubing. Gauze 100% saturated with tan color drainage. Old dressing viewed by Marissa Nestle, PA. Saved dressing in biohazard bag for Intervention Radiology PA to reassess. Dinah Beers, PA made aware. Verbal order from Jesusita Oka to contact Brayton El, Radiologist PA.   Brayton El, Pa notified. Advised of above finding. Was told by Caryn Bee that color on gauze could be indicative of irritation around site. If leaking continued from peg site, may need to give stomach additional time to rest. Order given to initiate tube feeding at 58ml/hr. .  Tube feeding initiated at 1430. Reassessed peg site. Skin around site red. Copious amount of tan color drainage leaking from ride side of peg site with movement of tubing, or when pt coughed. Drainage occurred continuously at time. Tube feeding inside of peg tubing, yellow, and curdling in appearance, with dark granules intermittently throughout. Dan, PA called to room to assess leakage from site. Reconfirmed that leakage was not from tube feeding, as it was too thin in appearance. Advised to recontact [employer], PA.   Caryn Bee, PA paged at [phone].  Order received to d/c tube feeding, and was advised  that we may need to considered  implementing TNA. Jesusita Oka, PA notified. Ordered received to d/c tube feeding and to reinitiated D5 0.2 NaCL @75ml /hr at this time.Marland Kitchen

## 2012-10-16 NOTE — Progress Notes (Signed)
Patient peg tube leaking. Dark brown circular shape drainage noted around peg tube dressing. Dark granules seen out of the peg tubing.Redness noted around the peg tube site . Dressing changed. Continuous D5 0.2 NACl running @75  ml/hr. Sorbitol given. Huge amount of dark brown loose stool noted. CBG 90 no signs and symptoms of hypoglycemia noted. Will continue to monitor frequently.

## 2012-10-16 NOTE — Progress Notes (Signed)
Physical Therapy Weekly Progress Note  Patient Details  Name: [name] MRN: [id number] Date of Birth: 1931-01-15  Today's Date: 10/16/2012  Patient has met 4 of 4 short term goals.  Pt has made very minimal gains with functional participation. When willing, pt is able to transfer and do short distance gait with RW with min A. Pt requires min A to mod A with bed mobility. Pt has had complication with PEG tube feedings this past week as well. Family education is to be initiated in the next few days.   Patient continues to demonstrate the following deficits: impaired balance, quadriparesis, decreased functional mobility, impaired gait, decreased activity tolerance  and therefore will continue to benefit from skilled PT intervention to enhance overall performance with activity tolerance, balance, postural control, ability to compensate for deficits and functional use of  right upper extremity and right lower extremity.  Patient is making slow gains towards goals. .  Continue plan of care.  PT Short Term Goals Week 2:  PT Short Term Goal 1 (Week 2): Pt will tolerate OOB x 1 hr a day at least PT Short Term Goal 1 - Progress (Week 2): Met PT Short Term Goal 2 (Week 2): Pt will gait with mod A x 15' for functional mobility PT Short Term Goal 2 - Progress (Week 2): Met PT Short Term Goal 3 (Week 2): Pt will complete supine to sit with mod A for improved bed mobility PT Short Term Goal 3 - Progress (Week 2): Met PT Short Term Goal 4 (Week 2): Pt will transfer 3/4 trials with mod A s PT Short Term Goal 4 - Progress (Week 2): Met Week 3:  PT Short Term Goal 1 (Week 3): = LTGS  Skilled Therapeutic Interventions/Progress Updates:  Ambulation/gait training;Balance/vestibular training;Cognitive remediation/compensation;Discharge planning;DME/adaptive equipment instruction;Community reintegration;Functional mobility training;Psychosocial support;Patient/family education;Pain management;Neuromuscular  re-education;Skin care/wound management;Splinting/orthotics;Stair training;Therapeutic Activities;UE/LE Coordination activities;UE/LE Strength taining/ROM;Therapeutic Exercise;Wheelchair propulsion/positioning   Therapy Documentation Precautions:  Precautions Precautions: Fall;Cervical Cervical Brace: Hard collar Restrictions Weight Bearing Restrictions: No  See FIM for current functional status   Karolee Stamps [facility] 10/16/2012, 11:29 AM
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
## 2012-10-17 ENCOUNTER — Inpatient Hospital Stay (HOSPITAL_COMMUNITY): Payer: PRIVATE HEALTH INSURANCE | Admitting: Occupational Therapy

## 2012-10-17 ENCOUNTER — Inpatient Hospital Stay (HOSPITAL_COMMUNITY): Payer: PRIVATE HEALTH INSURANCE | Admitting: Speech Pathology

## 2012-10-17 ENCOUNTER — Encounter (HOSPITAL_COMMUNITY): Payer: Self-pay | Admitting: Radiology

## 2012-10-17 ENCOUNTER — Inpatient Hospital Stay (HOSPITAL_COMMUNITY): Payer: PRIVATE HEALTH INSURANCE

## 2012-10-17 ENCOUNTER — Inpatient Hospital Stay (HOSPITAL_COMMUNITY): Payer: PRIVATE HEALTH INSURANCE | Admitting: Physical Therapy

## 2012-10-17 DIAGNOSIS — S069X9A Unspecified intracranial injury with loss of consciousness of unspecified duration, initial encounter: Secondary | ICD-10-CM

## 2012-10-17 DIAGNOSIS — J690 Pneumonitis due to inhalation of food and vomit: Secondary | ICD-10-CM

## 2012-10-17 DIAGNOSIS — Z5189 Encounter for other specified aftercare: Secondary | ICD-10-CM

## 2012-10-17 DIAGNOSIS — IMO0002 Reserved for concepts with insufficient information to code with codable children: Secondary | ICD-10-CM

## 2012-10-17 DIAGNOSIS — R1310 Dysphagia, unspecified: Secondary | ICD-10-CM

## 2012-10-17 LAB — CBC WITH DIFFERENTIAL/PLATELET
Basophils Absolute: 0 10*3/uL (ref 0.0–0.1)
Basophils Relative: 0 % (ref 0–1)
Eosinophils Relative: 2 % (ref 0–5)
HCT: 36.3 % — ABNORMAL LOW (ref 39.0–52.0)
Hemoglobin: 12.6 g/dL — ABNORMAL LOW (ref 13.0–17.0)
Lymphocytes Relative: 18 % (ref 12–46)
MCHC: 34.7 g/dL (ref 30.0–36.0)
MCV: 88.8 fL (ref 78.0–100.0)
Monocytes Absolute: 1 10*3/uL (ref 0.1–1.0)
Monocytes Relative: 8 % (ref 3–12)
Neutro Abs: 8.7 10*3/uL — ABNORMAL HIGH (ref 1.7–7.7)
RDW: 14.4 % (ref 11.5–15.5)

## 2012-10-17 LAB — GLUCOSE, CAPILLARY
Glucose-Capillary: 113 mg/dL — ABNORMAL HIGH (ref 70–99)
Glucose-Capillary: 124 mg/dL — ABNORMAL HIGH (ref 70–99)
Glucose-Capillary: 145 mg/dL — ABNORMAL HIGH (ref 70–99)
Glucose-Capillary: 180 mg/dL — ABNORMAL HIGH (ref 70–99)

## 2012-10-17 LAB — CBC
Hemoglobin: 13 g/dL (ref 13.0–17.0)
Platelets: 216 10*3/uL (ref 150–400)
RBC: 4.23 MIL/uL (ref 4.22–5.81)
WBC: 12.4 10*3/uL — ABNORMAL HIGH (ref 4.0–10.5)

## 2012-10-17 LAB — BASIC METABOLIC PANEL
CO2: 24 mEq/L (ref 19–32)
Chloride: 94 mEq/L — ABNORMAL LOW (ref 96–112)
Glucose, Bld: 96 mg/dL (ref 70–99)
Potassium: 4.1 mEq/L (ref 3.5–5.1)
Sodium: 131 mEq/L — ABNORMAL LOW (ref 135–145)

## 2012-10-17 MED ORDER — IOHEXOL 300 MG/ML  SOLN
20.0000 mL | INTRAMUSCULAR | Status: AC
  Administered 2012-10-17 (×2): 20 mL via ORAL

## 2012-10-17 MED ORDER — PREDNISONE 10 MG PO TABS
10.0000 mg | ORAL_TABLET | Freq: Every day | ORAL | Status: AC
  Administered 2012-10-22 – 2012-10-23 (×2): 10 mg via ORAL
  Filled 2012-10-17 (×3): qty 1

## 2012-10-17 MED ORDER — SODIUM CHLORIDE 0.9 % IJ SOLN
10.0000 mL | INTRAMUSCULAR | Status: DC | PRN
  Administered 2012-10-18 – 2012-10-20 (×4): 10 mL
  Administered 2012-10-21: 20 mL
  Administered 2012-10-23 – 2012-10-28 (×6): 10 mL
  Administered 2012-10-28 – 2012-10-30 (×3): 20 mL

## 2012-10-17 MED ORDER — IOHEXOL 300 MG/ML  SOLN
80.0000 mL | Freq: Once | INTRAMUSCULAR | Status: AC | PRN
  Administered 2012-10-17: 80 mL via INTRAVENOUS

## 2012-10-17 MED ORDER — PREDNISONE 20 MG PO TABS
30.0000 mg | ORAL_TABLET | Freq: Every day | ORAL | Status: AC
  Administered 2012-10-19: 30 mg
  Filled 2012-10-17 (×2): qty 1

## 2012-10-17 MED ORDER — METOCLOPRAMIDE HCL 5 MG/ML IJ SOLN
5.0000 mg | Freq: Three times a day (TID) | INTRAMUSCULAR | Status: DC
  Administered 2012-10-17 – 2012-10-29 (×35): 5 mg via INTRAVENOUS
  Filled 2012-10-17 (×40): qty 1

## 2012-10-17 MED ORDER — PANTOPRAZOLE SODIUM 40 MG PO PACK
40.0000 mg | PACK | Freq: Two times a day (BID) | ORAL | Status: DC
  Administered 2012-10-19 – 2012-11-02 (×28): 40 mg
  Filled 2012-10-17 (×40): qty 20

## 2012-10-17 MED ORDER — PREDNISONE 20 MG PO TABS
20.0000 mg | ORAL_TABLET | Freq: Every day | ORAL | Status: AC
  Administered 2012-10-20 – 2012-10-21 (×2): 20 mg via ORAL
  Filled 2012-10-17 (×2): qty 1

## 2012-10-17 NOTE — Progress Notes (Signed)
Occupational Therapy Weekly Progress Note & Session Notes  Patient Details  Name: [name] MRN: [id number] Date of Birth: 29-Oct-1931  Today's Date: 10/17/2012  Patient has met 4 of 4 short term goals.  Patient is making steady progress on CIR. Over the past week patient has been able to tolerate more therapies and OOB tolerance has increased. Patient continues to need mod encouragement in order to participate, patient gets discouraged easily and states "I can't do it!". Patient's functional use of bilateral UEs/hands has improved over the past week and patient is able to grasp and release independently. Patient is total assist for basic ADL of bathing & dressing, but seems to be improving everyday. Patient's friend/roommate has been present once for some verbal education, plan to continue hands-on education over the next week prior to d/c home.   Patient has been having medical issues with PEG tube and with decreased nutritional intake. Nursing staff working to solve this issue. Patient has been weaker over the past couple of days.   Patient continues to demonstrate the following deficits: decreased overall activity tolerance/endurance, increased pain/discomfort, decreased independence with ADLs/IADLs, decreased independence with functional mobility, decreased functional use of bilateral UEs/hands. Therefore, patient will continue to benefit from skilled OT intervention to enhance overall performance with BADL, iADL and Reduce care partner burden.  Patient is slowly progressing toward long term goals..  Continue plan of care for now.   OT Short Term Goals Week 1:  OT Short Term Goal 1 (Week 1): Pt will sit EOB to don shirt with steady A OT Short Term Goal 1 - Progress (Week 1): Not met OT Short Term Goal 2 (Week 1): Pt will don shirt with mod A OT Short Term Goal 2 - Progress (Week 1): Not met OT Short Term Goal 3 (Week 1): Pt will transfer to toilet / BSC with mod A OT Short Term Goal 3  - Progress (Week 1): Met OT Short Term Goal 4 (Week 1): Pt and therapist will explore options for donning LB clothing with AE/ apaptive methods OT Short Term Goal 4 - Progress (Week 1): Not met OT Short Term Goal 5 (Week 1): Pt will be able to pull down pants for toileting with steady A OT Short Term Goal 5 - Progress (Week 1): Not met  Week 2:  OT Short Term Goal 1 (Week 2): Patient will tolerate being OOB, sitting in w/c or recliner, for at least an hour OT Short Term Goal 1 - Progress (Week 2): Met OT Short Term Goal 2 (Week 2): Patient will attempt to wash face using modified wash cloth, if needed, with set-up assistance OT Short Term Goal 2 - Progress (Week 2): Met OT Short Term Goal 3 (Week 2): Therapist will complete PROM exercises to bilateral shoulders, elbows, forearms, wrist, and fingers up to patients tolerance level at least 4times a week in order to stretch muscles and help with strenthening weak UEs OT Short Term Goal 3 - Progress (Week 2): Met OT Short Term Goal 4 (Week 2): Patient will perform BSC transfer with min assist OT Short Term Goal 4 - Progress (Week 2): Met  Week 3:  OT Short Term Goal 1 (Week 3): Short Term Goals = Long Term Goals  Skilled Therapeutic Interventions/Progress Updates:  Balance/vestibular training;Community reintegration;Cognitive remediation/compensation;Discharge planning;Disease mangement/prevention;DME/adaptive equipment instruction;Functional mobility training;Neuromuscular re-education;Pain management;Patient/family education;Psychosocial support;Self Care/advanced ADL retraining;Skin care/wound managment;Splinting/orthotics;Therapeutic Activities;Therapeutic Exercise;UE/LE Strength taining/ROM;UE/LE Coordination activities;Wheelchair propulsion/positioning   Precautions:  Precautions Precautions: Fall;Cervical Cervical Brace: Hard  collar Restrictions Weight Bearing Restrictions: No  See FIM for current functional status  SESSION  NOTES  Session #1 [id number] - 55 Minutes Individual Therapy No complaints of pain Patient found supine in bed with friend/roommate present in room. Therapist encouraged patient to sit edge of bed, respiratory therapy present for ~ a 5 minute treatment. Once respiratory treatment complete therapist discussed some educationally aspects with patient and [name] (friend/roommate); verbally educated Kay on UE PROM exercises, tub/shower-tub transfer bench, toilet transfers/BSC's, and ADLs. Patient refused bathing and dressing at this time, but willing to sit in bed side recliner. Educated [place] on safe and effective transfers using rolling walker. Recommend [name] come back in Monday for additional education. At end of session left patient seated in recliner with [name] present in room.   Session #2 Patient missed 30 minutes of skilled occupational therapy secondary to refusal. Patient stated he was tired and needed to rest. RN aware.   Haili Donofrio 10/17/2012, 10:52 AM

## 2012-10-17 NOTE — Progress Notes (Signed)
1000 Pt. Given 450 cc of contrast solution via peg tube.  Site continuing to leak clear fluid from site.  CT scheduled at noon.    1005 IV site leaking fluid-fluids discontinued; removed and iv team notified to place new line. Continuing to monitor.
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
## 2012-10-17 NOTE — Progress Notes (Signed)
Received report Peg tube still leaking. On call MD  Plotnikov notified. New order: hold all medication  via peg tube for tonight. D5 0.2 NACL infusing @ 75 ml/hr. Will continue to monitor frequently.
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
## 2012-10-17 NOTE — Progress Notes (Signed)
BP 110/50  Pulse 40  Temp 97.5 F (36.4 C) (Oral)  Resp 17  Ht 5' 8.9" (1.75 m)  Wt 125 lb 10.6 oz (57 kg)  BMI 18.61 kg/m2  SpO2 95% Continues to have leakage around G-tube site. Tube is intact and balloon up. Contrast given via tube and CT scan ordered. Will await results. Consider short term TPN/PPN and allow G-tube time to seal around tract. Also consider eval for potential high residuals/gastroparesis.

## 2012-10-17 NOTE — Progress Notes (Signed)
Subjective/Complaints: TF site draining again when feedings resumed. Brownish, some times purulent appearing  A 12 point review of systems has been performed and if not noted above is otherwise negative.  Objective: Vital Signs: Blood pressure 128/64, pulse 68, temperature 97.5 F (36.4 C), temperature source Oral, resp. rate 18, height 5' 8.9" (1.75 m), weight 54.2 kg (119 lb 7.8 oz), SpO2 95.00%. No results found. No results found for this basename: WBC:2,HGB:2,HCT:2,PLT:2 in the last 72 hours No results found for this basename: NA:2,K:2,CL:2,CO2:2,GLUCOSE:2,BUN:2,CREATININE:2,CALCIUM:2 in the last 72 hours CBG (last 3)   Basename 10/17/12 0419 10/17/12 0008 10/16/12 2030  GLUCAP 113* 128* 90    Wt Readings from Last 3 Encounters:  10/15/12 54.2 kg (119 lb 7.8 oz)  10/02/12 56.4 kg (124 lb 5.4 oz)  10/02/12 56.4 kg (124 lb 5.4 oz)    Physical Exam:  Constitutional:  Patient with poor dental dentition  HENT:  Head: Normocephalic.  edentulous  Eyes:  Pupils round and reactive to light  Neck:  Cervical collar intact  Cardiovascular: Normal rate and regular rhythm.  Pulmonary/Chest: a few basilar crackles less rhonchi.  He has no wheezes. No distress  Abdominal: Soft. Bowel sounds are normal. He exhibits no distension. Tender at g tube site. Drainage brown, sometimes greenish in color.  Musculoskeletal: He exhibits no edema. Some pain at right flank, into r iliac crest and left shoulder Neurological: He is alert.  Reasonable insight and awareness. Was joking with me on occasion. Intermittent confusion today Patient made good eye contact with examiner. LUE and BUE are 3 to 3+/5.  LE's are grossly 3+ to 4/5. Sensation slightly diminished left greater than right upper extremities. Speech clearer. Voice is stronger. Skin:     Assessment/Plan: 1. Functional deficits secondary to C3-4 fx with central cord injury which require 3+ hours per day of interdisciplinary therapy in a  comprehensive inpatient rehab setting. Physiatrist is providing close team supervision and 24 hour management of active medical problems listed below. Physiatrist and rehab team continue to assess barriers to discharge/monitor patient progress toward functional and medical goals. FIM: FIM - Bathing Bathing Steps Patient Completed: Chest;Left Arm;Abdomen;Right upper leg;Left upper leg Bathing: 3: Mod-Patient completes 5-7 28f 10 parts or 50-74%  FIM - Upper Body Dressing/Undressing Upper body dressing/undressing: 1: Total-Patient completed less than 25% of tasks FIM - Lower Body Dressing/Undressing Lower body dressing/undressing: 1: Total-Patient completed less than 25% of tasks  FIM - Hotel manager Devices: Grab bar or rail for support Toileting: 0: Activity did not occur  FIM - Diplomatic Services operational officer Devices: Grab bars;Walker Event organiser: 0-Activity did not occur  FIM - Banker Devices: Therapist, occupational: 4: Supine > Sit: Min A (steadying Pt. > 75%/lift 1 leg);5: Sit > Supine: Supervision (verbal cues/safety issues);4: Bed > Chair or W/C: Min A (steadying Pt. > 75%);4: Chair or W/C > Bed: Min A (steadying Pt. > 75%)  FIM - Locomotion: Wheelchair Locomotion: Wheelchair: 1: Total Assistance/staff pushes wheelchair (Pt<25%) FIM - Locomotion: Ambulation Locomotion: Ambulation Assistive Devices: Designer, industrial/product Ambulation/Gait Assistance: 4: Min assist Locomotion: Ambulation: 2: Travels 50 - 149 ft with minimal assistance (Pt.>75%)  Comprehension Comprehension Mode: Auditory Comprehension: 5-Understands basic 90% of the time/requires cueing < 10% of the time  Expression Expression Mode: Verbal Expression: 5-Expresses basic 90% of the time/requires cueing < 10% of the time.  Social Interaction Social Interaction: 6-Interacts appropriately with others with medication or extra time  (anti-anxiety, antidepressant).  Problem  Solving Problem Solving: 3-Solves basic 50 - 74% of the time/requires cueing 25 - 49% of the time  Memory Memory: 5-Recognizes or recalls 90% of the time/requires cueing < 10% of the time  Medical Problem List and Plan:  1. 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 DVT Prophylaxis/Anticoagulation: SCDs. Monitor for any signs of DVT  3. Pain Management: Tylenol and robaxin prn , add sports cream for painful areas 4. Mood/delirium/acute encephalopathy.    -as a whole better, still some hs confusion 5. Neuropsych: This patient is not capable of making decisions on his/her own behalf.  6. Dysphagia. Status post gastrostomy tube placement, tube was accidentally removed when it was caught in the bed yesterday. A replacment tube with balloon rentention was placed  -site drained again when feedings resumed    -will order CT of the abdomen with contrast.  -continue dressing changes as needed, TF held for now  -?J-tube  -ice chips trialed yesterday with SLP 7. Aspiration pneumonia/ respiratory failure. -see below 8. Hypothyroidism. Synthroid  9. Diabetes mellitus. Check blood sugars every 4 hours while on tube feeds. lantus insulin increased to 20 units q12 . Patient was on tradgenta 5 mg daily prior to admission 10. Leukocytosis: aspiration pneumonia  -abx completed. Recheck today  -IS, FV, OOB routinely with therapy  -afebrile, sats are stable, still with cough and secretions at times  -cxr shows improvement, his voice is stronger  LOS (Days) 14 A FACE TO FACE EVALUATION WAS PERFORMED  Malynda Smolinski T 10/17/2012, 6:49 AM

## 2012-10-17 NOTE — Progress Notes (Signed)
Speech Language Pathology Daily Session Note  Patient Details  Name: [name] MRN: [id number] Date of Birth: July 22, 1931  Today's Date: 10/17/2012 Time: 1191-4782 Time Calculation (min): 40 min  Short Term Goals: Week 2: SLP Short Term Goal 1 (Week 2): Pt will demonstrate functional problem solving for functional tasks with Mod A SLP Short Term Goal 2 (Week 2): Pt will demonstrate selective attention to task for 10 minutes with Mod A verbal cues for redirection SLP Short Term Goal 3 (Week 2): Pt will increased vocal intensity to increase overall speech intelligibility with Min A verbal cues.  SLP Short Term Goal 4 (Week 2): Pt will demonstrate use of laryngeal closure techniques (eg. supraglottic swallow, glottal closure) in an effort to assist with airway protection during functional swallowing with max assist  Skilled Therapeutic Interventions: Treatment focus on cognitive goals. Spoke with pulmonology PA who recommended to "hold off" on dysphagia treatment until pt's overall strength improves. Discussed treatment plan with pt and his caregiver "[name]" who verbalized understanding. Treatment focus on functional problem solving and working memory. Pt required supervision verbal cues for basic money management (counting money and making change) and medication management and demonstrated recall of new, daily information with supervision question cues.    FIM:  Comprehension Comprehension: 5-Follows basic conversation/direction: With extra time/assistive device Expression Expression Mode: Verbal Expression: 5-Expresses basic 90% of the time/requires cueing < 10% of the time. Social Interaction Social Interaction: 6-Interacts appropriately with others with medication or extra time (anti-anxiety, antidepressant). Problem Solving Problem Solving: 5-Solves basic 90% of the time/requires cueing < 10% of the time Memory Memory: 5-Recognizes or recalls 90% of the time/requires cueing < 10% of  the time  Pain Pain Assessment Pain Assessment: No/denies pain Pain Score:   2  Therapy/Group: Individual Therapy  [name] 10/17/2012, 12:49 PM

## 2012-10-17 NOTE — Progress Notes (Signed)
Peripherally Inserted Central Catheter/Midline Placement  The IV Nurse has discussed with the patient and/or persons authorized to consent for the patient, the purpose of this procedure and the potential benefits and risks involved with this procedure.  The benefits include less needle sticks, lab draws from the catheter and patient may be discharged home with the catheter.  Risks include, but not limited to, infection, bleeding, blood clot (thrombus formation), and puncture of an artery; nerve damage and irregular heat beat.  Alternatives to this procedure were also discussed.  PICC/Midline Placement Documentation        Timmothy Sours 10/17/2012, 5:03 PM

## 2012-10-17 NOTE — Progress Notes (Signed)
Nutrition Follow-up  Intervention:   1. TPN per pharmacy 2. Discontinue Prostat (TF order already discontinued this AM) 3. Once medically able, resume TF of Osmolite 1.5 at 25 ml/hr x 20 hours with 30 ml Prostat per tube daily. Advance formula by 10 ml/hr every 8 hours, or as tolerated, to goal of 65 ml/hr. 4. RD to continue to follow nutrition care plan  Assessment:   Noted that PEG tube was leaking after feedings were resumed. TF was held. Per MD, ? Need for J-tube. Radiology saw pt today and recommended TPN/PPN to allow G-tube to seal given pt continues to have leakage around G-tube site.  Discussed need for nutrition support with RN Judeth Cornfield and PA Jesusita Oka. PA discussed with MD Hermelinda Medicus need for initiation of TPN. This RD contacted TPN pharmacist to discuss situation with her.  Diet Order:  NPO  TF: none ordered  Free water: 200 ml five times daily  Meds: Scheduled Meds:    . albuterol  2.5 mg Nebulization TID  . antiseptic oral rinse  15 mL Mouth Rinse QID  . aspirin  81 mg Per Tube Daily  . budesonide  0.5 mg Nebulization BID  . feeding supplement  30 mL Per Tube Daily  . folic acid  1 mg Per Tube Daily  . free water  200 mL Per Tube 5 X Daily  . insulin aspart  0-5 Units Subcutaneous QHS  . insulin aspart  0-9 Units Subcutaneous Q4H  . insulin glargine  10 Units Subcutaneous BID  . iohexol  20 mL Oral Q1 Hr x 2  . ipratropium  0.5 mg Nebulization TID  . levothyroxine  88 mcg Per Tube QAC breakfast  . metoCLOPramide (REGLAN) injection  5 mg Intravenous Q8H  . Muscle Rub   Topical TID  . pantoprazole sodium  40 mg Per Tube BID  . predniSONE  10 mg Oral Q breakfast  . predniSONE  20 mg Oral Q breakfast  . predniSONE  30 mg Per Tube Q breakfast  . QUEtiapine  50 mg Oral QHS  . thiamine  100 mg Per Tube Daily  . DISCONTD: predniSONE  40 mg Per Tube Q breakfast   Continuous Infusions:    . dextrose 5 % and 0.2 % NaCl 75 mL/hr at 10/17/12 1400  . DISCONTD: feeding  supplement (OSMOLITE 1.5 CAL) Stopped (10/15/12 0815)   PRN Meds:.acetaminophen, acetaminophen, albuterol, iohexol, LORazepam, methocarbamol, ondansetron (ZOFRAN) IV, ondansetron, polyethylene glycol, sodium chloride, sorbitol, zolpidem  Labs:  CMP     Component Value Date/Time   NA 131* 10/17/2012 0850   K 4.1 10/17/2012 0850   CL 94* 10/17/2012 0850   CO2 24 10/17/2012 0850   GLUCOSE 96 10/17/2012 0850   BUN 25* 10/17/2012 0850   CREATININE 0.60 10/17/2012 0850   CALCIUM 8.5 10/17/2012 0850   PROT 6.4 10/06/2012 0545   ALBUMIN 2.0* 10/06/2012 0545   AST 97* 10/06/2012 0545   ALT 50 10/06/2012 0545   ALKPHOS 159* 10/06/2012 0545   BILITOT 0.7 10/06/2012 0545   GFRNONAA >90 10/17/2012 0850   GFRAA >90 10/17/2012 0850   CBG (last 3)   Basename 10/17/12 1152 10/17/12 0419 10/17/12 0008  GLUCAP 124* 113* 128*     Intake/Output Summary (Last 24 hours) at 10/17/12 1437 Last data filed at 10/17/12 1300  Gross per 24 hour  Intake      0 ml  Output   1400 ml  Net  -1400 ml  BM 10/17  Weight Status:  57 kg/125 lb (10/18) - trending up  Body mass index is 18.61 kg/(m^2). WNL  Re-estimated needs:  1900 - 2100 kcal, 85-100 grams protein  Nutrition Dx:  Swallowing difficulty r/t cervical fracture AEB PEG tube required for nutrition. Ongoing.  Goal:  Meet 90-100% estimated nutrition needs with EN - unmet 2/2 TF on hold  Monitor:  TF re-initiation, weight trend, labs, TPN prescription  Jarold Motto MS, RD, LDN Pager: [phone] After-hours pager: [phone]

## 2012-10-18 ENCOUNTER — Inpatient Hospital Stay (HOSPITAL_COMMUNITY): Payer: PRIVATE HEALTH INSURANCE | Admitting: Speech Pathology

## 2012-10-18 ENCOUNTER — Inpatient Hospital Stay (HOSPITAL_COMMUNITY): Payer: PRIVATE HEALTH INSURANCE | Admitting: Physical Therapy

## 2012-10-18 ENCOUNTER — Inpatient Hospital Stay (HOSPITAL_COMMUNITY): Payer: PRIVATE HEALTH INSURANCE

## 2012-10-18 LAB — GLUCOSE, CAPILLARY
Glucose-Capillary: 121 mg/dL — ABNORMAL HIGH (ref 70–99)
Glucose-Capillary: 124 mg/dL — ABNORMAL HIGH (ref 70–99)
Glucose-Capillary: 201 mg/dL — ABNORMAL HIGH (ref 70–99)

## 2012-10-18 LAB — PREALBUMIN: Prealbumin: 14 mg/dL — ABNORMAL LOW (ref 17.0–34.0)

## 2012-10-18 LAB — COMPREHENSIVE METABOLIC PANEL
ALT: 51 U/L (ref 0–53)
AST: 54 U/L — ABNORMAL HIGH (ref 0–37)
CO2: 32 mEq/L (ref 19–32)
Calcium: 8.4 mg/dL (ref 8.4–10.5)
GFR calc non Af Amer: >90 mL/min (ref >90)
Potassium: 3.9 mEq/L (ref 3.5–5.1)
Sodium: 132 mEq/L — ABNORMAL LOW (ref 135–145)

## 2012-10-18 LAB — CHOLESTEROL, TOTAL: Cholesterol: 105 mg/dL (ref 0–200)

## 2012-10-18 LAB — TRIGLYCERIDES: Triglycerides: 102 mg/dL (ref <150)

## 2012-10-18 LAB — PHOSPHORUS: Phosphorus: 2.5 mg/dL (ref 2.3–4.6)

## 2012-10-18 LAB — MAGNESIUM: Magnesium: 1.6 mg/dL (ref 1.5–2.5)

## 2012-10-18 MED ORDER — MAGNESIUM SULFATE 40 MG/ML IJ SOLN
2.0000 g | Freq: Once | INTRAMUSCULAR | Status: AC
  Administered 2012-10-18: 2 g via INTRAVENOUS
  Filled 2012-10-18: qty 50

## 2012-10-18 MED ORDER — ALBUTEROL SULFATE (5 MG/ML) 0.5% IN NEBU
2.5000 mg | INHALATION_SOLUTION | Freq: Two times a day (BID) | RESPIRATORY_TRACT | Status: DC
  Administered 2012-10-18 – 2012-11-03 (×25): 2.5 mg via RESPIRATORY_TRACT
  Filled 2012-10-18 (×28): qty 0.5

## 2012-10-18 MED ORDER — IPRATROPIUM BROMIDE 0.02 % IN SOLN
0.5000 mg | Freq: Two times a day (BID) | RESPIRATORY_TRACT | Status: DC
  Administered 2012-10-18 – 2012-11-03 (×25): 0.5 mg via RESPIRATORY_TRACT
  Filled 2012-10-18 (×30): qty 2.5

## 2012-10-18 MED ORDER — CLINIMIX E/DEXTROSE (5/15) 5 % IV SOLN
INTRAVENOUS | Status: AC
  Administered 2012-10-18: 18:00:00 via INTRAVENOUS
  Filled 2012-10-18: qty 1000

## 2012-10-18 MED ORDER — SODIUM CHLORIDE 0.9 % IV SOLN: INTRAVENOUS | Status: AC

## 2012-10-18 NOTE — Progress Notes (Signed)
Physical Therapy Note  Patient Details  Name: [name] MRN: [id number] Date of Birth: 03-22-1931 Today's Date: 10/18/2012  4098-1191 (30 minutes) individual Pain: 8/10 (cervical) /premedicated Focus of treatment: gait training/endurance Treatment: supine to side (left ) to sit min assist; sit to stand min to SBA; gait 100 feet RW min assist for safety only + assist with oxygen (2L [place])/ IV followed by wc for  Safety. Unable to obtain Oxygen sats.   Holliday Sheaffer,JIM 10/18/2012, 8:06 AM

## 2012-10-18 NOTE — Progress Notes (Signed)
Patient ID: [name], male   DOB: 1931-04-29, 78 y.o.   MRN: [id number] Subjective/Complaints: 10/19.  Patient to start TPN today-central line placed.  Chest x-ray reveals a slightly more prominent infiltrate right lobe. Patient remains afebrile; s/p post op RF and HAP HEENT-nasal cannula oxygen in place; chest breath sounds diminished but clear; prevascular heart rate slow and regular; abdomen status post PEG; soft nontender; Extr- L SCD in place  CBG (last 3)   Basename 10/18/12 0804 10/18/12 0426 10/17/12 2356  GLUCAP 142* 124* 145*     A 12 point review of systems has been performed and if not noted above is otherwise negative.  Objective: Vital Signs: Blood pressure 133/67, pulse 58, temperature 97.6 F (36.4 C), temperature source Oral, resp. rate 19, height 5' 8.9" (1.75 m), weight 57 kg (125 lb 10.6 oz), SpO2 96.00%. Ct Abdomen W Contrast  10/17/2012  *RADIOLOGY REPORT*  Clinical Data: Draining G-tube site.  Evaluate for underlying abscess.  CT ABDOMEN WITH CONTRAST  Technique:  Multidetector CT imaging of the abdomen was performed following the standard protocol during bolus administration of intravenous contrast.  Contrast: 80mL OMNIPAQUE IOHEXOL 300 MG/ML  SOLN  Comparison: CT of the abdomen and pelvis 11/23/2010.  Findings:  Lung Bases: Extensive bronchial wall thickening and peribronchovascular micro- and macronodularity in the right lower lobe, as well as some consolidative air space disease in the medial aspect of the right lower lobe, concerning for sequela of aspiration.  Similar findings are present to a much lesser extent in the posterior aspect of the left lower lobe.  Abdomen: A percutaneous enterogastric tube is noted.  The retention balloon on the tip of the tube is properly located within the gastric lumen in the proximal body.  No well-defined rim enhancing abnormal fluid collections are noted along the course of the catheter to suggest the presence of a abscess.   There is a small locule of gas adjacent to the catheter within the anterior abdominal wall, and a trace amount of fluid; this is nonspecific and within normal limits for a recently replaced catheter.  Status post right hemihepatectomy and cholecystectomy.  Within the remaining liver there are no focal cystic or solid hepatic lesions. The appearance of the pancreas and bilateral adrenal glands is unremarkable.  In the renal hila bilaterally and there are small calcifications measuring 4 mm suspicious for nonobstructive calculi.  The previously noted exophytic cysts in the upper pole of the left kidney has decreased in size, currently measuring only 12 mm; this lesion measures approximately 50 HU, suggesting proteinaceous or hemorrhagic contents.  There is an additional 12 mm cyst in the superior aspect of the left kidney.  Chronic parenchymal atrophy and scarring in the lower pole of the left kidney is similar to the prior examination.  There is a new area of subcapsular low attenuation and focal linear low attenuation region in the medial aspect of the spleen, which may represent sequela of recent trauma (i.e., splenic laceration with resolving subcapsular hematoma).  Extensive atherosclerosis of the visualized abdominal vasculature.  Within the visualized abdomen there is no ascites, pneumoperitoneum or pathologic distension of small bowel.  Musculoskeletal: There are no aggressive appearing lytic or blastic lesions noted in the visualized portions of the skeleton.  IMPRESSION:  1.  No evidence of abscess associated with the percutaneous enterogastric tube.  There is a tiny locule of gas and a trace amount of fluid adjacent to the tube within the anterior abdominal wall, which is within  normal limits for a recently replaced catheter. 2.  Findings suggestive of aspiration pneumonia in the right lower lobe and to a lesser extent and posterior aspect of the left lower lobe.  Clinical correlation is recommended. 3.   The appearance of the spleen suggests sequelae of recent trauma with a resolving splenic laceration and subcapsular hematoma. 4.  Status post right hemihepatectomy.  No suspicious appearing hepatic lesion in the left hepatic lobe. 5.  Interval decrease in size of a small high attenuation cystic lesion extending exophytically from the upper pole of the left kidney.  This is favored to represent a proteinaceous or hemorrhagic cyst that has undergone partial involution, however, strictly speaking, an enhancing cystic renal neoplasm is difficult to entirely exclude.  This could be further evaluated with non emergent renal ultrasound to exclude internal blood flow. 6.  Extensive atherosclerosis. 7.  Probable nonobstructive calculi within the collecting systems of the kidneys bilaterally, as above.   Original Report Authenticated By: Florencia Reasons, M.D.    Dg Chest Port 1 View  10/18/2012  *RADIOLOGY REPORT*  Clinical Data: Right lower lobe infiltrate.  PORTABLE CHEST - 1 VIEW  Comparison: 10/19 and 10/07/2012  Findings: PICC tip is in the superior vena cava in good position. Heart size and vascularity are normal and the left lung is clear. The patchy infiltrate in the right lower lobe appears slightly more prominent than on the prior exam.  No effusions.  IMPRESSION: Patchy infiltrate in the right lower lobe appears slightly more prominent.   Original Report Authenticated By: Gwynn Burly, M.D.    Dg Chest Port 1 View  10/17/2012  *RADIOLOGY REPORT*  Clinical Data: PICC line placement  PORTABLE CHEST - 1 VIEW  Comparison: 10/12/2012  Findings: Cardiomediastinal silhouette is stable.  Persistent patchy infiltrate right infrahilar region.  There is right arm PICC line with tip in SVC.  No diagnostic pneumothorax.  IMPRESSION: Right arm PICC line with tip in SVC.  No diagnostic pneumothorax. Persistent patchy infiltrate right infrahilar region.   Original Report Authenticated By: Natasha Mead, M.D.      Basename 10/17/12 0850 10/17/12 0620  WBC 12.4* 12.1*  HGB 13.0 12.6*  HCT 37.8* 36.3*  PLT 216 250    Basename 10/18/12 0505 10/17/12 0850  NA 132* 131*  K 3.9 4.1  CL 95* 94*  CO2 32 24  GLUCOSE 145* 96  BUN 21 25*  CREATININE 0.62 0.60  CALCIUM 8.4 8.5   CBG (last 3)   Basename 10/18/12 0804 10/18/12 0426 10/17/12 2356  GLUCAP 142* 124* 145*    Wt Readings from Last 3 Encounters:  10/17/12 57 kg (125 lb 10.6 oz)  10/02/12 56.4 kg (124 lb 5.4 oz)  10/02/12 56.4 kg (124 lb 5.4 oz)    Physical Exam:  Constitutional:  Patient with poor dental dentition  HENT:  Head: Normocephalic.  edentulous  Eyes:  Pupils round and reactive to light  Neck:  Cervical collar intact  Cardiovascular: Normal rate and regular rhythm.  Pulmonary/Chest: a few basilar crackles less rhonchi.  He has no wheezes. No distress  Abdominal: Soft. Bowel sounds are normal. He exhibits no distension. Tender at g tube site. Drainage brown, sometimes greenish in color.  Musculoskeletal: He exhibits no edema. Some pain at right flank, into r iliac crest and left shoulder Neurological: He is alert.  Reasonable insight and awareness. Was joking with me on occasion. Intermittent confusion today Patient made good eye contact with examiner. LUE and BUE are  3 to 3+/5.  LE's are grossly 3+ to 4/5. Sensation slightly diminished left greater than right upper extremities. Speech clearer. Voice is stronger. Skin:     Assessment/Plan: 1. Functional deficits secondary to C3-4 fx with central cord injury which require 3+ hours per day of interdisciplinary therapy in a comprehensive inpatient rehab setting. Physiatrist is providing close team supervision and 24 hour management of active medical problems listed below. Physiatrist and rehab team continue to assess barriers to discharge/monitor patient progress toward functional and medical goals. FIM: FIM - Bathing Bathing Steps Patient Completed: Chest;Left  Arm;Abdomen;Right upper leg;Left upper leg Bathing: 3: Mod-Patient completes 5-7 54f 10 parts or 50-74%  FIM - Upper Body Dressing/Undressing Upper body dressing/undressing: 1: Total-Patient completed less than 25% of tasks FIM - Lower Body Dressing/Undressing Lower body dressing/undressing: 1: Total-Patient completed less than 25% of tasks  FIM - Hotel manager Devices: Grab bar or rail for support Toileting: 0: Activity did not occur  FIM - Diplomatic Services operational officer Devices: Grab bars;Walker Pensions consultant Transfers: 0-Activity did not occur  FIM - Banker Devices: Therapist, occupational: 6: More than reasonable amt of time;6: Sit > Supine: No assist;3: Supine > Sit: Mod A (lifting assist/Pt. 50-74%/lift 2 legs;4: Bed > Chair or W/C: Min A (steadying Pt. > 75%);4: Chair or W/C > Bed: Min A (steadying Pt. > 75%)  FIM - Locomotion: Wheelchair Locomotion: Wheelchair: 1: Total Assistance/staff pushes wheelchair (Pt<25%) FIM - Locomotion: Ambulation Locomotion: Ambulation Assistive Devices: Designer, industrial/product Ambulation/Gait Assistance: 4: Min assist Locomotion: Ambulation: 4: Travels 150 ft or more with minimal assistance (Pt.>75%)  Comprehension Comprehension Mode: Auditory Comprehension: 5-Follows basic conversation/direction: With extra time/assistive device  Expression Expression Mode: Verbal Expression: 5-Expresses basic 90% of the time/requires cueing < 10% of the time.  Social Interaction Social Interaction: 6-Interacts appropriately with others with medication or extra time (anti-anxiety, antidepressant).  Problem Solving Problem Solving: 5-Solves basic 90% of the time/requires cueing < 10% of the time  Memory Memory: 5-Recognizes or recalls 90% of the time/requires cueing < 10% of the time  Medical Problem List and Plan:  1. C3-4 fracture status post fusion with central cord syndrome, likely mild TBI    2. DVT Prophylaxis/Anticoagulation: SCDs. Monitor for any signs of DVT  3. Pain Management: Tylenol and robaxin prn , add sports cream for painful areas 4. Mood/delirium/acute encephalopathy.    -as a whole better, still some hs confusion 5. Neuropsych: This patient is not capable of making decisions on his/her own behalf.  6. Dysphagia. Status post gastrostomy tube placement, tube was accidentally removed when it was caught in the bed yesterday. A replacment tube with balloon rentention was placed  -site drained again when feedings resumed    -will order CT of the abdomen with contrast.  -continue dressing changes as needed, TF held for now  -?J-tube  -ice chips trialed yesterday with SLP 7. Aspiration pneumonia/ respiratory failure. -see below 8. Hypothyroidism. Synthroid  9. Diabetes mellitus. Check blood sugars every 4 hours while on tube feeds. lantus insulin increased to 20 units q12 . Patient was on tradgenta 5 mg daily prior to admission 10. Leukocytosis: aspiration pneumonia  -abx completed. Recheck today  -IS, FV, OOB routinely with therapy  -afebrile, sats are stable, still with cough and secretions at times  -cxr shows improvement, his voice is stronger  LOS (Days) 15 A FACE TO FACE EVALUATION WAS PERFORMED  Rogelia Boga 10/18/2012, 8:51 AM
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
## 2012-10-19 ENCOUNTER — Inpatient Hospital Stay (HOSPITAL_COMMUNITY): Payer: PRIVATE HEALTH INSURANCE | Admitting: *Deleted

## 2012-10-19 LAB — GLUCOSE, CAPILLARY
Glucose-Capillary: 171 mg/dL — ABNORMAL HIGH (ref 70–99)
Glucose-Capillary: 193 mg/dL — ABNORMAL HIGH (ref 70–99)
Glucose-Capillary: 176 mg/dL — ABNORMAL HIGH (ref 70–99)
Glucose-Capillary: 210 mg/dL — ABNORMAL HIGH (ref 70–99)

## 2012-10-19 LAB — BASIC METABOLIC PANEL
CO2: 31 mEq/L (ref 19–32)
Calcium: 8.2 mg/dL — ABNORMAL LOW (ref 8.4–10.5)
Creatinine, Ser: 0.54 mg/dL (ref 0.50–1.35)
GFR calc non Af Amer: >90 mL/min (ref >90)
Glucose, Bld: 151 mg/dL — ABNORMAL HIGH (ref 70–99)

## 2012-10-19 MED ORDER — SODIUM CHLORIDE 0.9 % IV SOLN
INTRAVENOUS | Status: DC
  Administered 2012-10-20: 01:00:00 via INTRAVENOUS

## 2012-10-19 MED ORDER — INSULIN ASPART 100 UNIT/ML ~~LOC~~ SOLN
0.0000 [IU] | SUBCUTANEOUS | Status: DC
  Administered 2012-10-19: 3 [IU] via SUBCUTANEOUS
  Administered 2012-10-19: 2 [IU] via SUBCUTANEOUS
  Administered 2012-10-19: 3 [IU] via SUBCUTANEOUS
  Administered 2012-10-19 – 2012-10-20 (×3): 2 [IU] via SUBCUTANEOUS
  Administered 2012-10-20: 06:00:00 via SUBCUTANEOUS
  Administered 2012-10-20: 3 [IU] via SUBCUTANEOUS
  Administered 2012-10-20: 2 [IU] via SUBCUTANEOUS
  Administered 2012-10-20 – 2012-10-21 (×2): 3 [IU] via SUBCUTANEOUS
  Administered 2012-10-21 (×3): 2 [IU] via SUBCUTANEOUS
  Administered 2012-10-21: 3 [IU] via SUBCUTANEOUS
  Administered 2012-10-21: 1 [IU] via SUBCUTANEOUS
  Administered 2012-10-22: 2 [IU] via SUBCUTANEOUS
  Administered 2012-10-22: 5 [IU] via SUBCUTANEOUS
  Administered 2012-10-22 (×2): 2 [IU] via SUBCUTANEOUS
  Administered 2012-10-22: 1 [IU] via SUBCUTANEOUS
  Administered 2012-10-23 (×6): 2 [IU] via SUBCUTANEOUS
  Administered 2012-10-24 – 2012-10-25 (×5): 1 [IU] via SUBCUTANEOUS
  Administered 2012-10-25 (×4): 2 [IU] via SUBCUTANEOUS
  Administered 2012-10-26: 1 [IU] via SUBCUTANEOUS
  Administered 2012-10-26: 2 [IU] via SUBCUTANEOUS
  Administered 2012-10-26 – 2012-10-27 (×5): 1 [IU] via SUBCUTANEOUS
  Administered 2012-10-27 – 2012-10-28 (×2): 2 [IU] via SUBCUTANEOUS
  Administered 2012-10-28 – 2012-10-29 (×2): 3 [IU] via SUBCUTANEOUS
  Administered 2012-10-29: 2 [IU] via SUBCUTANEOUS
  Administered 2012-10-29 (×2): 1 [IU] via SUBCUTANEOUS
  Administered 2012-10-29 – 2012-10-30 (×4): 2 [IU] via SUBCUTANEOUS
  Administered 2012-10-30: 1 [IU] via SUBCUTANEOUS
  Administered 2012-10-30 (×2): 2 [IU] via SUBCUTANEOUS
  Administered 2012-10-31: 1 [IU] via SUBCUTANEOUS
  Administered 2012-10-31: 2 [IU] via SUBCUTANEOUS
  Administered 2012-11-01: 3 [IU] via SUBCUTANEOUS
  Administered 2012-11-01 (×2): 1 [IU] via SUBCUTANEOUS
  Administered 2012-11-01 (×3): 2 [IU] via SUBCUTANEOUS
  Administered 2012-11-02: 1 [IU] via SUBCUTANEOUS
  Administered 2012-11-02: 3 [IU] via SUBCUTANEOUS
  Administered 2012-11-02 (×3): 2 [IU] via SUBCUTANEOUS
  Administered 2012-11-02 – 2012-11-03 (×2): 1 [IU] via SUBCUTANEOUS
  Administered 2012-11-03: 2 [IU] via SUBCUTANEOUS

## 2012-10-19 MED ORDER — MAGNESIUM SULFATE 40 MG/ML IJ SOLN
2.0000 g | Freq: Once | INTRAMUSCULAR | Status: AC
  Administered 2012-10-19: 2 g via INTRAVENOUS
  Filled 2012-10-19: qty 50

## 2012-10-19 MED ORDER — SODIUM CHLORIDE 0.9 % IV SOLN
20.0000 mmol | Freq: Once | INTRAVENOUS | Status: AC
  Administered 2012-10-19: 20 mmol via INTRAVENOUS
  Filled 2012-10-19: qty 20

## 2012-10-19 MED ORDER — POTASSIUM CHLORIDE 10 MEQ/50ML IV SOLN
10.0000 meq | INTRAVENOUS | Status: AC
  Administered 2012-10-19 (×4): 10 meq via INTRAVENOUS
  Filled 2012-10-19 (×4): qty 50

## 2012-10-19 MED ORDER — CLINIMIX E/DEXTROSE (5/20) 5 % IV SOLN
INTRAVENOUS | Status: AC
  Administered 2012-10-19: 17:00:00 via INTRAVENOUS
  Filled 2012-10-19: qty 2000
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
## 2012-10-19 NOTE — Progress Notes (Deleted)
Patient noted to be confused this AM.  Alert and oriented to person and place.  Attempting to get OOB to go see his wife. Persuaded to stay in bed without difficulty.  Bed alarm in use with side rail X 3.  Continue to monitor.
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
## 2012-10-19 NOTE — Progress Notes (Signed)
Occupational Therapy Note  Patient Details  Name: TAKAHIRO GODINHO MRN: [id number] Date of Birth: Dec 07, 1931 Today's Date: 10/19/2012 Individual session Pain:  3/10 0730  Pt lying in bed in dark.  Pt stated he had not slept all night and did not want to do therapy now.  After much encouragement, pt refused to get up.    Humberto Seals 10/19/2012, 6:13 PM

## 2012-10-20 ENCOUNTER — Inpatient Hospital Stay (HOSPITAL_COMMUNITY): Payer: PRIVATE HEALTH INSURANCE | Admitting: Occupational Therapy

## 2012-10-20 ENCOUNTER — Inpatient Hospital Stay (HOSPITAL_COMMUNITY): Payer: PRIVATE HEALTH INSURANCE | Admitting: Speech Pathology

## 2012-10-20 ENCOUNTER — Inpatient Hospital Stay (HOSPITAL_COMMUNITY): Payer: PRIVATE HEALTH INSURANCE

## 2012-10-20 LAB — COMPREHENSIVE METABOLIC PANEL
BUN: 19 mg/dL (ref 6–23)
CO2: 32 mEq/L (ref 19–32)
Chloride: 97 mEq/L (ref 96–112)
Creatinine, Ser: 0.55 mg/dL (ref 0.50–1.35)
GFR calc non Af Amer: >90 mL/min (ref >90)
Total Bilirubin: 0.7 mg/dL (ref 0.3–1.2)

## 2012-10-20 LAB — GLUCOSE, CAPILLARY

## 2012-10-20 MED ORDER — FAT EMULSION 20 % IV EMUL
250.0000 mL | INTRAVENOUS | Status: AC
  Administered 2012-10-20: 250 mL via INTRAVENOUS
  Filled 2012-10-20: qty 250

## 2012-10-20 MED ORDER — INSULIN GLARGINE 100 UNIT/ML ~~LOC~~ SOLN: 10.0000 [IU] | Freq: Two times a day (BID) | SUBCUTANEOUS | Status: DC

## 2012-10-20 MED ORDER — ZINC TRACE METAL 1 MG/ML IV SOLN
INTRAVENOUS | Status: AC
  Administered 2012-10-20: 17:00:00 via INTRAVENOUS
  Filled 2012-10-20: qty 2000

## 2012-10-20 MED ORDER — SODIUM GLYCEROPHOSPHATE 1 MMOLE/ML IV SOLN
20.0000 mmol | Freq: Once | INTRAVENOUS | Status: AC
  Administered 2012-10-20: 20 mmol via INTRAVENOUS
  Filled 2012-10-20: qty 20

## 2012-10-20 NOTE — Progress Notes (Signed)
Physical Therapy Session Note  Patient Details  Name: [name] MRN: [id number] Date of Birth: 1931-05-31  Today's Date: 10/20/2012 Time: 1300-1310 Time Calculation (min): 10 min  Pt found in supine, very talkative and initially verbalizing his need to mobilize in order to "make it until 77." (Pt's 81th birthday today), but then refusing to get OOB or participate in therapy session. He states he will do it tomorrow and would prefer to have one session in the morning and one in the later afternoon. Therapist offered to try to accommodate for this today, but pt refused stating he will start tomorrow. States today he didn't get enough sleep and "just doesn't feel like doing it."  Educated on importance of mobility and need to get OOB for pressure relief, as pt complaining on laying on his bottom causing him discomfort. Refused to get into w/c or recliner, demonstrated rolling side to side mod I in the bed.   Therapy Documentation Precautions:  Precautions Precautions: Fall;Cervical Precaution Comments: reviewed precautions with pt's girlfriend who will be his primary caretaker.   Required Braces or Orthoses: Cervical Brace Cervical Brace: Hard collar Restrictions Weight Bearing Restrictions: No General: Amount of Missed PT Time (min): 50 Minutes Missed Time Reason: Patient unwilling/refused to participate without medical reason Pain: Pain Assessment Pain Assessment: No/denies pain    See FIM for current functional status  Therapy/Group: Individual Therapy  Karolee Stamps [facility] 10/20/2012, 1:31 PM

## 2012-10-20 NOTE — Progress Notes (Signed)
Speech Language Pathology Daily Session Note  Patient Details  Name: [name] MRN: [id number] Date of Birth: [birth year]  Today's Date: 10/20/2012 Time: 1110-1150 Time Calculation (min): 40 min  Short Term Goals: Week 2: SLP Short Term Goal 1 (Week 2): Pt will demonstrate functional problem solving for functional tasks with Mod A SLP Short Term Goal 2 (Week 2): Pt will demonstrate selective attention to task for 10 minutes with Mod A verbal cues for redirection SLP Short Term Goal 3 (Week 2): Pt will increased vocal intensity to increase overall speech intelligibility with Min A verbal cues.  SLP Short Term Goal 4 (Week 2): Pt will demonstrate use of laryngeal closure techniques (eg. supraglottic swallow, glottal closure) in an effort to assist with airway protection during functional swallowing with max assist  Skilled Therapeutic Interventions: Treatment focus on cognitive goals. Pt demonstrates decreased short-term memory and asking questions about current swallowing function and goals which he has been educated about numerous times. Pt re-educated on clinician reasoning for why dysphagia goals have been discontinued. He verbalized understanding. Pt with increased anticipatory awareness for discharge planning and follow-up therapy.    FIM:  Comprehension Comprehension Mode: Auditory Comprehension: 5-Understands complex 90% of the time/Cues < 10% of the time Expression Expression Mode: Verbal Expression: 5-Expresses complex 90% of the time/cues < 10% of the time Social Interaction Social Interaction: 6-Interacts appropriately with others with medication or extra time (anti-anxiety, antidepressant). Problem Solving Problem Solving: 5-Solves basic problems: With no assist Memory Memory: 5-Recognizes or recalls 90% of the time/requires cueing < 10% of the time FIM - Eating Eating Activity: 1: Helper performs IV, parenteral, or tube feeding  Pain Pain Assessment Pain  Assessment: No/denies pain  Therapy/Group: Individual Therapy  Anajah Sterbenz 10/20/2012, 12:04 PM
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
## 2012-10-20 NOTE — Plan of Care (Signed)
Problem: RH Swallowing Goal: LTG Patient will consume least restrictive PO diet (SLP) LTG: Patient will consume least restrictive PO diet with assist for use of compensatory strategies (SLP)  Outcome: Not Applicable Date Met:  10/20/12 Pt's swallowing goals discharged at this time per MD.

## 2012-10-20 NOTE — Progress Notes (Signed)
Speech Language Pathology Weekly Progress Note  Patient Details  Name: [name] MRN: [id number] Date of Birth: 10-Jun-1931  Today's Date: 10/20/2012  Short Term Goals: Week 2: SLP Short Term Goal 1 (Week 2): Pt will demonstrate functional problem solving for functional tasks with Mod A SLP Short Term Goal 1 - Progress (Week 2): Met SLP Short Term Goal 2 (Week 2): Pt will demonstrate selective attention to task for 10 minutes with Mod A verbal cues for redirection SLP Short Term Goal 2 - Progress (Week 2): Met SLP Short Term Goal 3 (Week 2): Pt will increased vocal intensity to increase overall speech intelligibility with Min A verbal cues.  SLP Short Term Goal 3 - Progress (Week 2): Met SLP Short Term Goal 4 (Week 2): Pt will demonstrate use of laryngeal closure techniques (eg. supraglottic swallow, glottal closure) in an effort to assist with airway protection during functional swallowing with max assist SLP Short Term Goal 4 - Progress (Week 2): Discontinued (comment)  New Short Term Goals:  Week 3: SLP Short Term Goal 1 (Week 3): Pt will utilize extnernal memory aids to increase recall/carryover of newly learned information with Mod I.  SLP Short Term Goal 2 (Week 3): Pt will utilize increased vocal intensity and a slow rate to increase speech intelligibility at the sentence level to 100% intelligibility with Mod I.   Weekly Progress Updates: Pt has made functional gains and has met 3 out of 3 STG's  this reporting period. Currently, pt is overall supervision-Min A for functional problem solving, working memory, selective attention and anticipatory awareness. All swallowing goals have been discontinued due to MD orders. Pt education ongoing and pt would benefit from continued skilled SLP intervention to maximize cognitive function for discharge home.    SLP Frequency: 1-2 X/day, 30-60 minutes Estimated Length of Stay: 1 week SLP Treatment/Interventions: Cognitive  remediation/compensation;Internal/external aids;Cueing hierarchy;Functional tasks;Environmental controls;Therapeutic Activities;Speech/Language facilitation;Patient/family education  Daily Session FIM:  Comprehension Comprehension Mode: Auditory Comprehension: 5-Understands complex 90% of the time/Cues < 10% of the time Expression Expression Mode: Verbal Expression: 5-Expresses complex 90% of the time/cues < 10% of the time Social Interaction Social Interaction: 6-Interacts appropriately with others with medication or extra time (anti-anxiety, antidepressant). Problem Solving Problem Solving: 5-Solves basic 90% of the time/requires cueing < 10% of the time Memory Memory: 4-Recognizes or recalls 75 - 89% of the time/requires cueing 10 - 24% of the time Pain Pain Assessment Pain Assessment: No/denies pain  Therapy/Group: Individual Therapy  Nakiya Rallis 10/20/2012, 3:20 PM
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
## 2012-10-20 NOTE — Progress Notes (Signed)
Occupational Therapy Session Note  Patient Details  Name: [name] MRN: [id number] Date of Birth: 07-09-1931  Today's Date: 10/20/2012  Short Term Goals: Week 1:  OT Short Term Goal 1 (Week 1): Pt will sit EOB to don shirt with steady A OT Short Term Goal 1 - Progress (Week 1): Not met OT Short Term Goal 2 (Week 1): Pt will don shirt with mod A OT Short Term Goal 2 - Progress (Week 1): Not met OT Short Term Goal 3 (Week 1): Pt will transfer to toilet / BSC with mod A OT Short Term Goal 3 - Progress (Week 1): Met OT Short Term Goal 4 (Week 1): Pt and therapist will explore options for donning LB clothing with AE/ apaptive methods OT Short Term Goal 4 - Progress (Week 1): Not met OT Short Term Goal 5 (Week 1): Pt will be able to pull down pants for toileting with steady A OT Short Term Goal 5 - Progress (Week 1): Not met  Week 2:  OT Short Term Goal 1 (Week 2): Patient will tolerate being OOB, sitting in w/c or recliner, for at least an hour OT Short Term Goal 1 - Progress (Week 2): Met OT Short Term Goal 2 (Week 2): Patient will attempt to wash face using modified wash cloth, if needed, with set-up assistance OT Short Term Goal 2 - Progress (Week 2): Met OT Short Term Goal 3 (Week 2): Therapist will complete PROM exercises to bilateral shoulders, elbows, forearms, wrist, and fingers up to patients tolerance level at least 4times a week in order to stretch muscles and help with strenthening weak UEs OT Short Term Goal 3 - Progress (Week 2): Met OT Short Term Goal 4 (Week 2): Patient will perform BSC transfer with min assist OT Short Term Goal 4 - Progress (Week 2): Met  Week 3:  OT Short Term Goal 1 (Week 3): Short Term Goals = Long Term Goals  Skilled Therapeutic Interventions/Progress Updates:  [phone] - 77 Minutes No complaints of pain Patient found supine in bed with complaints of no sleep last night. Patient initially refused therapy, but with moderate encouragement was  willing to participate. Patient engaged in bed mobility with supervision and sat edge of bed to complete UB/LB dressing (patient refused bathing at this time). Patient consistently stated, "I can't do it" during dressing activity; mod assist for UB dressing & total assist for LB dressing. Focused skilled intervention on bed mobility, sit/stands, UB/LB dressing, functional use of bilateral UEs, overall activity tolerance/endurance, and edge of bed -> w/c stand pivot transfer. Performed minimal PROM -> LUE; patient with some complaints of pain during movement.  Left patient seated in w/c with quick release belt donned and call bell & phone left within reach.   Precautions:  Precautions Precautions: Fall;Cervical Precaution Comments: reviewed precautions with pt's girlfriend who will be his primary caretaker.   Required Braces or Orthoses: Cervical Brace Cervical Brace: Hard collar Restrictions Weight Bearing Restrictions: No  See FIM for current functional status  Therapy/Group: Individual Therapy  Bettejane Leavens 10/20/2012, 12:19 PM

## 2012-10-21 ENCOUNTER — Inpatient Hospital Stay (HOSPITAL_COMMUNITY): Payer: PRIVATE HEALTH INSURANCE

## 2012-10-21 ENCOUNTER — Inpatient Hospital Stay (HOSPITAL_COMMUNITY): Payer: PRIVATE HEALTH INSURANCE | Admitting: Occupational Therapy

## 2012-10-21 ENCOUNTER — Inpatient Hospital Stay (HOSPITAL_COMMUNITY): Payer: PRIVATE HEALTH INSURANCE | Admitting: Speech Pathology

## 2012-10-21 DIAGNOSIS — E119 Type 2 diabetes mellitus without complications: Secondary | ICD-10-CM

## 2012-10-21 DIAGNOSIS — S129XXA Fracture of neck, unspecified, initial encounter: Secondary | ICD-10-CM

## 2012-10-21 DIAGNOSIS — IMO0002 Reserved for concepts with insufficient information to code with codable children: Secondary | ICD-10-CM

## 2012-10-21 DIAGNOSIS — F1011 Alcohol abuse, in remission: Secondary | ICD-10-CM

## 2012-10-21 LAB — GLUCOSE, CAPILLARY
Glucose-Capillary: 131 mg/dL — ABNORMAL HIGH (ref 70–99)
Glucose-Capillary: 168 mg/dL — ABNORMAL HIGH (ref 70–99)
Glucose-Capillary: 173 mg/dL — ABNORMAL HIGH (ref 70–99)

## 2012-10-21 MED ORDER — INSULIN REGULAR HUMAN 100 UNIT/ML IJ SOLN
INTRAVENOUS | Status: AC
  Administered 2012-10-21: 18:00:00 via INTRAVENOUS
  Filled 2012-10-21: qty 2000

## 2012-10-21 MED ORDER — CEFTRIAXONE SODIUM 2 G IJ SOLR
2.0000 g | INTRAMUSCULAR | Status: DC
  Administered 2012-10-21: 2 g via INTRAVENOUS
  Filled 2012-10-21 (×2): qty 2

## 2012-10-21 NOTE — Progress Notes (Signed)
Physical Therapy Session Note  Patient Details  Name: HAVARD KAMEL MRN: [id number] Date of Birth: Dec 05, 1931  Today's Date: 10/21/2012 Time: 0930-1000 Time Calculation (min): 30 min  Skilled Therapeutic Interventions/Progress Updates:    Treatment focused on functional mobility training including transfers (S/min A) with RW, gait x 95' with steady A with RW, bed mobility, general endurance/activity tolerance and w/c propulsion using bilateral UEs mostly (some use of LEs intermittently) with S. Pt with good participation in session. Returned to bed at end of session to rest with steady A for transfer.   Therapy Documentation Precautions:  Precautions Precautions: Fall;Cervical Precaution Comments: reviewed precautions with pt's girlfriend who will be his primary caretaker.   Required Braces or Orthoses: Cervical Brace Cervical Brace: Hard collar Restrictions Weight Bearing Restrictions: No Pain:  Denies pain.   Locomotion : Ambulation Ambulation/Gait Assistance: 4: Min guard   See FIM for current functional status  Therapy/Group: Individual Therapy  Karolee Stamps [facility] 10/21/2012, 10:24 AM

## 2012-10-21 NOTE — Progress Notes (Signed)
Alerted and orientated x 4. Irritable this shift. Refusing therapy sessions at times. Pt c/o of being sleepy, however, only sleeps intermittently when in bed. Peg site continues to leak.  Dressing 75% saturated with clear, serous drainage. Site cleansed, dressing changed. Red irritated, skin around peg site. No unsafe behavior exhibited. Calls appropriately for assistance. Restraints discontinued per md's verbal order this a.m. Bed alarm in place. Continuing to monitor.Marland Kitchen
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
## 2012-10-21 NOTE — Progress Notes (Signed)
Speech Language Pathology Daily Session Note  Patient Details  Name: [name] MRN: [id number] Date of Birth: 1931-10-08  Today's Date: 10/21/2012 Time: 1120-1155 Time Calculation (min): 35 min  Short Term Goals: Week 3: SLP Short Term Goal 1 (Week 3): Pt will utilize extnernal memory aids to increase recall/carryover of newly learned information with Mod I.  SLP Short Term Goal 2 (Week 3): Pt will utilize increased vocal intensity and a slow rate to increase speech intelligibility at the sentence level to 100% intelligibility with Mod I.   Skilled Therapeutic Interventions: Treatment focus on problem solving and speech intelligibility. Pt required Max verbal cues for utilization of increased vocal intensity at the sentence level and was ~75% intelligible. Pt also required total A for problem solving in regards to strategies to decrease coughing and overheating.  Difficult to differentiate between behavior vs. cognitive impairment.    FIM:  Comprehension Comprehension Mode: Auditory Comprehension: 4-Understands basic 75 - 89% of the time/requires cueing 10 - 24% of the time Expression Expression Mode: Verbal Expression: 4-Expresses basic 75 - 89% of the time/requires cueing 10 - 24% of the time. Needs helper to occlude trach/needs to repeat words. Social Interaction Social Interaction: 4-Interacts appropriately 75 - 89% of the time - Needs redirection for appropriate language or to initiate interaction. Problem Solving Problem Solving: 3-Solves basic 50 - 74% of the time/requires cueing 25 - 49% of the time Memory Memory: 4-Recognizes or recalls 75 - 89% of the time/requires cueing 10 - 24% of the time  Pain Pain Assessment Pain Assessment: No/denies pain  Therapy/Group: Individual Therapy  [name] 10/21/2012, 12:10 PM

## 2012-10-21 NOTE — Progress Notes (Signed)
Physical Therapy Note  Patient Details  Name: [name] MRN: [id number] Date of Birth: [birth year] Today's Date: 10/21/2012  Pt missed 60 minutes of skilled PT session due to refusal to participate. Multiple attempts made including encouragement from RN and nurse tech and pt continued to refuse. Pt stating "Not today!" Reminded pt of benefits of OOB and participating in therapy including increasing his chances of going home, and pt responds with "I know I know" but continued to refuse.    Karolee Stamps [facility] 10/21/2012, 3:08 PM

## 2012-10-21 NOTE — Progress Notes (Signed)
Deatra Ina, PA notified of pt fall; O2 Sat 95% room air, Temp 98.6, HR 78; BP 132/65; Resp 20; received Ambien prior to fall; rt eye pupil 4 cm and brisk, lt eye pupil 2 cm and brisk; pt drowsy, but oriented X4; lt elbow with skin tear, Tegaderm applied; pt denied pain and hitting head.   Order received: Monitor pt tonight and apply soft waistbelt for safety.
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
## 2012-10-21 NOTE — Progress Notes (Addendum)
Occupational Therapy Note  Patient Details  Name: [name] MRN: [id number] Date of Birth: 1931-09-23 Today's Date: 10/21/2012  4098-1191 - 25 Minutes Missed 35 Minutes of skilled OT Individual Therapy No complaints of pain, just complaints of fatigue. Patient missed 35 minutes of skilled occupational therapy secondary to refusal. Patient found supine in bed with complaints of being tired. Patient yelling at RN and therapist, "Lower my bed & put the blanket on me!". Therapist attempted to have patient sit edge of bed, but patient yelling "No, let me rest!". Therapist attempted to see patient 15 minutes later and patient willing to get OOB. Patient engaged in bed mobility to sit edge of bed in order to complete LB dressing; patient motivated to thread bilateral LEs into pants. With minimal assistance patient was able to thread bilateral feet and pull pants up to waist. Patient then ambulated from bed -> hallway and back to w/c using rolling walker and with min assist from therapist. At end of session left patient seated in w/c with quick release bell donned, call bell & phone left within reach.   [name] 10/21/2012, 7:59 AM
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
## 2012-10-21 NOTE — Plan of Care (Signed)
Problem: RH PAIN MANAGEMENT Goal: RH STG PAIN MANAGED AT OR BELOW PT'S PAIN GOAL <3 Outcome: Progressing No c/o pain     

## 2012-10-22 ENCOUNTER — Inpatient Hospital Stay (HOSPITAL_COMMUNITY): Payer: PRIVATE HEALTH INSURANCE

## 2012-10-22 ENCOUNTER — Inpatient Hospital Stay (HOSPITAL_COMMUNITY): Payer: PRIVATE HEALTH INSURANCE | Admitting: Speech Pathology

## 2012-10-22 DIAGNOSIS — J690 Pneumonitis due to inhalation of food and vomit: Secondary | ICD-10-CM

## 2012-10-22 DIAGNOSIS — Z5189 Encounter for other specified aftercare: Secondary | ICD-10-CM

## 2012-10-22 DIAGNOSIS — R1310 Dysphagia, unspecified: Secondary | ICD-10-CM

## 2012-10-22 DIAGNOSIS — S069X9A Unspecified intracranial injury with loss of consciousness of unspecified duration, initial encounter: Secondary | ICD-10-CM

## 2012-10-22 DIAGNOSIS — IMO0002 Reserved for concepts with insufficient information to code with codable children: Secondary | ICD-10-CM

## 2012-10-22 LAB — GLUCOSE, CAPILLARY

## 2012-10-22 MED ORDER — BISACODYL 10 MG RE SUPP
10.0000 mg | Freq: Every day | RECTAL | Status: DC | PRN
  Administered 2012-10-22: 10 mg via RECTAL
  Filled 2012-10-22: qty 1

## 2012-10-22 MED ORDER — TRAZODONE HCL 50 MG PO TABS
50.0000 mg | ORAL_TABLET | Freq: Every evening | ORAL | Status: DC | PRN
  Administered 2012-10-24 – 2012-11-01 (×3): 50 mg via ORAL
  Filled 2012-10-22 (×4): qty 1

## 2012-10-22 MED ORDER — PRO-STAT SUGAR FREE PO LIQD
30.0000 mL | Freq: Every day | ORAL | Status: DC
  Administered 2012-10-23 – 2012-10-24 (×2): 30 mL
  Filled 2012-10-22 (×3): qty 30

## 2012-10-22 MED ORDER — OSMOLITE 1.5 CAL PO LIQD
1000.0000 mL | ORAL | Status: DC
  Administered 2012-10-22 – 2012-10-26 (×5): 1000 mL
  Filled 2012-10-22 (×6): qty 1000

## 2012-10-22 MED ORDER — FAT EMULSION 20 % IV EMUL
250.0000 mL | INTRAVENOUS | Status: DC
  Administered 2012-10-22: 250 mL via INTRAVENOUS
  Filled 2012-10-22: qty 250

## 2012-10-22 MED ORDER — INSULIN GLARGINE 100 UNIT/ML ~~LOC~~ SOLN
10.0000 [IU] | Freq: Two times a day (BID) | SUBCUTANEOUS | Status: DC
  Administered 2012-10-22 – 2012-11-03 (×23): 10 [IU] via SUBCUTANEOUS

## 2012-10-22 MED ORDER — ZINC TRACE METAL 1 MG/ML IV SOLN
INTRAVENOUS | Status: DC
  Administered 2012-10-22: 17:00:00 via INTRAVENOUS
  Filled 2012-10-22: qty 2000

## 2012-10-22 NOTE — Progress Notes (Signed)
Occupational Therapy Note  Patient Details  Name: [name] MRN: [id number] Date of Birth: [birth year] Today's Date: 10/22/2012  Time: 1000-1045 Pt denies pain Individual therapy  Pt seated in w/c upon arrival.  Pt declined bathing and dressing this morning but agreed to BUE exercises and walking to door with RW.  Pt attempted to perform BUE exercises with 1.5 lb and 1 lb weight but stated he couldn't.  Pt performed AROM exercises - shoulder flexion, elbow flexion/extension.  therapist presented pt with super soft theraputty.  Pt would not attempt using it for grip strength.  Pt stated he wanted to walk to door and then get in bed for visitor.   Lavone Neri [facility] 10/22/2012, 10:53 AM

## 2012-10-22 NOTE — Progress Notes (Signed)
Social Work Patient ID: Quintin Alto, male   DOB: 02-02-1931, 78 y.o.   MRN: [id number]  Have reviewed team conference with patient and daughter, Marcelino Duster.  Both understand that originally targeted d/c date has been changed to 10/30 due to ongoing medical issues.  Daughter aware of medical issues and remains very concerned.  Have explained that a transfer back to an acute unit is a possibility.  Daughter hopeful the restart of abx will help to curb his decline.  Marcelino Duster also reports that she and two other family members are planning to provide 24/7 assistance for pt at home (original caregiver no longer involved).  Plan to schedule family education with these caregivers once pt appears improved and readying for actual d/c.  Continue to follow.  Saga Balthazar
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
## 2012-10-22 NOTE — Progress Notes (Signed)
At approximately 2010 pm dressing around peg site with brown gelatin like substance, saturating 95% of dressing. Skin around peg site intact with red irritation noted. No unsafe behaviors noted, bed alarm and side rails x 3. Will continue to monitor patient.

## 2012-10-22 NOTE — Progress Notes (Signed)
Physical Therapy Session Note  Patient Details  Name: [name] MRN: [id number] Date of Birth: May 23, 1931  Today's Date: 10/22/2012 Time: 6213-0865 Time Calculation (min): 43 min  Short Term Goals: Week 3:  PT Short Term Goal 1 (Week 3): = LTGS  Skilled Therapeutic Interventions/Progress Updates:    Treatment focused on functional gait with RW to/from therapy, basic transfers and sit to stands with close S, dynamic standing balance activities. Pt able to tap ball back and forth with 1 UE support and progressed to throwing and catching with bilateral UEs to work on balance, standing tolerance, endurance, and UE coordination. Pt with overall steady A needed for balance activities and close S/steady A for gait. Pt chose to sit up in w/c at end of therapy session without prompting. Safety belt in place and call bell in reach.   Therapy Documentation Precautions:  Precautions Precautions: Fall;Cervical Precaution Comments: reviewed precautions with pt's girlfriend who will be his primary caretaker.   Required Braces or Orthoses: Cervical Brace Cervical Brace: Hard collar Restrictions Weight Bearing Restrictions: No   Pain: Pain Assessment Pain Assessment: No/denies pain   Locomotion : Ambulation Ambulation/Gait Assistance: 4: Min guard   See FIM for current functional status  Therapy/Group: Individual Therapy  Karolee Stamps [facility] 10/22/2012, 4:24 PM
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
## 2012-10-22 NOTE — Progress Notes (Addendum)
Nutrition Follow-up  Intervention:   1. TPN wean per pharmacy; TF advancement per MD - once medically able, advance Osmolite 1.5 by 10 ml/hr every 8 hours, or as tolerated, to goal of 65 ml/hr x 20 hours daily 2. Resume 30 ml Prostat per tube daily in anticipation of discontinuation of TPN soon 3. Resume free water flushes of 200 ml QID once TPN discontinued 4. RD to continue to follow nutrition care plan  Assessment:   Pharmacy is planning to decrease TPN with Clinimix E 5/20 to 60 ml/hr now that TPN has been initiated.  Lipids (20% IVFE @ 10.4 ml/hr), multivitamins, and trace elements are provided 3 times weekly (MWF) due to national backorder.  Provides 1481 kcal and 72 grams protein daily (based on weekly average).  Meets 78% minimum estimated kcal and 85% minimum estimated protein needs.  Pt is receiving Osmolite 1.5 at 25 ml/hr; per RN is tolerating well. This provides  750 kcal, 32 grams protein and 381 ml free water daily.   Total TPN and TF regimen provides: 2231 kcal and 104 grams protein. This meets > 100% of estimated protein and kcal needs.  Diet Order:  NPO  Meds: Scheduled Meds:    . albuterol  2.5 mg Nebulization BID  . antiseptic oral rinse  15 mL Mouth Rinse QID  . aspirin  81 mg Per Tube Daily  . budesonide  0.5 mg Nebulization BID  . insulin aspart  0-9 Units Subcutaneous Q4H  . ipratropium  0.5 mg Nebulization BID  . levothyroxine  88 mcg Per Tube QAC breakfast  . metoCLOPramide (REGLAN) injection  5 mg Intravenous Q8H  . Muscle Rub   Topical TID  . pantoprazole sodium  40 mg Per Tube BID  . predniSONE  10 mg Oral Q breakfast  . QUEtiapine  50 mg Oral QHS  . thiamine  100 mg Per Tube Daily  . DISCONTD: cefTRIAXone (ROCEPHIN)  IV  2 g Intravenous Q24H   Continuous Infusions:    . sodium chloride 20 mL/hr at 10/20/12 0045  . TPN (CLINIMIX) +/- additives 80 mL/hr at 10/20/12 1720   And  . fat emulsion 250 mL (10/20/12 1721)  . feeding supplement (OSMOLITE  1.5 CAL) 1,000 mL (10/22/12 0715)  . TPN (CLINIMIX) +/- additives 80 mL/hr at 10/21/12 1747   PRN Meds:.acetaminophen, acetaminophen, albuterol, bisacodyl, LORazepam, methocarbamol, ondansetron (ZOFRAN) IV, polyethylene glycol, sodium chloride, sodium chloride, sorbitol, zolpidem  Labs:  CMP     Component Value Date/Time   NA 133* 10/20/2012 0500   K 3.9 10/20/2012 0500   CL 97 10/20/2012 0500   CO2 32 10/20/2012 0500   GLUCOSE 232* 10/20/2012 0500   BUN 19 10/20/2012 0500   CREATININE 0.55 10/20/2012 0500   CALCIUM 8.2* 10/20/2012 0500   PROT 6.0 10/20/2012 0500   ALBUMIN 2.0* 10/20/2012 0500   AST 33 10/20/2012 0500   ALT 36 10/20/2012 0500   ALKPHOS 155* 10/20/2012 0500   BILITOT 0.7 10/20/2012 0500   GFRNONAA >90 10/20/2012 0500   GFRAA >90 10/20/2012 0500   CBG (last 3)   Basename 10/22/12 0915 10/22/12 0400 10/22/12  GLUCAP 186* 135* 168*     Intake/Output Summary (Last 24 hours) at 10/22/12 1011 Last data filed at 10/22/12 0830  Gross per 24 hour  Intake    125 ml  Output   2475 ml  Net  -2350 ml  BM 10/17  Weight Status:  53 kg/118 lb (10/23) - trending down  Body mass  index is 17.52 kg/(m^2). Underweight  Re-estimated needs:  1900 - 2100 kcal, 85-100 grams protein  Nutrition Dx:  Swallowing difficulty r/t cervical fracture AEB PEG tube required for nutrition. Ongoing.  Goal:  Meet 90-100% estimated nutrition needs - met  Monitor:  TF re-initiation, weight trend, labs, TPN prescription  Jarold Motto MS, RD, LDN Pager: [phone] After-hours pager: [phone]

## 2012-10-22 NOTE — Progress Notes (Signed)
Patient ID: [name], male   DOB: Feb 11, 1931, 78 y.o.   MRN: [id number]  no major issues overnight. Denies sob. Has intermittent cough. Wants new mattress  A 12 point review of systems has been performed and if not noted above is otherwise negative.  Objective: Vital Signs: Blood pressure 152/51, pulse 86, temperature 98.9 F (37.2 C), temperature source Oral, resp. rate 18, height 5' 8.9" (1.75 m), weight 51.9 kg (114 lb 6.7 oz), SpO2 98.00%. Dg Chest 2 View  10/20/2012  *RADIOLOGY REPORT*  Clinical Data: Pneumonia and shortness of breath.  CHEST - 2 VIEW  Comparison: Chest x-ray 10/18/2012.  Findings: Extensive right lower lobe air space consolidation is again noted, compatible with severe bronchopneumonia.  Lungs otherwise appear clear.  Small bilateral pleural effusions.  No evidence of pulmonary edema.  Heart size is normal.  Mediastinal contours are unremarkable.  Atherosclerosis in the thoracic aorta. There is a right upper extremity PICC with tip terminating in the proximal superior vena cava.  IMPRESSION: 1.  Persistent severe right lower lobe bronchopneumonia. 2.  Small bilateral pleural effusions. 3.  Atherosclerosis.   Original Report Authenticated By: Florencia Reasons, M.D.    No results found for this basename: WBC:2,HGB:2,HCT:2,PLT:2 in the last 72 hours  Basename 10/20/12 0500  NA 133*  K 3.9  CL 97  CO2 32  GLUCOSE 232*  BUN 19  CREATININE 0.55  CALCIUM 8.2*   CBG (last 3)   Basename 10/22/12 0400 10/22/12 10/21/12 2003  GLUCAP 135* 168* 189*    Wt Readings from Last 3 Encounters:  10/21/12 51.9 kg (114 lb 6.7 oz)  10/02/12 56.4 kg (124 lb 5.4 oz)  10/02/12 56.4 kg (124 lb 5.4 oz)    Physical Exam:  Constitutional:  Patient with poor dental dentition  HENT:  Head: Normocephalic.  edentulous  Eyes:  Pupils round and reactive to light  Neck:  Cervical collar intact  Cardiovascular: Normal rate and regular rhythm.  Pulmonary/Chest: decreased breath  sounds right base more than left. +/- effort. No cough at present. No distress Abdominal: Soft. Bowel sounds are normal. He exhibits no distension. No active drainage at tube site Musculoskeletal: He exhibits no edema. Some pain at right flank, into r iliac crest and left shoulder Neurological: He is alert.  Reasonable insight and awareness. Was joking with me on occasion. Intermittent confusion today  LUE and BUE are 3 to 3+/5.  LE's are grossly 3+ to 4/5. Sensation slightly diminished left greater than right upper extremities. Speech clearer. Voice is stronger. Skin:     Assessment/Plan: 1. Functional deficits secondary to C3-4 fx with central cord injury which require 3+ hours per day of interdisciplinary therapy in a comprehensive inpatient rehab setting. Physiatrist is providing close team supervision and 24 hour management of active medical problems listed below. Physiatrist and rehab team continue to assess barriers to discharge/monitor patient progress toward functional and medical goals.  Had a "motivational" talk with him regarding participation and that if he wants to go home he has to give 100%  FIM: FIM - Bathing Bathing Steps Patient Completed: Chest;Left Arm;Abdomen;Right upper leg;Left upper leg Bathing: 0: Activity did not occur  FIM - Upper Body Dressing/Undressing Upper body dressing/undressing steps patient completed: Thread/unthread right sleeve of front closure shirt/dress;Thread/unthread left sleeve of front closure shirt/dress Upper body dressing/undressing: 3: Mod-Patient completed 50-74% of tasks FIM - Lower Body Dressing/Undressing Lower body dressing/undressing: 1: Total-Patient completed less than 25% of tasks  FIM - Hotel manager  Devices: Grab bar or rail for support Toileting: 0: Activity did not occur  FIM - Diplomatic Services operational officer Devices: Grab bars;Walker Event organiser: 0-Activity did not occur  FIM -  Banker Devices: Walker;HOB elevated Bed/Chair Transfer: 6: Supine > Sit: No assist;6: Sit > Supine: No assist;4: Bed > Chair or W/C: Min A (steadying Pt. > 75%);4: Chair or W/C > Bed: Min A (steadying Pt. > 75%)  FIM - Locomotion: Wheelchair Locomotion: Wheelchair: 2: Travels 50 - 149 ft with supervision, cueing or coaxing FIM - Locomotion: Ambulation Locomotion: Ambulation Assistive Devices: Designer, industrial/product Ambulation/Gait Assistance: 4: Min guard Locomotion: Ambulation: 2: Travels 50 - 149 ft with minimal assistance (Pt.>75%)  Comprehension Comprehension Mode: Auditory Comprehension: 5-Follows basic conversation/direction: With no assist  Expression Expression Mode: Verbal Expression: 5-Expresses basic needs/ideas: With extra time/assistive device  Social Interaction Social Interaction: 4-Interacts appropriately 75 - 89% of the time - Needs redirection for appropriate language or to initiate interaction.  Problem Solving Problem Solving: 4-Solves basic 75 - 89% of the time/requires cueing 10 - 24% of the time  Memory Memory: 4-Recognizes or recalls 75 - 89% of the time/requires cueing 10 - 24% of the time  Medical Problem List and Plan:  1. 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 DVT Prophylaxis/Anticoagulation: SCDs. Monitor for any signs of DVT  3. Pain Management: Tylenol and robaxin prn , add sports cream for painful areas 4. Mood/delirium/acute encephalopathy.    -as a whole better, still some hs confusion 5. Neuropsych: This patient is not capable of making decisions on his/her own behalf.  6. Dysphagia. Status post gastrostomy tube placement, tube was accidentally removed when it was caught in the bed yesterday. A replacment tube with balloon rentention was placed  -TNA  -resume TF today at low rate 7. Pneumonia-cxr shows worsening RLL pneumonia, but clinically he appaears  stable  -appreciate CCM recs. Monitor clinically. Check cxr's  -aspiration precautions  -IS, FV, OOB routinely with therapy 8. Hypothyroidism. Synthroid  9. Diabetes mellitus. -resumed lantus at 10u given elevated CBG's.  . Patient was on tradgenta 5 mg daily prior to admission   LOS (Days) 19 A FACE TO FACE EVALUATION WAS PERFORMED  Anadia Helmes T 10/22/2012, 7:01 AM

## 2012-10-22 NOTE — Progress Notes (Signed)
Speech Language Pathology Daily Session Note  Patient Details  Name: [name] MRN: [id number] Date of Birth: 01/13/1931  Today's Date: 10/22/2012 Time: 1350-1430 Time Calculation (min): 40 min  Short Term Goals: Week 3: SLP Short Term Goal 1 (Week 3): Pt will utilize extnernal memory aids to increase recall/carryover of newly learned information with Mod I.  SLP Short Term Goal 2 (Week 3): Pt will utilize increased vocal intensity and a slow rate to increase speech intelligibility at the sentence level to 100% intelligibility with Mod I.   Skilled Therapeutic Interventions: Treatment focus on emergent awareness and speech intelligibility. Pt demonstrated increased utilization of increased voice intensity and was ~90% intelligible today throughout functional conversation. Pt also demonstrated increased emergent awareness into deficits and was able to verbally problem solve/reason on how to increase his endurance and strength through participation with therapy. Pt also recalled morning events and conversation with MD with Mod I.    FIM:  Comprehension Comprehension: 5-Understands basic 90% of the time/requires cueing < 10% of the time Expression Expression Mode: Verbal Expression: 5-Expresses basic needs/ideas: With extra time/assistive device Social Interaction Social Interaction: 4-Interacts appropriately 75 - 89% of the time - Needs redirection for appropriate language or to initiate interaction. Problem Solving Problem Solving: 5-Solves basic 90% of the time/requires cueing < 10% of the time Memory Memory: 5-Recognizes or recalls 90% of the time/requires cueing < 10% of the time  Pain Pain Assessment Pain Assessment: No/denies pain  Therapy/Group: Individual Therapy  [name] 10/22/2012, 3:37 PM

## 2012-10-22 NOTE — Patient Care Conference (Signed)
Inpatient RehabilitationTeam Conference Note Date: 10/21/2012   Time: 2:10 PM    Patient Name: [name]      Medical Record Number: [id number]  Date of Birth: 10/31/1931 Sex: Male         Room/Bed: 4001/4001-01 Payor Info: Payor: Advertising copywriter MEDICARE  Plan: [facility]  Product Type: *No Product type*     Admitting Diagnosis: central cord peg  Admit Date/Time:  10/03/2012  2:45 PM Admission Comments: No comment available   Primary Diagnosis:  Central cord syndrome Principal Problem: Central cord syndrome  Patient Active Problem List   Diagnosis Date Noted  . HAP (hospital-acquired pneumonia) 10/07/2012  . Central cord syndrome 10/03/2012  . Cervical spine fracture 09/26/2012  . Respiratory failure, post-operative 09/26/2012  . HCC (hepatocellular carcinoma) 04/08/2012  . 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 DM (diabetes mellitus) 04/08/2012  . Hypertension 04/08/2012  . Hypothyroid 04/08/2012  . Alcohol abuse, in remission 04/08/2012    Expected Discharge Date: Expected Discharge Date: 10/29/12  Team Members Present: Physician: Dr. Faith Rogue Nurse Present: Daryll Brod, RN PT Present: Karolee Stamps, PT OT Present: Edwin Cap, OT Other (Discipline and Name): Tora Duck,  PPS Coordinator     Current Status/Progress Goal Weekly Team Focus  Medical   recurrent pneumonia, abx restarted. on tna  resume tf and tolerate them  resume tf in am   Bowel/Bladder   Foley to straight drain. Continent of bowel. LBM 10/16/12. Pt refusing laxatives, per report  Continent of bowel and bladder  Discuss removal of foley   Swallow/Nutrition/ Hydration   NPO  d/c swallowing goals  N/A   ADL's   total assist for bathing & dressing at this time, min assist for transfers  supervision -> min assist  ADL retraining, ADL transfers, functional use of bilateral UEs, overall activity tolerance/endurance   Mobility   min/mod A  S/min A  consisten particpation, family education,  dynamic balance, gait   Communication   Min A  supervision  utilziation of speech intelligibility strategies    Safety/Cognition/ Behavioral Observations  Min A-Mod A  Min A  reasoning, problem solving    Pain             Skin   Posterior cervical in cision with steristrips intact. Sacrum with bruised coccyx  No additional skin breakdown  Remind pt to turn q 2hrs.      *See Interdisciplinary Assessment and Plan and progress notes for long and short-term goals  Barriers to Discharge: multiple medical issues    Possible Resolutions to Barriers:  manage above, family ed    Discharge Planning/Teaching Needs:  Originally designated caregiver is now not going to be involved.  Daughter Marcelino Duster) reports that she will share in pt's 24/7 care with pt's two sisters (both of whom are retired).  Aware that education will need to be completed with new caregivers prior to d/c      Team Discussion:  Medical issues continue.  Pt continues to be resistant to therapies and making only slow progress requiring steady assistance at best.  Await IR and Pulm follow up.  Do not feel he will be ready for initially targeted d/c date (10/25).  Tentatively moved to 10/30, however, may need transfer to acute if medical issues continue/ worsen.  Revisions to Treatment Plan:  Change in d/c date due to medical issues.   Continued Need for Acute Rehabilitation Level of Care: The patient requires daily medical management by a physician with specialized training in physical medicine  and rehabilitation for the following conditions: Daily direction of a multidisciplinary physical rehabilitation program to ensure safe treatment while eliciting the highest outcome that is of practical value to the patient.: Yes Daily medical management of patient stability for increased activity during participation in an intensive rehabilitation regime.: Yes Daily analysis of laboratory values and/or radiology reports with any  subsequent need for medication adjustment of medical intervention for : Post surgical problems;Neurological problems;Other;Pulmonary problems  Leenah Seidner 10/22/2012, 12:49 PM

## 2012-10-23 ENCOUNTER — Inpatient Hospital Stay (HOSPITAL_COMMUNITY): Payer: PRIVATE HEALTH INSURANCE | Admitting: Physical Therapy

## 2012-10-23 ENCOUNTER — Inpatient Hospital Stay (HOSPITAL_COMMUNITY): Payer: PRIVATE HEALTH INSURANCE | Admitting: Occupational Therapy

## 2012-10-23 ENCOUNTER — Inpatient Hospital Stay (HOSPITAL_COMMUNITY): Payer: PRIVATE HEALTH INSURANCE | Admitting: Speech Pathology

## 2012-10-23 LAB — CBC
Hemoglobin: 10 g/dL — ABNORMAL LOW (ref 13.0–17.0)
MCH: 30.5 pg (ref 26.0–34.0)
MCV: 89.6 fL (ref 78.0–100.0)
RBC: 3.28 MIL/uL — ABNORMAL LOW (ref 4.22–5.81)

## 2012-10-23 LAB — COMPREHENSIVE METABOLIC PANEL
ALT: 25 U/L (ref 0–53)
Albumin: 1.9 g/dL — ABNORMAL LOW (ref 3.5–5.2)
Alkaline Phosphatase: 132 U/L — ABNORMAL HIGH (ref 39–117)
GFR calc Af Amer: >90 mL/min (ref >90)
Glucose, Bld: 189 mg/dL — ABNORMAL HIGH (ref 70–99)
Potassium: 3.6 mEq/L (ref 3.5–5.1)
Sodium: 134 mEq/L — ABNORMAL LOW (ref 135–145)
Total Protein: 6 g/dL (ref 6.0–8.3)

## 2012-10-23 LAB — GLUCOSE, CAPILLARY
Glucose-Capillary: 161 mg/dL — ABNORMAL HIGH (ref 70–99)
Glucose-Capillary: 190 mg/dL — ABNORMAL HIGH (ref 70–99)
Glucose-Capillary: 114 mg/dL — ABNORMAL HIGH (ref 70–99)

## 2012-10-23 MED ORDER — SODIUM CHLORIDE 0.9 % IV SOLN
INTRAVENOUS | Status: AC
  Administered 2012-10-24: 02:00:00 via INTRAVENOUS

## 2012-10-23 MED ORDER — SODIUM CHLORIDE 0.9 % IV SOLN: INTRAVENOUS | Status: DC

## 2012-10-23 NOTE — Progress Notes (Signed)
PARENTERAL NUTRITION CONSULT NOTE - Follow-up Pharmacy Consult for TPN Indication: intolerance to enteral feeding   Plan to discontinue TPN today now that patient is on and tolerating tube feeds. Recommend decrease TPN to 9ml/hr at 1600 and then turn off after 2 hours. Pharmacist will sign off.  Verlene Mayer, PharmD, BCPS Pager [phone]    No Known Allergies  Patient Measurements: Height: 5' 8.9" (175 cm) Weight: 121 lb 7.6 oz (55.1 kg) IBW/kg (Calculated) : 70.47   Vital Signs: Temp: 97.3 F (36.3 C) (10/24 0556) Temp src: Axillary (10/24 0556) BP: 124/55 mmHg (10/24 0556) Pulse Rate: 54  (10/24 0556) Intake/Output from previous day: 10/23 0701 - 10/24 0700 In: 400 [NG/GT:100] Out: 300 [Urine:300] Intake/Output from this shift:    Labs:  Basename 10/23/12 0500  WBC 6.9  HGB 10.0*  HCT 29.4*  PLT 108*  APTT --  INR --     Basename 10/23/12 0500  NA 134*  K 3.6  CL 97  CO2 31  GLUCOSE 189*  BUN 28*  CREATININE 0.64  LABCREA --  CREAT24HRUR --  CALCIUM 8.6  MG 1.7  PHOS 3.0  PROT 6.0  ALBUMIN 1.9*  AST 32  ALT 25  ALKPHOS 132*  BILITOT 0.5  BILIDIR --  IBILI --  PREALBUMIN --  TRIG --  CHOLHDL --  CHOL --   Estimated Creatinine Clearance: 58.4 ml/min (by C-G formula based on Cr of 0.64).    Basename 10/23/12 0810 10/23/12 0405 10/22/12 2356  GLUCAP 190* 161* 183*    10/23/2012,9:03 AM

## 2012-10-23 NOTE — Progress Notes (Signed)
Speech Language Pathology Daily Session Note  Patient Details  Name: NNAMDI DREWS MRN: [id number] Date of Birth: 01/08/1931  Today's Date: 10/23/2012 Time: 8657-8469 Time Calculation (min): 40 min  Short Term Goals: Week 3: SLP Short Term Goal 1 (Week 3): Pt will utilize extnernal memory aids to increase recall/carryover of newly learned information with Mod I.  SLP Short Term Goal 2 (Week 3): Pt will utilize increased vocal intensity and a slow rate to increase speech intelligibility at the sentence level to 100% intelligibility with Mod I.   Skilled Therapeutic Interventions: Treatment focus on functional problem solving, emergent awareness and activity tolerance.  Pt tolerated session while sitting up in his wheelchair. Pt participated in basic problem solving task with overall Mod I and transferred back to his bed with overall supervision verbal question cues for safety awareness.    FIM:  Comprehension Comprehension Mode: Auditory Comprehension: 5-Follows basic conversation/direction: With extra time/assistive device Expression Expression Mode: Verbal Expression: 5-Expresses basic needs/ideas: With extra time/assistive device Social Interaction Social Interaction: 5-Interacts appropriately 90% of the time - Needs monitoring or encouragement for participation or interaction. Problem Solving Problem Solving: 5-Solves basic 90% of the time/requires cueing < 10% of the time Memory Memory: 5-Recognizes or recalls 90% of the time/requires cueing < 10% of the time  Pain Pain Assessment Pain Assessment: No/denies pain Pain Score: 0-No pain  Therapy/Group: Individual Therapy  Shalane Florendo 10/23/2012, 4:37 PM

## 2012-10-23 NOTE — Progress Notes (Signed)
Physical Therapy Session Note  Patient Details  Name: [name] MRN: [id number] Date of Birth: 01-07-1931  Today's Date: 10/23/2012 Time: 1003-1031 Time Calculation (min): 28 min  Short Term Goals: Week 3:  PT Short Term Goal 1 (Week 3): = LTGS  Skilled Therapeutic Interventions/Progress Updates:    This session focused on longer distance gait with rest break in between.  Pt able to make it from his room to the gym and back with a supine rest break without his RW to rest while walking.  Safe bed mobility reviewed with explaination of why this is safer for his neck.  See details below.    Therapy Documentation Precautions:  Precautions Precautions: Fall;Cervical Precaution Comments: reviewed precautions with pt's girlfriend who will be his primary caretaker.   Required Braces or Orthoses: Cervical Brace Cervical Brace: Hard collar Restrictions Weight Bearing Restrictions: No General:   Vital Signs: Therapy Vitals Temp: 97.6 F (36.4 C) Temp src: Oral Pulse Rate: 68  Resp: 18  BP: 101/82 mmHg (recheck) Patient Position, if appropriate: Lying Oxygen Therapy SpO2: 91 % O2 Device: Nasal cannula O2 Flow Rate (L/min): 2 L/min Pain: Pain Assessment Pain Assessment: No/denies pain Pain Score: 0-No pain Mobility: Transfers Sit to Stand: 5: Supervision;With upper extremity assist;From bed Stand to Sit: 5: Supervision;To bed;With armrests;With upper extremity assist Locomotion : Ambulation Ambulation/Gait Assistance: 4: Min guard Ambulation Distance (Feet): 150 Feet (x2) Assistive device: Rolling walker Ambulation/Gait Assistance Details: min guard assist for balance due to decreased endurance and weak, shaky knees.     See FIM for current functional status  Therapy/Group: Individual Therapy  Lurena Joiner B. Jaidence Geisler, PT, DPT [phone]   10/23/2012, 4:54 PM

## 2012-10-23 NOTE — Progress Notes (Signed)
Occupational Therapy Session Note  Patient Details  Name: [name] MRN: [id number] Date of Birth: 05/08/1931  Today's Date: 10/23/2012 Time: 4098-1191 Time Calculation (min): 33 min  Short Term Goals: Week 3:  OT Short Term Goal 1 (Week 3): Short Term Goals = Long Term Goals  Skilled Therapeutic Interventions/Progress Updates:    Pt transferred supine to sit EOB with close supervision and then to the wheelchair with min assist.  Micah Flesher down to the OT gym and worked on UE strengthening using the UE ergonometer.  Performed 8 mins total with resistance set at level 3.  Pt only able to maintain 6-10 RPMs during intervals.  Performed 2 intervals of 1 mins each (one forward and one backward), 2 intervals of  1 1/2 mins each and 2 intervals of approximately 1 1/2-2 minutes each.  Continued to alternate between forward and backward.  Pt needed mod encouragement to participate and stay up in chair at end of session for speech therapy.  Therapy Documentation Precautions:  Precautions Precautions: Fall;Cervical Precaution Comments: reviewed precautions with pt's girlfriend who will be his primary caretaker.   Required Braces or Orthoses: Cervical Brace Cervical Brace: Hard collar Restrictions Weight Bearing Restrictions: No  Vital Signs: Therapy Vitals Patient Position, if appropriate: Sitting Oxygen Therapy SpO2: 92 % O2 Device: None (Room air) Pain: Pain Assessment Pain Assessment: No/denies pain  See FIM for current functional status  Therapy/Group: Individual Therapy  Teigen Bellin OTR/L 10/23/2012, 3:54 PM

## 2012-10-23 NOTE — Progress Notes (Signed)
Occupational Therapy Session Note  Patient Details  Name: [name] MRN: [id number] Date of Birth: [birth year]  Today's Date: 10/23/2012 Time: 0900-0940 Time Calculation (min): 40 min Amount of Missed OT Time (min): 20 Minutes  Short Term Goals: Week 3:  OT Short Term Goal 1 (Week 3): Short Term Goals = Long Term Goals  Skilled Therapeutic Interventions/Progress Updates:  Patient resting in bed upon arrival initially unsure he would participate.  Once he was clear about how long we were scheduled to work and what we were scheduled to accomplish he agreed.  Self care retraining to include sponge bath, dress, and groom.  Focus session on bed mobility, activity tolerance, sit><stand, standing balance,  encourage patient to attempt steps of each task before he just says "I can't do it".    Patient was able to complete ~25% of the tasks that he initially said he could not perform.  Patient agreed that he wants to do as much for himself as he can and not to rely on his caregivers to do everything for him therefore, he agreed to try to remember to say, "I'll try" instead of "I can't".  Patient declined to complete the 60 min session with this clinician and asked to be left alone in bed to rest for the next therapy.   Therapy Documentation Precautions:  Precautions Precautions: Fall;Cervical Required Braces or Orthoses: Cervical Brace Cervical Brace: Hard collar Restrictions Weight Bearing Restrictions: No Pain: Denies pain See FIM for current functional status  Therapy/Group: Individual Therapy  Chandra Feger 10/23/2012, 11:37 AM

## 2012-10-23 NOTE — Progress Notes (Signed)
1610 RN called to patient room by therapist.  IV PICC line leaking fluid and blood backed up to the filter line.  IV RN notified at 0920 to come to patient room to assess line.  IV RN Rosey Bath) arrived around [phone] to assess line; reveals possiblity of break in line.  RN notified PA at [phone] regarding possible break in line.  TPN and Lipids DC by IV RN. SEE MAR.  Continuing to monitor patient.  CBG Q 4.

## 2012-10-23 NOTE — Progress Notes (Signed)
Patient ID: [name], male   DOB: 08/19/1931, 78 y.o.   MRN: [id number]  no major issues overnight. Denies sob. Has intermittent cough. Wants new mattress  A 12 point review of systems has been performed and if not noted above is otherwise negative.  Objective: Vital Signs: Blood pressure 124/55, pulse 54, temperature 97.3 F (36.3 C), temperature source Axillary, resp. rate 17, height 5' 8.9" (1.75 m), weight 55.1 kg (121 lb 7.6 oz), SpO2 99.00%. No results found.  Basename 10/23/12 0500  WBC 6.9  HGB 10.0*  HCT 29.4*  PLT PENDING    Basename 10/23/12 0500  NA 134*  K 3.6  CL 97  CO2 31  GLUCOSE 189*  BUN 28*  CREATININE 0.64  CALCIUM 8.6   CBG (last 3)   Basename 10/23/12 0405 10/22/12 2356 10/22/12 1953  GLUCAP 161* 183* 198*    Wt Readings from Last 3 Encounters:  10/23/12 55.1 kg (121 lb 7.6 oz)  10/02/12 56.4 kg (124 lb 5.4 oz)  10/02/12 56.4 kg (124 lb 5.4 oz)    Physical Exam:  Constitutional:  Patient with poor dental dentition  HENT:  Head: Normocephalic.  edentulous  Eyes:  Pupils round and reactive to light  Neck:  Cervical collar intact  Cardiovascular: Normal rate and regular rhythm.  Pulmonary/Chest: decreased breath sounds right base more than left. +/- effort. No cough at present. No distress Abdominal: Soft. Bowel sounds are normal. He exhibits no distension. No active drainage at tube site Musculoskeletal: He exhibits no edema. Some pain at right flank, into r iliac crest and left shoulder Neurological: He is alert.  Reasonable insight and awareness. Was joking with me on occasion. Intermittent confusion today  LUE and BUE are 3 to 3+/5.  LE's are grossly 3+ to 4/5. Sensation slightly diminished left greater than right upper extremities. Speech clearer. Voice is stronger. Skin:     Assessment/Plan: 1. Functional deficits secondary to C3-4 fx with central cord injury which require 3+ hours per day of interdisciplinary therapy in a  comprehensive inpatient rehab setting. Physiatrist is providing close team supervision and 24 hour management of active medical problems listed below. Physiatrist and rehab team continue to assess barriers to discharge/monitor patient progress toward functional and medical goals.  Had a "motivational" talk with him regarding participation and that if he wants to go home he has to give 100%  FIM: FIM - Bathing Bathing Steps Patient Completed: Chest;Left Arm;Abdomen;Right upper leg;Left upper leg Bathing: 0: Activity did not occur  FIM - Upper Body Dressing/Undressing Upper body dressing/undressing steps patient completed: Thread/unthread right sleeve of front closure shirt/dress;Thread/unthread left sleeve of front closure shirt/dress Upper body dressing/undressing: 3: Mod-Patient completed 50-74% of tasks FIM - Lower Body Dressing/Undressing Lower body dressing/undressing: 1: Total-Patient completed less than 25% of tasks  FIM - Hotel manager Devices: Grab bar or rail for support Toileting: 0: Activity did not occur  FIM - Diplomatic Services operational officer Devices: Grab bars;Walker Event organiser: 0-Activity did not occur  FIM - Banker Devices: Walker;HOB elevated Bed/Chair Transfer: 6: Supine > Sit: No assist;5: Bed > Chair or W/C: Supervision (verbal cues/safety issues)  FIM - Locomotion: Wheelchair Locomotion: Wheelchair: 0: Activity did not occur (gait to/from therapy) FIM - Locomotion: Ambulation Locomotion: Ambulation Assistive Devices: Designer, industrial/product Ambulation/Gait Assistance: 4: Min guard Locomotion: Ambulation: 2: Travels 50 - 149 ft with minimal assistance (Pt.>75%)  Comprehension Comprehension Mode: Auditory Comprehension: 5-Follows basic conversation/direction: With extra time/assistive  device  Expression Expression Mode: Verbal Expression: 5-Expresses basic needs/ideas: With extra  time/assistive device  Social Interaction Social Interaction: 5-Interacts appropriately 90% of the time - Needs monitoring or encouragement for participation or interaction.  Problem Solving Problem Solving: 5-Solves basic 90% of the time/requires cueing < 10% of the time  Memory Memory: 5-Recognizes or recalls 90% of the time/requires cueing < 10% of the time  Medical Problem List and Plan:  1. 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 DVT Prophylaxis/Anticoagulation: SCDs. Monitor for any signs of DVT  3. Pain Management: Tylenol and robaxin prn , add sports cream for painful areas 4. Mood/delirium/acute encephalopathy.    -as a whole better, still some hs confusion 5. Neuropsych: This patient is not capable of making decisions on his/her own behalf.  6. Dysphagia/fFEN. Status post gastrostomy tube placement, tube was accidentally removed when it was caught in the bed yesterday. A replacment tube with balloon rentention was placed  -TNA--stop after current bag completed  -increase TF gradually, 5cc/hr q4hr x5  -give 1 liter ivf today and add h20 flushes via tube  -recheck lytes in am 7. Pneumonia-re check cxr on monday  -appreciate CCM recs. Monitor clinically. Check cxr's  -aspiration precautions  -IS, FV, OOB routinely with therapy 8. Hypothyroidism. Synthroid  9. Diabetes mellitus. -resumed lantus at 10u given elevated CBG's.  . Patient was on tradgenta 5 mg daily prior to admission   LOS (Days) 20 A FACE TO FACE EVALUATION WAS PERFORMED  Suhaan Perleberg T 10/23/2012, 6:53 AM

## 2012-10-24 ENCOUNTER — Inpatient Hospital Stay (HOSPITAL_COMMUNITY): Payer: PRIVATE HEALTH INSURANCE

## 2012-10-24 ENCOUNTER — Inpatient Hospital Stay (HOSPITAL_COMMUNITY): Payer: PRIVATE HEALTH INSURANCE | Admitting: Speech Pathology

## 2012-10-24 ENCOUNTER — Inpatient Hospital Stay (HOSPITAL_COMMUNITY): Payer: PRIVATE HEALTH INSURANCE | Admitting: *Deleted

## 2012-10-24 DIAGNOSIS — S069X9A Unspecified intracranial injury with loss of consciousness of unspecified duration, initial encounter: Secondary | ICD-10-CM

## 2012-10-24 DIAGNOSIS — R1310 Dysphagia, unspecified: Secondary | ICD-10-CM

## 2012-10-24 DIAGNOSIS — J690 Pneumonitis due to inhalation of food and vomit: Secondary | ICD-10-CM

## 2012-10-24 DIAGNOSIS — IMO0002 Reserved for concepts with insufficient information to code with codable children: Secondary | ICD-10-CM

## 2012-10-24 LAB — GLUCOSE, CAPILLARY
Glucose-Capillary: 105 mg/dL — ABNORMAL HIGH (ref 70–99)
Glucose-Capillary: 139 mg/dL — ABNORMAL HIGH (ref 70–99)
Glucose-Capillary: 165 mg/dL — ABNORMAL HIGH (ref 70–99)

## 2012-10-24 LAB — BASIC METABOLIC PANEL
CO2: 31 mEq/L (ref 19–32)
Calcium: 8.1 mg/dL — ABNORMAL LOW (ref 8.4–10.5)
Creatinine, Ser: 0.61 mg/dL (ref 0.50–1.35)
Glucose, Bld: 161 mg/dL — ABNORMAL HIGH (ref 70–99)

## 2012-10-24 MED ORDER — PRO-STAT SUGAR FREE PO LIQD
30.0000 mL | Freq: Two times a day (BID) | ORAL | Status: DC
  Administered 2012-10-24 – 2012-10-27 (×6): 30 mL
  Filled 2012-10-24 (×8): qty 30

## 2012-10-24 NOTE — Progress Notes (Signed)
BP 141/53  Pulse 94  Temp 99.3 F (37.4 C) (Oral)  Resp 22  Ht 5' 8.9" (1.75 m)  Wt 122 lb 12.7 oz (55.7 kg)  BMI 18.19 kg/m2  SpO2 93% Asked to reeval G-tube for leaking issues. Originally, PEG was placed by surgery on 10/2. He developed leaking issues a couple weeks after and had PEG injection showing good position.  We (IR) then were asked to replace the tube as it came out inadvertently. We upsized the tube to a 26Fr balloon retention.  He has had continued leaking issues. We recommended discontinuation of TF and TPN for a period of time to allow the tract to seal/close down some. His TF have been restarted and again, he is having leaking issues.  Upon exam today, it appears the skin opening and tract are larger than the tube and there is some play noted, which is allowing the leak. I deflated the balloon and reinflated to a full 10cc, then pulled the bumper snug, with only a thin layer of gauze between the bumper and skin. I'm not sure a skin suture would be of benefit, as it may just trap any leaking under the skin. The bumper needs to remain snug at all times.  We would be hesitant to upsize again as this only makes the hole and tract larger. Not sure if reconsulting surgery or WOCN would be helpful.  Does not seem to be an issue of gastroparesis as he does not have high residuals and does not c/o nausea when TF running. He is on Reglan 5mg  which seems fine. Will d/w MD  Brayton El PA-C

## 2012-10-24 NOTE — Plan of Care (Signed)
Problem: RH PAIN MANAGEMENT Goal: RH STG PAIN MANAGED AT OR BELOW PT'S PAIN GOAL <3  Outcome: Not Progressing Pain level at 10

## 2012-10-24 NOTE — Progress Notes (Signed)
Occupational  Therapy Note  Patient Details  Name: KAHLE MCQUEEN MRN: [id number] Date of Birth: April 18, 1931 Today's Date: 10/24/2012  1100-1200  ( 60 min) Individual therapy Pain  All over 9/10  Pt. Stated that he had already bathed and dressed with nursing due to an accident.  Agreed to get up in wc.  Addressed bed mobility, sitting balance, BUE AROM in grooming tasks, transfers, sitting tolerance.  Pt. Was minimal assist to go from supine to EOB.  Sat EOB for 5 minutes using BUE for support.  Transferred to wc with minimal assist plus assist with postural control.  Pt. Sat in wc and snapped 3 0ut of 3 snaps on gown with minimal assist.  He combed hair with moderate assist and brushed teeth with swab with mod assist.  Pt. Wanted to get back in bed after the session.  Transferred back to bed  With minimal assist.    Humberto Seals 10/24/2012, 11:17 AM

## 2012-10-24 NOTE — Progress Notes (Addendum)
Physical Therapy Weekly Progress Note  Patient Details  Name: [name] MRN: [id number] Date of Birth: 05-15-1931  Today's Date: 10/24/2012 Time: 1400-1430 Time Calculation (min): 30 min Individual therapy Pt requiring max encouragement to participate in part of the session. He reports having a terrible day and that he has just been poked all day long. Worked on bed mobility, transfers, standing balance, w/c mobility in the room, and OOB tolerance. After 20 min of being in the w/c, pt requested to return back to bed and end the therapy session. Requires S/steady A for mobility this session. Complained of stomach ache/nausea at end of session and notified RN.    Patient's STG = LTG's. D/c date has been delayed due to medical issues and primary caregiver has yet to be identified/compelted all formal training to care for patient. Pt's participation in therapy has improved, but still inconsistent with amount of participation and poor activity tolerance. Pt is currently at a min A level of care for transfers and gait with RW. Pt continues to require IV pole and O2 tank management as well.   Patient continues to demonstrate the following deficits: impaired balance, decreased endurance, decreased strength, impaired hand coordination/function and decreased functional mobility and therefore will continue to benefit from skilled PT intervention to enhance overall performance with activity tolerance, balance, ability to compensate for deficits, functional use of  right upper extremity and left upper extremity and coordination.  Patient progressing toward long term goals..  Continue plan of care.  PT Short Term Goals Week 3:  PT Short Term Goal 1 (Week 3): = LTGS Week 4:  PT Short Term Goal 1 (Week 4): = LTGS  Skilled Therapeutic Interventions/Progress Updates:  Wheelchair propulsion/positioning;Ambulation/gait training;Balance/vestibular training;Cognitive remediation/compensation;Discharge  planning;Community reintegration;DME/adaptive equipment instruction;Functional mobility training;Psychosocial support;Patient/family education;Pain management;Neuromuscular re-education;Skin care/wound management;Splinting/orthotics;Stair training;Therapeutic Activities;UE/LE Strength taining/ROM;UE/LE Coordination activities;Therapeutic Exercise   Therapy Documentation Precautions:  Precautions Precautions: Fall;Cervical Precaution Comments: reviewed precautions with pt's girlfriend who will be his primary caretaker.   Required Braces or Orthoses: Cervical Brace Cervical Brace: Hard collar Restrictions Weight Bearing Restrictions: No General: Amount of Missed PT Time (min): 30 Minutes Missed Time Reason: Patient unwilling/refused to participate without medical reason    See FIM for current functional status  Therapy/Group: Individual Therapy  Karolee Stamps [facility] 10/24/2012, 2:35 PM

## 2012-10-24 NOTE — Progress Notes (Signed)
Patient ID: DAID TRESCH, male   DOB: 06/20/1931, 78 y.o.   MRN: [id number]  When can I go home  A 12 point review of systems has been performed and if not noted above is otherwise negative.  Objective: Vital Signs: Blood pressure 153/57, pulse 78, temperature 97.9 F (36.6 C), temperature source Oral, resp. rate 22, height 5' 8.9" (1.75 m), weight 55.7 kg (122 lb 12.7 oz), SpO2 99.00%. No results found.  Basename 10/23/12 0500  WBC 6.9  HGB 10.0*  HCT 29.4*  PLT 108*    Basename 10/23/12 0500  NA 134*  K 3.6  CL 97  CO2 31  GLUCOSE 189*  BUN 28*  CREATININE 0.64  CALCIUM 8.6   CBG (last 3)   Basename 10/24/12 0406 10/24/12 0003 10/23/12 2004  GLUCAP 139* 105* 187*    Wt Readings from Last 3 Encounters:  10/24/12 55.7 kg (122 lb 12.7 oz)  10/02/12 56.4 kg (124 lb 5.4 oz)  10/02/12 56.4 kg (124 lb 5.4 oz)    Physical Exam:  Constitutional:  Patient with poor dental dentition  HENT:  Head: Normocephalic.  edentulous  Eyes:  Pupils round and reactive to light  Neck:  Cervical collar intact  Cardiovascular: Normal rate and regular rhythm.  Pulmonary/Chest: decreased breath sounds right base more than left. +/- effort. No cough at present. No distress Abdominal: Soft. Bowel sounds are normal. He exhibits no distension. No active drainage at tube site Musculoskeletal: He exhibits no edema. Some pain at right flank, into r iliac crest and left shoulder Neurological: He is alert.  Reasonable insight and awareness. Was joking with me on occasion. Intermittent confusion today  LUE and BUE are 3 to 3+/5.  LE's are grossly 3+ to 4/5. Sensation slightly diminished left greater than right upper extremities. Speech clearer. Voice is stronger. Skin:     Assessment/Plan: 1. Functional deficits secondary to C3-4 fx with central cord injury which require 3+ hours per day of interdisciplinary therapy in a comprehensive inpatient rehab setting. Physiatrist is providing close  team supervision and 24 hour management of active medical problems listed below. Physiatrist and rehab team continue to assess barriers to discharge/monitor patient progress toward functional and medical goals.  Had a "motivational" talk with him regarding participation and that if he wants to go home he has to give 100%  FIM: FIM - Bathing Bathing Steps Patient Completed: Chest;Right Arm;Left Arm;Abdomen;Right upper leg;Left upper leg Bathing: 3: Mod-Patient completes 5-7 27f 10 parts or 50-74%  FIM - Upper Body Dressing/Undressing Upper body dressing/undressing steps patient completed: Thread/unthread right sleeve of front closure shirt/dress;Thread/unthread left sleeve of front closure shirt/dress Upper body dressing/undressing: 0: Wears gown/pajamas-no public clothing FIM - Lower Body Dressing/Undressing Lower body dressing/undressing steps patient completed: Thread/unthread right pants leg (assisted with thread left leg and pull up pants) Lower body dressing/undressing: 1: Total-Patient completed less than 25% of tasks  FIM - Hotel manager Devices:  (stood at [employer] holding onto walker while OT held urinal) Toileting: 1: Total-Patient completed zero steps, helper did all 3  FIM - Diplomatic Services operational officer Devices: Grab bars;Walker Event organiser: 0-Activity did not occur  FIM - Banker Devices: Walker;HOB elevated Bed/Chair Transfer: 4: Supine > Sit: Min A (steadying Pt. > 75%/lift 1 leg);6: Sit > Supine: No assist (min assisf from flat mat table no rails.  )  FIM - Locomotion: Wheelchair Locomotion: Wheelchair: 0: Activity did not occur FIM - Locomotion: Ambulation Locomotion: Ambulation  Assistive Devices: Designer, industrial/product Ambulation/Gait Assistance: 4: Min guard Locomotion: Ambulation: 4: Travels 150 ft or more with minimal assistance (Pt.>75%)  Comprehension Comprehension Mode:  Auditory Comprehension: 5-Follows basic conversation/direction: With extra time/assistive device  Expression Expression Mode: Verbal Expression: 5-Expresses basic needs/ideas: With extra time/assistive device  Social Interaction Social Interaction: 5-Interacts appropriately 90% of the time - Needs monitoring or encouragement for participation or interaction.  Problem Solving Problem Solving: 5-Solves basic 90% of the time/requires cueing < 10% of the time  Memory Memory: 5-Recognizes or recalls 90% of the time/requires cueing < 10% of the time  Medical Problem List and Plan:  1. 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 DVT Prophylaxis/Anticoagulation: SCDs. Monitor for any signs of DVT  3. Pain Management: Tylenol and robaxin prn , add sports cream for painful areas 4. Mood/delirium/acute encephalopathy.    -as a whole better, still some hs confusion 5. Neuropsych: This patient is not capable of making decisions on his/her own behalf.  6. Dysphagia/fFEN. Status post gastrostomy tube placement, tube was accidentally removed when it was caught in the bed yesterday. A replacment tube with balloon rentention was placed  -TNA--stop after current bag completed  -increase TF gradually,2 5cc/hr q4hr x5  -give 1 liter ivf today and add h20 flushes via tube  -recheck lytes in am 7. Pneumonia-re check cxr on monday  -appreciate CCM recs. Monitor clinically. Check cxr's  -aspiration precautions  -IS, FV, OOB routinely with therapy 8. Hypothyroidism. Synthroid  9. Diabetes mellitus. -resumed lantus at 10u given elevated CBG's.  . Patient was on tradgenta 5 mg daily prior to admission   LOS (Days) 21 A FACE TO FACE EVALUATION WAS PERFORMED  KIRSTEINS,ANDREW E 10/24/2012, 6:48 AM

## 2012-10-24 NOTE — Progress Notes (Signed)
Speech Language Pathology Daily Session Note  Patient Details  Name: [name] MRN: [id number] Date of Birth: 04-14-1931  Today's Date: 10/24/2012 Time: 7829-5621 Time Calculation (min): 45 min  Short Term Goals: Week 3: SLP Short Term Goal 1 (Week 3): Pt will utilize extnernal memory aids to increase recall/carryover of newly learned information with Mod I.  SLP Short Term Goal 2 (Week 3): Pt will utilize increased vocal intensity and a slow rate to increase speech intelligibility at the sentence level to 100% intelligibility with Mod I.   Skilled Therapeutic Interventions: Treatment focus on anticipatory awareness and short-term memory. Pt continues to demonstrate decreased short-term memory and asking clinician the same questions in regards to discharge planning. Pt reeducated on current plan of care and verbalized understanding.  Pt sat EOB during session and demonstrated increased vocal intensity at the conversation level and was 100% intelligible. Pt requested to use the bathroom and demonstrated appropriate safety awareness by requesting his walker to stand.    FIM:  Comprehension Comprehension Mode: Auditory Comprehension: 5-Follows basic conversation/direction: With extra time/assistive device Expression Expression Mode: Verbal Expression: 5-Expresses basic needs/ideas: With extra time/assistive device Social Interaction Social Interaction: 6-Interacts appropriately with others with medication or extra time (anti-anxiety, antidepressant). Problem Solving Problem Solving: 5-Solves basic 90% of the time/requires cueing < 10% of the time Memory Memory: 4-Recognizes or recalls 75 - 89% of the time/requires cueing 10 - 24% of the time  Pain Pain Assessment Pain Assessment: No/denies pain Pain Score: Asleep  Therapy/Group: Individual Therapy  [name] 10/24/2012, 9:57 AM

## 2012-10-24 NOTE — Progress Notes (Signed)
Nutrition Follow-up  Intervention:   1. Once medically able, advance Osmolite 1.5 by 10 ml/hr every 8 hours, or as tolerated, to goal of 65 ml/hr x 20 hours daily 2. Add an additional 30 ml Prostat via tube daily to meet protein needs while TF advancement on hold 3. Resume free water flushes of 200 ml QID once IVF discontinued 4. RD to continue to follow nutrition care plan  Assessment:   D/C date pushed back 2/2 medical issues.  TPN discontinued on 10/24.  Pt is receiving Osmolite 1.5 at 40 ml/hr with 30 ml Prostat daily; per RN is tolerating well, tube site is possibly leaking, planning to hold TF at current rate. This provides 1540 kcal, 60 grams protein and 732 ml free water daily. This is meeting 81% kcal needs and 71% protein needs.  Diet Order:  NPO  Meds: Scheduled Meds:    . albuterol  2.5 mg Nebulization BID  . antiseptic oral rinse  15 mL Mouth Rinse QID  . aspirin  81 mg Per Tube Daily  . budesonide  0.5 mg Nebulization BID  . feeding supplement  30 mL Per Tube Daily  . insulin aspart  0-9 Units Subcutaneous Q4H  . insulin glargine  10 Units Subcutaneous BID  . ipratropium  0.5 mg Nebulization BID  . levothyroxine  88 mcg Per Tube QAC breakfast  . metoCLOPramide (REGLAN) injection  5 mg Intravenous Q8H  . Muscle Rub   Topical TID  . pantoprazole sodium  40 mg Per Tube BID  . predniSONE  10 mg Oral Q breakfast  . QUEtiapine  50 mg Oral QHS  . thiamine  100 mg Per Tube Daily   Continuous Infusions:    . sodium chloride 20 mL/hr at 10/20/12 0045  . sodium chloride 75 mL/hr at 10/24/12 0200  . feeding supplement (OSMOLITE 1.5 CAL) 1,000 mL (10/23/12 2043)   PRN Meds:.acetaminophen, acetaminophen, albuterol, bisacodyl, LORazepam, methocarbamol, ondansetron (ZOFRAN) IV, polyethylene glycol, sodium chloride, sodium chloride, sorbitol, traZODone  Labs:  CMP     Component Value Date/Time   NA 137 10/24/2012 0540   K 3.6 10/24/2012 0540   CL 100 10/24/2012 0540   CO2 31 10/24/2012 0540   GLUCOSE 161* 10/24/2012 0540   BUN 31* 10/24/2012 0540   CREATININE 0.61 10/24/2012 0540   CALCIUM 8.1* 10/24/2012 0540   PROT 6.0 10/23/2012 0500   ALBUMIN 1.9* 10/23/2012 0500   AST 32 10/23/2012 0500   ALT 25 10/23/2012 0500   ALKPHOS 132* 10/23/2012 0500   BILITOT 0.5 10/23/2012 0500   GFRNONAA >90 10/24/2012 0540   GFRAA >90 10/24/2012 0540   CBG (last 3)   Basename 10/24/12 0800 10/24/12 0406 10/24/12 0003  GLUCAP 154* 139* 105*     Intake/Output Summary (Last 24 hours) at 10/24/12 1029 Last data filed at 10/24/12 0811  Gross per 24 hour  Intake    300 ml  Output    700 ml  Net   -400 ml  BM 10/23  Weight Status:  55.7 kg/122 lb (10/25) - trending up  Body mass index is 18.19 kg/(m^2). Underweight  Re-estimated needs:  1900 - 2100 kcal, 85-100 grams protein  Nutrition Dx:  Swallowing difficulty r/t cervical fracture AEB PEG tube required for nutrition. Ongoing.  Goal:  Meet 90-100% estimated nutrition needs - unmet  Monitor:  TF advancement, weight trend, labs  Jarold Motto MS, RD, LDN Pager: [phone] After-hours pager: [phone]

## 2012-10-25 ENCOUNTER — Inpatient Hospital Stay (HOSPITAL_COMMUNITY): Payer: PRIVATE HEALTH INSURANCE | Admitting: Physical Therapy

## 2012-10-25 ENCOUNTER — Inpatient Hospital Stay (HOSPITAL_COMMUNITY): Payer: PRIVATE HEALTH INSURANCE | Admitting: *Deleted

## 2012-10-25 LAB — GLUCOSE, CAPILLARY: Glucose-Capillary: 149 mg/dL — ABNORMAL HIGH (ref 70–99)

## 2012-10-25 NOTE — Progress Notes (Signed)
Physical Therapy Note  Patient Details  Name: DOMANI BAKOS MRN: [id number] Date of Birth: 1931/06/15 Today's Date: 10/25/2012  1615-1640 (25 minutes) individual Pain : no reported pain  Oxygen sats 94% 2 L [place] (resting) Focus of treatment: Gait training/endurance Treatment: Pt agreed to take one walk. Supine to sit min assist with bed rails; sit to supine SBA ; gait 170 feet X 1 with RW min assist  followed by wc for safety. Returned to bed per patient request.   Danita Proud,JIM 10/25/2012, 8:08 AM

## 2012-10-25 NOTE — Progress Notes (Signed)
Patient ID: [name], male   DOB: 1931/11/21, 78 y.o.   MRN: [id number]  When can I go home- notified pt of D/C date Pt participating better  A 12 point review of systems has been performed and if not noted above is otherwise negative.  Objective: Vital Signs: Blood pressure 123/55, pulse 73, temperature 97.4 F (36.3 C), temperature source Axillary, resp. rate 18, height 5' 8.9" (1.75 m), weight 57.8 kg (127 lb 6.8 oz), SpO2 98.00%. No results found.  Basename 10/23/12 0500  WBC 6.9  HGB 10.0*  HCT 29.4*  PLT 108*    Basename 10/24/12 0540 10/23/12 0500  NA 137 134*  K 3.6 3.6  CL 100 97  CO2 31 31  GLUCOSE 161* 189*  BUN 31* 28*  CREATININE 0.61 0.64  CALCIUM 8.1* 8.6   CBG (last 3)   Basename 10/25/12 0405 10/25/12 10/24/12 1956  GLUCAP 149* 165* 109*    Wt Readings from Last 3 Encounters:  10/25/12 57.8 kg (127 lb 6.8 oz)  10/02/12 56.4 kg (124 lb 5.4 oz)  10/02/12 56.4 kg (124 lb 5.4 oz)    Physical Exam:  Constitutional:  Patient with poor dental dentition  HENT:  Head: Normocephalic.  edentulous  Eyes:  Pupils round and reactive to light  Neck:  Cervical collar intact  Cardiovascular: Normal rate and regular rhythm.  Pulmonary/Chest: decreased breath sounds right base more than left. +/- effort. No cough at present. No distress Abdominal: Soft. Bowel sounds are normal. He exhibits no distension. No active drainage at tube site Musculoskeletal: He exhibits no edema. Some pain at right flank, into r iliac crest and left shoulder Neurological: He is alert.  Reasonable insight and awareness. Was joking with me on occasion. Intermittent confusion today  LUE and BUE are 3 to 3+/5.  LE's are grossly 3+ to 4/5. Sensation slightly diminished left greater than right upper extremities. Speech clearer. Voice is stronger. Skin:     Assessment/Plan: 1. Functional deficits secondary to C3-4 fx with central cord injury which require 3+ hours per day of  interdisciplinary therapy in a comprehensive inpatient rehab setting. Physiatrist is providing close team supervision and 24 hour management of active medical problems listed below. Physiatrist and rehab team continue to assess barriers to discharge/monitor patient progress toward functional and medical goals.  FIM: FIM - Bathing Bathing Steps Patient Completed: Chest;Right Arm;Left Arm;Abdomen;Right upper leg;Left upper leg Bathing: 3: Mod-Patient completes 5-7 73f 10 parts or 50-74%  FIM - Upper Body Dressing/Undressing Upper body dressing/undressing steps patient completed: Thread/unthread right sleeve of front closure shirt/dress;Thread/unthread left sleeve of front closure shirt/dress Upper body dressing/undressing: 0: Wears gown/pajamas-no public clothing FIM - Lower Body Dressing/Undressing Lower body dressing/undressing steps patient completed: Thread/unthread right pants leg (assisted with thread left leg and pull up pants) Lower body dressing/undressing: 1: Total-Patient completed less than 25% of tasks  FIM - Hotel manager Devices:  (stood at [employer] holding onto walker while OT held urinal) Toileting: 1: Total-Patient completed zero steps, helper did all 3  FIM - Diplomatic Services operational officer Devices: Grab bars;Walker Event organiser: 0-Activity did not occur  FIM - Banker Devices: [employer] elevated;Walker Bed/Chair Transfer: 5: Supine > Sit: Supervision (verbal cues/safety issues);5: Sit > Supine: Supervision (verbal cues/safety issues);5: Bed > Chair or W/C: Supervision (verbal cues/safety issues);4: Chair or W/C > Bed: Min A (steadying Pt. > 75%)  FIM - Locomotion: Wheelchair Locomotion: Wheelchair: 1: Travels less than 50 ft with supervision,  cueing or coaxing FIM - Locomotion: Ambulation Locomotion: Ambulation Assistive Devices: Walker - Rolling Ambulation/Gait Assistance: 4: Min guard Locomotion:  Ambulation: 4: Travels 150 ft or more with minimal assistance (Pt.>75%)  Comprehension Comprehension Mode: Auditory Comprehension: 5-Follows basic conversation/direction: With extra time/assistive device  Expression Expression Mode: Verbal Expression: 5-Expresses basic needs/ideas: With extra time/assistive device  Social Interaction Social Interaction: 6-Interacts appropriately with others with medication or extra time (anti-anxiety, antidepressant).  Problem Solving Problem Solving: 5-Solves basic 90% of the time/requires cueing < 10% of the time  Memory Memory: 4-Recognizes or recalls 75 - 89% of the time/requires cueing 10 - 24% of the time  Medical Problem List and Plan:  1. 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 DVT Prophylaxis/Anticoagulation: SCDs. Monitor for any signs of DVT  3. Pain Management: Tylenol and robaxin prn , add sports cream for painful areas 4. Mood/delirium/acute encephalopathy.    -as a whole better, still some hs confusion 5. Neuropsych: This patient is not capable of making decisions on his/her own behalf.  6. Dysphagia/fFEN. Status post gastrostomy tube placement, tube was accidentally removed when it was caught in the bed yesterday. A replacment tube with balloon rentention was placed    -increase TF gradually,2 5cc/hr q4hr x5  -give 1 liter ivf today and add h20 flushes via tube   7. Pneumonia-re check cxr on monday  -appreciate CCM recs. Monitor clinically. Check cxr's  -aspiration precautions  -IS, FV, OOB routinely with therapy 8. Hypothyroidism. Synthroid  9. Diabetes mellitus. -resumed lantus at 10u given elevated CBG's.  . Patient was on tradgenta 5 mg daily prior to admission   LOS (Days) 22 A FACE TO FACE EVALUATION WAS PERFORMED  Maelys Kinnick E 10/25/2012, 6:48 AM

## 2012-10-26 ENCOUNTER — Inpatient Hospital Stay (HOSPITAL_COMMUNITY): Payer: PRIVATE HEALTH INSURANCE | Admitting: *Deleted

## 2012-10-26 LAB — GLUCOSE, CAPILLARY
Glucose-Capillary: 139 mg/dL — ABNORMAL HIGH (ref 70–99)
Glucose-Capillary: 144 mg/dL — ABNORMAL HIGH (ref 70–99)
Glucose-Capillary: 152 mg/dL — ABNORMAL HIGH (ref 70–99)
Glucose-Capillary: 111 mg/dL — ABNORMAL HIGH (ref 70–99)

## 2012-10-26 NOTE — Progress Notes (Signed)
Physical Therapy Note  Patient Details  Name: HERSCHEL FLEAGLE MRN: [id number] Date of Birth: 29-Oct-1931 Today's Date: 10/26/2012 Individual session Pain: Time:  1300-1345  (45 m in)  s  Agreed to get up in wc. Addressed bed mobility, sitting balance, lower body dressing, transfers,functional mobility. Pt. Was supervision to go from supine to EOB. Sat EOB for 5 minutes using BUE for support. Donned pants with mod assist.  Transferred to wc with minimal assist plus assist with postural control.   Pt. Ambulated from room and made one lap around the day room and back to room.  Agreed to sit in wc with controls by his side.     Humberto Seals 10/26/2012, 10:15 AM

## 2012-10-26 NOTE — Progress Notes (Signed)
Patient ID: [name], male   DOB: 12/07/1931, 78 y.o.   MRN: [id number]   A 12 point review of systems has been performed and if not noted above is otherwise negative.  Objective: Vital Signs: Blood pressure 170/65, pulse 70, temperature 98.9 F (37.2 C), temperature source Oral, resp. rate 18, height 5' 8.9" (1.75 m), weight 57.1 kg (125 lb 14.1 oz), SpO2 99.00%. No results found. No results found for this basename: WBC:2,HGB:2,HCT:2,PLT:2 in the last 72 hours  Basename 10/24/12 0540  NA 137  K 3.6  CL 100  CO2 31  GLUCOSE 161*  BUN 31*  CREATININE 0.61  CALCIUM 8.1*   CBG (last 3)   Basename 10/26/12 0440 10/26/12 0001 10/25/12 2006  GLUCAP 139* 129* 85    Wt Readings from Last 3 Encounters:  10/26/12 57.1 kg (125 lb 14.1 oz)  10/02/12 56.4 kg (124 lb 5.4 oz)  10/02/12 56.4 kg (124 lb 5.4 oz)    Physical Exam:  Constitutional:  Patient with poor dental dentition  HENT:  Head: Normocephalic.  edentulous  Eyes:  Pupils round and reactive to light  Neck:  Cervical collar intact  Cardiovascular: Normal rate and regular rhythm.  Pulmonary/Chest: decreased breath sounds right base more than left. +/- effort. No cough at present. No distress Abdominal: Soft. Bowel sounds are normal. He exhibits no distension. No active drainage at tube site Musculoskeletal: He exhibits no edema. Some pain at right flank, into r iliac crest and left shoulder Neurological: He is alert.  Reasonable insight and awareness. Was joking with me on occasion. Intermittent confusion today  LUE and BUE are 3 to 3+/5.  LE's are grossly 3+ to 4/5. Sensation slightly diminished left greater than right upper extremities. Speech clearer. Voice is stronger. Skin:     Assessment/Plan: 1. Functional deficits secondary to C3-4 fx with central cord injury which require 3+ hours per day of interdisciplinary therapy in a comprehensive inpatient rehab setting. Physiatrist is providing close team  supervision and 24 hour management of active medical problems listed below. Physiatrist and rehab team continue to assess barriers to discharge/monitor patient progress toward functional and medical goals.  FIM: FIM - Bathing Bathing Steps Patient Completed: Chest;Right Arm;Left Arm;Abdomen;Right upper leg;Left upper leg Bathing: 3: Mod-Patient completes 5-7 63f 10 parts or 50-74%  FIM - Upper Body Dressing/Undressing Upper body dressing/undressing steps patient completed: Thread/unthread right sleeve of front closure shirt/dress;Thread/unthread left sleeve of front closure shirt/dress Upper body dressing/undressing: 0: Wears gown/pajamas-no public clothing FIM - Lower Body Dressing/Undressing Lower body dressing/undressing steps patient completed: Thread/unthread right pants leg (assisted with thread left leg and pull up pants) Lower body dressing/undressing: 0: Wears gown/pajamas-no public clothing  FIM - Hotel manager Devices:  (stood at [employer] onto walker while OT held urinal) Toileting: 1: Total-Patient completed zero steps, helper did all 3  FIM - Diplomatic Services operational officer Devices: Psychiatrist Transfers: 4-To toilet/BSC: Min A (steadying Pt. > 75%);4-From toilet/BSC: Min A (steadying Pt. > 75%)  FIM - Banker Devices: [facility] elevated;Walker Bed/Chair Transfer: 5: Supine > Sit: Supervision (verbal cues/safety issues);5: Sit > Supine: Supervision (verbal cues/safety issues);5: Bed > Chair or W/C: Supervision (verbal cues/safety issues);4: Chair or W/C > Bed: Min A (steadying Pt. > 75%)  FIM - Locomotion: Wheelchair Locomotion: Wheelchair: 1: Travels less than 50 ft with supervision, cueing or coaxing FIM - Locomotion: Ambulation Locomotion: Ambulation Assistive Devices: Designer, industrial/product Ambulation/Gait Assistance: 4: Min guard Locomotion:  Ambulation: 4: Travels 150 ft or more with minimal  assistance (Pt.>75%)  Comprehension Comprehension Mode: Auditory Comprehension: 5-Understands basic 90% of the time/requires cueing < 10% of the time  Expression Expression Mode: Verbal Expression: 5-Expresses basic needs/ideas: With extra time/assistive device  Social Interaction Social Interaction: 6-Interacts appropriately with others with medication or extra time (anti-anxiety, antidepressant).  Problem Solving Problem Solving: 5-Solves complex 90% of the time/cues < 10% of the time  Memory Memory: 4-Recognizes or recalls 75 - 89% of the time/requires cueing 10 - 24% of the time  Medical Problem List and Plan:  1. 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 DVT Prophylaxis/Anticoagulation: SCDs. Monitor for any signs of DVT  3. Pain Management: Tylenol and robaxin prn , add sports cream for painful areas 4. Mood/delirium/acute encephalopathy.    -as a whole better, still some hs confusion 5. Neuropsych: This patient is not capable of making decisions on his/her own behalf.  6. Dysphagia/fFEN. Status post gastrostomy tube placement, tube was accidentally removed when it was caught in the bed yesterday. A replacment tube with balloon rentention was placed    -increase TF gradually,2 5cc/hr q4hr x5  -give 1 liter ivf today and add h20 flushes via tube   7. Pneumonia-re check cxr on monday  -appreciate CCM recs. Monitor clinically. Check cxr's  -aspiration precautions  -IS, FV, OOB routinely with therapy 8. Hypothyroidism. Synthroid  9. Diabetes mellitus. -resumed lantus at 10u given elevated CBG's.  . Patient was on tradjenta 5 mg daily prior to admission  10.  HTN hi systolic this am monitor off meds  LOS (Days) 23 A FACE TO FACE EVALUATION WAS PERFORMED  Zubayr Bednarczyk E 10/26/2012, 6:39 AM

## 2012-10-27 ENCOUNTER — Inpatient Hospital Stay (HOSPITAL_COMMUNITY): Payer: PRIVATE HEALTH INSURANCE | Admitting: Speech Pathology

## 2012-10-27 ENCOUNTER — Inpatient Hospital Stay (HOSPITAL_COMMUNITY): Payer: PRIVATE HEALTH INSURANCE

## 2012-10-27 ENCOUNTER — Inpatient Hospital Stay (HOSPITAL_COMMUNITY): Payer: PRIVATE HEALTH INSURANCE | Admitting: Occupational Therapy

## 2012-10-27 DIAGNOSIS — IMO0002 Reserved for concepts with insufficient information to code with codable children: Secondary | ICD-10-CM

## 2012-10-27 DIAGNOSIS — R1310 Dysphagia, unspecified: Secondary | ICD-10-CM

## 2012-10-27 DIAGNOSIS — S069X9A Unspecified intracranial injury with loss of consciousness of unspecified duration, initial encounter: Secondary | ICD-10-CM

## 2012-10-27 DIAGNOSIS — J690 Pneumonitis due to inhalation of food and vomit: Secondary | ICD-10-CM

## 2012-10-27 LAB — GLUCOSE, CAPILLARY
Glucose-Capillary: 138 mg/dL — ABNORMAL HIGH (ref 70–99)
Glucose-Capillary: 126 mg/dL — ABNORMAL HIGH (ref 70–99)
Glucose-Capillary: 125 mg/dL — ABNORMAL HIGH (ref 70–99)
Glucose-Capillary: 133 mg/dL — ABNORMAL HIGH (ref 70–99)

## 2012-10-27 MED ORDER — FREE WATER
200.0000 mL | Freq: Four times a day (QID) | Status: DC
  Administered 2012-10-27 – 2012-11-03 (×25): 200 mL

## 2012-10-27 MED ORDER — PRO-STAT SUGAR FREE PO LIQD
30.0000 mL | Freq: Every day | ORAL | Status: DC
  Administered 2012-10-28 – 2012-11-03 (×7): 30 mL
  Filled 2012-10-27 (×8): qty 30

## 2012-10-27 MED ORDER — COLLAGENASE 250 UNIT/GM EX OINT
TOPICAL_OINTMENT | Freq: Every day | CUTANEOUS | Status: DC
  Administered 2012-10-28 – 2012-11-03 (×7): via TOPICAL
  Filled 2012-10-27 (×4): qty 30

## 2012-10-27 MED ORDER — OSMOLITE 1.5 CAL PO LIQD
1000.0000 mL | ORAL | Status: DC
  Administered 2012-10-28 – 2012-11-03 (×3): 1000 mL
  Filled 2012-10-27 (×12): qty 1000

## 2012-10-27 NOTE — Progress Notes (Signed)
Patient ID: [name], male   DOB: [birth year], 78 y.o.   MRN: [id number] Slept well. "ready to go home"  A 12 point review of systems has been performed and if not noted above is otherwise negative.  Objective: Vital Signs: Blood pressure 129/55, pulse 76, temperature 97.7 F (36.5 C), temperature source Oral, resp. rate 20, height 5' 8.9" (1.75 m), weight 56.7 kg (125 lb), SpO2 100.00%. No results found. No results found for this basename: WBC:2,HGB:2,HCT:2,PLT:2 in the last 72 hours No results found for this basename: NA:2,K:2,CL:2,CO2:2,GLUCOSE:2,BUN:2,CREATININE:2,CALCIUM:2 in the last 72 hours CBG (last 3)   Basename 10/27/12 0358 10/27/12 0116 10/26/12 2020  GLUCAP 126* 138* 111*    Wt Readings from Last 3 Encounters:  10/27/12 56.7 kg (125 lb)  10/02/12 56.4 kg (124 lb 5.4 oz)  10/02/12 56.4 kg (124 lb 5.4 oz)    Physical Exam:  Constitutional:  Patient with poor dental dentition  HENT:  Head: Normocephalic.  edentulous  Eyes:  Pupils round and reactive to light  Neck:  Cervical collar intact  Cardiovascular: Normal rate and regular rhythm.  Pulmonary/Chest: decreased breath sounds right base more than left. +/- effort. No cough at present. No distress Abdominal: Soft. Bowel sounds are normal. He exhibits no distension. positive drainage at tube site Musculoskeletal: He exhibits no edema. Some pain at right flank, into r iliac crest and left shoulder Neurological: He is alert.  Reasonable insight and awareness. Was joking with me on occasion. Intermittent confusion today  LUE and BUE are 3 to 3+/5.  LE's are grossly 3+ to 4/5. Sensation slightly diminished left greater than right upper extremities. Speech clearer. Voice is stronger. Skin:     Assessment/Plan: 1. Functional deficits secondary to C3-4 fx with central cord injury which require 3+ hours per day of interdisciplinary therapy in a comprehensive inpatient rehab setting. Physiatrist is providing close  team supervision and 24 hour management of active medical problems listed below. Physiatrist and rehab team continue to assess barriers to discharge/monitor patient progress toward functional and medical goals.  FIM: FIM - Bathing Bathing Steps Patient Completed: Chest;Right Arm;Left Arm Bathing: 2: Max-Patient completes 3-4 36f 10 parts or 25-49%  FIM - Upper Body Dressing/Undressing Upper body dressing/undressing steps patient completed: Thread/unthread right sleeve of front closure shirt/dress;Thread/unthread left sleeve of front closure shirt/dress Upper body dressing/undressing: 0: Wears gown/pajamas-no public clothing FIM - Lower Body Dressing/Undressing Lower body dressing/undressing steps patient completed: Thread/unthread right pants leg (assisted with thread left leg and pull up pants) Lower body dressing/undressing: 0: Wears gown/pajamas-no public clothing  FIM - Hotel manager Devices:  (stood at [employer] onto walker while OT held urinal) Toileting: 1: Total-Patient completed zero steps, helper did all 3  FIM - Diplomatic Services operational officer Devices: Psychiatrist Transfers: 4-To toilet/BSC: Min A (steadying Pt. > 75%);4-From toilet/BSC: Min A (steadying Pt. > 75%)  FIM - Banker Devices: [facility] elevated;Walker Bed/Chair Transfer: 5: Supine > Sit: Supervision (verbal cues/safety issues);5: Sit > Supine: Supervision (verbal cues/safety issues);5: Bed > Chair or W/C: Supervision (verbal cues/safety issues);4: Chair or W/C > Bed: Min A (steadying Pt. > 75%)  FIM - Locomotion: Wheelchair Locomotion: Wheelchair: 1: Travels less than 50 ft with supervision, cueing or coaxing FIM - Locomotion: Ambulation Locomotion: Ambulation Assistive Devices: Designer, industrial/product Ambulation/Gait Assistance: 4: Min guard Locomotion: Ambulation: 4: Travels 150 ft or more with minimal assistance  (Pt.>75%)  Comprehension Comprehension Mode: Auditory Comprehension: 5-Understands basic 90% of  the time/requires cueing < 10% of the time  Expression Expression Mode: Verbal Expression: 5-Expresses basic needs/ideas: With extra time/assistive device  Social Interaction Social Interaction: 6-Interacts appropriately with others with medication or extra time (anti-anxiety, antidepressant).  Problem Solving Problem Solving: 5-Solves basic 90% of the time/requires cueing < 10% of the time  Memory Memory: 4-Recognizes or recalls 75 - 89% of the time/requires cueing 10 - 24% of the time  Medical Problem List and Plan:  1. 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 DVT Prophylaxis/Anticoagulation: SCDs. Monitor for any signs of DVT  3. Pain Management: Tylenol and robaxin prn , add sports cream for painful areas 4. Mood/delirium/acute encephalopathy.    -as a whole better, still some hs confusion 5. Neuropsych: This patient is not capable of making decisions on his/her own behalf.  6. Dysphagia/fFEN. Status post gastrostomy tube placement, tube was accidentally removed when it was caught in the bed yesterday. A replacment tube with balloon rentention was placed    -increase TF to 60cc/hr  -h20 flushes  -bmet tomorrow  -need to discuss with team as tube site still draining formula  7. Pneumonia-re check cxr today  -appreciate CCM recs. Monitor clinically. Check cxr's  -aspiration precautions  -IS, FV, OOB routinely with therapy 8. Hypothyroidism. Synthroid  9. Diabetes mellitus. -resumed lantus at 10u given elevated CBG's.  . Patient was on tradjenta 5 mg daily prior to admission  10.  HTN hi systolic this am monitor off meds  LOS (Days) 24 A FACE TO FACE EVALUATION WAS PERFORMED  Jacqeline Broers T 10/27/2012, 7:15 AM

## 2012-10-27 NOTE — Progress Notes (Addendum)
Peg site continues to leak; continuous flow at times; leaks with movement, coughing.  Surrounding skin red, tender, moist; 'Replicare' patch placed to protect skin, drain sponges over patch, (change drain sponges only, leave replicare on 3-5 days.) Unstageable  PU at coccyx with thick yellow tissue.  Peri- wound dark red approx. 1.5cms . Very tender.  PAC Dan Anguilli made aware request for santyl. Pt also has Stage 2 at left nare from O2 tubing.  Measures 1 x .25x.25.  Small piece of duoderm placed. See wound FS. Dietician made aware no free H2O flushes, order received.  Carlean Purl 1630; Peg site dressing changed, saturated x 3 today( see above) Dr Riley Kill aware; monitor. Carlean Purl

## 2012-10-27 NOTE — Progress Notes (Signed)
Physical Therapy Session Note  Patient Details  Name: [name] MRN: [id number] Date of Birth: 1931/04/21  Today's Date: 10/27/2012 Time: 1345-1420 Time Calculation (min): 35 min  Short Term Goals: Week 4:  PT Short Term Goal 1 (Week 4): = LTGS  Skilled Therapeutic Interventions/Progress Updates:    Treatment focused on functional mobility training including bed mobility (from flat surface and with HOB elevated), transfers with and without AD, gait to/from therapy with steady A using RW, and stair negotiation with steady A. Pt with report with need to urinate, so returned to room and used urinal with standing balance as close S using bilateral UE support. Pt refused to finish therapy session and repositioned in the bed in sidelying for pressure relief.   Throughout session, pt on room air and O2 remained 91-93% with activity.   Therapy Documentation Precautions:  Precautions Precautions: Fall;Cervical Precaution Comments: reviewed precautions with pt's girlfriend who will be his primary caretaker.   Required Braces or Orthoses: Cervical Brace Cervical Brace: Hard collar Restrictions Weight Bearing Restrictions: No General: Amount of Missed PT Time (min): 25 Minutes Missed Time Reason: Patient unwilling/refused to participate without medical reason Pain:  c/o pain on sacrum. Repositioned as able.   Locomotion : Ambulation Ambulation/Gait Assistance: 4: Min guard   See FIM for current functional status  Therapy/Group: Individual Therapy  Karolee Stamps [facility] 10/27/2012, 2:36 PM

## 2012-10-27 NOTE — Progress Notes (Signed)
Speech Language Pathology Session & Weekly Progress Notes  Patient Details  Name: [name] MRN: [id number] Date of Birth: 13-Jul-1931  Today's Date: 10/27/2012 Time: 4098-1191 Time Calculation (min): 45 min  Short Term Goals: Week 3: SLP Short Term Goal 1 (Week 3): Pt will utilize extnernal memory aids to increase recall/carryover of newly learned information with Mod I.  SLP Short Term Goal 1 - Progress (Week 3): Not met SLP Short Term Goal 2 (Week 3): Pt will utilize increased vocal intensity and a slow rate to increase speech intelligibility at the sentence level to 100% intelligibility with Mod I.  SLP Short Term Goal 2 - Progress (Week 3): Met  New Short Term Goals:  Week 4: SLP Short Term Goal 1 (Week 4): Pt will demonstrate complex problem solving with Mod I SLP Short Term Goal 2 (Week 4): Pt will utilize memory compensatory strategies to increase recall with Mod I.   Weekly Progress Updates: Pt continues to make functional gains and has met 1 out of 2 STG's this reporting period. Currently, pt is overall supervision-min A for utilization of memory compensatory strategies for recall of new, daily information and for complex problem solving.  Pt has improved overall speech intelligibility and voice intensity and is ~100% intelligible at the conversation level. Pt would benefit from continued skilled SLP intervention to maximize overall cognitive function and independence.     SLP Frequency: 1-2 X/day, 30-60 minutes Estimated Length of Stay: TBD SLP Treatment/Interventions: Cognitive remediation/compensation;Cueing hierarchy;Functional tasks;Environmental controls;Internal/external aids;Therapeutic Activities;Patient/family education  Daily Session Skilled Therapeutic Intervention: Treatment focus on complex problem solving and organization with new learning task with a card game. Pt required Min verbal and semantic cues to complete task.  FIM:  Comprehension Comprehension  Mode: Auditory Comprehension: 5-Understands complex 90% of the time/Cues < 10% of the time Expression Expression Mode: Verbal Expression: 5-Expresses complex 90% of the time/cues < 10% of the time Social Interaction Social Interaction: 6-Interacts appropriately with others with medication or extra time (anti-anxiety, antidepressant). Problem Solving Problem Solving: 5-Solves basic 90% of the time/requires cueing < 10% of the time Memory Memory: 4-Recognizes or recalls 75 - 89% of the time/requires cueing 10 - 24% of the time FIM - Eating Eating Activity: 1: Helper performs IV, parenteral, or tube feeding Pain No/Denies Pain  Therapy/Group: Individual Therapy  Jennafer Gladue 10/27/2012, 3:52 PM

## 2012-10-27 NOTE — Progress Notes (Signed)
Occupational Therapy Session Note & Weekly Progress Note  Patient Details  Name: [name] MRN: [id number] Date of Birth: [birth year]  Today's Date: 10/27/2012 Time: 1100-1155 Time Calculation (min): 55 min Patient found supine in bed. Patient engaged in bed mobility to sit edge of bed for ADL retraining. Patient performed UB/LB bathing edge of bed in sit->stand position. Focused skilled intervention on overall activity tolerance/endurance, sit/stands, functional use of bilateral UEs/hands, UB/LB bathing, functional ambulation using rolling walker, and fine motor exercises using theraputty. During rest 02=98% on room air, after activity patient 80% on room air. When 02 ats >90% therapist encouraged pursed lip breathing; sats did not rise to 90 therefore therapist donned 2 liters through nasal cannula. RN made aware of patients decrease in 02 sats. At end of session left patient seated in w/c with quick release belt donned, phone, call bell, suction, and urinal within reach. Encouraged patient to work with theraputty to strengthen bilateral hands/fingers.   --------------------------------------------------------------------------------------------------------------  WEEKLY PROGRESS NOTE Due to estimated length of stay, patient's short term goals = long term goals. Patient has progressed over the past week. Patient continues to refuse wearing clothes routinely, "They make me hot.Marland KitchenMarland KitchenThey're too tight!". Therapist has been working with patient on functional use of bilateral UEs/hands through BADL activities and working to increase patient's overall activity tolerance/endurance and OOB schedule; patient tends to prefer staying in bed vs sitting up in w/c or recliner. No family has been present for family education. Patient's 02 sats remain below 90% when on room air; therefore nasal cannula with 2 liters of 02 needs to donned during therapy sessions. Patient continues to be NPO and has PEG tube for  nutrition.   Patient continues to demonstrate the following deficits: decreased overall activity tolerance/endurance, decreased functional use of bilateral hands/UEs, decreased independence with BADLs/IADLs, decreased independence with functional mobility, decreased independence with transfers. Therefore, patient will continue to benefit from skilled OT intervention to enhance overall performance with BADL, iADL and Reduce care partner burden.  See Patient's Care Plan for progression toward long term goals.  Patient progressing toward long term goals..  Continue plan of care.  Skilled Therapeutic Interventions/Progress Updates:  Balance/vestibular training;Cognitive remediation/compensation;Community reintegration;Discharge planning;DME/adaptive equipment instruction;Functional mobility training;Neuromuscular re-education;Pain management;Patient/family education;Psychosocial support;Self Care/advanced ADL retraining;Skin care/wound managment;Splinting/orthotics;Therapeutic Activities;Therapeutic Exercise;UE/LE Strength taining/ROM;UE/LE Coordination activities;Wheelchair propulsion/positioning   Precautions:  Precautions Precautions: Fall;Cervical Precaution Comments: reviewed precautions with pt's girlfriend who will be his primary caretaker.   Required Braces or Orthoses: Cervical Brace Cervical Brace: Hard collar Restrictions Weight Bearing Restrictions: No  See FIM for current functional status  Therapy/Group: Individual Therapy  [name] 10/27/2012, 12:08 PM

## 2012-10-27 NOTE — Progress Notes (Addendum)
Nutrition Follow-up  Intervention:   1. Decrease Prostat to daily to better meet nutrition needs now that tube feeding has been advanced to goal rate 2. Increase free water flushes of 200 ml QID to better meet hydration needs 3. RD to continue to follow nutrition care plan  Assessment:   Pt re-evaluated by Radiology for PEG site leaking. Radiology deflated and then reinflated the balloon. RD discussed enteral nutrition regimen with RN. She notes that pt is only receiving free water flushes of 100 cc TID. RD will increase flushes as appropriate.  Pt is receiving Osmolite 1.5 at 60 ml/hr with 30 ml Prostat BID. This provides 1920 kcal, 121 grams protein and 1098 ml free water daily. This is meeting 96% kcal needs and 100% protein needs.  Free water flushes: 100 ml TID - provides an additional 300 ml water daily  Diet Order:  NPO  Meds: Scheduled Meds:    . albuterol  2.5 mg Nebulization BID  . antiseptic oral rinse  15 mL Mouth Rinse QID  . aspirin  81 mg Per Tube Daily  . budesonide  0.5 mg Nebulization BID  . feeding supplement  30 mL Per Tube BID  . insulin aspart  0-9 Units Subcutaneous Q4H  . insulin glargine  10 Units Subcutaneous BID  . ipratropium  0.5 mg Nebulization BID  . levothyroxine  88 mcg Per Tube QAC breakfast  . metoCLOPramide (REGLAN) injection  5 mg Intravenous Q8H  . Muscle Rub   Topical TID  . pantoprazole sodium  40 mg Per Tube BID  . QUEtiapine  50 mg Oral QHS  . thiamine  100 mg Per Tube Daily   Continuous Infusions:    . sodium chloride 20 mL/hr at 10/26/12 2224  . feeding supplement (OSMOLITE 1.5 CAL) 1,000 mL (10/26/12 1812)   PRN Meds:.acetaminophen, acetaminophen, albuterol, bisacodyl, LORazepam, methocarbamol, ondansetron (ZOFRAN) IV, polyethylene glycol, sodium chloride, sodium chloride, sorbitol, traZODone  Labs:  CMP     Component Value Date/Time   NA 137 10/24/2012 0540   K 3.6 10/24/2012 0540   CL 100 10/24/2012 0540   CO2 31  10/24/2012 0540   GLUCOSE 161* 10/24/2012 0540   BUN 31* 10/24/2012 0540   CREATININE 0.61 10/24/2012 0540   CALCIUM 8.1* 10/24/2012 0540   PROT 6.0 10/23/2012 0500   ALBUMIN 1.9* 10/23/2012 0500   AST 32 10/23/2012 0500   ALT 25 10/23/2012 0500   ALKPHOS 132* 10/23/2012 0500   BILITOT 0.5 10/23/2012 0500   GFRNONAA >90 10/24/2012 0540   GFRAA >90 10/24/2012 0540   CBG (last 3)   Basename 10/27/12 0729 10/27/12 0358 10/27/12 0116  GLUCAP 125* 126* 138*     Intake/Output Summary (Last 24 hours) at 10/27/12 1146 Last data filed at 10/27/12 1032  Gross per 24 hour  Intake    940 ml  Output    450 ml  Net    490 ml  BM 10/24  Weight Status:  56.7 kg/125 lb (10/25) - trending up  Body mass index is 18.51 kg/(m^2). Weight is WNL.  Re-estimated needs:  1900 - 2100 kcal, 85-100 grams protein  Nutrition Dx:  Swallowing difficulty r/t cervical fracture AEB PEG tube required for nutrition. Ongoing.  Goal:  Meet 90-100% estimated nutrition needs - met  Monitor:  TF tolerance, weight trend, labs  Jarold Motto MS, RD, LDN Pager: [phone] After-hours pager: [phone]

## 2012-10-28 ENCOUNTER — Inpatient Hospital Stay (HOSPITAL_COMMUNITY): Payer: PRIVATE HEALTH INSURANCE

## 2012-10-28 ENCOUNTER — Inpatient Hospital Stay (HOSPITAL_COMMUNITY): Payer: PRIVATE HEALTH INSURANCE | Admitting: Occupational Therapy

## 2012-10-28 ENCOUNTER — Inpatient Hospital Stay (HOSPITAL_COMMUNITY): Payer: PRIVATE HEALTH INSURANCE | Admitting: Speech Pathology

## 2012-10-28 DIAGNOSIS — IMO0002 Reserved for concepts with insufficient information to code with codable children: Secondary | ICD-10-CM

## 2012-10-28 DIAGNOSIS — S069X9A Unspecified intracranial injury with loss of consciousness of unspecified duration, initial encounter: Secondary | ICD-10-CM

## 2012-10-28 DIAGNOSIS — J690 Pneumonitis due to inhalation of food and vomit: Secondary | ICD-10-CM

## 2012-10-28 DIAGNOSIS — S069XAA Unspecified intracranial injury with loss of consciousness status unknown, initial encounter: Secondary | ICD-10-CM

## 2012-10-28 DIAGNOSIS — R1310 Dysphagia, unspecified: Secondary | ICD-10-CM

## 2012-10-28 LAB — BASIC METABOLIC PANEL
Calcium: 7.8 mg/dL — ABNORMAL LOW (ref 8.4–10.5)
Creatinine, Ser: 0.55 mg/dL (ref 0.50–1.35)
GFR calc Af Amer: >90 mL/min (ref >90)

## 2012-10-28 LAB — GLUCOSE, CAPILLARY
Glucose-Capillary: 115 mg/dL — ABNORMAL HIGH (ref 70–99)
Glucose-Capillary: 202 mg/dL — ABNORMAL HIGH (ref 70–99)
Glucose-Capillary: 181 mg/dL — ABNORMAL HIGH (ref 70–99)
Glucose-Capillary: 93 mg/dL (ref 70–99)

## 2012-10-28 LAB — CBC
MCH: 28.9 pg (ref 26.0–34.0)
MCHC: 32 g/dL (ref 30.0–36.0)
MCV: 90.2 fL (ref 78.0–100.0)
Platelets: 100 10*3/uL — ABNORMAL LOW (ref 150–400)
RDW: 14.5 % (ref 11.5–15.5)

## 2012-10-28 MED ORDER — FENTANYL CITRATE 0.05 MG/ML IJ SOLN: 25.0000 ug | INTRAMUSCULAR | Status: DC | PRN

## 2012-10-28 MED ORDER — FENTANYL CITRATE 0.05 MG/ML IJ SOLN
25.0000 ug | Freq: Once | INTRAMUSCULAR | Status: AC
  Administered 2012-10-28: 50 ug via INTRAVENOUS

## 2012-10-28 MED ORDER — IOHEXOL 300 MG/ML  SOLN
100.0000 mL | Freq: Once | INTRAMUSCULAR | Status: AC | PRN
  Administered 2012-10-28: 45 mL via INTRAVENOUS

## 2012-10-28 MED ORDER — SODIUM CHLORIDE 0.9 % IV BOLUS (SEPSIS)
500.0000 mL | Freq: Once | INTRAVENOUS | Status: AC
  Administered 2012-10-28: 500 mL via INTRAVENOUS

## 2012-10-28 NOTE — Progress Notes (Signed)
Patient ID: [name], male   DOB: 1931-12-17, 78 y.o.   MRN: [id number] Request received for conversion of G tube to G-J due to persistent leaking around entry site. G tube originally placed by CCS and replaced by IR on 10/14/2012 with 26 F balloon retention tube after becoming dislodged. Pt has had problems with ongoing drainage of feeds around entry site since. Above plans d/w pt/pt's daughter/POA, [name], with their understanding and consent. Will tent plan to do exchange today.

## 2012-10-28 NOTE — Progress Notes (Signed)
Called by primary RN to see pt for episode of unresponsiveness after mobilizing. On arrival pt was reclining in bed A & O with VSS.  Pt had loss of bowel & felt the urge to have another BM.  Primary RN to call MD to update.

## 2012-10-28 NOTE — Progress Notes (Signed)
Patient ID: [name], male   DOB: November 18, 1931, 78 y.o.   MRN: [id number] Called by CIR to evaluate G tube site. He initially had a PEG placed by Dr. Lindie Spruce. He has a tube in place now that was placed by IR. He continues to have leakage around it.  On exam there is duoderm around the site.  There is only a small amount of leakage presently.  The flange was loose allowing a lot of play.  We tightened this up and placed a new dressing.  Recommend continuing duoderm and if keeping flange tight does not help, may consider having IR upsize tube.  I am uncertain this would help, though, and it may be necessary to just put up with the drainage until his swallowing function improves. Violeta Gelinas, MD, MPH, FACS Pager: [phone]

## 2012-10-28 NOTE — Progress Notes (Signed)
Patient ID: [name], male   DOB: 14-Jun-1931, 78 y.o.   MRN: [id number] Slept well. "ready to go home". G tube site still with significant drainage  A 12 point review of systems has been performed and if not noted above is otherwise negative.  Objective: Vital Signs: Blood pressure 144/62, pulse 72, temperature 97.3 F (36.3 C), temperature source Oral, resp. rate 18, height 5' 8.9" (1.75 m), weight 58.3 kg (128 lb 8.5 oz), SpO2 98.00%. Dg Chest 2 View  10/27/2012  *RADIOLOGY REPORT*  Clinical Data: Pneumonia  CHEST - 2 VIEW  Comparison: 10/20/2012  Findings: There is a right arm PICC line with tip in the SVC. Heart size appears normal.  No pleural effusion or edema.  Interval progression of right lower lobe airspace consolidation.  Review of the visualized osseous structures is unremarkable.  IMPRESSION:  1.  Interval progression of right lower lobe pneumonia.   Original Report Authenticated By: Rosealee Albee, M.D.    No results found for this basename: WBC:2,HGB:2,HCT:2,PLT:2 in the last 72 hours No results found for this basename: NA:2,K:2,CL:2,CO2:2,GLUCOSE:2,BUN:2,CREATININE:2,CALCIUM:2 in the last 72 hours CBG (last 3)   Basename 10/28/12 0446 10/28/12 0014 10/27/12 2006  GLUCAP 115* 119* 133*    Wt Readings from Last 3 Encounters:  10/28/12 58.3 kg (128 lb 8.5 oz)  10/02/12 56.4 kg (124 lb 5.4 oz)  10/02/12 56.4 kg (124 lb 5.4 oz)    Physical Exam:  Constitutional:  Patient with poor dental dentition  HENT:  Head: Normocephalic.  edentulous  Eyes:  Pupils round and reactive to light  Neck:  Cervical collar intact  Cardiovascular: Normal rate and regular rhythm.  Pulmonary/Chest: decreased breath sounds right base more than left. +/- effort. No cough at present. No distress Abdominal: Soft. Bowel sounds are normal. He exhibits no distension. sig drainage at tube site Musculoskeletal: He exhibits no edema. Some pain at right flank, into r iliac crest and left  shoulder Neurological: He is alert.  Reasonable insight and awareness. Was joking with me on occasion. Intermittent confusion today  LUE and BUE are 3 to 3+/5.  LE's are grossly   4/5. Sensation slightly diminished left greater than right upper extremities. Speech clearer. Voice is stronger. Skin:     Assessment/Plan: 1. Functional deficits secondary to C3-4 fx with central cord injury which require 3+ hours per day of interdisciplinary therapy in a comprehensive inpatient rehab setting. Physiatrist is providing close team supervision and 24 hour management of active medical problems listed below. Physiatrist and rehab team continue to assess barriers to discharge/monitor patient progress toward functional and medical goals.  Once we settle G tube issue, he can go home.  FIM: FIM - Bathing Bathing Steps Patient Completed: Chest;Right Arm;Left Arm;Abdomen;Front perineal area;Buttocks;Right upper leg;Left upper leg Bathing: 4: Min-Patient completes 8-9 47f 10 parts or 75+ percent  FIM - Upper Body Dressing/Undressing Upper body dressing/undressing steps patient completed: Thread/unthread right sleeve of front closure shirt/dress;Thread/unthread left sleeve of front closure shirt/dress Upper body dressing/undressing: 0: Activity did not occur (patient refused) FIM - Lower Body Dressing/Undressing Lower body dressing/undressing steps patient completed: Thread/unthread right pants leg (assisted with thread left leg and pull up pants) Lower body dressing/undressing: 0: Activity did not occur (patient refused)  FIM - Toileting Toileting Assistive Devices:  (stood at [employer] onto walker while OT held urinal) Toileting: 0: Activity did not occur  FIM - Diplomatic Services operational officer Devices: Psychiatrist Transfers: 0-Activity did not occur  FIM -  Bed/Chair Transport planner Devices: Therapist, occupational: 6: Supine > Sit: No assist;6: Sit >  Supine: No assist;5: Bed > Chair or W/C: Supervision (verbal cues/safety issues);5: Chair or W/C > Bed: Supervision (verbal cues/safety issues)  FIM - Locomotion: Wheelchair Locomotion: Wheelchair: 0: Activity did not occur FIM - Locomotion: Ambulation Locomotion: Ambulation Assistive Devices: Designer, industrial/product Ambulation/Gait Assistance: 4: Min guard Locomotion: Ambulation: 4: Travels 150 ft or more with minimal assistance (Pt.>75%)  Comprehension Comprehension Mode: Auditory Comprehension: 5-Understands complex 90% of the time/Cues < 10% of the time  Expression Expression Mode: Verbal Expression: 5-Expresses complex 90% of the time/cues < 10% of the time  Social Interaction Social Interaction: 6-Interacts appropriately with others with medication or extra time (anti-anxiety, antidepressant).  Problem Solving Problem Solving: 5-Solves basic 90% of the time/requires cueing < 10% of the time  Memory Memory: 4-Recognizes or recalls 75 - 89% of the time/requires cueing 10 - 24% of the time  Medical Problem List and Plan:  1. 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 DVT Prophylaxis/Anticoagulation: SCDs. Monitor for any signs of DVT  3. Pain Management: Tylenol and robaxin prn , add sports cream for painful areas 4. Mood/delirium/acute encephalopathy.    -as a whole better, 5. Neuropsych: This patient is not capable of making decisions on his/her own behalf.  6. Dysphagia/fFEN. Status post gastrostomy tube placement, tube was accidentally removed when it was caught in the bed yesterday. A replacment tube with balloon rentention was placed    -increased TF to 60cc/hr and leakage has increased.   -h20 flushes  -bmet today  -need to discuss with team as tube site still draining formula--may need j tube  -i asked WOC RN for recs.  7. Pneumonia-re check cxr with some progression of RLL infiltrate, clinically improved. Chest sounds good  -CCM  follow up  -aspiration precautions  -IS, FV, OOB routinely with therapy 8. Hypothyroidism. Synthroid  9. Diabetes mellitus. -resumed lantus at 10u given elevated CBG's.  . Patient was on tradjenta 5 mg daily prior to admission  10.  HTN hi systolic this am monitor off meds  LOS (Days) 25 A FACE TO FACE EVALUATION WAS PERFORMED  Fatime Biswell T 10/28/2012, 7:27 AM

## 2012-10-28 NOTE — Progress Notes (Signed)
Patient was wheeled in the hall in his wheelchair and complained of feeling dizzy. Upon arrival to his room the patient was unable to get out of the wheelchair and back to bed. It was noted that the patient lost control of urine and eyes began to roll back in his head. Rapid response was paged and came to the room. It was also noted that patient had a bowel movement. Bp was taking and BS. BP was 90/50 and BS 97. O2 sat on rm air was 86%. O2 applied. Rapid response  assessment is that the patient "vagal down" and needed a fluid bolus. Marissa Nestle PA paged. Order given. Will continue to monitor per shift.

## 2012-10-28 NOTE — Progress Notes (Signed)
Occupational Therapy Session Note  Patient Details  Name: [name] MRN: [id number] Date of Birth: 04/03/1931  Today's Date: 10/28/2012 Time: 1100-1145 Time Calculation (min): 45 min  Short Term Goals: Week 1:  OT Short Term Goal 1 (Week 1): Pt will sit EOB to don shirt with steady A OT Short Term Goal 1 - Progress (Week 1): Not met OT Short Term Goal 2 (Week 1): Pt will don shirt with mod A OT Short Term Goal 2 - Progress (Week 1): Not met OT Short Term Goal 3 (Week 1): Pt will transfer to toilet / BSC with mod A OT Short Term Goal 3 - Progress (Week 1): Met OT Short Term Goal 4 (Week 1): Pt and therapist will explore options for donning LB clothing with AE/ apaptive methods OT Short Term Goal 4 - Progress (Week 1): Not met OT Short Term Goal 5 (Week 1): Pt will be able to pull down pants for toileting with steady A OT Short Term Goal 5 - Progress (Week 1): Not met  Week 2:  OT Short Term Goal 1 (Week 2): Patient will tolerate being OOB, sitting in w/c or recliner, for at least an hour OT Short Term Goal 1 - Progress (Week 2): Met OT Short Term Goal 2 (Week 2): Patient will attempt to wash face using modified wash cloth, if needed, with set-up assistance OT Short Term Goal 2 - Progress (Week 2): Met OT Short Term Goal 3 (Week 2): Therapist will complete PROM exercises to bilateral shoulders, elbows, forearms, wrist, and fingers up to patients tolerance level at least 4times a week in order to stretch muscles and help with strenthening weak UEs OT Short Term Goal 3 - Progress (Week 2): Met OT Short Term Goal 4 (Week 2): Patient will perform BSC transfer with min assist OT Short Term Goal 4 - Progress (Week 2): Met  Week 3:  OT Short Term Goal 1 (Week 3): Short Term Goals = Long Term Goals  Skilled Therapeutic Interventions/Progress Updates:  Patient found supine in bed, aggravated about being in hospital. Patient required max encouragement to get OOB for therapy session.  Patient willing to go to therapy gym, but refused any ADL. Engaged in bed mobility for patient to stand with rolling walker, patient then started ambulating to w/c and had incontinent urine episode. Therapist then assisted patient with donning new gown and new socks. Patient transferred into w/c after clean-up and therapist propelled patient to therapy gym to use SCIFIT machine to work on patients UE strength and help increase patient's overall endurance. Patient insisted on going back to bed after ~ 6 minutes on machine. Therapist assisted patient back to room and back to bed; patient missed 15 minutes of skilled OT secondary to request to get back to bed, "I just can't do it anymore". Left patient supine in bed with bed alarm set, suction within reach, and call bell within reach.   Precautions:  Precautions Precautions: Fall;Cervical Precaution Comments: reviewed precautions with pt's girlfriend who will be his primary caretaker.   Required Braces or Orthoses: Cervical Brace Cervical Brace: Hard collar Restrictions Weight Bearing Restrictions: No  See FIM for current functional status  Therapy/Group: Individual Therapy  Mykaela Arena 10/28/2012, 12:03 PM

## 2012-10-28 NOTE — Consult Note (Signed)
Wound care consult requested prior to CCS involvement.  This team just completed assessment of G tube site and recommended DuoDerm for plan of care with tightened flange.  Progress notes indicate that if leaking continues to be a problem, then they can be re-consulted for possible tube replacement. Will not plan to follow further unless re-consulted.  [address], RN, MSN, [employer]  [phone]

## 2012-10-28 NOTE — Progress Notes (Signed)
1515 Call received from Radiology.  Verbal order to hold TF; preperation for planned J-Tube placement/procedure this afternoon.  Estimated time: 1 hour per Radiology Dept.  TF held.

## 2012-10-28 NOTE — Progress Notes (Signed)
Physical Therapy Session Note  Patient Details  Name: [name] MRN: [id number] Date of Birth: 05/19/1931  Today's Date: 10/28/2012 Time: 1445-1530 Time Calculation (min): 45 min  Short Term Goals: Week 4:  PT Short Term Goal 1 (Week 4): = LTGS  Skilled Therapeutic Interventions/Progress Updates:   Treatment focused on functional transfers, dynamic gait and balance through obstacle course including turns, stepping over objects, and stair negotiation, car transfers with RW and close S overall. Pt with good participation in session. Plan to go down for procedure shortly for tube placement; returned to bed at end of session.  Therapy Documentation Precautions:  Precautions Precautions: Fall;Cervical Precaution Comments: reviewed precautions with pt's girlfriend who will be his primary caretaker.   Required Braces or Orthoses: Cervical Brace Cervical Brace: Hard collar Restrictions Weight Bearing Restrictions: No   Pain: C/o discomfort on buttocks - repositioned as able.   Locomotion : Ambulation Ambulation/Gait Assistance: 5: Supervision   See FIM for current functional status  Therapy/Group: Individual Therapy  Karolee Stamps [facility] 10/28/2012, 3:50 PM

## 2012-10-28 NOTE — Progress Notes (Signed)
Speech Language Pathology Daily Session Note  Patient Details  Name: [name] MRN: [id number] Date of Birth: [birth year]  Today's Date: 10/28/2012 Time: 0850-0930 Time Calculation (min): 40 min  Short Term Goals: Week 4: SLP Short Term Goal 1 (Week 4): Pt will demonstrate complex problem solving with Mod I SLP Short Term Goal 2 (Week 4): Pt will utilize memory compensatory strategies to increase recall with Mod I.   Skilled Therapeutic Interventions: Treatment focus on cognitive goals. Pt demonstrated appropriate problem solving for basic and functional tasks with overall Mod I. Pt also requested to sit EOB and sit up in his wheelchair after the session had ended. Pt continues to demonstrate decreased short-term memory and recall of new information in regards to current medical status and discharge planning. Pt required overall Min A verbal and semantic cues to increase recall.    FIM:  Comprehension Comprehension Mode: Auditory Comprehension: 5-Follows basic conversation/direction: With extra time/assistive device Expression Expression Mode: Verbal Expression: 5-Expresses complex 90% of the time/cues < 10% of the time Social Interaction Social Interaction: 6-Interacts appropriately with others with medication or extra time (anti-anxiety, antidepressant). Problem Solving Problem Solving: 5-Solves basic 90% of the time/requires cueing < 10% of the time Memory Memory: 4-Recognizes or recalls 75 - 89% of the time/requires cueing 10 - 24% of the time  Pain Pain Assessment Pain Assessment: No/denies pain Pain Score: 0-No pain  Therapy/Group: Individual Therapy  Shiza Thelen 10/28/2012, 11:46 AM

## 2012-10-29 ENCOUNTER — Inpatient Hospital Stay (HOSPITAL_COMMUNITY): Payer: PRIVATE HEALTH INSURANCE | Admitting: Speech Pathology

## 2012-10-29 ENCOUNTER — Inpatient Hospital Stay (HOSPITAL_COMMUNITY): Payer: PRIVATE HEALTH INSURANCE

## 2012-10-29 DIAGNOSIS — S069XAA Unspecified intracranial injury with loss of consciousness status unknown, initial encounter: Secondary | ICD-10-CM

## 2012-10-29 DIAGNOSIS — R1310 Dysphagia, unspecified: Secondary | ICD-10-CM

## 2012-10-29 DIAGNOSIS — IMO0002 Reserved for concepts with insufficient information to code with codable children: Secondary | ICD-10-CM

## 2012-10-29 DIAGNOSIS — S069X9A Unspecified intracranial injury with loss of consciousness of unspecified duration, initial encounter: Secondary | ICD-10-CM

## 2012-10-29 DIAGNOSIS — J690 Pneumonitis due to inhalation of food and vomit: Secondary | ICD-10-CM

## 2012-10-29 LAB — GLUCOSE, CAPILLARY
Glucose-Capillary: 245 mg/dL — ABNORMAL HIGH (ref 70–99)
Glucose-Capillary: 162 mg/dL — ABNORMAL HIGH (ref 70–99)
Glucose-Capillary: 197 mg/dL — ABNORMAL HIGH (ref 70–99)
Glucose-Capillary: 128 mg/dL — ABNORMAL HIGH (ref 70–99)

## 2012-10-29 MED ORDER — METOCLOPRAMIDE HCL 5 MG PO TABS
5.0000 mg | ORAL_TABLET | Freq: Three times a day (TID) | ORAL | Status: DC
  Administered 2012-10-30 – 2012-11-03 (×12): 5 mg
  Filled 2012-10-29 (×16): qty 1

## 2012-10-29 NOTE — Patient Care Conference (Signed)
Inpatient RehabilitationTeam Conference Note Date: 10/28/2012   Time: 2:15 PM    Patient Name: [name]      Medical Record Number: [id number]  Date of Birth: 04-04-1931 Sex: Male         Room/Bed: 4001/4001-01 Payor Info: Payor: Advertising copywriter MEDICARE  Plan: [facility]  Product Type: *No Product type*     Admitting Diagnosis: central cord peg  Admit Date/Time:  10/03/2012  2:45 PM Admission Comments: No comment available   Primary Diagnosis:  Central cord syndrome Principal Problem: Central cord syndrome  Patient Active Problem List   Diagnosis Date Noted  . HAP (hospital-acquired pneumonia) 10/07/2012  . Central cord syndrome 10/03/2012  . Cervical spine fracture 09/26/2012  . Respiratory failure, post-operative 09/26/2012  . HCC (hepatocellular carcinoma) 04/08/2012  . 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 DM (diabetes mellitus) 04/08/2012  . Hypertension 04/08/2012  . Hypothyroid 04/08/2012  . Alcohol abuse, in remission 04/08/2012    Expected Discharge Date: Expected Discharge Date: 10/31/12  Team Members Present: Physician: Dr. Faith Rogue Social Worker Present: Amada Jupiter, LCSW Nurse Present: Other (comment) Berta Minor, RN) PT Present: Karolee Stamps, PT OT Present: Mackie Pai, OT;Patricia Mat Carne, OT SLP Present: Feliberto Gottron, SLP Other (Discipline and Name): Tora Duck, PPS Coordinator     Current Status/Progress Goal Weekly Team Focus  Medical   tf restarted, continued drainage around peg. pneumonia radiographically persistent  discuss with IR, WOC RN means of controlling drainage  see chart   Bowel/Bladder      Voiding in urinal. Continent of bowell LBM 10/23/12. Patient refusing laxatives  min assist   Swallow/Nutrition/ Hydration   NPO  N/A  N/A   ADL's   min assist for UB/LB bathing, patient refuses clothes at this time..  supervision -min assist  BADLs, ADL transfers, functional use of bilateral UEs, overall activity tolerance/endurance,  OOB tolerance   Mobility   steady A/close S  S/min A overall  increasing participation, dynamic balance and gait; family education/discharge planning   Communication   Mod I  Supervision  Goals Met   Safety/Cognition/ Behavioral Observations  Min A  Min A  recall of new, daily information   Pain   pain to left shoulder/rignt hip- muscle rub  lesss than 3      Skin   posterior cervical incisio with steris. Sacrum with bruised coccyx.   no additional skin breakdown         *See Interdisciplinary Assessment and Plan and progress notes for long and short-term goals  Barriers to Discharge: medical conditions    Possible Resolutions to Barriers:  see prior    Discharge Planning/Teaching Needs:  Plan still remains to be home with daughter and pt's sisters providing 24/7 care, however, medical concerns continue and daughter is seeking clarification of medical course/ plan from here.  Dr. Riley Kill aware.      Team Discussion:  Plan to replace tube and plan for d/c end of week after family education.  Pt reaching supervision to minimal assistance levels.    Revisions to Treatment Plan:  None   Continued Need for Acute Rehabilitation Level of Care: The patient requires daily medical management by a physician with specialized training in physical medicine and rehabilitation for the following conditions: Daily direction of a multidisciplinary physical rehabilitation program to ensure safe treatment while eliciting the highest outcome that is of practical value to the patient.: Yes Daily medical management of patient stability for increased activity during participation in an intensive rehabilitation regime.: Yes  Daily analysis of laboratory values and/or radiology reports with any subsequent need for medication adjustment of medical intervention for : Neurological problems;Other;Post surgical problems;Pulmonary problems  [name] 10/29/2012, 11:29 AM

## 2012-10-29 NOTE — Progress Notes (Signed)
Occupational Therapy Note  Patient Details  Name: [name] MRN: [id number] Date of Birth: 10-22-1931 Today's Date: 10/29/2012  Time: 4696-2952 Pt denies pain except when resting on buttocks Individual Therapy Pt in bed resting and with encouragement agreed to participate in therapy.  Pt stated that all he wanted to do was walk.  Pt stated that he did not need/want to  Bathe this morning.  Pt ambulated with RW into hallway.  Pt's briefs kept falling down and pt stated he was getting tired and needed to go lay back down in bed.  Attempted to engage pt in BUE exercises but pt state he was too tired and needed to lay down.  Focus on activity tolerance and participation.   Lavone Neri [facility] 10/29/2012, 10:57 AM

## 2012-10-29 NOTE — Progress Notes (Signed)
Patient ID: [name], male   DOB: 1931-06-05, 78 y.o.   MRN: [id number] Had a vagal episode yesterday when up. Pt came to, and no other issues. Slept well last night. GJ placed  A 12 point review of systems has been performed and if not noted above is otherwise negative.  Objective: Vital Signs: Blood pressure 116/54, pulse 69, temperature 98.2 F (36.8 C), temperature source Oral, resp. rate 19, height 5' 8.9" (1.75 m), weight 58.3 kg (128 lb 8.5 oz), SpO2 99.00%. Dg Chest 2 View  10/27/2012  *RADIOLOGY REPORT*  Clinical Data: Pneumonia  CHEST - 2 VIEW  Comparison: 10/20/2012  Findings: There is a right arm PICC line with tip in the SVC. Heart size appears normal.  No pleural effusion or edema.  Interval progression of right lower lobe airspace consolidation.  Review of the visualized osseous structures is unremarkable.  IMPRESSION:  1.  Interval progression of right lower lobe pneumonia.   Original Report Authenticated By: Rosealee Albee, M.D.     Basename 10/28/12 0855  WBC 5.5  HGB 8.3*  HCT 25.9*  PLT 100*    Basename 10/28/12 0855  NA 134*  K 3.9  CL 99  CO2 32  GLUCOSE 223*  BUN 18  CREATININE 0.55  CALCIUM 7.8*   CBG (last 3)   Basename 10/29/12 0403 10/28/12 2154 10/28/12 1954  GLUCAP 245* 92 76    Wt Readings from Last 3 Encounters:  10/28/12 58.3 kg (128 lb 8.5 oz)  10/02/12 56.4 kg (124 lb 5.4 oz)  10/02/12 56.4 kg (124 lb 5.4 oz)    Physical Exam:  Constitutional:  Patient with poor dental dentition  HENT:  Head: Normocephalic.  edentulous  Eyes:  Pupils round and reactive to light  Neck:  Cervical collar intact  Cardiovascular: Normal rate and regular rhythm.  Pulmonary/Chest: decreased breath sounds right base more than left. +/- effort. No cough at present. No distress Abdominal: Soft. Bowel sounds are normal. He exhibits no distension. GJ site dry Musculoskeletal: He exhibits no edema. Some pain at right flank, into r iliac crest and  left shoulder Neurological: He is alert.  Reasonable insight and awareness. Was joking with me on occasion. Intermittent confusion today  LUE and BUE are   3+ to 4/5.  LE's are grossly   4/5. Sensation slightly diminished left greater than right upper extremities. Speech clearer. Voice is stronger. Skin:     Assessment/Plan: 1. Functional deficits secondary to C3-4 fx with central cord injury which require 3+ hours per day of interdisciplinary therapy in a comprehensive inpatient rehab setting. Physiatrist is providing close team supervision and 24 hour management of active medical problems listed below. Physiatrist and rehab team continue to assess barriers to discharge/monitor patient progress toward functional and medical goals.  Once we settle G tube issue, he can go home.  FIM: FIM - Bathing Bathing Steps Patient Completed: Chest;Right Arm;Left Arm;Abdomen;Front perineal area;Buttocks;Right upper leg;Left upper leg Bathing: 4: Min-Patient completes 8-9 48f 10 parts or 75+ percent  FIM - Upper Body Dressing/Undressing Upper body dressing/undressing steps patient completed: Thread/unthread right sleeve of front closure shirt/dress;Thread/unthread left sleeve of front closure shirt/dress Upper body dressing/undressing: 0: Activity did not occur (patient refused) FIM - Lower Body Dressing/Undressing Lower body dressing/undressing steps patient completed: Thread/unthread right pants leg (assisted with thread left leg and pull up pants) Lower body dressing/undressing: 0: Activity did not occur (patient refused)  FIM - Toileting Toileting Assistive Devices:  (stood at [employer]  onto walker while OT held urinal) Toileting: 0: Activity did not occur  FIM - Diplomatic Services operational officer Devices: Bedside commode Toilet Transfers: 0-Activity did not occur  FIM - Banker Devices: Walker;HOB elevated Bed/Chair Transfer: 6: Supine >  Sit: No assist;6: Sit > Supine: No assist;5: Bed > Chair or W/C: Supervision (verbal cues/safety issues);5: Chair or W/C > Bed: Supervision (verbal cues/safety issues)  FIM - Locomotion: Wheelchair Locomotion: Wheelchair: 1: Travels less than 50 ft with supervision, cueing or coaxing FIM - Locomotion: Ambulation Locomotion: Ambulation Assistive Devices: Designer, industrial/product Ambulation/Gait Assistance: 5: Supervision Locomotion: Ambulation: 2: Travels 50 - 149 ft with supervision/safety issues  Comprehension Comprehension Mode: Auditory Comprehension: 5-Follows basic conversation/direction: With extra time/assistive device  Expression Expression Mode: Verbal Expression: 5-Expresses complex 90% of the time/cues < 10% of the time  Social Interaction Social Interaction: 6-Interacts appropriately with others with medication or extra time (anti-anxiety, antidepressant).  Problem Solving Problem Solving: 5-Solves basic 90% of the time/requires cueing < 10% of the time  Memory Memory: 4-Recognizes or recalls 75 - 89% of the time/requires cueing 10 - 24% of the time  Medical Problem List and Plan:  1. 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 DVT Prophylaxis/Anticoagulation: SCDs. Monitor for any signs of DVT  3. Pain Management: Tylenol and robaxin prn , add sports cream for painful areas 4. Mood/delirium/acute encephalopathy.    -as a whole better, 5. Neuropsych: This patient is not capable of making decisions on his/her own behalf.  6. Dysphagia/fFEN. Status post gastrostomy tube placement, tube was accidentally removed when it was caught in the bed yesterday.   -appreciate surgery and IR help.   -requested G-J tube which was replace both to help with drainage and to reduce his aspiration risk.  -i see no drainage at site today. 7. Pneumonia-re check cxr with some progression of RLL infiltrate, clinically improved. Chest sounds good  -CCM follow  up  -aspiration precautions  -IS, FV, OOB routinely with therapy 8. Hypothyroidism. Synthroid  9. Diabetes mellitus. -resumed lantus at 10u given elevated CBG's.  Amy need further adjustment before dc home . Patient was on tradjenta 5 mg daily prior to admission  10.  HTN hi systolic this am monitor off meds  11. Anemia: likely multifactorial given GI issues, etc  -recheck in am  -heme check stools  -PPI LOS (Days) 26 A FACE TO FACE EVALUATION WAS PERFORMED  Hollye Pritt T 10/29/2012, 6:21 AM

## 2012-10-29 NOTE — Progress Notes (Signed)
Social Work Patient ID: [name], male   DOB: 18-Feb-1931, 78 y.o.   MRN: [id number]  Met yesterday afternoon with patient and spoke with daughter, Marcelino Duster, this morning to review team conference.  Both agreeable with plan for d/c 11/1 with family education on 10/31.  Daughter has been able to speak with Dr. Riley Kill and feels her medical questions have been addressed.  Family has arranged to provide 24/7 assistance.  Will arrange f/u HH to begin with for RN and therapies.  Continue to follow.  Melondy Blanchard

## 2012-10-29 NOTE — Progress Notes (Signed)
Physical Therapy Session Note  Patient Details  Name: [name] MRN: [id number] Date of Birth: 03/27/1931  Today's Date: 10/29/2012 Time: 4098-1191 Time Calculation (min): 44 min  Short Term Goals: Week 4:  PT Short Term Goal 1 (Week 4): = LTGS  Skilled Therapeutic Interventions/Progress Updates:    Treatment focused on functional transfers from various surfaces, gait with RW for endurance and strengthening, dynamic standing balance activity to throw, bounce and catch ball (close S) and bed mobility. Rest breaks as needed but overall good participation. Pt excited to go home on Friday.  Therapy Documentation Precautions:  Precautions Precautions: Fall;Cervical  Required Braces or Orthoses: Cervical Brace Cervical Brace: Hard collar Restrictions Weight Bearing Restrictions: No   Pain:  No complaints.   Locomotion : Ambulation Ambulation/Gait Assistance: 5: Supervision   See FIM for current functional status  Therapy/Group: Individual Therapy  Karolee Stamps [facility] 10/29/2012, 4:24 PM

## 2012-10-29 NOTE — Progress Notes (Addendum)
Nutrition Follow-up  Intervention:   1. Continue current regimen 2. RD to continue to follow nutrition care plan  Assessment:   G-tube converted G-J tube yesterday. Noted pt had vagal episode yesterday. Per RN, pt is at goal rate with TF s/p G-J tube placement. Tolerating well.  Pt is receiving Osmolite 1.5 at 60 ml/hr with 30 ml Prostat BID. This provides 1920 kcal, 121 grams protein and 1098 ml free water daily. This is meeting 96% kcal needs and 100% protein needs.  Free water flushes: 200 ml QID - provides an additional 800 ml water daily  Diet Order:  NPO  Meds: Scheduled Meds:    . albuterol  2.5 mg Nebulization BID  . antiseptic oral rinse  15 mL Mouth Rinse QID  . aspirin  81 mg Per Tube Daily  . budesonide  0.5 mg Nebulization BID  . collagenase   Topical Daily  . feeding supplement  30 mL Per Tube Daily  . fentaNYL  25-50 mcg Intravenous Once  . free water  200 mL Per Tube QID  . insulin aspart  0-9 Units Subcutaneous Q4H  . insulin glargine  10 Units Subcutaneous BID  . ipratropium  0.5 mg Nebulization BID  . levothyroxine  88 mcg Per Tube QAC breakfast  . metoCLOPramide (REGLAN) injection  5 mg Intravenous Q8H  . Muscle Rub   Topical TID  . pantoprazole sodium  40 mg Per Tube BID  . QUEtiapine  50 mg Oral QHS  . sodium chloride  500 mL Intravenous Once  . thiamine  100 mg Per Tube Daily   Continuous Infusions:    . sodium chloride 20 mL/hr at 10/26/12 2224  . feeding supplement (OSMOLITE 1.5 CAL) 1,000 mL (10/28/12 1957)   PRN Meds:.acetaminophen, acetaminophen, albuterol, bisacodyl, fentaNYL, iohexol, LORazepam, methocarbamol, ondansetron (ZOFRAN) IV, polyethylene glycol, sodium chloride, sodium chloride, sorbitol, traZODone  Labs:  CMP     Component Value Date/Time   NA 134* 10/28/2012 0855   K 3.9 10/28/2012 0855   CL 99 10/28/2012 0855   CO2 32 10/28/2012 0855   GLUCOSE 223* 10/28/2012 0855   BUN 18 10/28/2012 0855   CREATININE 0.55 10/28/2012  0855   CALCIUM 7.8* 10/28/2012 0855   PROT 6.0 10/23/2012 0500   ALBUMIN 1.9* 10/23/2012 0500   AST 32 10/23/2012 0500   ALT 25 10/23/2012 0500   ALKPHOS 132* 10/23/2012 0500   BILITOT 0.5 10/23/2012 0500   GFRNONAA >90 10/28/2012 0855   GFRAA >90 10/28/2012 0855   CBG (last 3)   Basename 10/29/12 0830 10/29/12 0403 10/28/12 2154  GLUCAP 162* 245* 92     Intake/Output Summary (Last 24 hours) at 10/29/12 1112 Last data filed at 10/29/12 0830  Gross per 24 hour  Intake   1000 ml  Output    254 ml  Net    746 ml  BM 10/24  Weight Status:  55 kg/120 lb (10/30) - trending down  Body mass index is 19.30 kg/(m^2). Weight is WNL.  Re-estimated needs:  1900 - 2100 kcal, 85-100 grams protein  Nutrition Dx:  Swallowing difficulty r/t cervical fracture AEB PEG tube required for nutrition. Ongoing.  Goal:  Meet 90-100% estimated nutrition needs - met  Monitor:  TF tolerance, weight trend, labs  Jarold Motto MS, RD, LDN Pager: [phone] After-hours pager: [phone]

## 2012-10-29 NOTE — Progress Notes (Signed)
Speech Language Pathology Daily Session Note  Patient Details  Name: [name] MRN: [id number] Date of Birth: 1931-12-25  Today's Date: 10/29/2012 Time: 0900-0945 Time Calculation (min): 45 min  Short Term Goals: Week 3: SLP Short Term Goal 1 (Week 3): Pt will utilize extnernal memory aids to increase recall/carryover of newly learned information with Mod I.  SLP Short Term Goal 1 - Progress (Week 3): Not met SLP Short Term Goal 2 (Week 3): Pt will utilize increased vocal intensity and a slow rate to increase speech intelligibility at the sentence level to 100% intelligibility with Mod I.  SLP Short Term Goal 2 - Progress (Week 3): Met  Skilled Therapeutic Interventions:Treatment focus on anticipatory awareness for discharge planning. Pt required supervision question and verbal cues for safety awareness in regards to current swallowing recommendations at discharge and required extensive re-education of aspiration risk.   Pain No/Denies Pain  Therapy/Group: Individual Therapy  Darrien Laakso 10/29/2012, 4:35 PM

## 2012-10-30 ENCOUNTER — Inpatient Hospital Stay (HOSPITAL_COMMUNITY): Payer: PRIVATE HEALTH INSURANCE

## 2012-10-30 ENCOUNTER — Inpatient Hospital Stay (HOSPITAL_COMMUNITY): Payer: PRIVATE HEALTH INSURANCE | Admitting: Occupational Therapy

## 2012-10-30 ENCOUNTER — Inpatient Hospital Stay (HOSPITAL_COMMUNITY): Payer: PRIVATE HEALTH INSURANCE | Admitting: Speech Pathology

## 2012-10-30 LAB — BASIC METABOLIC PANEL
Calcium: 8 mg/dL — ABNORMAL LOW (ref 8.4–10.5)
GFR calc non Af Amer: >90 mL/min (ref >90)
Glucose, Bld: 145 mg/dL — ABNORMAL HIGH (ref 70–99)
Sodium: 136 mEq/L (ref 135–145)

## 2012-10-30 LAB — GLUCOSE, CAPILLARY
Glucose-Capillary: 153 mg/dL — ABNORMAL HIGH (ref 70–99)
Glucose-Capillary: 158 mg/dL — ABNORMAL HIGH (ref 70–99)
Glucose-Capillary: 171 mg/dL — ABNORMAL HIGH (ref 70–99)

## 2012-10-30 LAB — CBC
MCV: 89.2 fL (ref 78.0–100.0)
Platelets: 83 10*3/uL — ABNORMAL LOW (ref 150–400)
RDW: 14.5 % (ref 11.5–15.5)
WBC: 5 10*3/uL (ref 4.0–10.5)

## 2012-10-30 NOTE — Progress Notes (Signed)
Physical Therapy Discharge Summary  Patient Details  Name: [name] MRN: [id number] Date of Birth: [birth year]  Today's Date: 10/30/2012 Time: 5621-3086 Time Calculation (min): 38 min  Individual therapy session with focus on family education with pt and pt's daughter Marcelino Duster. Demonstrated and return demonstrated bed mobility, basic transfers with RW, gait with RW, stair negotiation, ramp/curb negotiation, and overall safety with mobility. Reviewed w/c parts management and breakdown. Pt declined further participation in session and returned to bed to rest and missed last few minutes. Daughter feels confident in level of assist required (close S/steady A) and reports having no further questions at this time.   Patient has met 12 of 12 long term goals due to improved activity tolerance, improved balance, improved postural control, increased strength, decreased pain, ability to compensate for deficits, functional use of  right upper extremity and left upper extremity, improved attention and improved awareness.  Patient to discharge at an ambulatory level Supervision.   Patient's care partner is independent to provide the necessary supervision/physical as needed assistance at discharge.  Reasons goals not met: n/a all goals met at this time.  Recommendation:  Patient will benefit from ongoing skilled PT services in home health setting to continue to advance safe functional mobility, address ongoing impairments in gait, balance, UE function, endurance, strength, functional mobility, and minimize fall risk.  Equipment: 16x16 w/c with basic cushion; pt reports already owning a RW  Reasons for discharge: treatment goals met and discharge from hospital  Patient/family agrees with progress made and goals achieved: Yes  PT Discharge Precautions/Restrictions Precautions Precautions: Fall;Cervical Required Braces or Orthoses: Cervical Brace Cervical Brace: Hard collar Restrictions Weight  Bearing Restrictions: No Pain  No complaints. Vision/Perception  Vision - History Baseline Vision: No visual deficits Patient Visual Report: No change from baseline Perception Perception: Within Functional Limits Praxis Praxis: Intact  Cognition Overall Cognitive Status: Appears within functional limits for tasks assessed Sensation Sensation Light Touch: Impaired by gross assessment Light Touch Impaired Details: Impaired RUE;Impaired LUE Stereognosis: Impaired by gross assessment Hot/Cold: Impaired by gross assessment Proprioception: Impaired by gross assessment Proprioception Impaired Details: Impaired RUE;Impaired LUE Coordination Gross Motor Movements are Fluid and Coordinated: No Fine Motor Movements are Fluid and Coordinated: No Coordination and Movement Description: patient's overall hand function has improved since admission, patient is able to functionally use bilateral hands for ADLs Motor  Motor Motor: Other (comment) (Central cord syndrome (UE's more affected))    Locomotion  Ambulation Ambulation/Gait Assistance: 5: Supervision  Trunk/Postural Assessment  Cervical Assessment Cervical Assessment: Exceptions to [facility] (cervical hard collar) Thoracic Assessment Thoracic Assessment:  (same as admission) Lumbar Assessment Lumbar Assessment: Within Functional Limits  Balance Static Sitting Balance Static Sitting - Level of Assistance: 6: Modified independent (Device/Increase time) Dynamic Sitting Balance Dynamic Sitting - Level of Assistance: 5: Stand by assistance Static Standing Balance Static Standing - Level of Assistance: 5: Stand by assistance Dynamic Standing Balance Dynamic Standing - Level of Assistance: 5: Stand by assistance (with RW) Extremity Assessment  RUE Assessment RUE Assessment: Exceptions to [facility] RUE AROM (degrees) Overall AROM Right Upper Extremity: Deficits;Due to pain RUE Strength RUE Overall Strength: Deficits;Due to pain RUE Overall  Strength Comments: same as admission, strength and ROM is decreased throughout LUE Assessment LUE Assessment: Exceptions to [facility] LUE AROM (degrees) Overall AROM Left Upper Extremity: Deficits;Due to pain LUE Strength LUE Overall Strength: Deficits;Due to pain LUE Overall Strength Comments: same as admission, strength and ROM is decreased throughout RLE Assessment RLE Assessment:  (decreased  muscular endurance; grossly 4/5) LLE Assessment LLE Assessment:  (decreased muscular endurance; grossly 4/5)  See FIM for current functional status  Karolee Stamps [facility] 10/30/2012, 4:25 PM
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
## 2012-10-30 NOTE — Discharge Summary (Signed)
  Discharge summary job [id number]
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
## 2012-10-30 NOTE — Progress Notes (Signed)
Occupational Therapy Session Note & Discharge Summary  Patient Details  Name: [name] MRN: [id number] Date of Birth: 11-Feb-1931  Today's Date: 10/30/2012 Time: 0865-7846 Time Calculation (min): 45 min Patient found seated edge of bed, patient's daughter present in room. Treatment focus on family education to patient's daughter, Marcelino Duster. Educated daughter on bathing & dressing, sit/stands with rolling walker, functional mobility using rolling walker, safety with rolling walker, w/c management, toilet transfers, toileting, tub/shower transfers on/off tub transfer bench, and energy conservation. At end of session left patient and daughter in gym for next therapy session.   -------------------------------------------------------------------------------------------------------------------  DISCHARGE SUMMARY Patient has met 10 of 12 long term goals due to improved activity tolerance, improved balance, postural control, ability to compensate for deficits, functional use of  RIGHT upper, RIGHT lower, LEFT upper and LEFT lower extremity, improved attention, improved awareness and improved coordination.  Patient to discharge at overall supervision -> moderate assistance level.  Patient's daughter is independent to provide the necessary physical assistance at discharge.    Reasons goals not met: Patient did not meet LTGs of UB and LB dressing. Patient currently requires moderate assistance for UB and LB dressing. Patient's hand functions have improved during CIR stay, but patient continues to present with decreased strength and ROM throughout BUE/hands.   Recommendation:  Patient will benefit from ongoing skilled OT services in home health setting to continue to advance functional skills in the area of BADL, iADL and Reduce care partner burden.  Equipment: tub transfer bench and BSC  Reasons for discharge: treatment goals met and discharge from hospital  Patient/family agrees with progress  made and goals achieved: Yes  Precautions/Restrictions  Precautions Precautions: Fall;Cervical Required Braces or Orthoses: Cervical Brace Cervical Brace: Hard collar Restrictions Weight Bearing Restrictions: No  Vital Signs Therapy Vitals Temp: 98.6 F (37 C) Temp src: Oral Pulse Rate: 71  Resp: 19  BP: 101/63 mmHg Oxygen Therapy SpO2: 97 % O2 Device: None (Room air)  Vision/Perception  Vision - History Baseline Vision: No visual deficits Patient Visual Report: No change from baseline Perception Perception: Within Functional Limits Praxis Praxis: Intact   Cognition - See Discharge Navigator  Sensation Sensation Light Touch: Impaired by gross assessment Light Touch Impaired Details: Impaired RUE;Impaired LUE Stereognosis: Impaired by gross assessment Hot/Cold: Impaired by gross assessment Proprioception: Impaired by gross assessment Proprioception Impaired Details: Impaired RUE;Impaired LUE Coordination Gross Motor Movements are Fluid and Coordinated: No Fine Motor Movements are Fluid and Coordinated: No Coordination and Movement Description: patient's overall hand function has improved since admission, patient is able to functionally use bilateral hands for ADLs  Motor - See Discharge Navigator  Mobility - See Discharge Navigator  Trunk/Postural Assessment - See Discharge Navigator  Balance- See Discharge Navigator  Extremity/Trunk Assessment RUE Assessment RUE Assessment: Exceptions to [facility] RUE AROM (degrees) Overall AROM Right Upper Extremity: Deficits;Due to pain RUE Strength RUE Overall Strength: Deficits;Due to pain RUE Overall Strength Comments: same as admission, strength and ROM is decreased throughout LUE Assessment LUE Assessment: Exceptions to [facility] LUE AROM (degrees) Overall AROM Left Upper Extremity: Deficits;Due to pain LUE Strength LUE Overall Strength: Deficits;Due to pain LUE Overall Strength Comments: same as admission, strength and  ROM is decreased throughout  See FIM for current functional status  Tranise Forrest 10/30/2012, 3:44 PM

## 2012-10-30 NOTE — Progress Notes (Signed)
Social Work Patient ID: [name], male   DOB: 25-Feb-1931, 78 y.o.   MRN: [id number]  Spoke with patient's daughter following family education.  She and patient feel "ready" for d/c tomorrow.  They understand the [facility] follow up that I have in place with [facility] including peg feeding supplies.  Daughter to be here around 8:30 am to begin d/c process.  She confirms that she and the other family members are providing 24/7 supervision/ assistance to patient at home.  Maziah Keeling

## 2012-10-30 NOTE — Progress Notes (Signed)
Speech Language Pathology Session Note & Discharge Summary  Patient Details  Name: [name] MRN: [id number] Date of Birth: 03/24/1931  Today's Date: 10/30/2012 Time: 0865-7846 Time Calculation (min): 45 min  Skilled Therapeutic Intervention: Treatment focus on family education in regards to current cognitive and swallowing function. Pt is overall supervision-Mod I for basic cognitive tasks and pt's daughter educated on tasks/activities and strategies at home to utilize to maximize pt's overall cognitive function and physical activity. Pt's daughter also educated on pt's current NPO status, high aspiration risk and strict aspiration precautions (oral care 4 times daily, HOB 30 degrees, etc.). Pt also given handout and demonstration on how to appropriately perform swallowing exercises. Both pt and family verbalize and demonstrated understanding of all information presented. Additional handouts given reiterating swallowing education and NPO status.   Patient has met 4 of 4 long term goals.  Patient to discharge at overall Supervision level.   Reasons goals not met: All cognitive goals met, swallowing goals discontinued 10/17/12   Clinical Impression/Discharge Summary: Pt has met all LTG's this reporting period. Currently, pt is overall supervision-Mod I for selective attention, short-term memory, functional problem solving, and intellectual awareness. Pt's swallowing goals were discontinued 10/17/12 per MD orders due to decrease in medical function and worsening PNA with ice chip trials. Pt remains NPO and continues to remain a high aspiration risk and demonstrates a severe pharyngeal phase and moderate cervical esophageal phase dysphasia per MBSS 09/29/12. Pt administered ice chip trials on 10/30/12 at bedside and demonstrated overt coughing and throat clearing with all trials, as a result, repeat MBSS not warranted prior to discharge. Recommend pt continue NPO status until pt's overall strength  and pulmonary status improves. Pt may also benefit from NMES once cervical collar is discharged. Pt/family given handout of swallowing exercises to complete at home. Recommend f/u home health SLP intervention to maximize swallow function and assess readiness for PO trials. Pt/family education complete and pt will discharge home with 24 hour supervision.    Care Partner:  Caregiver Able to Provide Assistance: Yes  Type of Caregiver Assistance: Physical;Cognitive  Recommendation:  Home Health SLP  Rationale for SLP Follow Up: Maximize swallowing safety;Reduce caregiver burden   Equipment: N/A   Reasons for discharge: Treatment goals met;Discharged from hospital   Patient/Family Agrees with Progress Made and Goals Achieved: Yes   See FIM for current functional status  Leeba Barbe 10/30/2012, 4:18 PM

## 2012-10-30 NOTE — Progress Notes (Signed)
Speech Language Pathology Daily Session Note  Patient Details  Name: [name] MRN: [id number] Date of Birth: Nov 12, 1931  Today's Date: 10/30/2012 Time: 1045-1100 Time Calculation (min): 15 min  Short Term Goals: Week 4: SLP Short Term Goal 1 (Week 4): Pt will demonstrate complex problem solving with Mod I SLP Short Term Goal 2 (Week 4): Pt will utilize memory compensatory strategies to increase recall with Mod I.   Skilled Therapeutic Interventions: Treatment focus on trials ice chips to assess possible readiness for repeat MBSS. Pt administered oral care and given trials of ice chips and demonstrated delayed swallow initiation with immediate cough/throat clear with expectoration with all trials. Recommend continue NPO status and defer further dysphagia treatment to f/u home health SLP.    FIM:  Comprehension Comprehension Mode: Auditory Comprehension: 5-Understands basic 90% of the time/requires cueing < 10% of the time Expression Expression Mode: Verbal Expression: 5-Expresses complex 90% of the time/cues < 10% of the time Social Interaction Social Interaction: 6-Interacts appropriately with others with medication or extra time (anti-anxiety, antidepressant). Problem Solving Problem Solving: 5-Solves basic 90% of the time/requires cueing < 10% of the time Memory Memory: 5-Recognizes or recalls 90% of the time/requires cueing < 10% of the time FIM - Eating Eating Activity: 0: Activity did not occur  Pain Pain Assessment Pain Assessment: No/denies pain  Therapy/Group: Individual Therapy  Sabrinia Prien 10/30/2012, 11:37 AM

## 2012-10-31 ENCOUNTER — Encounter (HOSPITAL_COMMUNITY)
Admission: RE | Disposition: A | Discharge status: Other Acute Inpatient Hospital | Payer: Self-pay | Source: Ambulatory Visit | Attending: Physical Medicine & Rehabilitation

## 2012-10-31 ENCOUNTER — Encounter (HOSPITAL_COMMUNITY): Payer: Self-pay | Admitting: *Deleted

## 2012-10-31 DIAGNOSIS — R1312 Dysphagia, oropharyngeal phase: Secondary | ICD-10-CM | POA: Diagnosis not present

## 2012-10-31 DIAGNOSIS — I739 Peripheral vascular disease, unspecified: Secondary | ICD-10-CM | POA: Diagnosis present

## 2012-10-31 DIAGNOSIS — R1310 Dysphagia, unspecified: Secondary | ICD-10-CM

## 2012-10-31 DIAGNOSIS — IMO0002 Reserved for concepts with insufficient information to code with codable children: Secondary | ICD-10-CM

## 2012-10-31 DIAGNOSIS — S069X9A Unspecified intracranial injury with loss of consciousness of unspecified duration, initial encounter: Secondary | ICD-10-CM

## 2012-10-31 DIAGNOSIS — D649 Anemia, unspecified: Secondary | ICD-10-CM | POA: Diagnosis not present

## 2012-10-31 DIAGNOSIS — J690 Pneumonitis due to inhalation of food and vomit: Secondary | ICD-10-CM

## 2012-10-31 HISTORY — PX: ESOPHAGOGASTRODUODENOSCOPY: SHX5428

## 2012-10-31 LAB — GLUCOSE, CAPILLARY: Glucose-Capillary: 114 mg/dL — ABNORMAL HIGH (ref 70–99)

## 2012-10-31 LAB — CBC
HCT: 23.5 % — ABNORMAL LOW (ref 39.0–52.0)
MCH: 29.9 pg (ref 26.0–34.0)
MCV: 89 fL (ref 78.0–100.0)
RBC: 2.64 MIL/uL — ABNORMAL LOW (ref 4.22–5.81)
WBC: 5.3 10*3/uL (ref 4.0–10.5)

## 2012-10-31 SURGERY — EGD (ESOPHAGOGASTRODUODENOSCOPY)
Anesthesia: Moderate Sedation

## 2012-10-31 MED ORDER — MAGNESIUM OXIDE 400 (241.3 MG) MG PO TABS
400.0000 mg | ORAL_TABLET | Freq: Two times a day (BID) | ORAL | Status: DC
  Administered 2012-11-01 – 2012-11-03 (×5): 400 mg via ORAL
  Filled 2012-10-31 (×6): qty 1

## 2012-10-31 MED ORDER — FENTANYL CITRATE 0.05 MG/ML IJ SOLN
INTRAMUSCULAR | Status: AC
  Filled 2012-10-31: qty 2

## 2012-10-31 MED ORDER — FENTANYL CITRATE 0.05 MG/ML IJ SOLN
INTRAMUSCULAR | Status: DC | PRN
  Administered 2012-10-31 (×2): 12.5 ug via INTRAVENOUS

## 2012-10-31 MED ORDER — MIDAZOLAM HCL 10 MG/2ML IJ SOLN
INTRAMUSCULAR | Status: DC | PRN
  Administered 2012-10-31 (×2): 2 mg via INTRAVENOUS

## 2012-10-31 MED ORDER — ALTEPLASE 2 MG IJ SOLR
2.0000 mg | Freq: Once | INTRAMUSCULAR | Status: AC
  Administered 2012-10-31: 2 mg
  Filled 2012-10-31: qty 2

## 2012-10-31 MED ORDER — SACCHAROMYCES BOULARDII 250 MG PO CAPS
250.0000 mg | ORAL_CAPSULE | Freq: Two times a day (BID) | ORAL | Status: DC
  Administered 2012-10-31 – 2012-11-03 (×7): 250 mg via ORAL
  Filled 2012-10-31 (×10): qty 1

## 2012-10-31 MED ORDER — MIDAZOLAM HCL 5 MG/ML IJ SOLN
INTRAMUSCULAR | Status: AC
  Filled 2012-10-31: qty 2

## 2012-10-31 MED ORDER — SODIUM CHLORIDE 0.9 % IV SOLN
INTRAVENOUS | Status: DC
  Administered 2012-10-31: 500 mL via INTRAVENOUS

## 2012-10-31 MED ORDER — POTASSIUM CHLORIDE CRYS ER 20 MEQ PO TBCR
40.0000 meq | EXTENDED_RELEASE_TABLET | Freq: Once | ORAL | Status: AC
  Administered 2012-10-31: 40 meq via ORAL
  Filled 2012-10-31: qty 2

## 2012-10-31 NOTE — Op Note (Signed)
[facility] [address] [place], [address]   OPERATIVE PROCEDURE REPORT  PATIENT: [name], [name]  MR#: [id number] BIRTHDATE: 1931-06-06  GENDER: Male ENDOSCOPIST: Jeani Hawking, MD ASSISTANT:   Ara Kussmaul, technician PROCEDURE DATE: 10/31/2012 PROCEDURE:   EGD, diagnostic ASA CLASS:   Class III INDICATIONS:heme positive stool and anemia. MEDICATIONS: Versed 4 mg IV and Fentanyl 25 mcg IV TOPICAL ANESTHETIC:   none  DESCRIPTION OF PROCEDURE:   After the risks benefits and alternatives of the procedure were thoroughly explained, informed consent was obtained.  The Pentax Gastroscope [id number]  endoscope was introduced through the mouth  and advanced to the second portion of the duodenum Without limitations.      The instrument was slowly withdrawn as the mucosa was fully examined.    FINDINGS: Before the start of the procedure the patient was noted to be in Afib, which appears to be new.  Review of the prior EKGs were negative for afib.  The upper, middle and distal third of the esophagus were carefully inspected and no abnormalities were noted. The z-line was well seen at the GEJ.  The endoscope was pushed into the fundus which was normal including a retroflexed view.  The antrum, gastric body, first and second part of the duodenum were unremarkable.   Retroflexed views revealed no abnormalities. The scope was then withdrawn from the patient and the procedure terminated.  COMPLICATIONS: There were no complications. IMPRESSION: 1) Normal EGD with intact J-tube.  RECOMMENDATIONS: 1) Check EKG. 2) Consider Cardiology consult for the new onset afib. 3) Colonoscopy once the cardiac issues are stable/worked up.   _______________________________ eSigned:  Jeani Hawking, MD 10/31/2012 1:12 PM

## 2012-10-31 NOTE — Progress Notes (Signed)
Samuel Elnoria Howard, MD notified.  Tube feedings and medications to resume per verbal order.  MD does not wish to resume with colonoscopy until cardiology provides clearance prior to procedure.
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
## 2012-10-31 NOTE — Progress Notes (Signed)
Pt in Endo in afib/flutter--no mention of arrhythmia in h/p. Paged and spoke with Jesusita Oka, PA for rehab. Awaiting callback

## 2012-10-31 NOTE — Interval H&P Note (Signed)
History and Physical Interval Note:  10/31/2012 12:17 PM  [name]  has presented today for surgery, with the diagnosis of Anemia and Heme positive stool  The various methods of treatment have been discussed with the patient and family. After consideration of risks, benefits and other options for treatment, the patient has consented to  Procedure(s) (LRB) with comments: ESOPHAGOGASTRODUODENOSCOPY (EGD) (N/A) as a surgical intervention .  The patient's history has been reviewed, patient examined, no change in status, stable for surgery.  I have reviewed the patient's chart and labs.  Questions were answered to the patient's satisfaction.     Roshaunda Starkey D

## 2012-10-31 NOTE — Progress Notes (Signed)
1130 Pt. Escorted to Endoscopy via transport.  Hall pass signed.

## 2012-10-31 NOTE — Progress Notes (Signed)
4540 Tube feeding held; per verbal order from Sherol Dade, PA. Patient to be scheduled for gastro-procedure.
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
## 2012-10-31 NOTE — Progress Notes (Signed)
Nutrition Follow-up  Intervention:   1. Resume TF regimen once medically appropriate per MD 2. RD to continue to follow nutrition care plan  Assessment:   Pt with drop in Hgb from 8.4 to 7.9. Plan for EGD today per RN and PA. TF on hold at this time. Discharge has been postponed.  Pt was receiving Osmolite 1.5 at 60 ml/hr with 30 ml Prostat BID. This provides 1920 kcal, 121 grams protein and 1098 ml free water daily. This is meeting 96% kcal needs and 100% protein needs. RN reports pt was tolerating TF regimen well prior to TF being held.  Free water flushes: 200 ml QID - provides an additional 800 ml water daily  Diet Order:  NPO  Meds: Scheduled Meds:    . albuterol  2.5 mg Nebulization BID  . alteplase  2 mg Intracatheter Once  . alteplase  2 mg Intracatheter Once  . antiseptic oral rinse  15 mL Mouth Rinse QID  . aspirin  81 mg Per Tube Daily  . budesonide  0.5 mg Nebulization BID  . collagenase   Topical Daily  . feeding supplement  30 mL Per Tube Daily  . free water  200 mL Per Tube QID  . insulin aspart  0-9 Units Subcutaneous Q4H  . insulin glargine  10 Units Subcutaneous BID  . ipratropium  0.5 mg Nebulization BID  . levothyroxine  88 mcg Per Tube QAC breakfast  . metoCLOPramide  5 mg Per Tube TID  . Muscle Rub   Topical TID  . pantoprazole sodium  40 mg Per Tube BID  . QUEtiapine  50 mg Oral QHS  . saccharomyces boulardii  250 mg Oral BID  . thiamine  100 mg Per Tube Daily   Continuous Infusions:    . sodium chloride 20 mL/hr at 10/26/12 2224  . feeding supplement (OSMOLITE 1.5 CAL) 1,000 mL (10/28/12 1957)   PRN Meds:.acetaminophen, acetaminophen, albuterol, bisacodyl, fentaNYL, LORazepam, methocarbamol, ondansetron (ZOFRAN) IV, polyethylene glycol, sodium chloride, sodium chloride, sorbitol, traZODone  Labs:  CMP     Component Value Date/Time   NA 136 10/30/2012 0555   K 3.6 10/30/2012 0555   CL 99 10/30/2012 0555   CO2 31 10/30/2012 0555   GLUCOSE  145* 10/30/2012 0555   BUN 17 10/30/2012 0555   CREATININE 0.61 10/30/2012 0555   CALCIUM 8.0* 10/30/2012 0555   PROT 6.0 10/23/2012 0500   ALBUMIN 1.9* 10/23/2012 0500   AST 32 10/23/2012 0500   ALT 25 10/23/2012 0500   ALKPHOS 132* 10/23/2012 0500   BILITOT 0.5 10/23/2012 0500   GFRNONAA >90 10/30/2012 0555   GFRAA >90 10/30/2012 0555   CBG (last 3)   Basename 10/31/12 0816 10/31/12 0354 10/30/12 2355  GLUCAP 114* 137* 158*   HGB  Date/Time Value Range Status  04/08/2012  2:55 PM 12.5* 13.0 - 17.1 g/dL Final     Hemoglobin  Date/Time Value Range Status  10/31/2012  5:00 AM 7.9* 13.0 - 17.0 g/dL Final  14/78/2956  2:13 AM 8.4* 13.0 - 17.0 g/dL Final  08/65/7846  9:62 AM 8.3* 13.0 - 17.0 g/dL Final  95/28/4132  4:40 AM 10.0* 13.0 - 17.0 g/dL Final  10/27/2535  6:44 AM 13.0  13.0 - 17.0 g/dL Final     Intake/Output Summary (Last 24 hours) at 10/31/12 1128 Last data filed at 10/31/12 0830  Gross per 24 hour  Intake    100 ml  Output    201 ml  Net   -101  ml  BM 11/1 (loose)  Weight Status:  55 kg/120 lb (10/30) - trending down  Body mass index is 19.04 kg/(m^2). Weight is WNL.  Re-estimated needs:  1900 - 2100 kcal, 85-100 grams protein  Nutrition Dx:  Swallowing difficulty r/t cervical fracture AEB PEG tube required for nutrition. Ongoing.  Goal:  Meet 90-100% estimated nutrition needs - met  Monitor:  TF tolerance, weight trend, labs  Jarold Motto MS, RD, LDN Pager: [phone] After-hours pager: [phone]
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
## 2012-11-01 DIAGNOSIS — R195 Other fecal abnormalities: Secondary | ICD-10-CM

## 2012-11-01 DIAGNOSIS — D649 Anemia, unspecified: Secondary | ICD-10-CM

## 2012-11-01 DIAGNOSIS — I4892 Unspecified atrial flutter: Secondary | ICD-10-CM | POA: Diagnosis not present

## 2012-11-01 DIAGNOSIS — C228 Malignant neoplasm of liver, primary, unspecified as to type: Secondary | ICD-10-CM

## 2012-11-01 LAB — CBC
MCHC: 33.5 g/dL (ref 30.0–36.0)
Platelets: 132 10*3/uL — ABNORMAL LOW (ref 150–400)
RDW: 14.9 % (ref 11.5–15.5)
WBC: 7.5 10*3/uL (ref 4.0–10.5)

## 2012-11-01 LAB — GLUCOSE, CAPILLARY
Glucose-Capillary: 119 mg/dL — ABNORMAL HIGH (ref 70–99)
Glucose-Capillary: 142 mg/dL — ABNORMAL HIGH (ref 70–99)
Glucose-Capillary: 178 mg/dL — ABNORMAL HIGH (ref 70–99)
Glucose-Capillary: 187 mg/dL — ABNORMAL HIGH (ref 70–99)

## 2012-11-01 LAB — BASIC METABOLIC PANEL
Chloride: 93 mEq/L — ABNORMAL LOW (ref 96–112)
GFR calc Af Amer: >90 mL/min (ref >90)
GFR calc non Af Amer: >90 mL/min (ref >90)
Potassium: 3.9 mEq/L (ref 3.5–5.1)
Sodium: 129 mEq/L — ABNORMAL LOW (ref 135–145)

## 2012-11-01 LAB — MAGNESIUM: Magnesium: 1.7 mg/dL (ref 1.5–2.5)

## 2012-11-01 MED ORDER — DILTIAZEM HCL ER COATED BEADS 120 MG PO CP24
120.0000 mg | ORAL_CAPSULE | Freq: Every day | ORAL | Status: DC
  Administered 2012-11-01 – 2012-11-02 (×2): 120 mg via ORAL
  Filled 2012-11-01 (×3): qty 1
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
## 2012-11-02 ENCOUNTER — Inpatient Hospital Stay (HOSPITAL_COMMUNITY): Payer: PRIVATE HEALTH INSURANCE

## 2012-11-02 LAB — CBC
Hemoglobin: 8.2 g/dL — ABNORMAL LOW (ref 13.0–17.0)
MCHC: 32.9 g/dL (ref 30.0–36.0)
RBC: 2.77 MIL/uL — ABNORMAL LOW (ref 4.22–5.81)
WBC: 7 10*3/uL (ref 4.0–10.5)

## 2012-11-02 LAB — GLUCOSE, CAPILLARY
Glucose-Capillary: 152 mg/dL — ABNORMAL HIGH (ref 70–99)
Glucose-Capillary: 181 mg/dL — ABNORMAL HIGH (ref 70–99)
Glucose-Capillary: 197 mg/dL — ABNORMAL HIGH (ref 70–99)
Glucose-Capillary: 213 mg/dL — ABNORMAL HIGH (ref 70–99)

## 2012-11-02 NOTE — Progress Notes (Signed)
1845 Pt. Non-compliant keeping HOB at 30 degrees or greater.  Pt. Lowers head of bed independent of nurse. RN provided education regarding importance of maintaining bed head height at least 30 degrees.

## 2012-11-02 NOTE — Progress Notes (Signed)
Still in flutter, now with slow VR. Will stop Diltiazem. He is for colonoscopy in am, will follow up afterwards.  Corine Shelter PA-C 11/02/2012 9:32 AM

## 2012-11-02 NOTE — Progress Notes (Signed)
0930 Routine EKG reveals pt. Remains in A.Flutter-A.Fib. 1000 Dr. Riley Kill notified and updated of results.

## 2012-11-02 NOTE — Progress Notes (Signed)
Restless most of night. Productive, congested cough. C/O cramps to BLE's  And to left toes. PRN robaxin and trazodone given at 2230. Continued to be restless, PRN ativan given, but not effective. Reports feeling anxious about paying bills when he goes home. Spilled urinal on gown and bed, linen and gown changed by staff. [name] A
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
## 2012-11-02 NOTE — H&P (Signed)
1255 Denise, RN notified for Rapid Response for changes in vitals and mentation (lucidness).  SEE Doc Flowsheet for Vital sign details.  Afebrile, HR low, 02 saturation < 90% on RA.  3L 02 applied and O2 sats remain above 100% EKG obtained; reveals no significant changes.  Pt. Remains in afib-aflutter.  Pt. Appears to have periods of apnea and shallow breathing while sleeping. Continuing to monitor.  1340 RN notified Dr. Riley Kill notified of changes.  Verbal order to maintain 3L O2.

## 2012-11-02 NOTE — Progress Notes (Signed)
Called by primary Rn to evaluate patient for desaturation and lethargy.  Upon arrival to patients room, Rn at bedside.  Patient in bed on nasal cannula at 3lpm.  Patient denies any pain or SOB, states he feels terrible.  HR jumping from low 30's to 110's on dinamap, radial pulse felt and dinamap not correlating with actual hr at times.  EKG no changes.  Patient is having brief episodes of apnea and shallow breathing during sleep which causes a drop in HR and then bounces right back up along with occassional drop in oxygen sats.  Recommend to continue to monitor patient.  Rn to call if assistance needed
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
## 2012-11-03 ENCOUNTER — Encounter (HOSPITAL_COMMUNITY): Payer: Self-pay | Admitting: Gastroenterology

## 2012-11-03 ENCOUNTER — Inpatient Hospital Stay (HOSPITAL_COMMUNITY)
Admission: AD | Admit: 2012-11-03 | Discharge: 2012-11-09 | DRG: 177 | Disposition: A | Discharge status: Other Acute Inpatient Hospital | Payer: PRIVATE HEALTH INSURANCE | Source: Ambulatory Visit | Attending: Internal Medicine | Admitting: Internal Medicine

## 2012-11-03 DIAGNOSIS — J95821 Acute postprocedural respiratory failure: Secondary | ICD-10-CM

## 2012-11-03 DIAGNOSIS — Z681 Body mass index (BMI) 19 or less, adult: Secondary | ICD-10-CM

## 2012-11-03 DIAGNOSIS — S14129A Central cord syndrome at unspecified level of cervical spinal cord, initial encounter: Secondary | ICD-10-CM

## 2012-11-03 DIAGNOSIS — E119 Type 2 diabetes mellitus without complications: Secondary | ICD-10-CM | POA: Diagnosis present

## 2012-11-03 DIAGNOSIS — Z8505 Personal history of malignant neoplasm of liver: Secondary | ICD-10-CM

## 2012-11-03 DIAGNOSIS — S129XXA Fracture of neck, unspecified, initial encounter: Secondary | ICD-10-CM

## 2012-11-03 DIAGNOSIS — J690 Pneumonitis due to inhalation of food and vomit: Secondary | ICD-10-CM

## 2012-11-03 DIAGNOSIS — Z794 Long term (current) use of insulin: Secondary | ICD-10-CM

## 2012-11-03 DIAGNOSIS — L89109 Pressure ulcer of unspecified part of back, unspecified stage: Secondary | ICD-10-CM | POA: Diagnosis present

## 2012-11-03 DIAGNOSIS — L8995 Pressure ulcer of unspecified site, unstageable: Secondary | ICD-10-CM | POA: Diagnosis present

## 2012-11-03 DIAGNOSIS — I739 Peripheral vascular disease, unspecified: Secondary | ICD-10-CM | POA: Diagnosis present

## 2012-11-03 DIAGNOSIS — Z981 Arthrodesis status: Secondary | ICD-10-CM

## 2012-11-03 DIAGNOSIS — D649 Anemia, unspecified: Secondary | ICD-10-CM

## 2012-11-03 DIAGNOSIS — C220 Liver cell carcinoma: Secondary | ICD-10-CM | POA: Diagnosis present

## 2012-11-03 DIAGNOSIS — J4489 Other specified chronic obstructive pulmonary disease: Secondary | ICD-10-CM | POA: Diagnosis present

## 2012-11-03 DIAGNOSIS — N390 Urinary tract infection, site not specified: Secondary | ICD-10-CM | POA: Diagnosis present

## 2012-11-03 DIAGNOSIS — Y95 Nosocomial condition: Secondary | ICD-10-CM | POA: Diagnosis present

## 2012-11-03 DIAGNOSIS — K922 Gastrointestinal hemorrhage, unspecified: Secondary | ICD-10-CM

## 2012-11-03 DIAGNOSIS — J189 Pneumonia, unspecified organism: Secondary | ICD-10-CM

## 2012-11-03 DIAGNOSIS — R1312 Dysphagia, oropharyngeal phase: Secondary | ICD-10-CM | POA: Diagnosis present

## 2012-11-03 DIAGNOSIS — R509 Fever, unspecified: Secondary | ICD-10-CM | POA: Diagnosis present

## 2012-11-03 DIAGNOSIS — F1011 Alcohol abuse, in remission: Secondary | ICD-10-CM | POA: Diagnosis present

## 2012-11-03 DIAGNOSIS — R1310 Dysphagia, unspecified: Secondary | ICD-10-CM | POA: Diagnosis present

## 2012-11-03 DIAGNOSIS — D500 Iron deficiency anemia secondary to blood loss (chronic): Secondary | ICD-10-CM | POA: Diagnosis present

## 2012-11-03 DIAGNOSIS — E43 Unspecified severe protein-calorie malnutrition: Secondary | ICD-10-CM | POA: Diagnosis present

## 2012-11-03 DIAGNOSIS — J449 Chronic obstructive pulmonary disease, unspecified: Secondary | ICD-10-CM | POA: Diagnosis present

## 2012-11-03 DIAGNOSIS — I498 Other specified cardiac arrhythmias: Secondary | ICD-10-CM | POA: Diagnosis present

## 2012-11-03 DIAGNOSIS — IMO0002 Reserved for concepts with insufficient information to code with codable children: Secondary | ICD-10-CM

## 2012-11-03 DIAGNOSIS — D691 Qualitative platelet defects: Secondary | ICD-10-CM | POA: Diagnosis present

## 2012-11-03 DIAGNOSIS — I4892 Unspecified atrial flutter: Secondary | ICD-10-CM

## 2012-11-03 DIAGNOSIS — D696 Thrombocytopenia, unspecified: Secondary | ICD-10-CM | POA: Diagnosis present

## 2012-11-03 DIAGNOSIS — I10 Essential (primary) hypertension: Secondary | ICD-10-CM | POA: Diagnosis present

## 2012-11-03 DIAGNOSIS — B9689 Other specified bacterial agents as the cause of diseases classified elsewhere: Secondary | ICD-10-CM | POA: Diagnosis present

## 2012-11-03 DIAGNOSIS — R5381 Other malaise: Secondary | ICD-10-CM | POA: Diagnosis present

## 2012-11-03 DIAGNOSIS — Y92009 Unspecified place in unspecified non-institutional (private) residence as the place of occurrence of the external cause: Secondary | ICD-10-CM

## 2012-11-03 DIAGNOSIS — E039 Hypothyroidism, unspecified: Secondary | ICD-10-CM | POA: Diagnosis present

## 2012-11-03 DIAGNOSIS — Z7982 Long term (current) use of aspirin: Secondary | ICD-10-CM

## 2012-11-03 DIAGNOSIS — Y833 Surgical operation with formation of external stoma as the cause of abnormal reaction of the patient, or of later complication, without mention of misadventure at the time of the procedure: Secondary | ICD-10-CM | POA: Diagnosis present

## 2012-11-03 DIAGNOSIS — S069X9A Unspecified intracranial injury with loss of consciousness of unspecified duration, initial encounter: Secondary | ICD-10-CM

## 2012-11-03 DIAGNOSIS — Z79899 Other long term (current) drug therapy: Secondary | ICD-10-CM

## 2012-11-03 DIAGNOSIS — Z87891 Personal history of nicotine dependence: Secondary | ICD-10-CM

## 2012-11-03 DIAGNOSIS — K9429 Other complications of gastrostomy: Secondary | ICD-10-CM | POA: Diagnosis present

## 2012-11-03 HISTORY — DX: Pneumonia, unspecified organism: J18.9

## 2012-11-03 HISTORY — DX: Peripheral vascular disease, unspecified: I73.9

## 2012-11-03 LAB — LIPID PANEL
HDL: 16 mg/dL — ABNORMAL LOW (ref >39)
LDL Cholesterol: 40 mg/dL (ref 0–99)
Total CHOL/HDL Ratio: 4.5 RATIO
VLDL: 16 mg/dL (ref 0–40)

## 2012-11-03 LAB — GLUCOSE, CAPILLARY
Glucose-Capillary: 137 mg/dL — ABNORMAL HIGH (ref 70–99)
Glucose-Capillary: 185 mg/dL — ABNORMAL HIGH (ref 70–99)
Glucose-Capillary: 151 mg/dL — ABNORMAL HIGH (ref 70–99)

## 2012-11-03 LAB — BASIC METABOLIC PANEL
BUN: 18 mg/dL (ref 6–23)
CO2: 33 mEq/L — ABNORMAL HIGH (ref 19–32)
Chloride: 93 mEq/L — ABNORMAL LOW (ref 96–112)
Glucose, Bld: 172 mg/dL — ABNORMAL HIGH (ref 70–99)
Potassium: 4.2 mEq/L (ref 3.5–5.1)
Sodium: 132 mEq/L — ABNORMAL LOW (ref 135–145)

## 2012-11-03 LAB — CBC
HCT: 27.4 % — ABNORMAL LOW (ref 39.0–52.0)
Hemoglobin: 9 g/dL — ABNORMAL LOW (ref 13.0–17.0)
RBC: 3.02 MIL/uL — ABNORMAL LOW (ref 4.22–5.81)

## 2012-11-03 MED ORDER — ACETAMINOPHEN 160 MG/5ML PO SOLN
325.0000 mg | Freq: Four times a day (QID) | ORAL | Status: DC | PRN
  Administered 2012-11-05 – 2012-11-08 (×4): 325 mg via ORAL
  Filled 2012-11-03 (×5): qty 10.2

## 2012-11-03 MED ORDER — LORAZEPAM 0.5 MG PO TABS
0.5000 mg | ORAL_TABLET | Freq: Four times a day (QID) | ORAL | Status: DC | PRN
  Administered 2012-11-04: 0.5 mg via ORAL
  Filled 2012-11-03: qty 1

## 2012-11-03 MED ORDER — INSULIN ASPART 100 UNIT/ML ~~LOC~~ SOLN
0.0000 [IU] | Freq: Three times a day (TID) | SUBCUTANEOUS | Status: DC
  Administered 2012-11-03 – 2012-11-06 (×4): 2 [IU] via SUBCUTANEOUS
  Administered 2012-11-06: 5 [IU] via SUBCUTANEOUS
  Administered 2012-11-07 – 2012-11-09 (×3): 2 [IU] via SUBCUTANEOUS
  Administered 2012-11-09: 3 [IU] via SUBCUTANEOUS

## 2012-11-03 MED ORDER — ALBUTEROL SULFATE (5 MG/ML) 0.5% IN NEBU
2.5000 mg | INHALATION_SOLUTION | Freq: Four times a day (QID) | RESPIRATORY_TRACT | Status: DC
  Administered 2012-11-03 – 2012-11-09 (×22): 2.5 mg via RESPIRATORY_TRACT
  Filled 2012-11-03 (×27): qty 0.5

## 2012-11-03 MED ORDER — LORAZEPAM 2 MG/ML IJ SOLN
0.5000 mg | Freq: Four times a day (QID) | INTRAMUSCULAR | Status: DC | PRN
  Administered 2012-11-07: 0.5 mg via INTRAMUSCULAR
  Filled 2012-11-03: qty 1

## 2012-11-03 MED ORDER — PANTOPRAZOLE SODIUM 40 MG PO PACK
40.0000 mg | PACK | Freq: Two times a day (BID) | ORAL | Status: DC
  Administered 2012-11-03 – 2012-11-04 (×4): 40 mg
  Filled 2012-11-03 (×9): qty 20

## 2012-11-03 MED ORDER — INSULIN GLARGINE 100 UNIT/ML ~~LOC~~ SOLN
5.0000 [IU] | Freq: Every day | SUBCUTANEOUS | Status: DC
  Administered 2012-11-04 (×2): 5 [IU] via SUBCUTANEOUS

## 2012-11-03 MED ORDER — BIOTENE DRY MOUTH MT LIQD
15.0000 mL | OROMUCOSAL | Status: DC | PRN
Start: 2012-11-03 — End: 2012-11-09

## 2012-11-03 MED ORDER — BUDESONIDE 0.5 MG/2ML IN SUSP
0.5000 mg | Freq: Two times a day (BID) | RESPIRATORY_TRACT | Status: DC
  Administered 2012-11-04 – 2012-11-09 (×9): 0.5 mg via RESPIRATORY_TRACT
  Filled 2012-11-03 (×14): qty 2

## 2012-11-03 MED ORDER — FREE WATER
200.0000 mL | Freq: Four times a day (QID) | Status: DC
  Administered 2012-11-03 – 2012-11-04 (×5): 200 mL
  Administered 2012-11-04: 100 mL
  Administered 2012-11-05 – 2012-11-09 (×15): 200 mL

## 2012-11-03 MED ORDER — QUETIAPINE FUMARATE 50 MG PO TABS
50.0000 mg | ORAL_TABLET | Freq: Every day | ORAL | Status: DC
  Administered 2012-11-04 (×2): 50 mg via ORAL
  Filled 2012-11-03 (×4): qty 1

## 2012-11-03 MED ORDER — DOCUSATE SODIUM 50 MG/5ML PO LIQD
100.0000 mg | Freq: Two times a day (BID) | ORAL | Status: DC
  Administered 2012-11-03 – 2012-11-04 (×3): 100 mg
  Filled 2012-11-03 (×5): qty 10

## 2012-11-03 MED ORDER — DOCUSATE SODIUM 100 MG PO CAPS
100.0000 mg | ORAL_CAPSULE | Freq: Two times a day (BID) | ORAL | Status: DC
  Filled 2012-11-03 (×2): qty 1

## 2012-11-03 MED ORDER — OSMOLITE 1.5 CAL PO LIQD
1000.0000 mL | ORAL | Status: DC
  Administered 2012-11-04 – 2012-11-09 (×2): 1000 mL
  Filled 2012-11-03 (×13): qty 1000

## 2012-11-03 MED ORDER — MAGNESIUM OXIDE 400 (241.3 MG) MG PO TABS
400.0000 mg | ORAL_TABLET | Freq: Two times a day (BID) | ORAL | Status: DC
  Administered 2012-11-03 – 2012-11-04 (×4): 400 mg via ORAL
  Filled 2012-11-03 (×7): qty 1

## 2012-11-03 MED ORDER — METOCLOPRAMIDE HCL 5 MG PO TABS
5.0000 mg | ORAL_TABLET | Freq: Three times a day (TID) | ORAL | Status: DC
  Administered 2012-11-03 – 2012-11-04 (×5): 5 mg via ORAL
  Filled 2012-11-03 (×10): qty 1

## 2012-11-03 MED ORDER — VITAMIN B-1 100 MG PO TABS
100.0000 mg | ORAL_TABLET | Freq: Every day | ORAL | Status: DC
  Administered 2012-11-03 – 2012-11-04 (×2): 100 mg via ORAL
  Filled 2012-11-03 (×4): qty 1

## 2012-11-03 MED ORDER — ASPIRIN 81 MG PO CHEW
81.0000 mg | CHEWABLE_TABLET | Freq: Every day | ORAL | Status: DC
  Administered 2012-11-03 – 2012-11-09 (×4): 81 mg via ORAL
  Filled 2012-11-03 (×4): qty 1

## 2012-11-03 MED ORDER — IPRATROPIUM BROMIDE 0.02 % IN SOLN
0.5000 mg | Freq: Four times a day (QID) | RESPIRATORY_TRACT | Status: DC
  Administered 2012-11-03 – 2012-11-09 (×22): 0.5 mg via RESPIRATORY_TRACT
  Filled 2012-11-03 (×27): qty 2.5

## 2012-11-03 MED ORDER — LEVOTHYROXINE SODIUM 88 MCG PO TABS
88.0000 ug | ORAL_TABLET | Freq: Every day | ORAL | Status: DC
  Filled 2012-11-03: qty 1

## 2012-11-03 MED ORDER — PRO-STAT SUGAR FREE PO LIQD
30.0000 mL | Freq: Three times a day (TID) | ORAL | Status: DC
  Administered 2012-11-03 – 2012-11-04 (×2): 30 mL via ORAL
  Filled 2012-11-03 (×6): qty 30

## 2012-11-03 MED ORDER — LEVOTHYROXINE SODIUM 88 MCG PO TABS
88.0000 ug | ORAL_TABLET | Freq: Every day | ORAL | Status: DC
  Administered 2012-11-03 – 2012-11-04 (×2): 88 ug via ORAL
  Filled 2012-11-03 (×4): qty 1

## 2012-11-03 MED ORDER — ONDANSETRON HCL 4 MG/2ML IJ SOLN
4.0000 mg | Freq: Four times a day (QID) | INTRAMUSCULAR | Status: DC | PRN
  Administered 2012-11-06: 4 mg via INTRAVENOUS
  Filled 2012-11-03: qty 2

## 2012-11-03 MED ORDER — CYCLOBENZAPRINE HCL 5 MG PO TABS
5.0000 mg | ORAL_TABLET | Freq: Every day | ORAL | Status: DC
  Administered 2012-11-04 (×2): 5 mg via ORAL
  Filled 2012-11-03 (×3): qty 1

## 2012-11-03 NOTE — Progress Notes (Signed)
Complains of tenderness at peg site. Peg site red with green drainage. Zero residuals from peg. [name] A

## 2012-11-03 NOTE — Discharge Summary (Signed)
  Discharge summary job [phone]
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
## 2012-11-03 NOTE — Progress Notes (Signed)
Subjective: Feeling well.  Some SOB.  Objective: Vital signs in last 24 hours: Temp:  [97.5 F (36.4 C)-100.2 F (37.9 C)] 97.5 F (36.4 C) (11/04 0428) Pulse Rate:  [40-70] 70  (11/04 0428) Resp:  [18] 18  (11/04 0428) BP: (98-126)/(28-86) 112/55 mmHg (11/04 0428) SpO2:  [94 %-100 %] 96 % (11/04 0428) Weight:  [58.2 kg (128 lb 4.9 oz)] 58.2 kg (128 lb 4.9 oz) (11/04 0428) Last BM Date: 10/30/12  Intake/Output from previous day: 11/03 0701 - 11/04 0700 In: 600 [NG/GT:600] Out: 1500 [Urine:1500] Intake/Output this shift:    General appearance: alert and no distress Resp: decreased breath sounds at the bases GI: soft, non-tender; bowel sounds normal; no masses,  no organomegaly  Lab Results:  [facility] 11/02/12 0841 11/01/12 0940  WBC 7.0 7.5  HGB 8.2* 8.2*  HCT 24.9* 24.5*  PLT 136* 132*   BMET  Basename 11/01/12 0940  NA 129*  K 3.9  CL 93*  CO2 30  GLUCOSE 244*  BUN 16  CREATININE 0.64  CALCIUM 7.9*   LFT No results found for this basename: PROT,ALBUMIN,AST,ALT,ALKPHOS,BILITOT,BILIDIR,IBILI in the last 72 hours PT/INR No results found for this basename: LABPROT:2,INR:2 in the last 72 hours Hepatitis Panel No results found for this basename: HEPBSAG,HCVAB,HEPAIGM,HEPBIGM in the last 72 hours C-Diff No results found for this basename: CDIFFTOX:3 in the last 72 hours Fecal Lactopherrin No results found for this basename: FECLLACTOFRN in the last 72 hours  Studies/Results: Dg Chest 2 View  11/02/2012  *RADIOLOGY REPORT*  Clinical Data: Cough. Pneumonia.  CHEST - 2 VIEW  Comparison: Chest x-ray 10/27/2012.  Findings: Lung volumes are normal.  Extensive patchy interstitial and airspace disease is noted throughout the right mid and lower lung.  To a lesser extent, similar findings are also noted in the contralateral lung.  Trace bilateral pleural effusions.  Pulmonary vasculature is normal.  Heart size is borderline enlarged. Mediastinal contours are unremarkable.   Atherosclerosis in the thoracic aorta.  Previously noted right upper extremity PICC has been removed. Multiple surgical clips project over the right upper quadrant of the abdomen.  IMPRESSION: 1.  Findings remain concerning for multilobar pneumonia, predominantly in the right lower lobe, but with some evidence of early endobronchial spread to the left lower lobe as well.   Original Report Authenticated By: Trudie Reed, M.[name].     Medications:  Scheduled:   . albuterol  2.5 mg Nebulization BID  . antiseptic oral rinse  15 mL Mouth Rinse QID  . aspirin  81 mg Per Tube Daily  . budesonide  0.5 mg Nebulization BID  . collagenase   Topical Daily  . feeding supplement  30 mL Per Tube Daily  . free water  200 mL Per Tube QID  . insulin aspart  0-9 Units Subcutaneous Q4H  . insulin glargine  10 Units Subcutaneous BID  . ipratropium  0.5 mg Nebulization BID  . levothyroxine  88 mcg Per Tube QAC breakfast  . magnesium oxide  400 mg Oral BID  . metoCLOPramide  5 mg Per Tube TID  . Muscle Rub   Topical TID  . pantoprazole sodium  40 mg Per Tube BID  . QUEtiapine  50 mg Oral QHS  . saccharomyces boulardii  250 mg Oral BID  . thiamine  100 mg Per Tube Daily  . [DISCONTINUED] diltiazem  120 mg Oral Daily   Continuous:   . sodium chloride 20 mL/hr at 10/26/12 2224  . sodium chloride 500 mL (10/31/12 1203)  .  feeding supplement (OSMOLITE 1.5 CAL) 1,000 mL (11/03/12 0238)    Assessment/Plan: 1) Anemia. 2) Heme positive stool. 3) ? Multilobar pneumonia.   He appears to be stable, but he does exhibit a low grade temp.  No antibiotics ordered at this time.  Cardiology has cleared him for the colonoscopy.  Plan: 1) Await Rx of his pneumonia. 2) Possible colonoscopy on Wednesday if stable.  LOS: 31 days   [name] 11/03/2012, 8:28 AM

## 2012-11-03 NOTE — Progress Notes (Signed)
At 2315 patient resting quietly. Temp 100.2 (oral), O2 sat 99% with O2 at 3L/M via [place], HR (irregular) with rate varying-52, BP=126/43, resp. 18. PRN tylenol given for generalized discomfort. Continues with congested, productive cough. Using [place] to manage secretions. Lung sounds diminished throughout. Demonstrated taking deep breaths and coughing. Patient able to perform with cues. At 0150 called to desk to report "not feeling well". BP=108/35 (manual check), HR=46 (irregular), resp.16, O2 sat 100 with O2 at 3L/M via [place], temp.97.5 (oral). Paged Wes with Rapid Response to assess patient further. At 0330 B/P=98/38 (manual check), HR 53 (irregular), O2 sat 98% with O2 at 3 L/M. LBM 10/30/12- patient refused laxatives. Spoke with patient at length about importance of taking meds to regulate bowels, "maybe tomorrow". Using urinal at edge of bed and spills at times. Independent with bed mobility. [name]
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
## 2012-11-03 NOTE — Progress Notes (Signed)
Social Work  Discharge Note  The overall goal for the admission was met for:   Discharge location: No - transfer back to acute due to medical issues  Length of Stay: No  Discharge activity level: Yes - supervision overall  Home/community participation: Yes  Services provided included: MD, RD, PT, OT, SLP, RN, TR, Pharmacy, Neuropsych and SW  Financial Services: Medicaid and Private Insurance: [employer] (PRIMARY)  Follow-up services arranged: Home Health: RN, PT, OT, ST already referred to [facility], DME: 16x16 Breezy w/c, cushion, 3n1 commode, tub bench and tube feedings already ordered via [facility] and Patient/Family has no preference for HH/DME agencies  Comments (or additional information):  Patient/Family verbalized understanding of follow-up arrangements: Yes  Individual responsible for coordination of the follow-up plan: patient  Confirmed correct DME delivered: [name] 11/03/2012    [name]
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
## 2012-11-04 ENCOUNTER — Inpatient Hospital Stay (HOSPITAL_COMMUNITY): Payer: PRIVATE HEALTH INSURANCE

## 2012-11-04 DIAGNOSIS — R1310 Dysphagia, unspecified: Secondary | ICD-10-CM

## 2012-11-04 DIAGNOSIS — R509 Fever, unspecified: Secondary | ICD-10-CM

## 2012-11-04 LAB — GLUCOSE, CAPILLARY
Glucose-Capillary: 122 mg/dL — ABNORMAL HIGH (ref 70–99)
Glucose-Capillary: 162 mg/dL — ABNORMAL HIGH (ref 70–99)

## 2012-11-04 LAB — CBC
Platelets: 165 10*3/uL (ref 150–400)
RDW: 15 % (ref 11.5–15.5)
WBC: 6.5 10*3/uL (ref 4.0–10.5)

## 2012-11-04 LAB — COMPREHENSIVE METABOLIC PANEL
AST: 42 U/L — ABNORMAL HIGH (ref 0–37)
Albumin: 1.7 g/dL — ABNORMAL LOW (ref 3.5–5.2)
Alkaline Phosphatase: 134 U/L — ABNORMAL HIGH (ref 39–117)
Chloride: 93 mEq/L — ABNORMAL LOW (ref 96–112)
Creatinine, Ser: 0.67 mg/dL (ref 0.50–1.35)
Potassium: 3.8 mEq/L (ref 3.5–5.1)
Total Bilirubin: 0.7 mg/dL (ref 0.3–1.2)

## 2012-11-04 LAB — FUNGUS CULTURE W SMEAR: Fungal Smear: NONE SEEN

## 2012-11-04 LAB — URINALYSIS, MICROSCOPIC ONLY
Bilirubin Urine: NEGATIVE
Ketones, ur: NEGATIVE mg/dL
Nitrite: NEGATIVE
Urobilinogen, UA: 1 mg/dL (ref 0.0–1.0)
pH: 7.5 (ref 5.0–8.0)

## 2012-11-04 LAB — HEMOGLOBIN A1C
Hgb A1c MFr Bld: 6.4 % — ABNORMAL HIGH (ref <5.7)
Mean Plasma Glucose: 137 mg/dL — ABNORMAL HIGH (ref <117)

## 2012-11-04 MED ORDER — COLLAGENASE 250 UNIT/GM EX OINT
TOPICAL_OINTMENT | Freq: Every day | CUTANEOUS | Status: DC
  Administered 2012-11-05 – 2012-11-09 (×5): via TOPICAL
  Filled 2012-11-04: qty 30

## 2012-11-04 MED ORDER — PRO-STAT SUGAR FREE PO LIQD
30.0000 mL | Freq: Every day | ORAL | Status: DC
  Administered 2012-11-06 – 2012-11-09 (×3): 30 mL
  Filled 2012-11-04 (×5): qty 30

## 2012-11-04 MED ORDER — PIPERACILLIN-TAZOBACTAM 3.375 G IVPB 30 MIN
3.3750 g | Freq: Three times a day (TID) | INTRAVENOUS | Status: DC
  Administered 2012-11-04 – 2012-11-09 (×15): 3.375 g via INTRAVENOUS
  Filled 2012-11-04 (×19): qty 50

## 2012-11-04 MED ORDER — BIOTENE DRY MOUTH MT LIQD
15.0000 mL | Freq: Two times a day (BID) | OROMUCOSAL | Status: DC
  Administered 2012-11-04 – 2012-11-09 (×8): 15 mL via OROMUCOSAL

## 2012-11-04 MED ORDER — IOHEXOL 300 MG/ML  SOLN
20.0000 mL | INTRAMUSCULAR | Status: AC
  Administered 2012-11-04 (×2): 20 mL via ORAL

## 2012-11-04 NOTE — Progress Notes (Signed)
Subjective: No complaints, but he was transferred from Rehab to the acute floor.  Objective: Vital signs in last 24 hours: Temp:  [98.3 F (36.8 C)-101.1 F (38.4 C)] 98.5 F (36.9 C) (11/05 0507) Pulse Rate:  [56-112] 75  (11/05 0624) Resp:  [14-19] 14  (11/05 0507) BP: (93-134)/(39-51) 93/44 mmHg (11/05 0624) SpO2:  [90 %-100 %] 97 % (11/05 0942) Weight:  [57 kg (125 lb 10.6 oz)] 57 kg (125 lb 10.6 oz) (11/05 0507) Last BM Date:  (pt does not remember)  Intake/Output from previous day: 11/04 0701 - 11/05 0700 In: -  Out: 350 [Urine:350] Intake/Output this shift: Total I/O In: 0  Out: 100 [Urine:100]  General appearance: Arousable, but becomes somnolent easily. GI: soft, non-tender; bowel sounds normal; no masses,  no organomegaly  Lab Results:  Basename 11/04/12 0500 11/03/12 1010 11/02/12 0841  WBC 6.5 8.4 7.0  HGB 7.4* 9.0* 8.2*  HCT 22.9* 27.4* 24.9*  PLT 165 165 136*   BMET  Basename 11/04/12 0500 11/03/12 1010  NA 132* 132*  K 3.8 4.2  CL 93* 93*  CO2 36* 33*  GLUCOSE 91 172*  BUN 21 18  CREATININE 0.67 0.62  CALCIUM 8.1* 8.3*   LFT  Basename 11/04/12 0500  PROT 5.7*  ALBUMIN 1.7*  AST 42*  ALT 27  ALKPHOS 134*  BILITOT 0.7  BILIDIR --  IBILI --   PT/INR  Basename 11/03/12 1438  LABPROT 14.5  INR 1.15   Hepatitis Panel No results found for this basename: HEPBSAG,HCVAB,HEPAIGM,HEPBIGM in the last 72 hours C-Diff No results found for this basename: CDIFFTOX:3 in the last 72 hours Fecal Lactopherrin No results found for this basename: FECLLACTOFRN in the last 72 hours  Studies/Results: No results found.  Medications:  Scheduled:   . albuterol  2.5 mg Nebulization Q6H  . antiseptic oral rinse  15 mL Mouth Rinse BID  . aspirin  81 mg Oral Daily  . budesonide  0.5 mg Nebulization BID  . collagenase   Topical Daily  . cyclobenzaprine  5 mg Oral QHS  . docusate  100 mg Per Tube BID  . feeding supplement  30 mL Per Tube Daily  .  free water  200 mL Per Tube Q6H  . insulin aspart  0-9 Units Subcutaneous TID WC  . insulin glargine  5 Units Subcutaneous QHS  . ipratropium  0.5 mg Nebulization Q6H  . levothyroxine  88 mcg Oral QAC breakfast  . magnesium oxide  400 mg Oral BID  . metoCLOPramide  5 mg Oral Q8H  . pantoprazole sodium  40 mg Per Tube BID  . QUEtiapine  50 mg Oral QHS  . thiamine  100 mg Oral Daily  . [DISCONTINUED] docusate sodium  100 mg Oral BID  . [DISCONTINUED] feeding supplement  30 mL Oral TID WC   Continuous:   . feeding supplement (OSMOLITE 1.5 CAL) 1,000 mL (11/04/12 0103)    Assessment/Plan: 1) Anemia and heme positive stool 2) Pneumonia.   The patient becomes somnolent very easily.  Nursing reports that there are periods of apnea.  I cannot perform a colonoscopy at this time.  These acute issues need to be resolved before I can perform the procedure.  Plan: 1) Rx pneumonia. 2) Call with patient is able to have a colonoscopy.  LOS: 1 day   Joycelynn Fritsche D 11/04/2012, 2:27 PM

## 2012-11-04 NOTE — Progress Notes (Signed)
Speech Language Pathology  Patient Details Name: [name] MRN: [id number] DOB: [birth year] Today's Date: 11/04/2012 Time:  -    Spoke with Dr. Arbutus Leas regarding pt.'s dysphagia.  MBS will be performed 11/6 to assess current oropharyngeal ability.  [name] Holden.Ed [employer] [phone]

## 2012-11-04 NOTE — Progress Notes (Addendum)
Pt's PEG tube is leaking every time the patient coughs.  Dressing changed and Dr. Elnoria Howard notified and was told that Jimmye Norman was the one who placed the PEG tube and to notify him.  Jimmye Norman has been paged.

## 2012-11-04 NOTE — Progress Notes (Signed)
Notified Dr. Janee Morn of PEG tube leaking.  No new orders given, will continue to monitor patient.
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
## 2012-11-04 NOTE — Progress Notes (Signed)
Utilization Review Completed.   Johnavon Mcclafferty, RN, BSN Nurse Case Manager  [phone]  

## 2012-11-04 NOTE — Consult Note (Signed)
WOC consult Note  Reason for Consult: eval wound of sacrum. Appears pt with pressure ulcer that developed while in the rehab unit, documented as Stage II, today this ulcer appears unstageble 90% yellow slough. Has Santyl in the room with him, so I will continue this tx for now. He is able to turn side to side with easy therefore I will not add air mattress overlay at this time. Pt verbalized importance of turning side to side also.  Wound type: Unstageable Pressure Ulcer  Pressure Ulcer POA: Yes (POA from rehab)  Measurement: 2.0cm x 0.5cm x 0.2cm, full thickness skin loss  Wound bed: 90% yellow, moist  Drainage (amount, consistency, odor) no dressing in place at the time of my assessment, none noted on bed linens  Periwound: intact  Dressing procedure/placement/frequency: enzymatic debridement ointment daily cover with dry dressing.  Pt also reports that he is not up to the chair currently, if he should progress to up in chair, please order chair pressure redistribution pad.  Re consult if needed, will not follow at this time.  Thanks  Ginger Leeth [employer], CWOCN [phone])
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
## 2012-11-04 NOTE — Progress Notes (Signed)
[facility] PROGRESS NOTE  [name] ZOX:[id number] DOB: [birth year] DOA: 11/03/2012 PCP: Bebe Liter, NP  Assessment/Plan: New fever -Spiked temperature 101.748F last night -Check UA and urine culture -Blood cultures x2 sets -Start empiric antibiotics after cultures -Repeat chest x-ray Pulmonary infiltrates  -Spike fever 101.748F last night -Certainly, the patient may have a degree of microaspiration with which the patient will need good oral care and aspiration precautions with head of bed greater than 30  -Good oral hygiene, continued biotene  -speech/swallow evaluation--> modified barium swallow tomorrow -No episodes of apnea or oxygen desaturations noted by nursing staff Anemia with heme positive stools  -EGD was negative on 10/31/2012  -Gastroenterology, Dr. Elnoria Howard is following  -Colonoscopy is planned for 11/05/2012  -Monitor hemoglobin and transfuse as necessary  -Discussed with Dr. Jeani Hawking today--plan colonoscopy thurs. If clinically stable -D/C hypnotics to decrease somnolence Gtube drainage -discussed with [facility]-->local care for now unless there's skin breakdown -MBS scheduled for tommorow by speech therapy, hopeful he passes and he can take po -CT abdomen rule out abdominal wall fluid collection around the J-tube site in light of new fever Atrial flutter  -Patient is rate controlled at this time  -Continue aspirin 81 mg daily  -He is not a candidate for anticoagulation due to his heme positive stool and anemia  -Patient was previously in sinus, may have contributed to the patient's anemia  COPD  - Continue maintenance oxygen at 3 L  -Continue albuterol and Atrovent nebulizers as well as Pulmicort  -Patient is not strong enough for an MDI at this point  -He has followup with pulmonary, Dr. Delford Field on 12/01/2012 at 3:15 PM  Diabetes mellitus type 2  -Check hemoglobin A1c  -Continue Lantus 5 units at bedtime pending hemoglobin A1c  -NovoLog sliding  scale every 6 hours  -Check lipids  Thrombocytopenia  -This appears to be chronic likely due to bone marrow suppression from his multiple morbidities  -Check coags  Dysphagia  -Continue J-tube feedings Osmolite 1.5 at 60 cc an hour  -Check pre-albumin  -Free water flushes 200 cc every 6 hours  -Continue Reglan every 8 hours  Hypothyroidism  -Continue Synthroid  -TSH 2.659 10/12/2012  Deconditioning/C3-C4 cervical spine fracture  -Maintain patient cervical collar  -PT evaluation     Family Communication:   Pt at beside Disposition Plan:   Home when medically stable     Procedures/Studies: Dg Chest 2 View  11/02/2012  *RADIOLOGY REPORT*  Clinical Data: Cough. Pneumonia.  CHEST - 2 VIEW  Comparison: Chest x-ray 10/27/2012.  Findings: Lung volumes are normal.  Extensive patchy interstitial and airspace disease is noted throughout the right mid and lower lung.  To a lesser extent, similar findings are also noted in the contralateral lung.  Trace bilateral pleural effusions.  Pulmonary vasculature is normal.  Heart size is borderline enlarged. Mediastinal contours are unremarkable.  Atherosclerosis in the thoracic aorta.  Previously noted right upper extremity PICC has been removed. Multiple surgical clips project over the right upper quadrant of the abdomen.  IMPRESSION: 1.  Findings remain concerning for multilobar pneumonia, predominantly in the right lower lobe, but with some evidence of early endobronchial spread to the left lower lobe as well.   Original Report Authenticated By: Trudie Reed, M.D.    Dg Chest 2 View  10/27/2012  *RADIOLOGY REPORT*  Clinical Data: Pneumonia  CHEST - 2 VIEW  Comparison: 10/20/2012  Findings: There is a right arm PICC line with tip in the SVC. Heart size appears  normal.  No pleural effusion or edema.  Interval progression of right lower lobe airspace consolidation.  Review of the visualized osseous structures is unremarkable.  IMPRESSION:  1.   Interval progression of right lower lobe pneumonia.   Original Report Authenticated By: Rosealee Albee, M.D.    Dg Chest 2 View  10/20/2012  *RADIOLOGY REPORT*  Clinical Data: Pneumonia and shortness of breath.  CHEST - 2 VIEW  Comparison: Chest x-ray 10/18/2012.  Findings: Extensive right lower lobe air space consolidation is again noted, compatible with severe bronchopneumonia.  Lungs otherwise appear clear.  Small bilateral pleural effusions.  No evidence of pulmonary edema.  Heart size is normal.  Mediastinal contours are unremarkable.  Atherosclerosis in the thoracic aorta. There is a right upper extremity PICC with tip terminating in the proximal superior vena cava.  IMPRESSION: 1.  Persistent severe right lower lobe bronchopneumonia. 2.  Small bilateral pleural effusions. 3.  Atherosclerosis.   Original Report Authenticated By: Florencia Reasons, M.D.    Dg Chest 2 View  10/12/2012  *RADIOLOGY REPORT*  Clinical Data: Pneumonia.  CHEST - 2 VIEW  Comparison: 10/09/2012  Findings: Lordotic positioning noted. Decreased right lower lobe infiltrate is seen compared to prior study.  Tiny posterior pleural effusions noted.  Heart size is within normal limits.  IMPRESSION: Improving right lower lobe infiltrate.   Original Report Authenticated By: Danae Orleans, M.D.    Dg Chest 2 View  10/07/2012  *RADIOLOGY REPORT*  Clinical Data: Shortness of breath, weakness, cough and leukocytosis.  CHEST - 2 VIEW  Comparison: Chest x-ray 09/28/2012.  Findings: Compared to the prior examination there is worsening multifocal interstitial and airspace opacity throughout the right lower lobe, concerning for pneumonia.  Left lung is clear. Probable trace right pleural effusion. Lungs appear mildly hyperexpanded with some increased retrosternal air space and pruning of the pulmonary vasculature in the periphery, compatible with underlying COPD.  Mediastinal contours are unremarkable. Atherosclerosis in the thoracic aorta.   IMPRESSION: 1.  Interval development of patchy interstitial and airspace opacities throughout the right lower lobe highly concerning for pneumonia. 2.  Probable trace right pleural effusion. 3.  Chronic changes of COPD redemonstrated. 4.  Atherosclerosis.  These results will be called to the ordering clinician or representative by the Radiologist Assistant, and communication documented in the PACS Dashboard.   Original Report Authenticated By: Florencia Reasons, M.D.    Dg Abd 1 View  10/13/2012  *RADIOLOGY REPORT*  Clinical Data: Peg tube reinsertion.  ABDOMEN - 1 VIEW  Comparison: None.  Findings: Contrast has been injected through the peg tube and the injected contrast is all within the stomach and duodenal bulb.  There is faint contrast throughout the nondistended colon.  No small bowel dilatation.  Numerous surgical clips in the right upper quadrant.  IMPRESSION: Peg tube appears in good position.   Original Report Authenticated By: Gwynn Burly, M.D.    Ct Abdomen W Contrast  10/17/2012  *RADIOLOGY REPORT*  Clinical Data: Draining G-tube site.  Evaluate for underlying abscess.  CT ABDOMEN WITH CONTRAST  Technique:  Multidetector CT imaging of the abdomen was performed following the standard protocol during bolus administration of intravenous contrast.  Contrast: 80mL OMNIPAQUE IOHEXOL 300 MG/ML  SOLN  Comparison: CT of the abdomen and pelvis 11/23/2010.  Findings:  Lung Bases: Extensive bronchial wall thickening and peribronchovascular micro- and macronodularity in the right lower lobe, as well as some consolidative air space disease in the medial aspect of the  right lower lobe, concerning for sequela of aspiration.  Similar findings are present to a much lesser extent in the posterior aspect of the left lower lobe.  Abdomen: A percutaneous enterogastric tube is noted.  The retention balloon on the tip of the tube is properly located within the gastric lumen in the proximal body.  No well-defined  rim enhancing abnormal fluid collections are noted along the course of the catheter to suggest the presence of a abscess.  There is a small locule of gas adjacent to the catheter within the anterior abdominal wall, and a trace amount of fluid; this is nonspecific and within normal limits for a recently replaced catheter.  Status post right hemihepatectomy and cholecystectomy.  Within the remaining liver there are no focal cystic or solid hepatic lesions. The appearance of the pancreas and bilateral adrenal glands is unremarkable.  In the renal hila bilaterally and there are small calcifications measuring 4 mm suspicious for nonobstructive calculi.  The previously noted exophytic cysts in the upper pole of the left kidney has decreased in size, currently measuring only 12 mm; this lesion measures approximately 50 HU, suggesting proteinaceous or hemorrhagic contents.  There is an additional 12 mm cyst in the superior aspect of the left kidney.  Chronic parenchymal atrophy and scarring in the lower pole of the left kidney is similar to the prior examination.  There is a new area of subcapsular low attenuation and focal linear low attenuation region in the medial aspect of the spleen, which may represent sequela of recent trauma (i.e., splenic laceration with resolving subcapsular hematoma).  Extensive atherosclerosis of the visualized abdominal vasculature.  Within the visualized abdomen there is no ascites, pneumoperitoneum or pathologic distension of small bowel.  Musculoskeletal: There are no aggressive appearing lytic or blastic lesions noted in the visualized portions of the skeleton.  IMPRESSION:  1.  No evidence of abscess associated with the percutaneous enterogastric tube.  There is a tiny locule of gas and a trace amount of fluid adjacent to the tube within the anterior abdominal wall, which is within normal limits for a recently replaced catheter. 2.  Findings suggestive of aspiration pneumonia in the right  lower lobe and to a lesser extent and posterior aspect of the left lower lobe.  Clinical correlation is recommended. 3.  The appearance of the spleen suggests sequelae of recent trauma with a resolving splenic laceration and subcapsular hematoma. 4.  Status post right hemihepatectomy.  No suspicious appearing hepatic lesion in the left hepatic lobe. 5.  Interval decrease in size of a small high attenuation cystic lesion extending exophytically from the upper pole of the left kidney.  This is favored to represent a proteinaceous or hemorrhagic cyst that has undergone partial involution, however, strictly speaking, an enhancing cystic renal neoplasm is difficult to entirely exclude.  This could be further evaluated with non emergent renal ultrasound to exclude internal blood flow. 6.  Extensive atherosclerosis. 7.  Probable nonobstructive calculi within the collecting systems of the kidneys bilaterally, as above.   Original Report Authenticated By: Florencia Reasons, M.D.    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  The patient has a percutaneous gastrostomy tube and has had recurrent issues with leakage of tube feeds around the tube entry site.  Presents for conversion to gastrojejunostomy  tube in an effort to decreased leaking around the tube entry site.  Procedures Performed: 1. Conversion of existing gastrostomy tube to a gastrojejunostomy tube under fluoroscopic guidance.  Interventional Radiologist:  Sterling Big, MD  Sedation:  Moderate sedation was not used.  Fluoroscopy time: 24.8 minutes  Contrast volume: 45 ml Omnipaque-300 administered into the GI tract  PROCEDURE/FINDINGS:   Informed consent was obtained from the patient following explanation of the procedure, risks,  benefits and alternatives. The patient understands, agrees and consents for the procedure. All questions were addressed. A time out was performed.  Maximal barrier sterile technique utilized including caps, mask, sterile gowns, sterile gloves, large sterile drape, hand hygiene, and betadine skin prep.  The existing 24-French gastrostomy tube was removed over a wire after deflating the retention balloon.  Using a C2 Cobra catheter and glide wire, the catheter was carefully advanced into the proximal small bowel.  The duodenum and proximal small bowel are fairly tortuous.  Therefore, the superstiff Amplatz wire was advanced through the catheter which straightened out the tortuosity of the bowel. A 26-French gastrojejunostomy tube was initially placed over the Amplatz wire, however the friction was too great and the jejunal limb was damaged. Therefore, the Amplatz wire was exchanged for two stiff Glidewires. A new 26 French gastrojejunostomy tube was then advanced over two stiff Glidewires and the distal tip positioned within the proximal jejunum.  The retention balloon was inflated.  Proximal jejunal placement of the tip was confirmed by a hand injection of contrast material under fluoroscopy.  Intragastric location of the G portion of the tube was also confirmed by gentle hand injection of contrast under fluoroscopy.  The patient tolerated the procedure well, there is no immediate complication.  IMPRESSION:  1. Successful conversion of existing gastrostomy tube to a 26 French gastrojejunostomy tube. The tip of the jejunal arm is in the proximal jejunum and this should decrease leakage of instilled tube feeds from the tube entry site.  2.  If skin breakdown persist around the tube entry site, consider wound care consult.  Signed,  Sterling Big, MD Vascular & Interventional Radiologist [facility]   Original Report Authenticated By: Sterling Big, M.D.    Ir Replc Gastro/colonic Tube Percut  W/fluoro  10/14/2012  *RADIOLOGY REPORT*  Clinical Data: Gastrostomy tube was dislodged and replaced with a Foley catheter.  The patient needs placement of a new gastrostomy tube.  GASTROSTOMY CATHETER REPLACEMENT  Comparison: 10/13/2012  Findings: The Foley catheter and surrounding skin were prepped and draped in a sterile fashion.  Maximal barrier sterile technique was utilized including caps, mask, sterile gowns, sterile gloves, sterile drape, hand hygiene and skin antiseptic.  Contrast was injected through the Foley catheter and demonstrated placement within the stomach.  The Foley catheter was removed over a stiff Glidewire.  A new 26-French balloon retention gastrostomy tube was placed over the wire.  The balloon was inflated with 10 ml of saline.  Contrast injection confirmed placement in the stomach. Fluoroscopic images were taken and saved for this procedure.  IMPRESSION: Successful replacement of the percutaneous gastrostomy tube with fluoroscopy.   Original Report Authenticated By: Richarda Overlie, M.D.    Dg Chest Port 1 View  10/18/2012  *RADIOLOGY REPORT*  Clinical Data: Right lower lobe infiltrate.  PORTABLE CHEST - 1 VIEW  Comparison: 10/19 and 10/07/2012  Findings: PICC tip is in the superior vena cava in good position. Heart size and vascularity are normal and the left lung is clear. The  patchy infiltrate in the right lower lobe appears slightly more prominent than on the prior exam.  No effusions.  IMPRESSION: Patchy infiltrate in the right lower lobe appears slightly more prominent.   Original Report Authenticated By: Gwynn Burly, M.D.    Dg Chest Port 1 View  10/17/2012  *RADIOLOGY REPORT*  Clinical Data: PICC line placement  PORTABLE CHEST - 1 VIEW  Comparison: 10/12/2012  Findings: Cardiomediastinal silhouette is stable.  Persistent patchy infiltrate right infrahilar region.  There is right arm PICC line with tip in SVC.  No diagnostic pneumothorax.  IMPRESSION: Right arm PICC line  with tip in SVC.  No diagnostic pneumothorax. Persistent patchy infiltrate right infrahilar region.   Original Report Authenticated By: Natasha Mead, M.D.    Dg Chest Port 1 View  10/09/2012  *RADIOLOGY REPORT*  Clinical Data: Airspace disease.  PORTABLE CHEST - 1 VIEW  Comparison: 10/07/2012.  Findings: Slight worsening of airspace disease in the right lower lobe.  The appearance is most compatible with right lower lobe pneumonia.  No parapneumonic effusion.  Cardiopericardial silhouette and mediastinal contours are within normal limits.  The left lung remains clear aside from basilar atelectasis.  IMPRESSION: Slight worsening aeration of the right lower lobe pneumonia.   Original Report Authenticated By: Andreas Newport, M.D.          Subjective: Patient denies any chest pain, nausea, vomiting, diarrhea, abdominal pain. He did have a fever last night. Denies any chills or rigors. Denies any tissue or hematuria.  Objective: Filed Vitals:   11/04/12 0624 11/04/12 0942 11/04/12 1438 11/04/12 1524  BP: 93/44  102/37   Pulse: 75  65   Temp:   99.1 F (37.3 C)   TempSrc:   Oral   Resp:   19   Height:      Weight:      SpO2:  97% 99% 98%    Intake/Output Summary (Last 24 hours) at 11/04/12 1622 Last data filed at 11/04/12 1521  Gross per 24 hour  Intake      0 ml  Output    375 ml  Net   -375 ml   Weight change:  Exam:   General:  Pt is alert, follows commands appropriately, not in acute distress  HEENT: No icterus, No thrush, No neck mass, [place]/AT  Cardiovascular: RRR, S1/S2, no rubs, no gallops  Respiratory: Scattered crackles. No wheezes or rhonchi. Diminished breath sounds.  Abdomen: Soft/+BS, non tender, non distended, no guarding; yellow leakage around J-tube site without any erythema or induration.  Extremities: No edema, No lymphangitis, No petechiae, No rashes, no synovitis  Data Reviewed: Basic Metabolic Panel:  Lab 11/04/12 9562 11/03/12 1010 11/01/12 0940  10/30/12 0555  NA 132* 132* 129* 136  K 3.8 4.2 3.9 3.6  CL 93* 93* 93* 99  CO2 36* 33* 30 31  GLUCOSE 91 172* 244* 145*  BUN 21 18 16 17   CREATININE 0.67 0.62 0.64 0.61  CALCIUM 8.1* 8.3* 7.9* 8.0*  MG 2.1 -- 1.7 --  PHOS -- -- -- --   Liver Function Tests:  Lab 11/04/12 0500  AST 42*  ALT 27  ALKPHOS 134*  BILITOT 0.7  PROT 5.7*  ALBUMIN 1.7*   No results found for this basename: LIPASE:5,AMYLASE:5 in the last 168 hours No results found for this basename: AMMONIA:5 in the last 168 hours CBC:  Lab 11/04/12 0500 11/03/12 1010 11/02/12 0841 11/01/12 0940 10/31/12 0500  WBC 6.5 8.4 7.0 7.5 5.3  NEUTROABS -- -- -- -- --  HGB 7.4* 9.0* 8.2* 8.2* 7.9*  HCT 22.9* 27.4* 24.9* 24.5* 23.5*  MCV 89.8 90.7 89.9 89.7 89.0  PLT 165 165 136* 132* 95*   Cardiac Enzymes: No results found for this basename: CKTOTAL:5,CKMB:5,CKMBINDEX:5,TROPONINI:5 in the last 168 hours BNP: No components found with this basename: POCBNP:5 CBG:  Lab 11/04/12 1133 11/04/12 0706 11/03/12 2348 11/03/12 2054 11/03/12 1844  GLUCAP 162* 122* 84 89 151*    No results found for this or any previous visit (from the past 240 hour(s)).   Scheduled Meds:   . albuterol  2.5 mg Nebulization Q6H  . antiseptic oral rinse  15 mL Mouth Rinse BID  . aspirin  81 mg Oral Daily  . budesonide  0.5 mg Nebulization BID  . collagenase   Topical Daily  . cyclobenzaprine  5 mg Oral QHS  . docusate  100 mg Per Tube BID  . feeding supplement  30 mL Per Tube Daily  . free water  200 mL Per Tube Q6H  . insulin aspart  0-9 Units Subcutaneous TID WC  . insulin glargine  5 Units Subcutaneous QHS  . ipratropium  0.5 mg Nebulization Q6H  . levothyroxine  88 mcg Oral QAC breakfast  . magnesium oxide  400 mg Oral BID  . metoCLOPramide  5 mg Oral Q8H  . pantoprazole sodium  40 mg Per Tube BID  . QUEtiapine  50 mg Oral QHS  . thiamine  100 mg Oral Daily  . [DISCONTINUED] feeding supplement  30 mL Oral TID WC   Continuous  Infusions:   . feeding supplement (OSMOLITE 1.5 CAL) 1,000 mL (11/04/12 0103)     Tristin Vandeusen, DO  [facility] Pager [phone]  If 7PM-7AM, please contact night-coverage [email or web address] Password [email or web address] 11/04/2012, 4:22 PM   LOS: 1 day

## 2012-11-04 NOTE — Progress Notes (Signed)
INITIAL ADULT NUTRITION ASSESSMENT Date: 11/04/2012   Time: 9:42 AM  Reason for Assessment: Malnutrition Screening Tool and Nutrition Support  INTERVENTION: 1. To better meet nutrition needs, decrease 30 ml Prostat to daily. Total TF regimen of Osmolite 1.5 at 60 ml/hr with 30 ml Prostat daily will provide: 2260 kcal (38 kcal/kg), 106 grams protein (1.9 grams/kg), 1098 ml free water. Free water flushes of 200 ml QID provide an additional 800 ml water daily. Total water provision provides 1898 ml water (33 ml/kg.) This regimen provides 100% RDI's. 2. RD to continue to follow nutrition care plan  DOCUMENTATION CODES Per approved criteria  -Severe malnutrition in the context of chronic illness   ASSESSMENT: Male 78 y.o.  Dx: PNA and GI bleed  Hx:  Past Medical History  Diagnosis Date  . DM2 (diabetes mellitus, type 2)   . Hyperlipidemia   . Hypertension   . Cerebral vascular disease   . HCC (hepatocellular carcinoma)   . Blood dyscrasia     THROMBCYTOPATHIA  . Peripheral vascular disease   . Pneumonia 11/03/2012   Past Surgical History  Procedure Date  . Carotid stent 2002  . Liver canc   . Posterior cervical fusion/foraminotomy 09/25/2012    Procedure: POSTERIOR CERVICAL FUSION/FORAMINOTOMY LEVEL 2;  Surgeon: Tia Alert, MD;  Location: [facility];  Service: Neurosurgery;  Laterality: N/A;  Posterior Cervical three-four laminectomy, posterior cervical three-four, four-five fusion  . Esophagogastroduodenoscopy 10/01/2012    Procedure: ESOPHAGOGASTRODUODENOSCOPY (EGD);  Surgeon: Cherylynn Ridges, MD;  Location: [facility] ENDOSCOPY;  Service: General;  Laterality: N/A;  wyatt/leone  . Peg placement 10/01/2012    Procedure: PERCUTANEOUS ENDOSCOPIC GASTROSTOMY (PEG) PLACEMENT;  Surgeon: Cherylynn Ridges, MD;  Location: [facility] ENDOSCOPY;  Service: General;  Laterality: N/A;  . Esophagogastroduodenoscopy 10/31/2012    Procedure: ESOPHAGOGASTRODUODENOSCOPY (EGD);  Surgeon: Theda Belfast, MD;   Location: [facility] ENDOSCOPY;  Service: Endoscopy;  Laterality: N/A;   Related Meds:     . albuterol  2.5 mg Nebulization Q6H  . antiseptic oral rinse  15 mL Mouth Rinse BID  . aspirin  81 mg Oral Daily  . budesonide  0.5 mg Nebulization BID  . cyclobenzaprine  5 mg Oral QHS  . docusate  100 mg Per Tube BID  . feeding supplement  30 mL Oral TID WC  . free water  200 mL Per Tube Q6H  . insulin aspart  0-9 Units Subcutaneous TID WC  . insulin glargine  5 Units Subcutaneous QHS  . ipratropium  0.5 mg Nebulization Q6H  . levothyroxine  88 mcg Oral QAC breakfast  . magnesium oxide  400 mg Oral BID  . metoCLOPramide  5 mg Oral Q8H  . pantoprazole sodium  40 mg Per Tube BID  . QUEtiapine  50 mg Oral QHS  . thiamine  100 mg Oral Daily  . [DISCONTINUED] docusate sodium  100 mg Oral BID  . [DISCONTINUED] levothyroxine  88 mcg Oral Daily   Ht: 5\' 9"  (175.3 cm)  Wt: 125 lb 10.6 oz (57 kg) (bedscale )  Ideal Wt: 160 lb/72.7 kg % Ideal Wt: 78%  Wt Readings from Last 15 Encounters:  11/04/12 125 lb 10.6 oz (57 kg)  11/03/12 128 lb 4.9 oz (58.2 kg)  11/03/12 128 lb 4.9 oz (58.2 kg)  10/02/12 124 lb 5.4 oz (56.4 kg)  10/02/12 124 lb 5.4 oz (56.4 kg)  09/25/12 140 lb (63.504 kg)  10/02/12 124 lb 5.4 oz (56.4 kg)  04/08/12 146 lb (66.225 kg)  Usual Wt: 146 lb % Usual Wt: 86%; 14% wt loss x 7 months  Body mass index is 18.56 kg/(m^2). Weight is WNL  Food/Nutrition Related Hx: pt with J-tube for nutrition  Labs:  CMP     Component Value Date/Time   NA 132* 11/04/2012 0500   K 3.8 11/04/2012 0500   CL 93* 11/04/2012 0500   CO2 36* 11/04/2012 0500   GLUCOSE 91 11/04/2012 0500   BUN 21 11/04/2012 0500   CREATININE 0.67 11/04/2012 0500   CALCIUM 8.1* 11/04/2012 0500   PROT 5.7* 11/04/2012 0500   ALBUMIN 1.7* 11/04/2012 0500   AST 42* 11/04/2012 0500   ALT 27 11/04/2012 0500   ALKPHOS 134* 11/04/2012 0500   BILITOT 0.7 11/04/2012 0500   GFRNONAA 89* 11/04/2012 0500   GFRAA >90 11/04/2012 0500    Sodium  Date/Time Value Range Status  11/04/2012  5:00 AM 132* 135 - 145 mEq/L Final  11/03/2012 10:10 AM 132* 135 - 145 mEq/L Final  11/01/2012  9:40 AM 129* 135 - 145 mEq/L Final    Potassium  Date/Time Value Range Status  11/04/2012  5:00 AM 3.8  3.5 - 5.1 mEq/L Final  11/03/2012 10:10 AM 4.2  3.5 - 5.1 mEq/L Final  11/01/2012  9:40 AM 3.9  3.5 - 5.1 mEq/L Final    Phosphorus  Date/Time Value Range Status  10/23/2012  5:00 AM 3.0  2.3 - 4.6 mg/dL Final  95/28/4132  4:40 AM 2.2* 2.3 - 4.6 mg/dL Final  10/27/2535  6:44 AM 1.9* 2.3 - 4.6 mg/dL Final    Magnesium  Date/Time Value Range Status  11/04/2012  5:00 AM 2.1  1.5 - 2.5 mg/dL Final  03/03/7424  9:56 AM 1.7  1.5 - 2.5 mg/dL Final  38/75/6433  2:95 AM 1.7  1.5 - 2.5 mg/dL Final   Prealbumin  Date/Time Value Range Status  11/03/2012  2:38 PM 7.6* 17.0 - 34.0 mg/dL Final    Intake/Output Summary (Last 24 hours) at 11/04/12 0948 Last data filed at 11/04/12 0858  Gross per 24 hour  Intake      0 ml  Output    450 ml  Net   -450 ml   Diet Order:  (no diet order - RD to clarify)  Supplements/Tube Feeding: Osmolite 1.5 at 60 ml/hr with 30 ml Prostat via tube TID; provides 2460 kcal, 136 grams protein, 1098 ml free water  Free water flushes of 200 ml QID; provides additional 800 ml water daily  IVF:    feeding supplement (OSMOLITE 1.5 CAL) Last Rate: 1,000 mL (11/04/12 0103)   Estimated Nutritional Needs:   Kcal: 1800 - 2100 kcal Protein:  80 - 95 grams protein Fluid:  1.8 - 2.1 liters daily  This RD is familiar with patient from recent inpatient rehabilitation stay. Pt with hx of weight loss, with approximately 14% wt loss x 7 months. During initial acute hospitalization, PEG was placed on 10/2. Pt was transferred to rehab for deconditioning. Tolerated enteral nutrition rate and formula during that time, however PEG site developed issues with leaking. Pt transitioned to higher concentrated formula to reduce total volume  of enteral nutrition given high risk for aspiration. G-tube converted to G-J tube on 10/29.  Transferred to acute hospital; has had ongoing issues with desaturations and lethargy. Rapid Response called on 11/3 for desaturation episode. Pt also with heme positive stools. EGD on 11/1 was negative. Colonoscopy planned for tomorrow.  Seen by SLP today, per SLP pt may not have  improved enough to safely take PO's however, ST could perform MBS if MD desires.  Pt with 2 stage II wounds.  Noted current enteral nutrition regimen via J-tube is Osmolite 1.5 at 60 ml/hr with 30 ml Prostat via tube TID; provides 2460 kcal, 136 grams protein, 1098 ml free water. This provides 43 kcal/kg and 2.4 grams protein/kg. Free water flushes of 200 ml QID; provides additional 800 ml water daily. Will decrease Prostat to daily. This will still provide adequate calories and protein for weight gain.  Nutrition-focused physical exam reveals severe muscle and fat wasting in upper and lower extremities.  Pt meets criteria for severe malnutrition in the context of chronic illness as evidenced by wt loss of 14% x 7 months and severe muscle and fat wasting.  NUTRITION DIAGNOSIS: Swallowing difficulty r/t cervical fracture AEB j-tube required for nutrition.  MONITORING/EVALUATION(Goals): Goal: Pt to meet >/= 90% of their estimated nutrition needs Monitor: weight trends, lab trends, I/O's, TF tolerance/rate  EDUCATION NEEDS: -No education needs identified at this time  Jarold Motto MS, RD, LDN Pager: [phone] After-hours pager: [phone]

## 2012-11-05 ENCOUNTER — Inpatient Hospital Stay (HOSPITAL_COMMUNITY): Payer: PRIVATE HEALTH INSURANCE

## 2012-11-05 ENCOUNTER — Encounter (HOSPITAL_COMMUNITY): Payer: Self-pay | Admitting: Radiology

## 2012-11-05 DIAGNOSIS — F1011 Alcohol abuse, in remission: Secondary | ICD-10-CM

## 2012-11-05 LAB — GLUCOSE, CAPILLARY: Glucose-Capillary: 78 mg/dL (ref 70–99)

## 2012-11-05 LAB — BASIC METABOLIC PANEL
BUN: 16 mg/dL (ref 6–23)
GFR calc non Af Amer: >90 mL/min (ref >90)
Glucose, Bld: 76 mg/dL (ref 70–99)
Potassium: 3.7 mEq/L (ref 3.5–5.1)

## 2012-11-05 LAB — CBC WITH DIFFERENTIAL/PLATELET
Eosinophils Absolute: 0.2 10*3/uL (ref 0.0–0.7)
Eosinophils Relative: 4 % (ref 0–5)
Hemoglobin: 7.7 g/dL — ABNORMAL LOW (ref 13.0–17.0)
Lymphs Abs: 1.5 10*3/uL (ref 0.7–4.0)
MCH: 30.1 pg (ref 26.0–34.0)
MCV: 90.6 fL (ref 78.0–100.0)
Monocytes Relative: 15 % — ABNORMAL HIGH (ref 3–12)
Neutrophils Relative %: 56 % (ref 43–77)
RBC: 2.56 MIL/uL — ABNORMAL LOW (ref 4.22–5.81)

## 2012-11-05 MED ORDER — THIAMINE HCL 100 MG/ML IJ SOLN
100.0000 mg | Freq: Every day | INTRAMUSCULAR | Status: DC
  Administered 2012-11-05 – 2012-11-09 (×5): 100 mg via INTRAVENOUS
  Filled 2012-11-05 (×5): qty 1

## 2012-11-05 MED ORDER — LEVOTHYROXINE SODIUM 100 MCG IV SOLR
44.0000 ug | Freq: Every day | INTRAVENOUS | Status: DC
  Administered 2012-11-06 – 2012-11-09 (×4): 44 ug via INTRAVENOUS
  Filled 2012-11-05 (×5): qty 2.2

## 2012-11-05 MED ORDER — IOHEXOL 300 MG/ML  SOLN
50.0000 mL | Freq: Once | INTRAMUSCULAR | Status: AC | PRN
  Administered 2012-11-05: 20 mL via INTRAVENOUS

## 2012-11-05 MED ORDER — IOHEXOL 300 MG/ML  SOLN
100.0000 mL | Freq: Once | INTRAMUSCULAR | Status: AC | PRN
  Administered 2012-11-05: 100 mL via INTRAVENOUS

## 2012-11-05 MED ORDER — LEVOTHYROXINE SODIUM 100 MCG IV SOLR
25.0000 ug | Freq: Every day | INTRAVENOUS | Status: DC
  Administered 2012-11-05: 26 ug via INTRAVENOUS
  Filled 2012-11-05: qty 1.3

## 2012-11-05 NOTE — Progress Notes (Signed)
Pt and pts daughter requesting pt be transferred to [facility] to continue care.  Notified Dr. Lavera Guise and gave names of pts Doctors at [facility].  Will continue to monitor pt.
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
## 2012-11-05 NOTE — Progress Notes (Signed)
Shift event:  RN paged NP because pt's TF leaking around GT stoma onto the bed. Constantly soaking the sheets. NP to bedside. The GT area is slightly erythemic with yellowish discharge.  1. Leaking at GT site with drainage-culture discharge. Will leave TF off for tonight, await CT abd results and see if surgery needs to take action about GTube.  Maren Reamer, NP [facility]

## 2012-11-05 NOTE — Procedures (Signed)
SLP has reviewed and agrees with student's note below. [name] [place].Ed [employer] [phone]  11/05/2012

## 2012-11-05 NOTE — Progress Notes (Signed)
Pts jejunal tube occluded.  Notified Dr. Deanne Coffer and interventional radiology.

## 2012-11-05 NOTE — Progress Notes (Signed)
I had a discussion with the patient and his daughter.  They want to go to [facility] as they do not feel that he is progressing well here.  His HGB is relatively stable.  He does have a significant amount of drainage from his PEG site.  At this time I will not pursue any further intervention.

## 2012-11-05 NOTE — Progress Notes (Signed)
Continuous tube feed discontinued at approx 0115 hrs after oncall NP eval.  Culture of drainage around GT sent to lab.  Patient understands the need for discontinuing the continuous feeds and agrees with the plan.  Will obtain CT abd/pelvis with contrast as ordered.  Will continue to monitor.  AMarybelle Killings
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
## 2012-11-05 NOTE — Progress Notes (Signed)
Utilization Review Completed.  

## 2012-11-05 NOTE — Progress Notes (Signed)
[facility] PROGRESS NOTE  [name] WUJ:[id number] DOB: 05-06-1931 DOA: 11/03/2012 PCP: Bebe Liter, NP  Assessment/Plan: New fever on 11/5  -Spiked temperature 101.59F on 11/5  -UA  11/04/12 TNTC bacteria  - urine culture 11/5 -P -Blood cultures x2 sets 11/5 - P -Started  empiric Zosyn after cultures 11/5 -CT chest with RLL infiltrates suggestive of aspiration pneumonia  Pulmonary infiltrates  -Spiked fever 101.59F on 11/5  -Certainly, the patient may have a degree of microaspiration with which the patient will need good oral care and aspiration precautions with head of bed greater than 30  -Good oral hygiene, continued biotene  -speech/swallow evaluation--> modified barium swallow tomorrow -No episodes of apnea or oxygen desaturations noted by nursing staff Anemia with heme positive stools  -EGD was negative on 10/31/2012  -Gastroenterology, Dr. Elnoria Howard was following until 11/05/12 when patient said he wants to go to [facility].  -Monitor hemoglobin and transfuse as necessary . Hemoglobin went up from 7.4 to 7.7 from 11/5 until 11/6   Gtube drainage -discussed with [facility]-->local care for now unless there's skin breakdown -MBS scheduled for tommorow by speech therapy, hopeful he passes and he can take po -CT abdomen 11/6 ruled out abdominal wall abscess - IR consult for TF leaking around G insertion site  Atrial flutter  -Patient is rate controlled at this time  -Continue aspirin 81 mg daily  -He is not a candidate for anticoagulation due to his heme positive stool and anemia  -Patient was previously in sinus, may have contributed to the patient's anemia  - [facility] following  COPD  - Continue maintenance oxygen at 3 L  -Continue albuterol and Atrovent nebulizers as well as Pulmicort  -Patient is not strong enough for an MDI at this point  -He has followup with pulmonary, Dr. Delford Field on 12/01/2012 at 3:15 PM  Diabetes mellitus type 2  -Check hemoglobin A1c  -Continue  Lantus 5 units at bedtime pending hemoglobin A1c  -NovoLog sliding scale every 6 hours  -Check lipids  Thrombocytopenia  -This appears to be chronic likely due to bone marrow suppression from his multiple morbidities  -Check coags  Dysphagia  Hypothyroidism  -Continue Synthroid  -TSH 2.659 10/12/2012  Deconditioning/C3-C4 cervical spine fracture  -Maintain patient cervical collar  - will d/w Dr. Yetta Barre timing of collar removal.   I have personally called [facility] today - they are on divert due to lack of beds. Will try again tomorrow.      Family Communication:   Daughter  Disposition Plan:  ?     Procedures/Studies: Dg Chest 2 View  11/02/2012  *RADIOLOGY REPORT*  Clinical Data: Cough. Pneumonia.  CHEST - 2 VIEW  Comparison: Chest x-ray 10/27/2012.  Findings: Lung volumes are normal.  Extensive patchy interstitial and airspace disease is noted throughout the right mid and lower lung.  To a lesser extent, similar findings are also noted in the contralateral lung.  Trace bilateral pleural effusions.  Pulmonary vasculature is normal.  Heart size is borderline enlarged. Mediastinal contours are unremarkable.  Atherosclerosis in the thoracic aorta.  Previously noted right upper extremity PICC has been removed. Multiple surgical clips project over the right upper quadrant of the abdomen.  IMPRESSION: 1.  Findings remain concerning for multilobar pneumonia, predominantly in the right lower lobe, but with some evidence of early endobronchial spread to the left lower lobe as well.   Original Report Authenticated By: Trudie Reed, M.D.    Dg Chest 2 View  10/27/2012  *RADIOLOGY REPORT*  Clinical  Data: Pneumonia  CHEST - 2 VIEW  Comparison: 10/20/2012  Findings: There is a right arm PICC line with tip in the SVC. Heart size appears normal.  No pleural effusion or edema.  Interval progression of right lower lobe airspace consolidation.  Review of the visualized osseous structures is unremarkable.   IMPRESSION:  1.  Interval progression of right lower lobe pneumonia.   Original Report Authenticated By: Rosealee Albee, M.D.    Dg Chest 2 View  10/20/2012  *RADIOLOGY REPORT*  Clinical Data: Pneumonia and shortness of breath.  CHEST - 2 VIEW  Comparison: Chest x-ray 10/18/2012.  Findings: Extensive right lower lobe air space consolidation is again noted, compatible with severe bronchopneumonia.  Lungs otherwise appear clear.  Small bilateral pleural effusions.  No evidence of pulmonary edema.  Heart size is normal.  Mediastinal contours are unremarkable.  Atherosclerosis in the thoracic aorta. There is a right upper extremity PICC with tip terminating in the proximal superior vena cava.  IMPRESSION: 1.  Persistent severe right lower lobe bronchopneumonia. 2.  Small bilateral pleural effusions. 3.  Atherosclerosis.   Original Report Authenticated By: Florencia Reasons, M.D.    Dg Chest 2 View  10/12/2012  *RADIOLOGY REPORT*  Clinical Data: Pneumonia.  CHEST - 2 VIEW  Comparison: 10/09/2012  Findings: Lordotic positioning noted. Decreased right lower lobe infiltrate is seen compared to prior study.  Tiny posterior pleural effusions noted.  Heart size is within normal limits.  IMPRESSION: Improving right lower lobe infiltrate.   Original Report Authenticated By: Danae Orleans, M.D.    Dg Chest 2 View  10/07/2012  *RADIOLOGY REPORT*  Clinical Data: Shortness of breath, weakness, cough and leukocytosis.  CHEST - 2 VIEW  Comparison: Chest x-ray 09/28/2012.  Findings: Compared to the prior examination there is worsening multifocal interstitial and airspace opacity throughout the right lower lobe, concerning for pneumonia.  Left lung is clear. Probable trace right pleural effusion. Lungs appear mildly hyperexpanded with some increased retrosternal air space and pruning of the pulmonary vasculature in the periphery, compatible with underlying COPD.  Mediastinal contours are unremarkable. Atherosclerosis in  the thoracic aorta.  IMPRESSION: 1.  Interval development of patchy interstitial and airspace opacities throughout the right lower lobe highly concerning for pneumonia. 2.  Probable trace right pleural effusion. 3.  Chronic changes of COPD redemonstrated. 4.  Atherosclerosis.  These results will be called to the ordering clinician or representative by the Radiologist Assistant, and communication documented in the PACS Dashboard.   Original Report Authenticated By: Florencia Reasons, M.D.    Dg Abd 1 View  10/13/2012  *RADIOLOGY REPORT*  Clinical Data: Peg tube reinsertion.  ABDOMEN - 1 VIEW  Comparison: None.  Findings: Contrast has been injected through the peg tube and the injected contrast is all within the stomach and duodenal bulb.  There is faint contrast throughout the nondistended colon.  No small bowel dilatation.  Numerous surgical clips in the right upper quadrant.  IMPRESSION: Peg tube appears in good position.   Original Report Authenticated By: Gwynn Burly, M.D.    Ct Abdomen W Contrast  10/17/2012  *RADIOLOGY REPORT*  Clinical Data: Draining G-tube site.  Evaluate for underlying abscess.  CT ABDOMEN WITH CONTRAST  Technique:  Multidetector CT imaging of the abdomen was performed following the standard protocol during bolus administration of intravenous contrast.  Contrast: 80mL OMNIPAQUE IOHEXOL 300 MG/ML  SOLN  Comparison: CT of the abdomen and pelvis 11/23/2010.  Findings:  Lung Bases: Extensive  bronchial wall thickening and peribronchovascular micro- and macronodularity in the right lower lobe, as well as some consolidative air space disease in the medial aspect of the right lower lobe, concerning for sequela of aspiration.  Similar findings are present to a much lesser extent in the posterior aspect of the left lower lobe.  Abdomen: A percutaneous enterogastric tube is noted.  The retention balloon on the tip of the tube is properly located within the gastric lumen in the proximal  body.  No well-defined rim enhancing abnormal fluid collections are noted along the course of the catheter to suggest the presence of a abscess.  There is a small locule of gas adjacent to the catheter within the anterior abdominal wall, and a trace amount of fluid; this is nonspecific and within normal limits for a recently replaced catheter.  Status post right hemihepatectomy and cholecystectomy.  Within the remaining liver there are no focal cystic or solid hepatic lesions. The appearance of the pancreas and bilateral adrenal glands is unremarkable.  In the renal hila bilaterally and there are small calcifications measuring 4 mm suspicious for nonobstructive calculi.  The previously noted exophytic cysts in the upper pole of the left kidney has decreased in size, currently measuring only 12 mm; this lesion measures approximately 50 HU, suggesting proteinaceous or hemorrhagic contents.  There is an additional 12 mm cyst in the superior aspect of the left kidney.  Chronic parenchymal atrophy and scarring in the lower pole of the left kidney is similar to the prior examination.  There is a new area of subcapsular low attenuation and focal linear low attenuation region in the medial aspect of the spleen, which may represent sequela of recent trauma (i.e., splenic laceration with resolving subcapsular hematoma).  Extensive atherosclerosis of the visualized abdominal vasculature.  Within the visualized abdomen there is no ascites, pneumoperitoneum or pathologic distension of small bowel.  Musculoskeletal: There are no aggressive appearing lytic or blastic lesions noted in the visualized portions of the skeleton.  IMPRESSION:  1.  No evidence of abscess associated with the percutaneous enterogastric tube.  There is a tiny locule of gas and a trace amount of fluid adjacent to the tube within the anterior abdominal wall, which is within normal limits for a recently replaced catheter. 2.  Findings suggestive of aspiration  pneumonia in the right lower lobe and to a lesser extent and posterior aspect of the left lower lobe.  Clinical correlation is recommended. 3.  The appearance of the spleen suggests sequelae of recent trauma with a resolving splenic laceration and subcapsular hematoma. 4.  Status post right hemihepatectomy.  No suspicious appearing hepatic lesion in the left hepatic lobe. 5.  Interval decrease in size of a small high attenuation cystic lesion extending exophytically from the upper pole of the left kidney.  This is favored to represent a proteinaceous or hemorrhagic cyst that has undergone partial involution, however, strictly speaking, an enhancing cystic renal neoplasm is difficult to entirely exclude.  This could be further evaluated with non emergent renal ultrasound to exclude internal blood flow. 6.  Extensive atherosclerosis. 7.  Probable nonobstructive calculi within the collecting systems of the kidneys bilaterally, as above.   Original Report Authenticated By: Florencia Reasons, M.D.    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  The  patient has a percutaneous gastrostomy tube and has had recurrent issues with leakage of tube feeds around the tube entry site.  Presents for conversion to gastrojejunostomy tube in an effort to decreased leaking around the tube entry site.  Procedures Performed: 1. Conversion of existing gastrostomy tube to a gastrojejunostomy tube under fluoroscopic guidance.  Interventional Radiologist:  Sterling Big, MD  Sedation:  Moderate sedation was not used.  Fluoroscopy time: 24.8 minutes  Contrast volume: 45 ml Omnipaque-300 administered into the GI tract  PROCEDURE/FINDINGS:   Informed consent was obtained from the patient following explanation of  the procedure, risks, benefits and alternatives. The patient understands, agrees and consents for the procedure. All questions were addressed. A time out was performed.  Maximal barrier sterile technique utilized including caps, mask, sterile gowns, sterile gloves, large sterile drape, hand hygiene, and betadine skin prep.  The existing 24-French gastrostomy tube was removed over a wire after deflating the retention balloon.  Using a C2 Cobra catheter and glide wire, the catheter was carefully advanced into the proximal small bowel.  The duodenum and proximal small bowel are fairly tortuous.  Therefore, the superstiff Amplatz wire was advanced through the catheter which straightened out the tortuosity of the bowel. A 26-French gastrojejunostomy tube was initially placed over the Amplatz wire, however the friction was too great and the jejunal limb was damaged. Therefore, the Amplatz wire was exchanged for two stiff Glidewires. A new 26 French gastrojejunostomy tube was then advanced over two stiff Glidewires and the distal tip positioned within the proximal jejunum.  The retention balloon was inflated.  Proximal jejunal placement of the tip was confirmed by a hand injection of contrast material under fluoroscopy.  Intragastric location of the G portion of the tube was also confirmed by gentle hand injection of contrast under fluoroscopy.  The patient tolerated the procedure well, there is no immediate complication.  IMPRESSION:  1. Successful conversion of existing gastrostomy tube to a 26 French gastrojejunostomy tube. The tip of the jejunal arm is in the proximal jejunum and this should decrease leakage of instilled tube feeds from the tube entry site.  2.  If skin breakdown persist around the tube entry site, consider wound care consult.  Signed,  Sterling Big, MD Vascular & Interventional Radiologist [facility] Radiology   Original Report Authenticated By: Sterling Big, M.D.    Ir Replc  Gastro/colonic Tube Percut W/fluoro  10/14/2012  *RADIOLOGY REPORT*  Clinical Data: Gastrostomy tube was dislodged and replaced with a Foley catheter.  The patient needs placement of a new gastrostomy tube.  GASTROSTOMY CATHETER REPLACEMENT  Comparison: 10/13/2012  Findings: The Foley catheter and surrounding skin were prepped and draped in a sterile fashion.  Maximal barrier sterile technique was utilized including caps, mask, sterile gowns, sterile gloves, sterile drape, hand hygiene and skin antiseptic.  Contrast was injected through the Foley catheter and demonstrated placement within the stomach.  The Foley catheter was removed over a stiff Glidewire.  A new 26-French balloon retention gastrostomy tube was placed over the wire.  The balloon was inflated with 10 ml of saline.  Contrast injection confirmed placement in the stomach. Fluoroscopic images were taken and saved for this procedure.  IMPRESSION: Successful replacement of the percutaneous gastrostomy tube with fluoroscopy.   Original Report Authenticated By: Richarda Overlie, M.D.    Dg Chest Port 1 View  10/18/2012  *RADIOLOGY REPORT*  Clinical Data: Right lower lobe infiltrate.  PORTABLE CHEST - 1 VIEW  Comparison: 10/19 and  10/07/2012  Findings: PICC tip is in the superior vena cava in good position. Heart size and vascularity are normal and the left lung is clear. The patchy infiltrate in the right lower lobe appears slightly more prominent than on the prior exam.  No effusions.  IMPRESSION: Patchy infiltrate in the right lower lobe appears slightly more prominent.   Original Report Authenticated By: Gwynn Burly, M.D.    Dg Chest Port 1 View  10/17/2012  *RADIOLOGY REPORT*  Clinical Data: PICC line placement  PORTABLE CHEST - 1 VIEW  Comparison: 10/12/2012  Findings: Cardiomediastinal silhouette is stable.  Persistent patchy infiltrate right infrahilar region.  There is right arm PICC line with tip in SVC.  No diagnostic pneumothorax.   IMPRESSION: Right arm PICC line with tip in SVC.  No diagnostic pneumothorax. Persistent patchy infiltrate right infrahilar region.   Original Report Authenticated By: Natasha Mead, M.D.    Dg Chest Port 1 View  10/09/2012  *RADIOLOGY REPORT*  Clinical Data: Airspace disease.  PORTABLE CHEST - 1 VIEW  Comparison: 10/07/2012.  Findings: Slight worsening of airspace disease in the right lower lobe.  The appearance is most compatible with right lower lobe pneumonia.  No parapneumonic effusion.  Cardiopericardial silhouette and mediastinal contours are within normal limits.  The left lung remains clear aside from basilar atelectasis.  IMPRESSION: Slight worsening aeration of the right lower lobe pneumonia.   Original Report Authenticated By: Andreas Newport, M.D.          Subjective: .worried about losing wieght  Objective: Filed Vitals:   11/04/12 2049 11/04/12 2110 11/05/12 0609 11/05/12 0849  BP: 127/61  100/33   Pulse: 69  69   Temp: 97.6 F (36.4 C)  97.6 F (36.4 C)   TempSrc: Oral  Oral   Resp: 20  18   Height:      Weight:      SpO2: 99% 98% 98% 90%    Intake/Output Summary (Last 24 hours) at 11/05/12 0914 Last data filed at 11/05/12 0843  Gross per 24 hour  Intake    150 ml  Output    950 ml  Net   -800 ml   Weight change:  Exam:   Frail  Alert an doriented   Cvs: rrr  Rs: clear ant   Hard collar on   Abdomen soft    Data Reviewed: Basic Metabolic Panel:  Lab 11/05/12 1610 11/04/12 0500 11/03/12 1010 11/01/12 0940 10/30/12 0555  NA 133* 132* 132* 129* 136  K 3.7 3.8 4.2 3.9 3.6  CL 93* 93* 93* 93* 99  CO2 35* 36* 33* 30 31  GLUCOSE 76 91 172* 244* 145*  BUN 16 21 18 16 17   CREATININE 0.62 0.67 0.62 0.64 0.61  CALCIUM 8.1* 8.1* 8.3* 7.9* 8.0*  MG -- 2.1 -- 1.7 --  PHOS -- -- -- -- --   Liver Function Tests:  Lab 11/04/12 0500  AST 42*  ALT 27  ALKPHOS 134*  BILITOT 0.7  PROT 5.7*  ALBUMIN 1.7*   No results found for this basename:  LIPASE:5,AMYLASE:5 in the last 168 hours No results found for this basename: AMMONIA:5 in the last 168 hours CBC:  Lab 11/05/12 0720 11/04/12 0500 11/03/12 1010 11/02/12 0841 11/01/12 0940  WBC 5.9 6.5 8.4 7.0 7.5  NEUTROABS 3.3 -- -- -- --  HGB 7.7* 7.4* 9.0* 8.2* 8.2*  HCT 23.2* 22.9* 27.4* 24.9* 24.5*  MCV 90.6 89.8 90.7 89.9 89.7  PLT 176 165 165  136* 132*   Cardiac Enzymes: No results found for this basename: CKTOTAL:5,CKMB:5,CKMBINDEX:5,TROPONINI:5 in the last 168 hours BNP: No components found with this basename: POCBNP:5 CBG:  Lab 11/05/12 0613 11/04/12 2125 11/04/12 1600 11/04/12 1133 11/04/12 0706  GLUCAP 80 120* 75 162* 122*    Recent Results (from the past 240 hour(s))  CULTURE, BLOOD (ROUTINE X 2)     Status: Normal (Preliminary result)   Collection Time   11/04/12  5:00 PM      Component Value Range Status Comment   Specimen Description BLOOD LEFT ARM   Final    Special Requests BOTTLES DRAWN AEROBIC AND ANAEROBIC 10CC   Final    Culture  Setup Time 11/04/2012 21:58   Final    Culture     Final    Value:        BLOOD CULTURE RECEIVED NO GROWTH TO DATE CULTURE WILL BE HELD FOR 5 DAYS BEFORE ISSUING A FINAL NEGATIVE REPORT   Report Status PENDING   Incomplete   CULTURE, BLOOD (ROUTINE X 2)     Status: Normal (Preliminary result)   Collection Time   11/04/12  5:10 PM      Component Value Range Status Comment   Specimen Description BLOOD LEFT HAND   Final    Special Requests BOTTLES DRAWN AEROBIC AND ANAEROBIC 10CC   Final    Culture  Setup Time 11/04/2012 21:58   Final    Culture     Final    Value:        BLOOD CULTURE RECEIVED NO GROWTH TO DATE CULTURE WILL BE HELD FOR 5 DAYS BEFORE ISSUING A FINAL NEGATIVE REPORT   Report Status PENDING   Incomplete      Scheduled Meds:    . albuterol  2.5 mg Nebulization Q6H  . antiseptic oral rinse  15 mL Mouth Rinse BID  . aspirin  81 mg Oral Daily  . budesonide  0.5 mg Nebulization BID  . collagenase   Topical Daily   . cyclobenzaprine  5 mg Oral QHS  . docusate  100 mg Per Tube BID  . feeding supplement  30 mL Per Tube Daily  . free water  200 mL Per Tube Q6H  . insulin aspart  0-9 Units Subcutaneous TID WC  . insulin glargine  5 Units Subcutaneous QHS  . [COMPLETED] iohexol  20 mL Oral Q1 Hr x 2  . ipratropium  0.5 mg Nebulization Q6H  . levothyroxine  88 mcg Oral QAC breakfast  . magnesium oxide  400 mg Oral BID  . metoCLOPramide  5 mg Oral Q8H  . pantoprazole sodium  40 mg Per Tube BID  . piperacillin-tazobactam  3.375 g Intravenous Q8H  . QUEtiapine  50 mg Oral QHS  . thiamine  100 mg Oral Daily  . [DISCONTINUED] feeding supplement  30 mL Oral TID WC   Continuous Infusions:    . feeding supplement (OSMOLITE 1.5 CAL) 1,000 mL (11/04/12 0103)     Jeorgia Helming, MD  [facility] Pager [phone]  If 7PM-7AM, please contact night-coverage [email or web address] Password [email or web address] 11/05/2012, 9:14 AM   LOS: 2 days

## 2012-11-05 NOTE — Progress Notes (Signed)
Pt complained of GT draining and bed wet. In to assess and area around side raised and red, also noted seeing a continuous stream of tube feed with the running of the machine. MD notified.

## 2012-11-06 ENCOUNTER — Inpatient Hospital Stay (HOSPITAL_COMMUNITY): Payer: PRIVATE HEALTH INSURANCE

## 2012-11-06 LAB — URINE CULTURE

## 2012-11-06 LAB — GLUCOSE, CAPILLARY: Glucose-Capillary: 173 mg/dL — ABNORMAL HIGH (ref 70–99)

## 2012-11-06 MED ORDER — DIPHENHYDRAMINE HCL 50 MG/ML IJ SOLN
12.5000 mg | Freq: Once | INTRAMUSCULAR | Status: AC
  Administered 2012-11-06: 12.5 mg via INTRAVENOUS
  Filled 2012-11-06: qty 0.25
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
## 2012-11-06 NOTE — Progress Notes (Signed)
Nutrition Follow-up  Intervention:   1. Continue current EN regimen to meet nutrition needs  2. RD will continue to follow    Assessment:   Spoke with pt's RN about tube feeding. Pt had been seen by IR and feeding was infusing into G port instead of J port. J port was found to be occluded. Currently tube feeding is infusing into the J port and G port is connected to suction. RN reports no leaking at PEG site after suction started.  ?possible transfer to [facility]?   TF: Osmolite 1.5 at 60 ml/hr  Supplements: 30 ml Pro-stat once daily   Provides 2260 kcal, 106 gm protein and 1098 ml free water daily.  Additional free water: 200 ml QID. Providing 800 ml additional water.    Diet Order:  NPO- continues to work with speech.   Meds: Scheduled Meds:   . albuterol  2.5 mg Nebulization Q6H  . antiseptic oral rinse  15 mL Mouth Rinse BID  . aspirin  81 mg Oral Daily  . budesonide  0.5 mg Nebulization BID  . collagenase   Topical Daily  . feeding supplement  30 mL Per Tube Daily  . free water  200 mL Per Tube Q6H  . insulin aspart  0-9 Units Subcutaneous TID WC  . ipratropium  0.5 mg Nebulization Q6H  . levothyroxine  44 mcg Intravenous Q breakfast  . piperacillin-tazobactam  3.375 g Intravenous Q8H  . thiamine  100 mg Intravenous Daily  . [DISCONTINUED] levothyroxine  26 mcg Intravenous Daily   Continuous Infusions:   . feeding supplement (OSMOLITE 1.5 CAL) 1,000 mL (11/05/12 1527)   PRN Meds:.acetaminophen (TYLENOL) oral liquid 160 mg/5 mL, antiseptic oral rinse, [COMPLETED] iohexol, LORazepam, LORazepam, ondansetron (ZOFRAN) IV   CMP     Component Value Date/Time   NA 133* 11/05/2012 0720   K 3.7 11/05/2012 0720   CL 93* 11/05/2012 0720   CO2 35* 11/05/2012 0720   GLUCOSE 76 11/05/2012 0720   BUN 16 11/05/2012 0720   CREATININE 0.62 11/05/2012 0720   CALCIUM 8.1* 11/05/2012 0720   PROT 5.7* 11/04/2012 0500   ALBUMIN 1.7* 11/04/2012 0500   AST 42* 11/04/2012 0500   ALT 27 11/04/2012  0500   ALKPHOS 134* 11/04/2012 0500   BILITOT 0.7 11/04/2012 0500   GFRNONAA >90 11/05/2012 0720   GFRAA >90 11/05/2012 0720    CBG (last 3)   Basename 11/06/12 1114 11/06/12 0605 11/05/12 2045  GLUCAP 186* 251* 156*     Intake/Output Summary (Last 24 hours) at 11/06/12 1412 Last data filed at 11/06/12 1308  Gross per 24 hour  Intake      0 ml  Output    250 ml  Net   -250 ml    Weight Status:  121 lbs, trending down in the last 2 days   Re-estimated needs:  1800-2100 kcal, 80-95 gm proten  Nutrition Dx:  Swallowing difficulty r/t cervical fracture AEB j-tube required for nutrition   Goal:  Pt to meet >/=90% estimated nutrition needs.  --met with EN  Monitor:  Weight trends, labs, I/O's, PEG-J and EN tolerance    Clarene [facility] RD, LDN Pager [phone] After Hours pager [phone]

## 2012-11-06 NOTE — Progress Notes (Signed)
Pt's Gtube still leaking on assessment this AM, after IR attempted to fix yesterday evening.  Notified Dr. Lavera Guise.
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
## 2012-11-07 LAB — GLUCOSE, CAPILLARY
Glucose-Capillary: 120 mg/dL — ABNORMAL HIGH (ref 70–99)
Glucose-Capillary: 124 mg/dL — ABNORMAL HIGH (ref 70–99)
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
## 2012-11-08 LAB — WOUND CULTURE

## 2012-11-08 LAB — CBC
Platelets: 208 10*3/uL (ref 150–400)
RBC: 2.8 MIL/uL — ABNORMAL LOW (ref 4.22–5.81)
WBC: 5.8 10*3/uL (ref 4.0–10.5)

## 2012-11-08 LAB — GLUCOSE, CAPILLARY
Glucose-Capillary: 178 mg/dL — ABNORMAL HIGH (ref 70–99)
Glucose-Capillary: 145 mg/dL — ABNORMAL HIGH (ref 70–99)

## 2012-11-08 LAB — BASIC METABOLIC PANEL
CO2: 33 mEq/L — ABNORMAL HIGH (ref 19–32)
Calcium: 8.2 mg/dL — ABNORMAL LOW (ref 8.4–10.5)
Chloride: 97 mEq/L (ref 96–112)
GFR calc Af Amer: >90 mL/min (ref >90)
Sodium: 138 mEq/L (ref 135–145)

## 2012-11-08 MED ORDER — OXYCODONE HCL 5 MG/5ML PO SOLN
5.0000 mg | Freq: Once | ORAL | Status: AC
  Administered 2012-11-08: 5 mg
  Filled 2012-11-08: qty 5

## 2012-11-08 MED ORDER — BARRIER CREAM NON-SPECIFIED
1.0000 "application " | TOPICAL_CREAM | Freq: Two times a day (BID) | TOPICAL | Status: DC | PRN
  Filled 2012-11-08: qty 1

## 2012-11-08 MED ORDER — ZINC OXIDE 40 % EX OINT
TOPICAL_OINTMENT | Freq: Three times a day (TID) | CUTANEOUS | Status: DC | PRN
  Filled 2012-11-08 (×2): qty 114
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
## 2012-11-08 NOTE — Progress Notes (Signed)
0830  Leakage of  Moderate amount of bileous -like cleared fluid around   Peg area  ,  slightly tender to touch of stomach on palpation .,otherwise pain free. Irritated and reddened skin area . Referred to Dr. Lavera Guise with orders place gastrostomy  port of the peg to low intermittent suction .  For 3 hours  As ordered tolerated by pt well . Obtained about 50 mls of drainage . Off suction thereafter . Dry dressing applied around peg . Continue obsevation . 1300 seen and evaluated by Dr. Lavera Guise . Spoken with pt and family about plans of care

## 2012-11-08 NOTE — Progress Notes (Signed)
Spoken with Dr. Lavera Guise . To resume peg  Feeding  And done .Desitin cream applied to reddened peg areas
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
## 2012-11-09 ENCOUNTER — Inpatient Hospital Stay (HOSPITAL_COMMUNITY): Payer: PRIVATE HEALTH INSURANCE

## 2012-11-09 DIAGNOSIS — C228 Malignant neoplasm of liver, primary, unspecified as to type: Secondary | ICD-10-CM

## 2012-11-09 LAB — GLUCOSE, CAPILLARY
Glucose-Capillary: 245 mg/dL — ABNORMAL HIGH (ref 70–99)
Glucose-Capillary: 196 mg/dL — ABNORMAL HIGH (ref 70–99)
Glucose-Capillary: 202 mg/dL — ABNORMAL HIGH (ref 70–99)

## 2012-11-09 MED ORDER — ASPIRIN 81 MG PO CHEW: 81.0000 mg | CHEWABLE_TABLET | Freq: Every day | ORAL | Status: DC

## 2012-11-09 MED ORDER — IPRATROPIUM BROMIDE 0.02 % IN SOLN: 0.5000 mg | Freq: Four times a day (QID) | RESPIRATORY_TRACT | Status: DC

## 2012-11-09 MED ORDER — LEVOTHYROXINE SODIUM 88 MCG PO TABS
88.0000 ug | ORAL_TABLET | Freq: Every day | ORAL | Status: DC
  Filled 2012-11-09: qty 1

## 2012-11-09 MED ORDER — BIOTENE DRY MOUTH MT LIQD: 15.0000 mL | Freq: Two times a day (BID) | OROMUCOSAL | Status: DC

## 2012-11-09 MED ORDER — BUDESONIDE 0.5 MG/2ML IN SUSP: 0.5000 mg | Freq: Two times a day (BID) | RESPIRATORY_TRACT | Status: DC

## 2012-11-09 MED ORDER — COLLAGENASE 250 UNIT/GM EX OINT: TOPICAL_OINTMENT | Freq: Every day | CUTANEOUS | Status: DC

## 2012-11-09 MED ORDER — OXYCODONE HCL 5 MG/5ML PO SOLN: 5.0000 mg | Freq: Four times a day (QID) | ORAL | Status: DC | PRN

## 2012-11-09 MED ORDER — PANTOPRAZOLE SODIUM 40 MG PO PACK: 40.0000 mg | PACK | Freq: Every day | ORAL | Status: DC

## 2012-11-09 MED ORDER — OSMOLITE 1.5 CAL PO LIQD: 1000.0000 mL | ORAL | Status: DC

## 2012-11-09 MED ORDER — PANTOPRAZOLE SODIUM 40 MG PO PACK
40.0000 mg | PACK | Freq: Every day | ORAL | Status: DC
  Administered 2012-11-09: 40 mg
  Filled 2012-11-09: qty 20

## 2012-11-09 MED ORDER — ZINC OXIDE 40 % EX OINT: TOPICAL_OINTMENT | Freq: Three times a day (TID) | CUTANEOUS | Status: DC | PRN

## 2012-11-09 MED ORDER — FREE WATER: 200.0000 mL | Freq: Four times a day (QID) | Status: DC

## 2012-11-09 MED ORDER — AMOXICILLIN-POT CLAVULANATE 250-62.5 MG/5ML PO SUSR
500.0000 mg | Freq: Two times a day (BID) | ORAL | Status: DC
  Administered 2012-11-09: 500 mg
  Filled 2012-11-09: qty 10

## 2012-11-09 MED ORDER — INSULIN ASPART 100 UNIT/ML ~~LOC~~ SOLN: 0.0000 [IU] | Freq: Four times a day (QID) | SUBCUTANEOUS | Status: DC

## 2012-11-09 MED ORDER — INSULIN ASPART 100 UNIT/ML ~~LOC~~ SOLN
0.0000 [IU] | Freq: Four times a day (QID) | SUBCUTANEOUS | Status: DC
  Administered 2012-11-09: 3 [IU] via SUBCUTANEOUS

## 2012-11-09 MED ORDER — AMOXICILLIN-POT CLAVULANATE 250-62.5 MG/5ML PO SUSR: 500.0000 mg | Freq: Two times a day (BID) | ORAL | Status: DC

## 2012-11-09 MED ORDER — ALBUTEROL SULFATE (5 MG/ML) 0.5% IN NEBU: 2.5000 mg | INHALATION_SOLUTION | Freq: Four times a day (QID) | RESPIRATORY_TRACT | Status: DC

## 2012-11-09 MED ORDER — OXYCODONE HCL 5 MG/5ML PO SOLN
5.0000 mg | Freq: Four times a day (QID) | ORAL | Status: DC | PRN
  Administered 2012-11-09: 5 mg
  Filled 2012-11-09: qty 5

## 2012-11-09 MED ORDER — PRO-STAT SUGAR FREE PO LIQD: 30.0000 mL | Freq: Every day | ORAL | Status: DC

## 2012-11-09 MED ORDER — ACETAMINOPHEN 160 MG/5ML PO SOLN: 325.0000 mg | Freq: Four times a day (QID) | ORAL | Status: DC | PRN

## 2012-11-09 NOTE — Progress Notes (Signed)
Pt continues to have bile colored drainage around the G-tube site. Skin around the area is red and irritated.  Dressing changed. Pt also c/o generalized pain. Tylenol ineffective. MD made aware of the above. New order given for pain med.  Benay Pike

## 2012-11-09 NOTE — Progress Notes (Signed)
Pt being readied to transfer to [facility]. Called report to [facility] transfer team and to hospital RN , Judeth Cornfield,         Pt's family aware of pt's transfer will be in room 2113.
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
## 2012-11-10 DIAGNOSIS — Z980 Intestinal bypass and anastomosis status: Secondary | ICD-10-CM | POA: Insufficient documentation

## 2012-11-10 LAB — CULTURE, BLOOD (ROUTINE X 2): Culture: NO GROWTH

## 2012-11-14 ENCOUNTER — Emergency Department (HOSPITAL_COMMUNITY)
Admission: EM | Admit: 2012-11-14 | Discharge: 2012-11-14 | Disposition: A | Discharge status: Other Acute Inpatient Hospital | Payer: PRIVATE HEALTH INSURANCE | Attending: Emergency Medicine | Admitting: Emergency Medicine

## 2012-11-14 ENCOUNTER — Emergency Department (HOSPITAL_COMMUNITY): Payer: PRIVATE HEALTH INSURANCE

## 2012-11-14 ENCOUNTER — Encounter (HOSPITAL_COMMUNITY): Payer: Self-pay

## 2012-11-14 DIAGNOSIS — I739 Peripheral vascular disease, unspecified: Secondary | ICD-10-CM | POA: Insufficient documentation

## 2012-11-14 DIAGNOSIS — Z8701 Personal history of pneumonia (recurrent): Secondary | ICD-10-CM | POA: Insufficient documentation

## 2012-11-14 DIAGNOSIS — I679 Cerebrovascular disease, unspecified: Secondary | ICD-10-CM | POA: Insufficient documentation

## 2012-11-14 DIAGNOSIS — K9423 Gastrostomy malfunction: Secondary | ICD-10-CM | POA: Insufficient documentation

## 2012-11-14 DIAGNOSIS — E785 Hyperlipidemia, unspecified: Secondary | ICD-10-CM | POA: Insufficient documentation

## 2012-11-14 DIAGNOSIS — I10 Essential (primary) hypertension: Secondary | ICD-10-CM | POA: Insufficient documentation

## 2012-11-14 DIAGNOSIS — Z87891 Personal history of nicotine dependence: Secondary | ICD-10-CM | POA: Insufficient documentation

## 2012-11-14 DIAGNOSIS — E119 Type 2 diabetes mellitus without complications: Secondary | ICD-10-CM | POA: Insufficient documentation

## 2012-11-14 LAB — CBC
HCT: 32.3 % — ABNORMAL LOW (ref 39.0–52.0)
Hemoglobin: 10.5 g/dL — ABNORMAL LOW (ref 13.0–17.0)
MCHC: 32.5 g/dL (ref 30.0–36.0)
WBC: 10.9 10*3/uL — ABNORMAL HIGH (ref 4.0–10.5)

## 2012-11-14 LAB — BASIC METABOLIC PANEL
BUN: 21 mg/dL (ref 6–23)
Chloride: 98 mEq/L (ref 96–112)
Glucose, Bld: 79 mg/dL (ref 70–99)
Potassium: 3.7 mEq/L (ref 3.5–5.1)

## 2012-11-14 NOTE — ED Notes (Signed)
EMS reports patient's family was unable to flush J-tube yesterday around 1800.  Patient has G/J tube and receives tube feeds.  Patient has been treated at [facility] for the same recently.  Patient's last BM was yesterday.

## 2012-11-14 NOTE — ED Notes (Signed)
Report called to [facility] and [employer]

## 2012-11-14 NOTE — ED Notes (Signed)
Pt.'s Daughter Marcelino Duster is [phone]
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
## 2012-11-14 NOTE — ED Notes (Signed)
Home health nurse states that his feeding tube will not flush, pt has been to [facility] in the past and they have sent him to [facility] because they were unable to replace it

## 2012-11-14 NOTE — ED Notes (Signed)
Report called to Writer at [facility]

## 2012-11-14 NOTE — ED Notes (Signed)
Pt now to be transported by PTA, dispatch called for transport.

## 2012-11-14 NOTE — ED Notes (Signed)
Patient transported to X-ray 

## 2012-11-14 NOTE — ED Notes (Signed)
[facility] to return call for transport verification. Report called to Counselling psychologist at [facility] ED

## 2012-11-14 NOTE — ED Notes (Signed)
MD at bedside. 

## 2012-11-25 ENCOUNTER — Emergency Department (HOSPITAL_COMMUNITY): Payer: PRIVATE HEALTH INSURANCE

## 2012-11-25 ENCOUNTER — Emergency Department (HOSPITAL_COMMUNITY)
Admission: EM | Admit: 2012-11-25 | Discharge: 2012-11-25 | Disposition: A | Discharge status: Home / Self Care | Payer: PRIVATE HEALTH INSURANCE | Attending: Emergency Medicine | Admitting: Emergency Medicine

## 2012-11-25 DIAGNOSIS — I679 Cerebrovascular disease, unspecified: Secondary | ICD-10-CM | POA: Insufficient documentation

## 2012-11-25 DIAGNOSIS — Z7982 Long term (current) use of aspirin: Secondary | ICD-10-CM | POA: Insufficient documentation

## 2012-11-25 DIAGNOSIS — K9423 Gastrostomy malfunction: Secondary | ICD-10-CM | POA: Insufficient documentation

## 2012-11-25 DIAGNOSIS — E785 Hyperlipidemia, unspecified: Secondary | ICD-10-CM | POA: Insufficient documentation

## 2012-11-25 DIAGNOSIS — I10 Essential (primary) hypertension: Secondary | ICD-10-CM | POA: Insufficient documentation

## 2012-11-25 DIAGNOSIS — Z79899 Other long term (current) drug therapy: Secondary | ICD-10-CM | POA: Insufficient documentation

## 2012-11-25 DIAGNOSIS — I739 Peripheral vascular disease, unspecified: Secondary | ICD-10-CM | POA: Insufficient documentation

## 2012-11-25 DIAGNOSIS — Z87891 Personal history of nicotine dependence: Secondary | ICD-10-CM | POA: Insufficient documentation

## 2012-11-25 DIAGNOSIS — Z8505 Personal history of malignant neoplasm of liver: Secondary | ICD-10-CM | POA: Insufficient documentation

## 2012-11-25 DIAGNOSIS — E119 Type 2 diabetes mellitus without complications: Secondary | ICD-10-CM | POA: Insufficient documentation

## 2012-11-25 DIAGNOSIS — Z8701 Personal history of pneumonia (recurrent): Secondary | ICD-10-CM | POA: Insufficient documentation

## 2012-11-25 DIAGNOSIS — K9413 Enterostomy malfunction: Secondary | ICD-10-CM

## 2012-11-25 NOTE — Procedures (Signed)
Clearance of occluded GJ tube under fluoroscopy No complication No blood loss. See complete dictation in [facility].

## 2012-11-25 NOTE — ED Notes (Addendum)
Family reports pts "J tube is clogged." reports since 10am yesterday has not been able to get any of his feeding in.

## 2012-11-25 NOTE — ED Provider Notes (Signed)
History     CSN: [id number]  Arrival date & time 11/25/12  1459   First MD Initiated Contact with Patient 11/25/12 1518      Chief Complaint  Patient presents with  . GI Problem    (Consider location/radiation/quality/duration/timing/severity/associated sxs/prior treatment) HPI Comments: JOSHUA SOULIER is a 78 y.o. Male who is here with caregivers, who states that his feeding jejunostomy, stopped working at 2:30 AM. A home health nurse, tried to get it to work. She was unable. The jejunostomy was replaced. Last week, [facility]. Patient has not had fever, chills, nausea, vomiting, weakness, dizziness, or abdominal pain. The patient is unable to give history.    Patient is a 78 y.o. male presenting with GI illness. The history is provided by the patient.  GI Problem     Past Medical History  Diagnosis Date  . DM2 (diabetes mellitus, type 2)   . Hyperlipidemia   . Hypertension   . Cerebral vascular disease   . HCC (hepatocellular carcinoma)   . Blood dyscrasia     THROMBCYTOPATHIA  . Peripheral vascular disease   . Pneumonia 11/03/2012    Past Surgical History  Procedure Date  . Carotid stent 2002  . Liver canc   . Posterior cervical fusion/foraminotomy 09/25/2012    Procedure: POSTERIOR CERVICAL FUSION/FORAMINOTOMY LEVEL 2;  Surgeon: Tia Alert, MD;  Location: [facility]UR[facility];  Service: Neurosurgery;  Laterality: N/A;  Posterior Cervical three-four laminectomy, posterior cervical three-four, four-five fusion  . Esophagogastroduodenoscopy 10/01/2012    Procedure: ESOPHAGOGASTRODUODENOSCOPY (EGD);  Surgeon: Cherylynn Ridges, MD;  Location: [facility] ENDOSCOPY;  Service: General;  Laterality: N/A;  wyatt/leone  . Peg placement 10/01/2012    Procedure: PERCUTANEOUS ENDOSCOPIC GASTROSTOMY (PEG) PLACEMENT;  Surgeon: Cherylynn Ridges, MD;  Location: [facility] ENDOSCOPY;  Service: General;  Laterality: N/A;  . Esophagogastroduodenoscopy 10/31/2012    Procedure: ESOPHAGOGASTRODUODENOSCOPY (EGD);   Surgeon: Theda Belfast, MD;  Location: [facility] ENDOSCOPY;  Service: Endoscopy;  Laterality: N/A;    No family history on file.  History  Substance Use Topics  . Smoking status: Former Smoker -- 1.0 packs/day for 50 years    Types: Cigarettes    Quit date: 01/01/2012  . Smokeless tobacco: Never Used  . Alcohol Use: 1.2 oz/week    2 Cans of beer per week     Comment: daily      Review of Systems  All other systems reviewed and are negative.    Allergies  Review of patient's allergies indicates no known allergies.  Home Medications   Current Outpatient Rx  Name  Route  Sig  Dispense  Refill  . ALBUTEROL SULFATE (5 MG/ML) 0.5% IN NEBU   Nebulization   Take 0.5 mLs (2.5 mg total) by nebulization every 6 (six) hours.   20 mL      . AMOXICILLIN-POT CLAVULANATE 250-62.5 MG/5ML PO SUSR   Per Tube   Place 500 mg into feeding tube every 12 (twelve) hours.         Marland Kitchen BIOTENE DRY MOUTH MT LIQD   Mouth Rinse   15 mLs by Mouth Rinse route 2 (two) times daily.         . ASPIRIN 81 MG PO CHEW   Oral   Chew 1 tablet (81 mg total) by mouth daily.         . ATORVASTATIN CALCIUM 40 MG PO TABS   Oral   Take 40 mg by mouth daily.         Marland Kitchen  BUDESONIDE 0.5 MG/2ML IN SUSP   Nebulization   Take 2 mLs (0.5 mg total) by nebulization 2 (two) times daily.         . COLLAGENASE 250 UNIT/GM EX OINT   Topical   Apply topically daily.   15 g      . PRO-STAT 64 PO LIQD   Per Tube   Place 30 mLs into feeding tube daily.   900 mL      . IPRATROPIUM BROMIDE 0.02 % IN SOLN   Nebulization   Take 2.5 mLs (0.5 mg total) by nebulization every 6 (six) hours.   75 mL      . LEVOTHYROXINE SODIUM 88 MCG PO TABS   Oral   Take 88 mcg by mouth daily.         Marland Kitchen LINAGLIPTIN 5 MG PO TABS   Oral   Take 5 mg by mouth daily.         Marland Kitchen LISINOPRIL 10 MG PO TABS   Oral   Take 20 mg by mouth daily.         Marland Kitchen ZINC OXIDE 40 % EX OINT   Topical   Apply topically 3 (three) times  daily as needed.   56.7 g      . METFORMIN HCL ER (MOD) 1000 MG PO TB24   Oral   Take 1,000 mg by mouth 2 (two) times daily with a meal.         . OSMOLITE 1.5 CAL PO LIQD   Per Tube   Place 1,000 mLs into feeding tube continuous.         . OXYCODONE HCL 5 MG/5ML PO SOLN   Per Tube   Place 5 mg into feeding tube every 6 (six) hours as needed. Pain         . PANTOPRAZOLE 40 MG/20 ML SUSPENSION   Per Tube   Place 20 mLs (40 mg total) into feeding tube daily.   30 each      . FREE WATER   Per Tube   Place 200 mLs into feeding tube every 6 (six) hours.           BP 115/54  Pulse 70  Temp 97.4 F (36.3 C) (Oral)  Resp 15  SpO2 100%  Physical Exam  Nursing note and vitals reviewed. Constitutional: He is oriented to person, place, and time. He appears well-developed.       Frail, cachectic  HENT:  Head: Normocephalic and atraumatic.  Right Ear: External ear normal.  Left Ear: External ear normal.  Eyes: Conjunctivae normal and EOM are normal. Pupils are equal, round, and reactive to light.  Neck: Normal range of motion and phonation normal. Neck supple.  Cardiovascular: Normal rate, regular rhythm, normal heart sounds and intact distal pulses.   Pulmonary/Chest: Effort normal and breath sounds normal. He exhibits no bony tenderness.  Abdominal: Soft. Normal appearance and bowel sounds are normal. There is no tenderness.       Normal appearing jejunostomy tube in the left upper quadrant. Ostomy site normal.  Musculoskeletal: Normal range of motion.  Neurological: He is alert and oriented to person, place, and time. He has normal strength. No cranial nerve deficit or sensory deficit. He exhibits normal muscle tone. Coordination normal.  Skin: Skin is warm, dry and intact.  Psychiatric: He has a normal mood and affect. His behavior is normal. Judgment and thought content normal.    ED Course  Procedures (including critical care time)  Attempts by ER staff to  unclog J-tube unsuccessful.  Patient sent to IR, where Dr. Deanne Coffer, was able to make the J-tube functional.   Labs Reviewed - No data to display Dg Abd 1 View  11/25/2012  *RADIOLOGY REPORT*  Clinical Data: Feeding tube malfunction.  ABDOMEN - 1 VIEW  Comparison: 11/14/2012.  Findings: Gastric tube button remains present over the distal fundus of the stomach.  There is no contrast within the stomach. There is contrast within the jejunal and duodenal aspect of the gastrojejunostomy tube.  Contrast is present in the colon, presumably from prior administration.  Although there is contrast within the gastrojejunostomy tube, there is no filling of the jejunum.  There is a portion of the proximal unopacified gastrojejunostomy tube between the balloon in the stomach and duodenal portion.  More contrast could be injected to further assess the patency of the tube.  IMPRESSION: Gastrojejunostomy tube is present however there is no filling of the jejunum.  More contrast can be injected to assess patency and function of the tube.   Original Report Authenticated By: Andreas Newport, M.D.      Nursing notes, applicable records and vitals reviewed.  Radiologic Images/Reports reviewed.   Nursing notes, applicable records and vitals reviewed.  Radiologic Images/Reports reviewed.   1. Jejunostomy malfunction       MDM  J-tube malfunction, corrected. It was likely secondary to inadequate medication , crushing.  Family instructed on appropriate pulverizing mechanism        Flint Melter, MD 11/25/12 [phone]

## 2012-11-25 NOTE — ED Notes (Signed)
Patient transported to X-ray 

## 2012-11-26 ENCOUNTER — Encounter
Payer: No Typology Code available for payment source | Attending: Physical Medicine & Rehabilitation | Admitting: Physical Medicine & Rehabilitation

## 2012-11-28 ENCOUNTER — Encounter (HOSPITAL_COMMUNITY): Payer: Self-pay | Admitting: Emergency Medicine

## 2012-12-01 ENCOUNTER — Other Ambulatory Visit (HOSPITAL_COMMUNITY): Payer: Self-pay | Admitting: Interventional Radiology

## 2012-12-01 ENCOUNTER — Inpatient Hospital Stay: Payer: PRIVATE HEALTH INSURANCE | Admitting: Critical Care Medicine

## 2012-12-01 ENCOUNTER — Ambulatory Visit (HOSPITAL_COMMUNITY)
Admission: RE | Admit: 2012-12-01 | Discharge: 2012-12-01 | Disposition: A | Discharge status: Home / Self Care | Payer: PRIVATE HEALTH INSURANCE | Source: Ambulatory Visit | Attending: Interventional Radiology | Admitting: Interventional Radiology

## 2012-12-01 DIAGNOSIS — K9429 Other complications of gastrostomy: Secondary | ICD-10-CM | POA: Insufficient documentation

## 2012-12-01 DIAGNOSIS — Z431 Encounter for attention to gastrostomy: Secondary | ICD-10-CM | POA: Insufficient documentation

## 2012-12-01 DIAGNOSIS — R633 Feeding difficulties: Secondary | ICD-10-CM

## 2012-12-01 DIAGNOSIS — Y832 Surgical operation with anastomosis, bypass or graft as the cause of abnormal reaction of the patient, or of later complication, without mention of misadventure at the time of the procedure: Secondary | ICD-10-CM | POA: Insufficient documentation

## 2012-12-01 MED ORDER — IOHEXOL 300 MG/ML  SOLN: 10.0000 mL | Freq: Once | INTRAMUSCULAR | Status: AC | PRN

## 2012-12-01 NOTE — Progress Notes (Signed)
[name] called stating there is a whole in the feeding tube.  I explained to her that per the conversation we had with patient when he was here on the 26th , the catheter had only recently been placed at a new site the week before and could not be exchanged  This was explained to the patient by Dr Deanne Coffer last week on the 26th.  [name] verified that it was a new site placement performed at [facility].  She agreed to bring the patient for [place] to assess and see if we can repair the tube to enable them to use it.

## 2012-12-05 ENCOUNTER — Ambulatory Visit (INDEPENDENT_AMBULATORY_CARE_PROVIDER_SITE_OTHER): Payer: PRIVATE HEALTH INSURANCE | Admitting: Critical Care Medicine

## 2012-12-05 ENCOUNTER — Telehealth: Payer: Self-pay | Admitting: Critical Care Medicine

## 2012-12-05 ENCOUNTER — Encounter: Payer: Self-pay | Admitting: Critical Care Medicine

## 2012-12-05 ENCOUNTER — Ambulatory Visit (INDEPENDENT_AMBULATORY_CARE_PROVIDER_SITE_OTHER)
Admission: RE | Admit: 2012-12-05 | Discharge: 2012-12-05 | Disposition: A | Discharge status: Home / Self Care | Payer: PRIVATE HEALTH INSURANCE | Source: Ambulatory Visit | Attending: Critical Care Medicine | Admitting: Critical Care Medicine

## 2012-12-05 VITALS — BP 90/50 | HR 77 | Temp 97.4°F | Ht 69.0 in | Wt 116.4 lb

## 2012-12-05 DIAGNOSIS — Y95 Nosocomial condition: Secondary | ICD-10-CM

## 2012-12-05 DIAGNOSIS — J690 Pneumonitis due to inhalation of food and vomit: Secondary | ICD-10-CM

## 2012-12-05 DIAGNOSIS — R1310 Dysphagia, unspecified: Secondary | ICD-10-CM

## 2012-12-05 DIAGNOSIS — J189 Pneumonia, unspecified organism: Secondary | ICD-10-CM

## 2012-12-05 DIAGNOSIS — J4489 Other specified chronic obstructive pulmonary disease: Secondary | ICD-10-CM

## 2012-12-05 DIAGNOSIS — J449 Chronic obstructive pulmonary disease, unspecified: Secondary | ICD-10-CM

## 2012-12-05 MED ORDER — BUDESONIDE 0.5 MG/2ML IN SUSP: 0.5000 mg | Freq: Every day | RESPIRATORY_TRACT | Status: DC

## 2012-12-05 MED ORDER — ALBUTEROL SULFATE (5 MG/ML) 0.5% IN NEBU: 2.5000 mg | INHALATION_SOLUTION | Freq: Four times a day (QID) | RESPIRATORY_TRACT | Status: DC

## 2012-12-05 MED ORDER — IPRATROPIUM BROMIDE 0.02 % IN SOLN: 0.5000 mg | Freq: Four times a day (QID) | RESPIRATORY_TRACT | Status: DC

## 2012-12-05 MED ORDER — PANTOPRAZOLE SODIUM 40 MG PO PACK: 40.0000 mg | PACK | Freq: Every day | ORAL | Status: DC

## 2012-12-05 NOTE — Patient Instructions (Addendum)
Stop Nexium Start Protonix 40mg  daily by tube Start atrovent/albuterol 4 times daily Start budesonide .5mg  daily Chest xray today Return 1 month

## 2012-12-05 NOTE — Progress Notes (Signed)
Quick Note:  Called, spoke with pt. Informed him of cxr results and recs per Dr. Wright. He verbalized understanding and voiced no further questions or concerns at this time. ______ 

## 2012-12-05 NOTE — Progress Notes (Signed)
Quick Note:  Notify the patient that the Xray is stable and no pneumonia. All aspiration pneumonia has resolved/cleared out. No change in medications are recommended. Continue current meds as prescribed at last office visit ______

## 2012-12-05 NOTE — Telephone Encounter (Signed)
[facility] does not carry Budesonide 0.5 mg. They only have 0.25 mg. Pls advise on dose change.
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
## 2012-12-07 DIAGNOSIS — J4489 Other specified chronic obstructive pulmonary disease: Secondary | ICD-10-CM | POA: Insufficient documentation

## 2012-12-07 DIAGNOSIS — J449 Chronic obstructive pulmonary disease, unspecified: Secondary | ICD-10-CM | POA: Insufficient documentation

## 2012-12-07 NOTE — Assessment & Plan Note (Signed)
Hx of recurrent aspiration PNA now resolved on current CXR Plan No further ABX needed

## 2012-12-07 NOTE — Assessment & Plan Note (Signed)
Chronic dysphagia. Speech at [facility] recommend npo status but girlfriend giving pt PO diet. She does not seem to understand recommendations given  Jtube only being used for meds.  Nexium givne via jtube clogging tube  Plan Change oprotonix solution  Needs to be npo

## 2012-12-07 NOTE — Assessment & Plan Note (Signed)
Gold C Copd stable at this time  Note CXR clear  Plan Maintain BD neb meds.  Provide neb med albuterol/atrovent/ budesonide Stop Nexium Start Protonix 40mg  daily by tube Return 1 month

## 2012-12-07 NOTE — Telephone Encounter (Signed)
Change to 0.25mg  budesonide

## 2012-12-08 ENCOUNTER — Telehealth: Payer: Self-pay | Admitting: *Deleted

## 2012-12-08 MED ORDER — BUDESONIDE 0.5 MG/2ML IN SUSP: 0.2500 mg | Freq: Every day | RESPIRATORY_TRACT | Status: DC

## 2012-12-08 NOTE — Telephone Encounter (Signed)
Call her back and tell her according to the [facility] d/c summary the speech pathology rec was NPO, if they chose to ignore the rec, then he is at HIGH risk for another aspiration pneumonia

## 2012-12-08 NOTE — Telephone Encounter (Signed)
Called pt at [phone] -- went directly to VM stating the mailbox is not accepting msgs bc VM has not yet been activated.  WCB

## 2012-12-08 NOTE — Telephone Encounter (Signed)
Message copied by Valentino Hue on Mon Dec 08, 2012  8:53 AM ------      Message from: Storm Frisk      Created: Sun Dec 07, 2012  7:11 PM       Call the pt and find out if he is eating.            According to [facility] and [facility] records he is to be npo and be on TF             They were unclear on this             pw

## 2012-12-08 NOTE — Telephone Encounter (Signed)
noted 

## 2012-12-08 NOTE — Telephone Encounter (Signed)
Called, spoke with pt's significant other, [name].  States pt is eating soft foods along using the TF.  Taking meds via tube.  Per [name], pt was told by [facility] that he could go ahead and eat.  States pt has been eating since he was d/c'd from [place].  Will route to PW as FYI.

## 2012-12-08 NOTE — Telephone Encounter (Signed)
Spoke with Morrie Sheldon with [facility] pharmacy . rx for  Budesonide 0.25 mg called in Nothing further needed.

## 2012-12-08 NOTE — Telephone Encounter (Signed)
I atc pt at same # -- went directly to msg that states "this mailbox is unable to accept vm msgs at this time.  Pls try your call again later.  Goodbye."  [facility]

## 2012-12-08 NOTE — Telephone Encounter (Signed)
Called, spoke with pt.  I did clarify below per [facility] with pt.  Pt states this is correct.  I informed pt of below per Dr. Delford Field -- per [facility] d/c summary the speech pathology rec he be NPO, and I explained to him the importance of being NPO d/t being at HIGH risk for another aspiration pna.  He verbalized understanding of this and would like further insight on approx how long he could possibly need to remain NPO.  Advised I would route msg to PW to advise.  Pt verbalized understanding and also requesting I inform [name] of this situation.  I spoke with [name].  I explained to [name] that according to the [facility] d/c summary the speech pathology rec pt was to be NPO.  Advised if pt chooses not to follow these recs, he is at HIGH risk for another aspiration pna.  [name] verbalized understanding of this but was confused at to why [facility] told them "he could eat whatever he wanted to."  I explained that, per the d/c summary from [facility], it states speech path rec he be NPO.  She states pt is now eating and unsure if he will stop.  I explained to [name] that we could not make pt follow recs but we could advise him of the risks if he does not and advised I have already explained this to pt who verbalized complete understanding.  [name] then verbalized understanding.  Dr. Delford Field, pls advise on pt's concerns.  Thank you.

## 2012-12-10 ENCOUNTER — Telehealth: Payer: Self-pay | Admitting: Critical Care Medicine

## 2012-12-10 NOTE — Telephone Encounter (Signed)
Called and spoke with [facility] , pt's home nurse from [facility].  Clarified to her that per Dr Delford Field , pt should be npo according to report from Speech pathology from [facility] and that if pt still chooses to eat then he will be at high risk for aspiration pneumonia.  She verbalized understanding and will clarify this with pt.

## 2012-12-23 ENCOUNTER — Ambulatory Visit (HOSPITAL_COMMUNITY)
Admission: RE | Admit: 2012-12-23 | Discharge: 2012-12-23 | Disposition: A | Discharge status: Home / Self Care | Payer: PRIVATE HEALTH INSURANCE | Source: Ambulatory Visit | Attending: Interventional Radiology | Admitting: Interventional Radiology

## 2012-12-23 ENCOUNTER — Other Ambulatory Visit (HOSPITAL_COMMUNITY): Payer: Self-pay | Admitting: Interventional Radiology

## 2012-12-23 DIAGNOSIS — K9413 Enterostomy malfunction: Secondary | ICD-10-CM | POA: Insufficient documentation

## 2012-12-23 DIAGNOSIS — Z931 Gastrostomy status: Secondary | ICD-10-CM

## 2012-12-23 DIAGNOSIS — K9403 Colostomy malfunction: Secondary | ICD-10-CM | POA: Insufficient documentation

## 2012-12-23 MED ORDER — LIDOCAINE HCL 1 % IJ SOLN
INTRAMUSCULAR | Status: AC
  Filled 2012-12-23: qty 20

## 2012-12-23 NOTE — Procedures (Signed)
GJ tube exchange 20 Fr

## 2012-12-24 ENCOUNTER — Encounter (HOSPITAL_COMMUNITY): Payer: Self-pay | Admitting: *Deleted

## 2012-12-24 ENCOUNTER — Emergency Department (HOSPITAL_COMMUNITY)
Admission: EM | Admit: 2012-12-24 | Discharge: 2012-12-24 | Disposition: A | Discharge status: Home / Self Care | Payer: PRIVATE HEALTH INSURANCE | Attending: Emergency Medicine | Admitting: Emergency Medicine

## 2012-12-24 ENCOUNTER — Emergency Department (HOSPITAL_COMMUNITY): Payer: PRIVATE HEALTH INSURANCE

## 2012-12-24 DIAGNOSIS — Z79899 Other long term (current) drug therapy: Secondary | ICD-10-CM | POA: Insufficient documentation

## 2012-12-24 DIAGNOSIS — K9420 Gastrostomy complication, unspecified: Secondary | ICD-10-CM

## 2012-12-24 DIAGNOSIS — I739 Peripheral vascular disease, unspecified: Secondary | ICD-10-CM | POA: Insufficient documentation

## 2012-12-24 DIAGNOSIS — Z87891 Personal history of nicotine dependence: Secondary | ICD-10-CM | POA: Insufficient documentation

## 2012-12-24 DIAGNOSIS — Z8701 Personal history of pneumonia (recurrent): Secondary | ICD-10-CM | POA: Insufficient documentation

## 2012-12-24 DIAGNOSIS — I10 Essential (primary) hypertension: Secondary | ICD-10-CM | POA: Insufficient documentation

## 2012-12-24 DIAGNOSIS — K9429 Other complications of gastrostomy: Secondary | ICD-10-CM | POA: Insufficient documentation

## 2012-12-24 DIAGNOSIS — E785 Hyperlipidemia, unspecified: Secondary | ICD-10-CM | POA: Insufficient documentation

## 2012-12-24 DIAGNOSIS — Z7982 Long term (current) use of aspirin: Secondary | ICD-10-CM | POA: Insufficient documentation

## 2012-12-24 DIAGNOSIS — Z862 Personal history of diseases of the blood and blood-forming organs and certain disorders involving the immune mechanism: Secondary | ICD-10-CM | POA: Insufficient documentation

## 2012-12-24 DIAGNOSIS — C221 Intrahepatic bile duct carcinoma: Secondary | ICD-10-CM | POA: Insufficient documentation

## 2012-12-24 DIAGNOSIS — Z8673 Personal history of transient ischemic attack (TIA), and cerebral infarction without residual deficits: Secondary | ICD-10-CM | POA: Insufficient documentation

## 2012-12-24 DIAGNOSIS — E119 Type 2 diabetes mellitus without complications: Secondary | ICD-10-CM | POA: Insufficient documentation

## 2012-12-24 MED ORDER — IOHEXOL 300 MG/ML  SOLN
50.0000 mL | Freq: Once | INTRAMUSCULAR | Status: AC | PRN
  Administered 2012-12-24: 50 mL

## 2012-12-24 NOTE — ED Notes (Signed)
md at bedside  Pt alert and oriented x4. Respirations even and unlabored, bilateral symmetrical rise and fall of chest. Skin warm and dry. In no acute distress. Denies needs.   
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
## 2012-12-24 NOTE — ED Notes (Signed)
Pt escorted to discharge window. Pt verbalized understanding discharge instructions. In no acute distress.  

## 2012-12-24 NOTE — ED Notes (Signed)
md at bedside

## 2012-12-24 NOTE — ED Notes (Signed)
Pt back from x-ray.

## 2012-12-24 NOTE — ED Notes (Signed)
Pt to xray

## 2012-12-24 NOTE — ED Provider Notes (Signed)
History     CSN: [id number]  Arrival date & time 12/24/12  0725   First MD Initiated Contact with Patient 12/24/12 [phone]      No chief complaint on file.   (Consider location/radiation/quality/duration/timing/severity/associated sxs/prior treatment) The history is provided by the patient.   patient presents with possible displacement of his GJ tube. It was replaced yesterday by interventional radiology. Caregiver states that it is out further than before and he has had high residuals and some leakage around the tube. No pain. No fevers. No abdominal pain.  Past Medical History  Diagnosis Date  . DM2 (diabetes mellitus, type 2)   . Hyperlipidemia   . Hypertension   . Cerebral vascular disease   . HCC (hepatocellular carcinoma)   . Blood dyscrasia     THROMBCYTOPATHIA  . Peripheral vascular disease   . Pneumonia 11/03/2012    Past Surgical History  Procedure Date  . Carotid stent 2002  . Liver canc   . Posterior cervical fusion/foraminotomy 09/25/2012    Procedure: POSTERIOR CERVICAL FUSION/FORAMINOTOMY LEVEL 2;  Surgeon: Tia Alert, MD;  Location: [facility];  Service: Neurosurgery;  Laterality: N/A;  Posterior Cervical three-four laminectomy, posterior cervical three-four, four-five fusion  . Esophagogastroduodenoscopy 10/01/2012    Procedure: ESOPHAGOGASTRODUODENOSCOPY (EGD);  Surgeon: Cherylynn Ridges, MD;  Location: [facility] ENDOSCOPY;  Service: General;  Laterality: N/A;  wyatt/leone  . Peg placement 10/01/2012    Procedure: PERCUTANEOUS ENDOSCOPIC GASTROSTOMY (PEG) PLACEMENT;  Surgeon: Cherylynn Ridges, MD;  Location: [facility] ENDOSCOPY;  Service: General;  Laterality: N/A;  . Esophagogastroduodenoscopy 10/31/2012    Procedure: ESOPHAGOGASTRODUODENOSCOPY (EGD);  Surgeon: Theda Belfast, MD;  Location: [facility] ENDOSCOPY;  Service: Endoscopy;  Laterality: N/A;    History reviewed. No pertinent family history.  History  Substance Use Topics  . Smoking status: Former Smoker -- 1.0 packs/day  for 50 years    Types: Cigarettes    Quit date: 01/01/2012  . Smokeless tobacco: Never Used  . Alcohol Use: 1.2 oz/week    2 Cans of beer per week     Comment: daily      Review of Systems  Constitutional: Negative for fever, appetite change and fatigue.  Gastrointestinal: Negative for nausea, vomiting and abdominal pain.  Musculoskeletal: Negative for back pain.    Allergies  Review of patient's allergies indicates no known allergies.  Home Medications   Current Outpatient Rx  Name  Route  Sig  Dispense  Refill  . ALBUTEROL SULFATE (5 MG/ML) 0.5% IN NEBU   Nebulization   Take 0.5 mLs (2.5 mg total) by nebulization every 6 (six) hours.   260 mL   6   . BIOTENE DRY MOUTH MT LIQD   Mouth Rinse   15 mLs by Mouth Rinse route 2 (two) times daily.         . ASPIRIN 81 MG PO CHEW   Tube   Give 81 mg by tube.         . ATORVASTATIN CALCIUM 40 MG PO TABS   Tube   Give 40 mg by tube daily.          . COLLAGENASE 250 UNIT/GM EX OINT   Topical   Apply topically daily.   15 g      . FLUTICASONE-SALMETEROL 250-50 MCG/DOSE IN AEPB   Inhalation   Inhale 1 puff into the lungs every 12 (twelve) hours.         . IPRATROPIUM BROMIDE 0.02 % IN SOLN   Nebulization  Take 2.5 mLs (0.5 mg total) by nebulization every 6 (six) hours.   260 mL   6   . LEVOTHYROXINE SODIUM 88 MCG PO TABS   Tube   Give 88 mcg by tube daily.          Marland Kitchen LINAGLIPTIN 5 MG PO TABS   Tube   Give 5 mg by tube daily.          Marland Kitchen ZINC OXIDE 40 % EX OINT   Topical   Apply topically 3 (three) times daily as needed.   56.7 g      . OSMOLITE 1.5 CAL PO LIQD   Per Tube   Place 1,000 mLs into feeding tube continuous.         . OXYCODONE HCL 5 MG/5ML PO SOLN   Per Tube   Place 5 mg into feeding tube every 6 (six) hours as needed. Pain         . FREE WATER   Per Tube   Place 60 mLs into feeding tube every 4 (four) hours.         Marland Kitchen METFORMIN HCL ER (MOD) 1000 MG PO TB24   Tube    Give 1,000 mg by tube 2 (two) times daily with a meal.            BP 113/55  Pulse 76  Temp 97.5 F (36.4 C) (Oral)  Resp 16  SpO2 97%  Physical Exam  Constitutional: He appears well-developed and well-nourished.  Abdominal: There is no tenderness.       GJ tube in abdomen. There is approximately 2 inches of plate and the tube. No tenderness. No drainage around.    ED Course  Procedures (including critical care time)  Labs Reviewed - No data to display Dg Abd 1 View  12/24/2012  *RADIOLOGY REPORT*  Clinical Data: Tube position  ABDOMEN - 1 VIEW  Comparison: Tube check study 12/23/2012  Findings: There is contrast in the right colon, presumably from yesterday's injection.  Contrast opacifies a portion of the expected region of the gastric cardia and jejunum.  Nonobstructive bowel gas pattern.  Right upper quadrant clips are in place.  IMPRESSION: Opacification of jejunum and stomach; images obtained statically cannot be used with certainty to confirm appropriate tube position. If there is persistent concern for tube position despite yesterday's exam, real time fluoroscopic examination could be performed.   Original Report Authenticated By: Christiana Pellant, M.D.      1. Complication of gastrostomy tube       MDM  Patient is concerned that his GJ tube is noted. It had high residuals and some leaking. He was replaced yesterday. X-ray shows likely placement in the jejunum and stomach. There is no tenderness I have no reason to think this is misplaced at this time. He'll be discharged home. No obstruction on x-ray        Juliet Rude. Rubin Payor, MD 12/24/12 [phone]

## 2013-01-09 IMAGING — CR DG CHEST 2V
3 series · 3 of 3 positions shown · non-contrast
Comparison: Chest x-ray 10/27/2012.

CLINICAL DATA: Cough. Pneumonia..

CHEST - 2 VIEW

[x chest ap (1 of 2)]
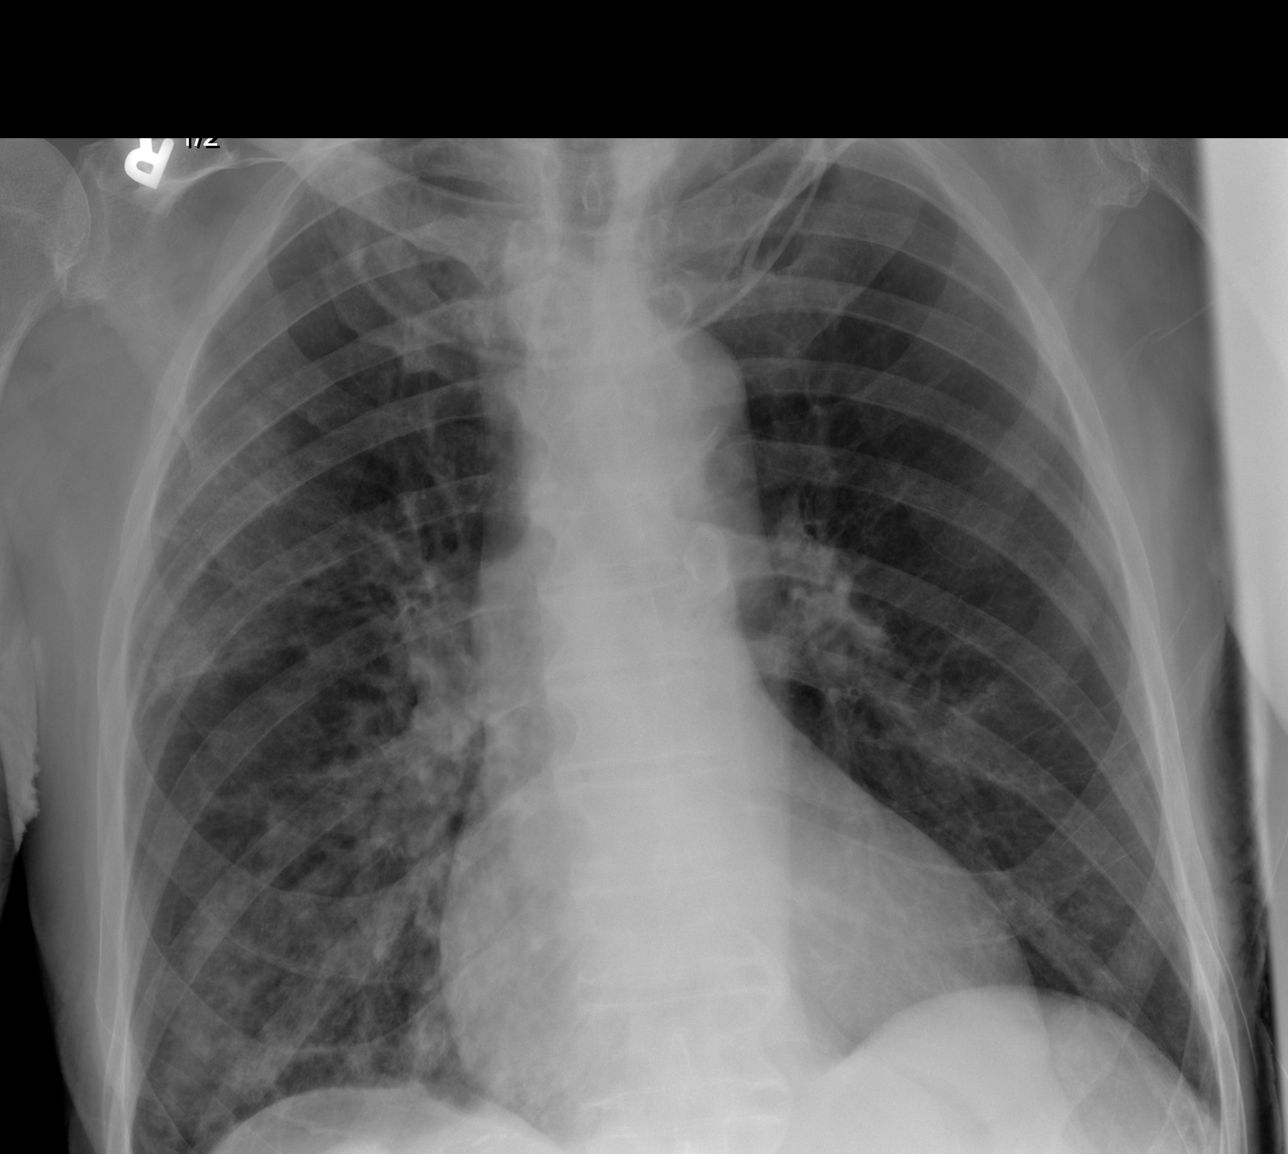

[x chest ap (2 of 2)]
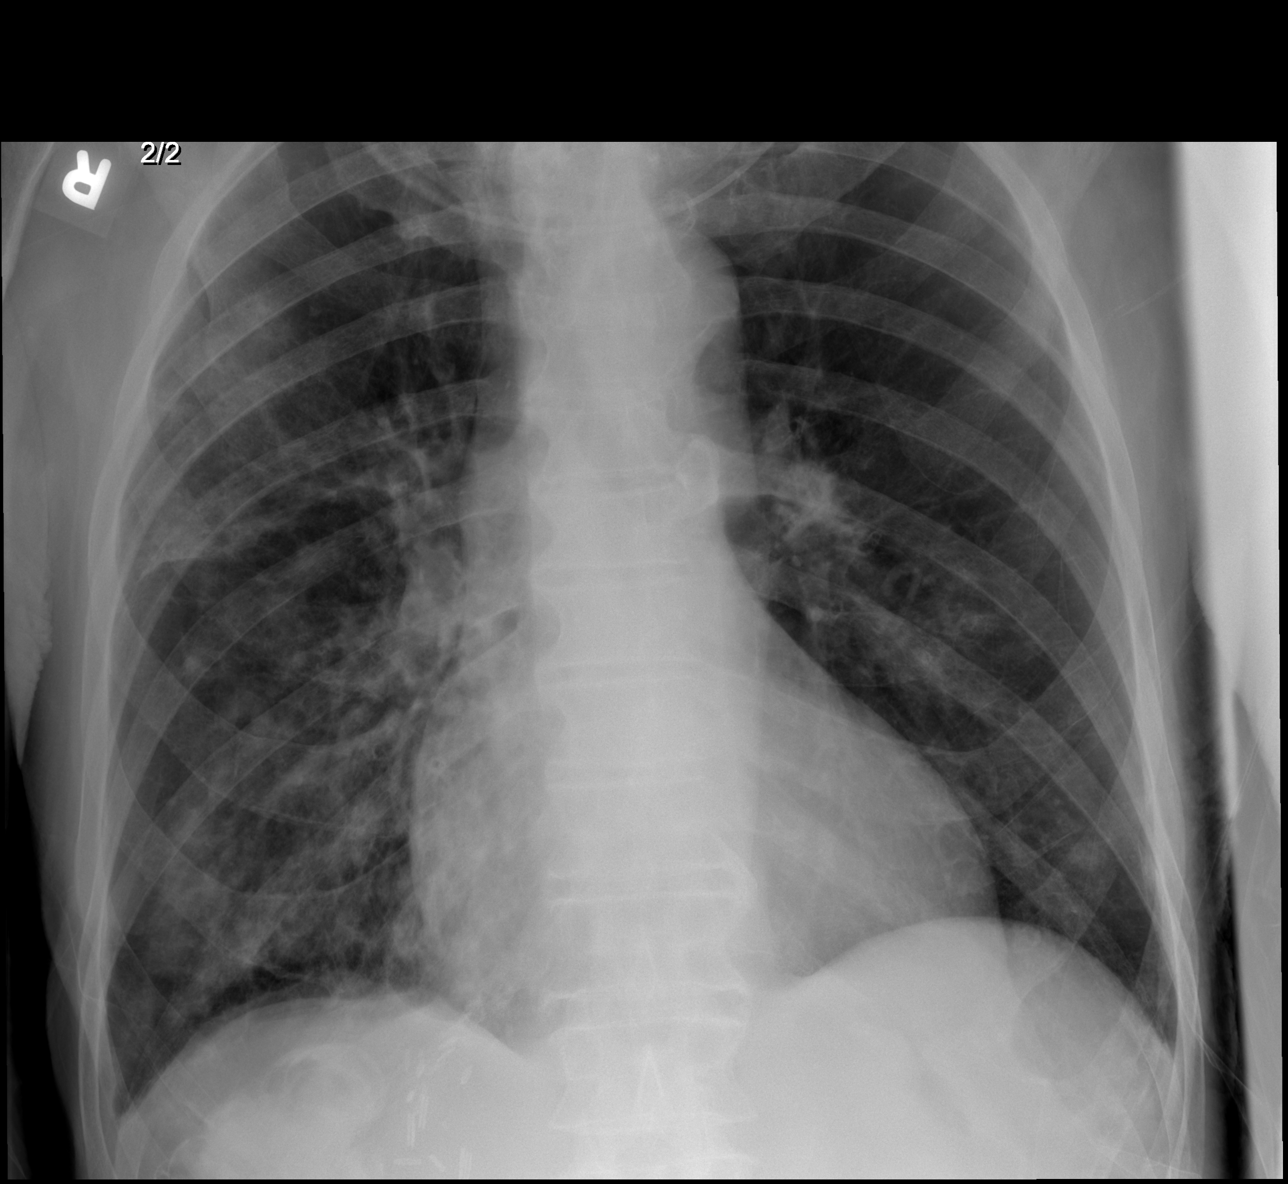

[w chest lat]
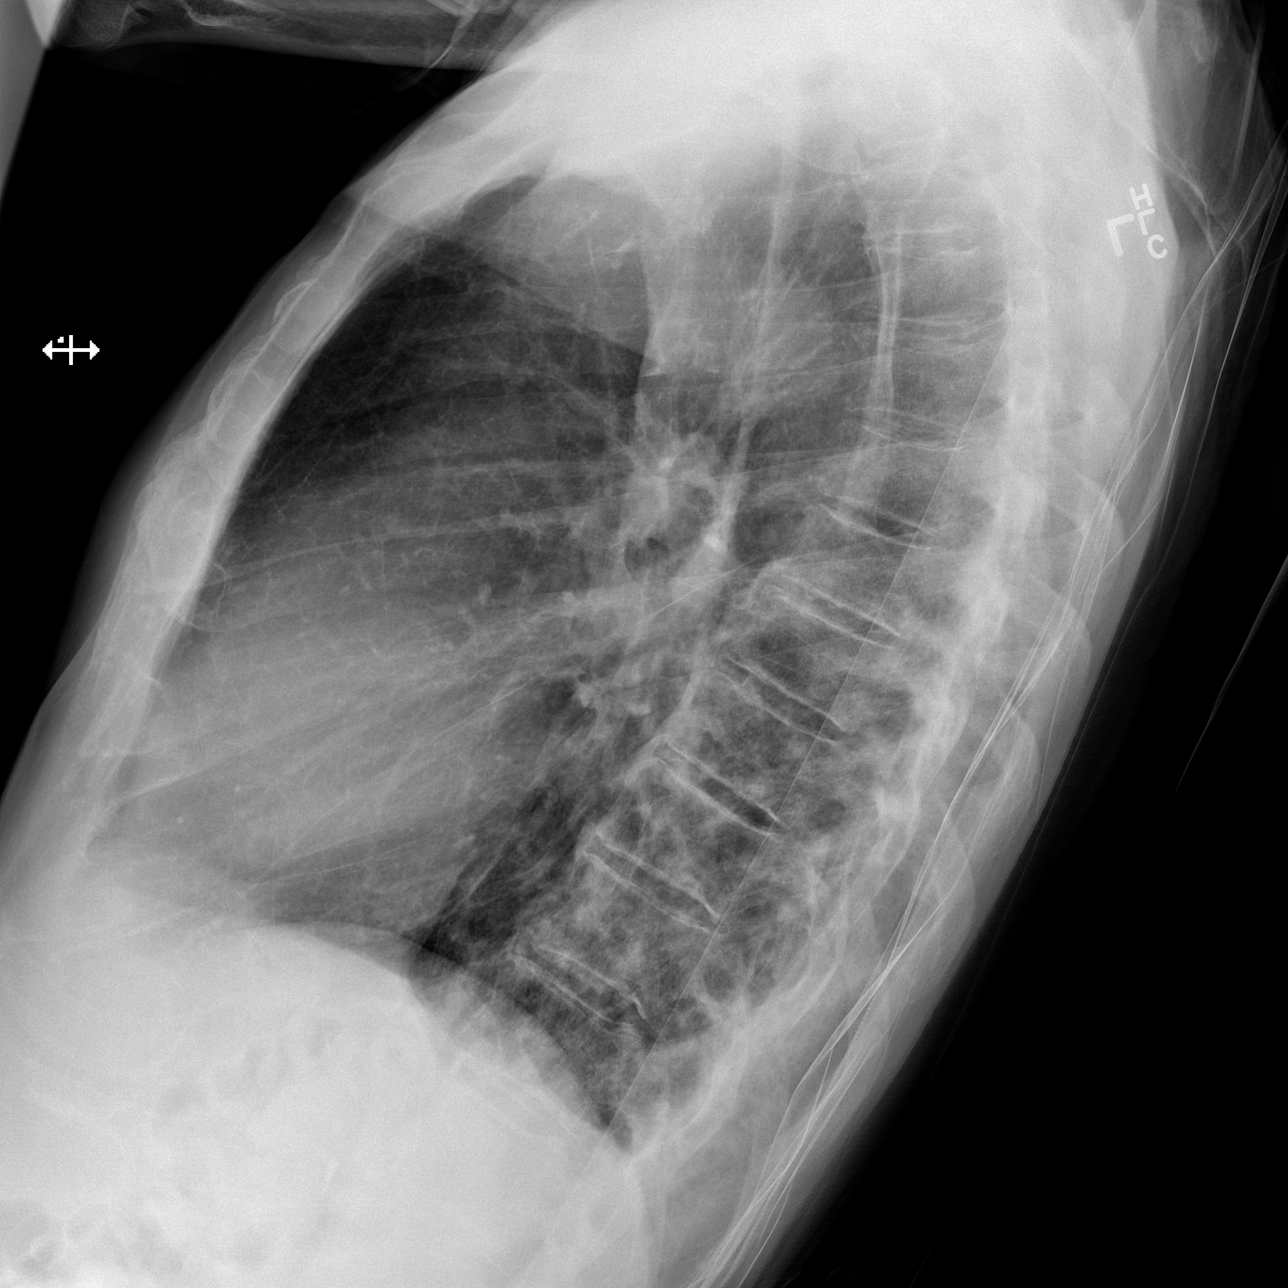

[3 of 3 positions shown; findings below may reference images not displayed]

FINDINGS: Lung volumes are normal.  Extensive patchy interstitial
and airspace disease is noted throughout the right mid and lower
lung.  To a lesser extent, similar findings are also noted in the
contralateral lung.  Trace bilateral pleural effusions.  Pulmonary
vasculature is normal.  Heart size is borderline enlarged.
Mediastinal contours are unremarkable.  Atherosclerosis in the
thoracic aorta.  Previously noted right upper extremity PICC has
been removed. Multiple surgical clips project over the right upper
quadrant of the abdomen.
IMPRESSION: 1.  Findings remain concerning for multilobar pneumonia,
predominantly in the right lower lobe, but with some evidence of
early endobronchial spread to the left lower lobe as well.

## 2013-01-11 IMAGING — CR DG CHEST 1V
2 series · 2 of 2 positions shown · non-contrast
Comparison: 11/02/2012

CLINICAL DATA: Pain, pulmonary infiltrates

CHEST - 1 VIEW

[AP (1 of 2)]
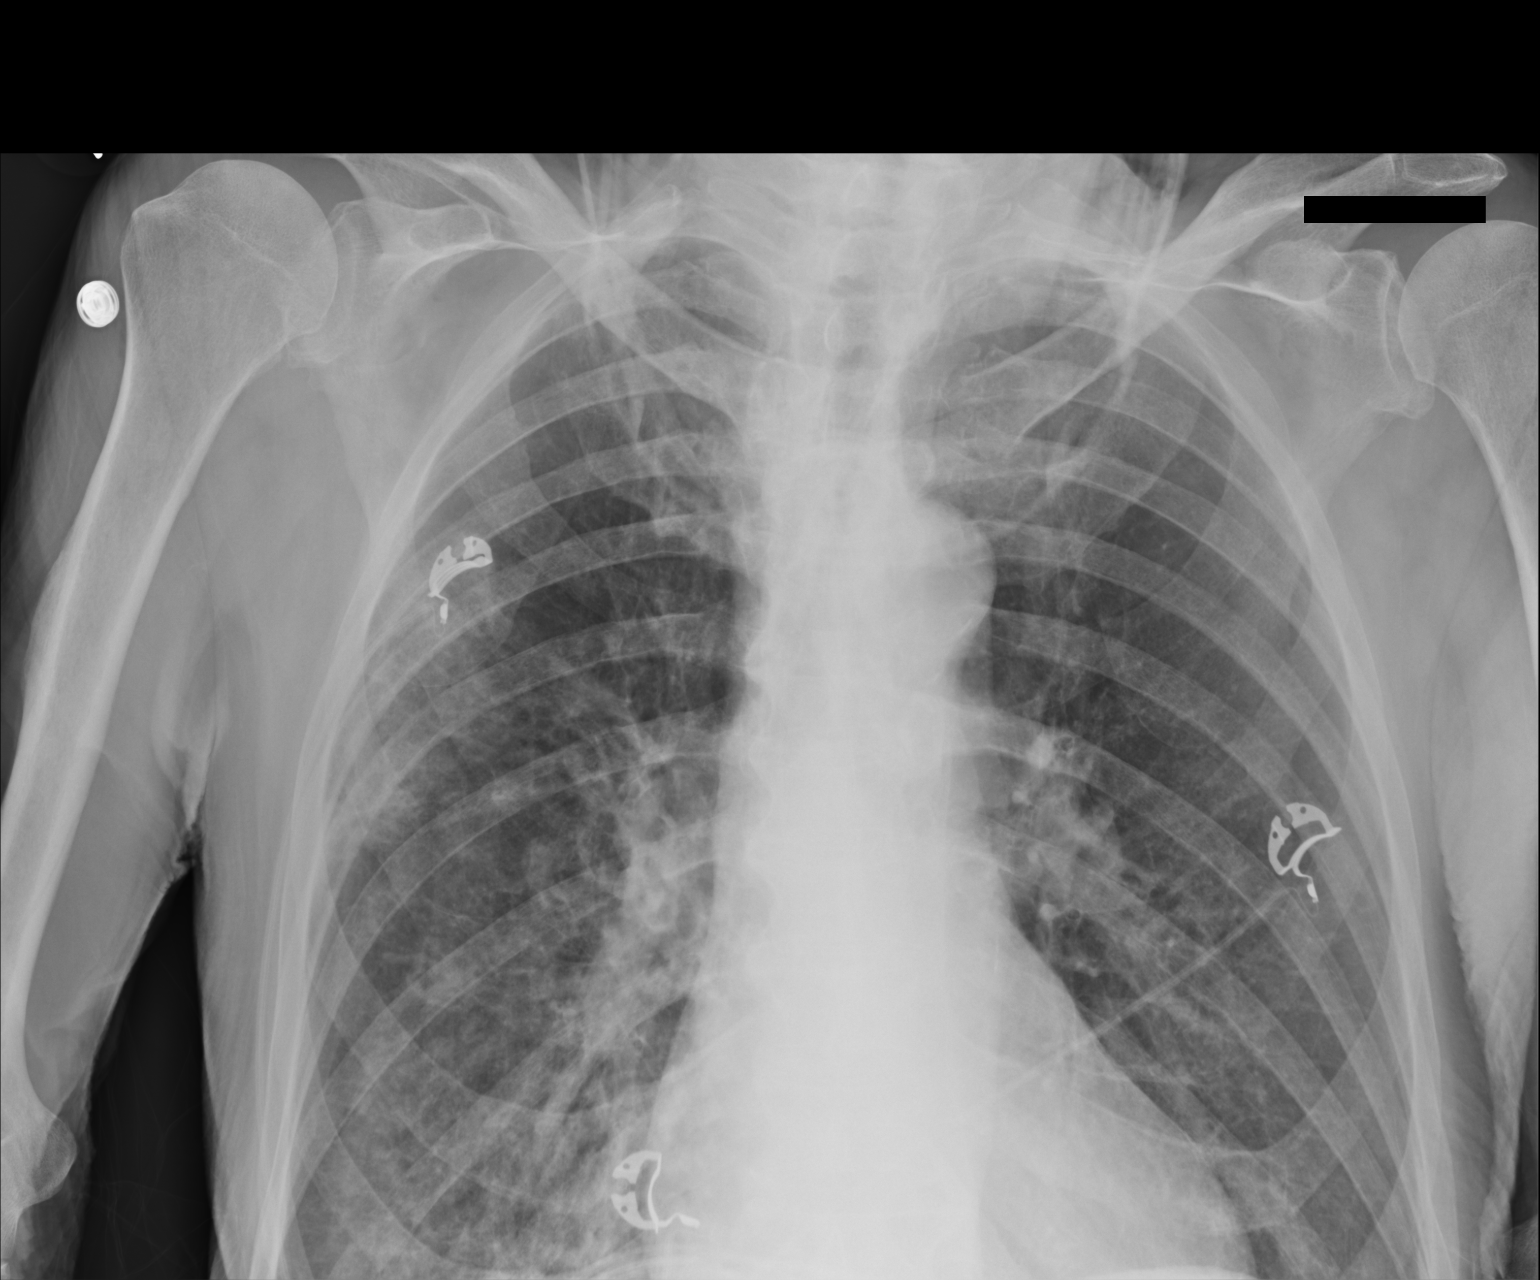

[AP (2 of 2)]
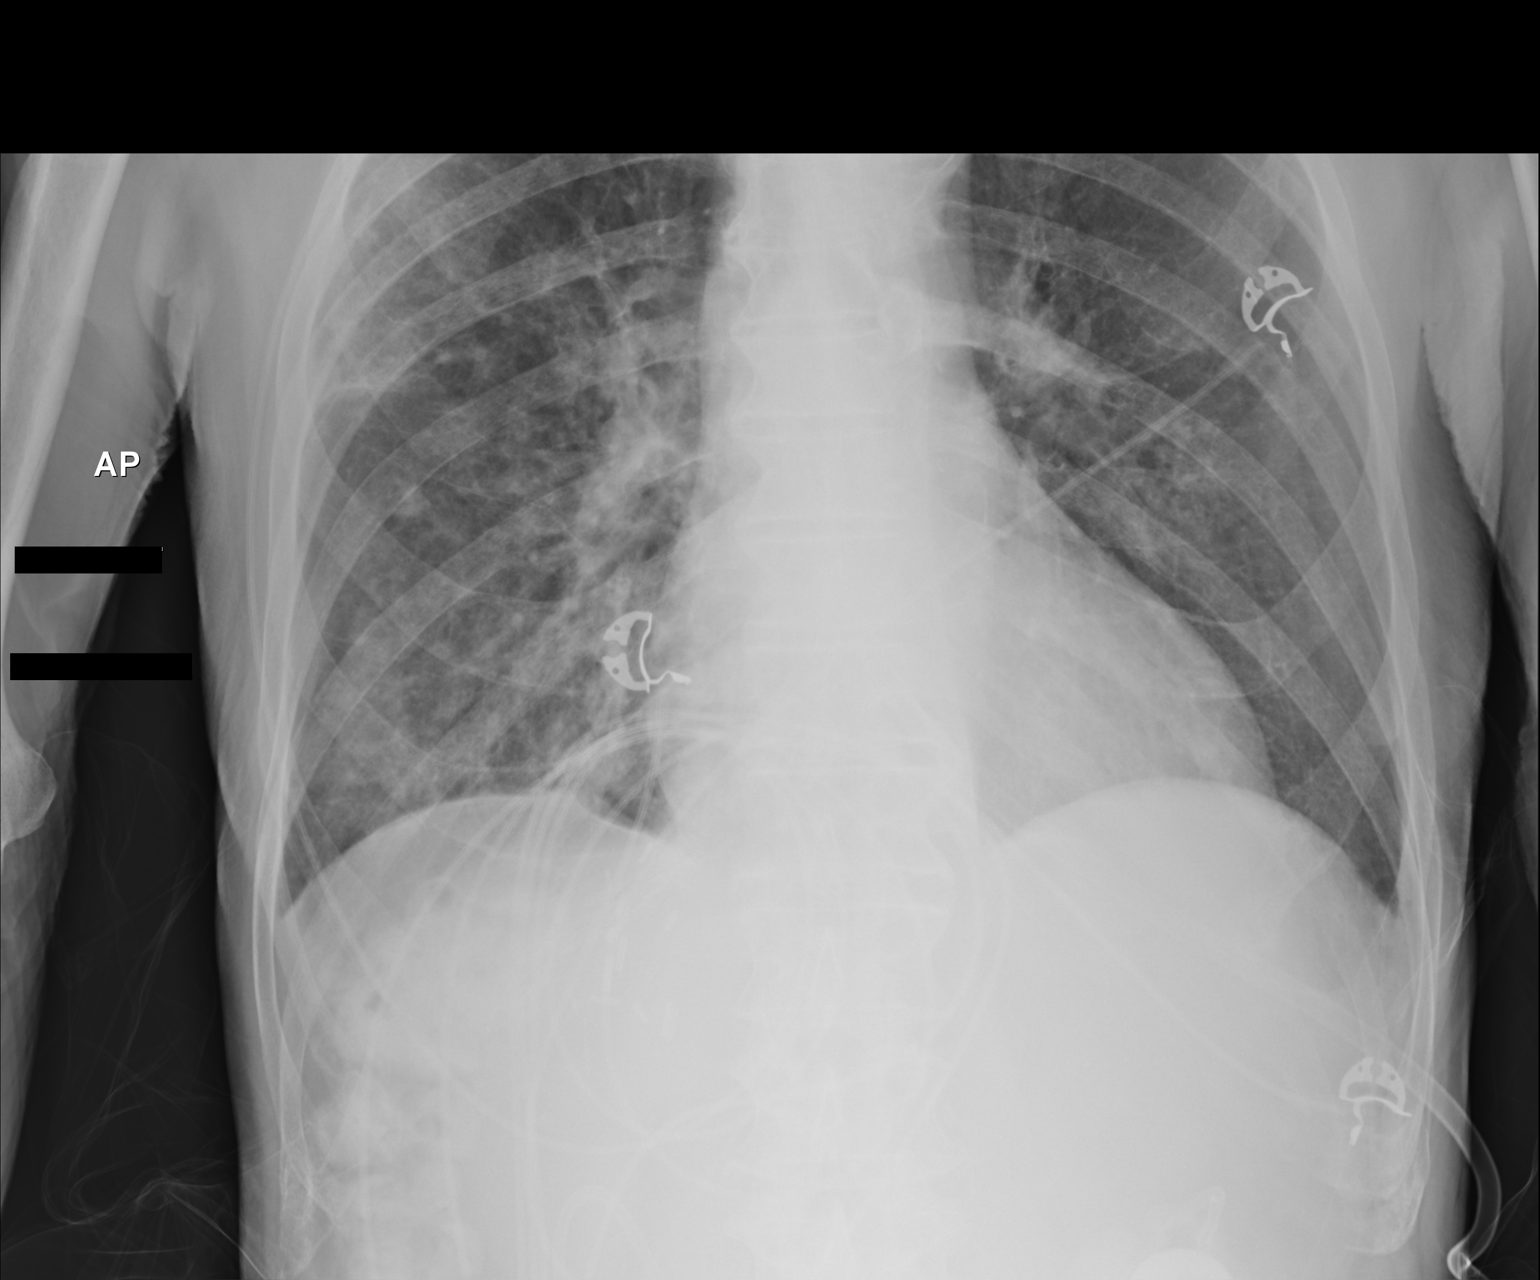

[2 of 2 positions shown; findings below may reference images not displayed]

FINDINGS: Patchy interstitial and alveolar opacities in the right
mid and lower lung and in the left perihilar region, not
convincingly changed in degree of severity since the previous exam.
No definite effusion.  Heart size upper limits normal for
technique.  Atheromatous aortic arch.  Spurring in the thoracic
spine.
IMPRESSION: 1.  Mild change in asymmetric infiltrates or edema.

## 2013-01-12 IMAGING — XA IR REPLACE G/J TUBE W/ FLUORO
1 series · 3 of 3 positions shown · non-contrast
Comparison: 09/28/2012

CLINICAL DATA: Occluded jejunal lumen of dual lumen G J tube

JEJUNAL CATHETER REPLACEMENT

[Series 1: run · 3 of 3 slices shown]
[im 1/3]
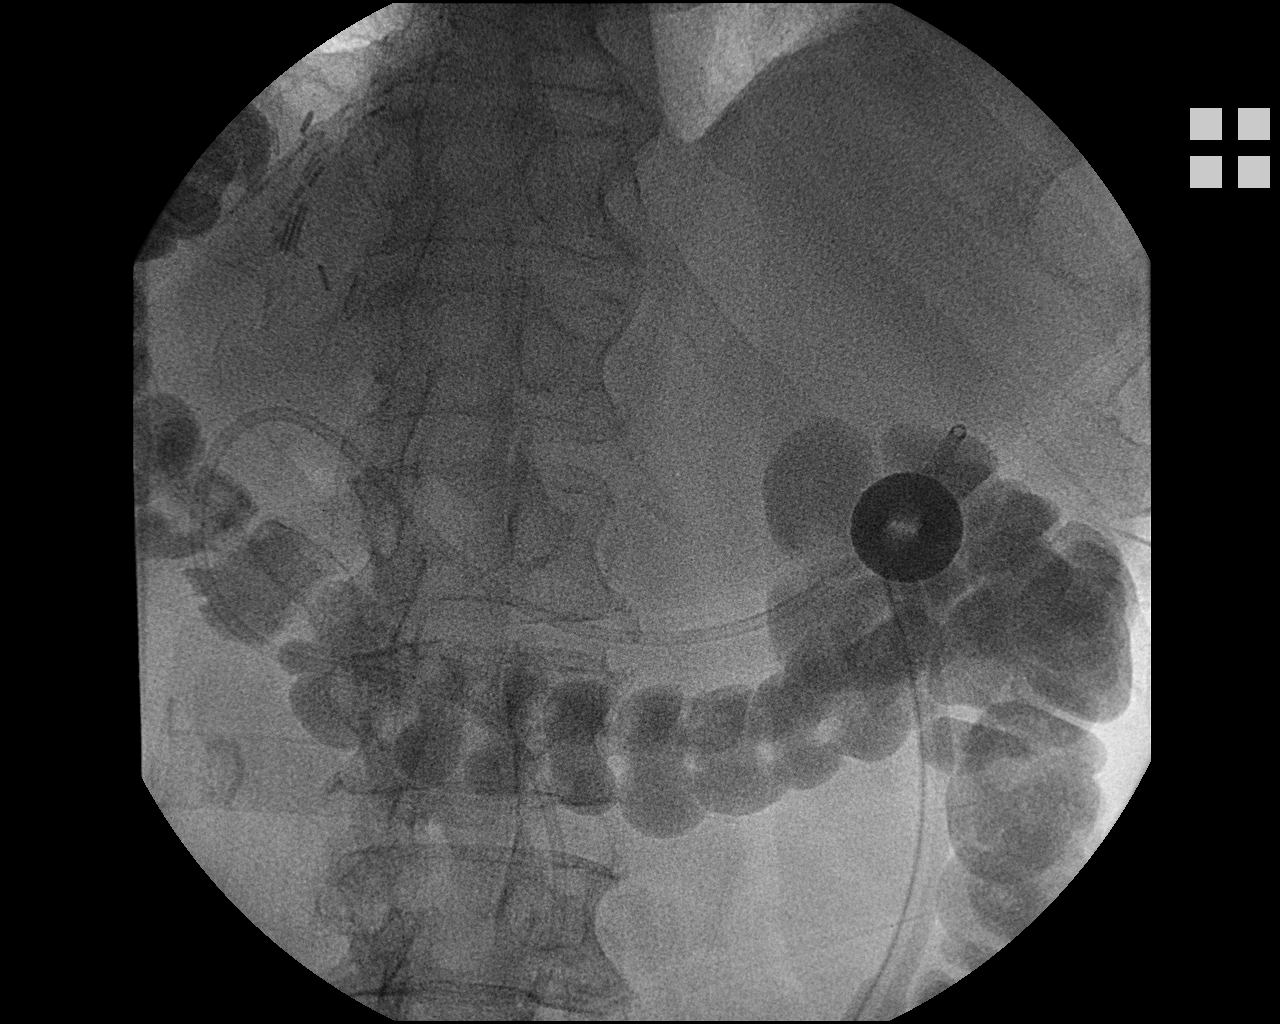
[im 2/3]
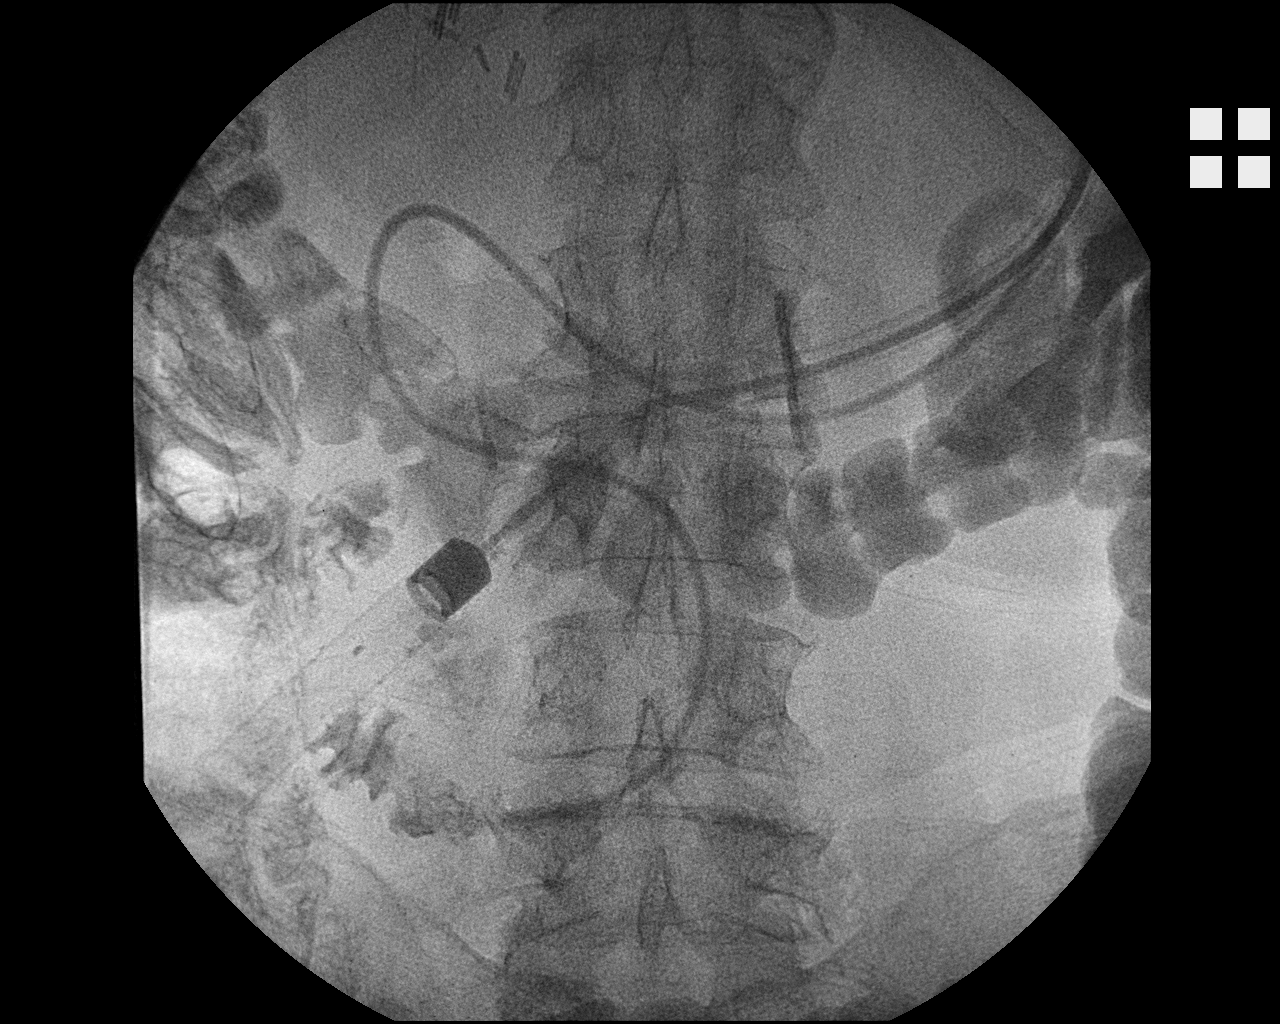
[im 3/3]
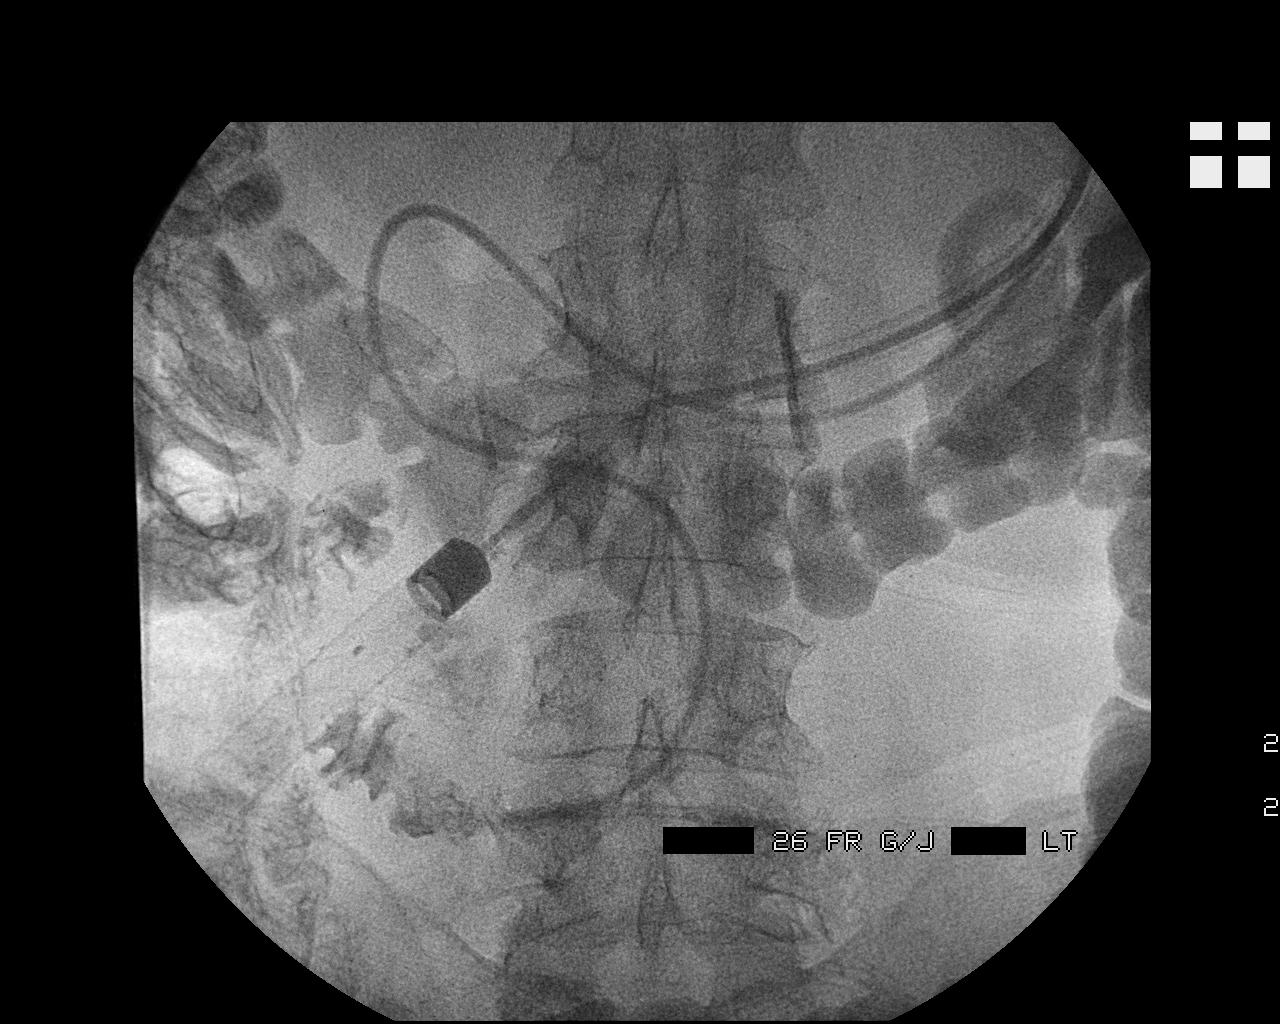

[3 of 3 positions shown; findings below may reference images not displayed]

FINDINGS: The procedure, risks (including but not limited to
bleeding, infection, organ damage), benefits, and alternatives were
explained to the patient.  Questions regarding the procedure were
encouraged and answered.  The patient understands and consents to
the procedure.Initially, forceful saline injection through the
jejunal limb was attempted which was unsuccessful.  Guide wire
would not pass easily beyond the level of the retention balloon.
The previously placed gastrojejunostomy catheter and surrounding
skin were prepped and draped usual sterile fashion. Maximal barrier
sterile technique was utilized including caps, mask, sterile gowns,
sterile gloves, sterile drape, hand hygiene and skin antiseptic.
The retention balloon was deflated.  The catheter was partially
withdrawn, cut, then completely exchanged for a new 26-French dual
lumen gastrojejunostomy catheter, distal tip positioned in the
proximal jejunum, proximal end positioned in the lumen of the
stomach.  The retention balloon was inflated with 10 ml sterile
saline and placed within the gastric lumen.  The external bumper
was applied. Contrast injection under fluoroscopy demonstrates
appropriate catheter position and patency.  The patient tolerated
the procedure well.  No immediate complication.
IMPRESSION: Technically successful exchange of 26-French gastrojejunostomy
catheter.

## 2013-01-14 ENCOUNTER — Telehealth: Payer: Self-pay | Admitting: *Deleted

## 2013-01-14 ENCOUNTER — Ambulatory Visit (INDEPENDENT_AMBULATORY_CARE_PROVIDER_SITE_OTHER): Payer: Medicare Other | Admitting: Critical Care Medicine

## 2013-01-14 ENCOUNTER — Encounter: Payer: Self-pay | Admitting: Critical Care Medicine

## 2013-01-14 VITALS — BP 90/56 | HR 67 | Temp 97.4°F | Ht 69.0 in | Wt 129.0 lb

## 2013-01-14 DIAGNOSIS — E43 Unspecified severe protein-calorie malnutrition: Secondary | ICD-10-CM

## 2013-01-14 DIAGNOSIS — J449 Chronic obstructive pulmonary disease, unspecified: Secondary | ICD-10-CM
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
## 2013-01-14 NOTE — Patient Instructions (Addendum)
No change in medications. Return in         4 months 

## 2013-01-14 NOTE — Telephone Encounter (Signed)
Pt brought form for "combined insurance company of [place]" for PW to fill out.  We have sent this to [employer].

## 2013-01-15 DIAGNOSIS — E43 Unspecified severe protein-calorie malnutrition: Secondary | ICD-10-CM | POA: Insufficient documentation

## 2013-01-15 NOTE — Assessment & Plan Note (Signed)
Gold stage C. COPD with history of microaspiration due to dysphagia. Recurrent aspiration pneumonia now resolved. Significant protein calorie malnutrition still present. Plan Maintain tube feeding supplementation The patient was again cautioned regarding taking food by mouth Maintain inhaled medications as prescribed Return 4 months

## 2013-02-27 ENCOUNTER — Ambulatory Visit
Admission: RE | Admit: 2013-02-27 | Discharge: 2013-02-27 | Disposition: A | Discharge status: Home / Self Care | Payer: Medicare Other | Source: Ambulatory Visit | Attending: Internal Medicine | Admitting: Internal Medicine

## 2013-02-27 ENCOUNTER — Other Ambulatory Visit: Payer: Self-pay | Admitting: Family

## 2013-03-21 ENCOUNTER — Emergency Department (HOSPITAL_COMMUNITY)
Admission: EM | Admit: 2013-03-21 | Discharge: 2013-03-21 | Disposition: A | Discharge status: Home / Self Care | Payer: Medicare Other | Attending: Emergency Medicine | Admitting: Emergency Medicine

## 2013-03-21 DIAGNOSIS — K9423 Gastrostomy malfunction: Secondary | ICD-10-CM | POA: Insufficient documentation

## 2013-03-21 DIAGNOSIS — R630 Anorexia: Secondary | ICD-10-CM | POA: Insufficient documentation

## 2013-03-21 DIAGNOSIS — E119 Type 2 diabetes mellitus without complications: Secondary | ICD-10-CM | POA: Insufficient documentation

## 2013-03-21 DIAGNOSIS — Z7982 Long term (current) use of aspirin: Secondary | ICD-10-CM | POA: Insufficient documentation

## 2013-03-21 DIAGNOSIS — Z9889 Other specified postprocedural states: Secondary | ICD-10-CM | POA: Insufficient documentation

## 2013-03-21 DIAGNOSIS — Z8505 Personal history of malignant neoplasm of liver: Secondary | ICD-10-CM | POA: Insufficient documentation

## 2013-03-21 DIAGNOSIS — Z79899 Other long term (current) drug therapy: Secondary | ICD-10-CM | POA: Insufficient documentation

## 2013-03-21 DIAGNOSIS — IMO0002 Reserved for concepts with insufficient information to code with codable children: Secondary | ICD-10-CM | POA: Insufficient documentation

## 2013-03-21 DIAGNOSIS — Z8673 Personal history of transient ischemic attack (TIA), and cerebral infarction without residual deficits: Secondary | ICD-10-CM | POA: Insufficient documentation

## 2013-03-21 DIAGNOSIS — Z862 Personal history of diseases of the blood and blood-forming organs and certain disorders involving the immune mechanism: Secondary | ICD-10-CM | POA: Insufficient documentation

## 2013-03-21 DIAGNOSIS — Z87891 Personal history of nicotine dependence: Secondary | ICD-10-CM | POA: Insufficient documentation

## 2013-03-21 DIAGNOSIS — I739 Peripheral vascular disease, unspecified: Secondary | ICD-10-CM | POA: Insufficient documentation

## 2013-03-21 DIAGNOSIS — I10 Essential (primary) hypertension: Secondary | ICD-10-CM | POA: Insufficient documentation

## 2013-03-21 DIAGNOSIS — Z8701 Personal history of pneumonia (recurrent): Secondary | ICD-10-CM | POA: Insufficient documentation

## 2013-03-21 DIAGNOSIS — E785 Hyperlipidemia, unspecified: Secondary | ICD-10-CM | POA: Insufficient documentation

## 2013-03-21 DIAGNOSIS — Y833 Surgical operation with formation of external stoma as the cause of abnormal reaction of the patient, or of later complication, without mention of misadventure at the time of the procedure: Secondary | ICD-10-CM | POA: Insufficient documentation

## 2013-03-21 NOTE — ED Notes (Signed)
Pt pulled out G tube last night.

## 2013-03-21 NOTE — ED Provider Notes (Signed)
History     CSN: [id number]  Arrival date & time 03/21/13  7829   First MD Initiated Contact with Patient 03/21/13 0950      No chief complaint on file.   (Consider location/radiation/quality/duration/timing/severity/associated sxs/prior treatment) HPI Patient presents with displacement of gj tube since last night.  Patient states going to bathroom and accidentally tripped and pulled it out.  Caregiver called nurse who told them to wait until today and come to hospital.  Patient is now taking meds and oral nutrition but has lost weight since not using his tube as previously instructed.  The last replacement of this tube was 12/25 and appears tohave been placed 10/01/12.   Discharge summary from 10/2012 [name] was admitted for further evaluation of hematemesis. He had no further episodes during the hospitalization and required no blood products. He was seen by gastroenterology and underwent upper endoscopy with results & recommendations as below. Acute renal failure resolved with IVF and holding of lisinopril and Lasix. Hyperkalemia resolved with kayexalate and holding of lisinopril. Currently metformin is on hold. Suggest repeat BMP as an outpatient, if creatinine remains <1.5 may resume metformin. These and other issues delineated below.  1. Hematemesis--Resolved, no episodes since admission. Hemoglobin stable. Suspected to be secondary to Plavix. RECOMMENDATIONS per GI: continue daily PPI given mild esophagitis, can resume previous medications at this time, including clopidogrel, esophageal ring is nonobstructive, but if dysphagia becomes a symptom, EGD can be repeated for dilation (anticoagulation would need to be held prior to this procedure).  2. Mild esophagitis--plan as above.  3. Non-obstructive Schatzki's ring--plan as above.  4. Acute renal failure--resolved. Can resume Lasix on discharge. Hold lisinopril.  5. Hyperkalemia--resolved s/p Kayexalate, IVF. ACE-I on hold.   6. Normocytic anemia--stable. Appears to be at baseline.  7. DM--Stable. Continue SSI, resume Lantus, meal coverage, Starlix on discharge. Discontinue Metformin for now.  8. Recent stroke--Resume Plavix.  9. HTN--Stable  10. Dementia--Stable. Continue Aricept, risperidone.  11. Polypharmacy  Patient's daughter states due to multiple leaks patient eventually had to go to [facility] for gj placement.   Past Medical History  Diagnosis Date  . DM2 (diabetes mellitus, type 2)   . Hyperlipidemia   . Hypertension   . Cerebral vascular disease   . HCC (hepatocellular carcinoma)   . Blood dyscrasia     THROMBCYTOPATHIA  . Peripheral vascular disease   . Pneumonia 11/03/2012    Past Surgical History  Procedure Laterality Date  . Carotid stent  2002  . Liver canc    . Posterior cervical fusion/foraminotomy  09/25/2012    Procedure: POSTERIOR CERVICAL FUSION/FORAMINOTOMY LEVEL 2;  Surgeon: Tia Alert, MD;  Location: [facility] NEURO ORS;  Service: Neurosurgery;  Laterality: N/A;  Posterior Cervical three-four laminectomy, posterior cervical three-four, four-five fusion  . Esophagogastroduodenoscopy  10/01/2012    Procedure: ESOPHAGOGASTRODUODENOSCOPY (EGD);  Surgeon: Cherylynn Ridges, MD;  Location: [facility] ENDOSCOPY;  Service: General;  Laterality: N/A;  wyatt/leone  . Peg placement  10/01/2012    Procedure: PERCUTANEOUS ENDOSCOPIC GASTROSTOMY (PEG) PLACEMENT;  Surgeon: Cherylynn Ridges, MD;  Location: [facility] ENDOSCOPY;  Service: General;  Laterality: N/A;  . Esophagogastroduodenoscopy  10/31/2012    Procedure: ESOPHAGOGASTRODUODENOSCOPY (EGD);  Surgeon: Theda Belfast, MD;  Location: [facility] ENDOSCOPY;  Service: Endoscopy;  Laterality: N/A;    No family history on file.  History  Substance Use Topics  . Smoking status: Former Smoker -- 1.00 packs/day for 50 years    Types: Cigarettes    Quit date:  01/01/2012  . Smokeless tobacco: Never Used  . Alcohol Use: 1.2 oz/week    2 Cans of beer per week     Comment:  daily      Review of Systems  Constitutional: Positive for appetite change.  All other systems reviewed and are negative.    Allergies  Review of patient's allergies indicates no known allergies.  Home Medications   Current Outpatient Rx  Name  Route  Sig  Dispense  Refill  . albuterol (PROVENTIL) (5 MG/ML) 0.5% nebulizer solution   Nebulization   Take 0.5 mLs (2.5 mg total) by nebulization every 6 (six) hours.   260 mL   6   . aspirin 81 MG chewable tablet   Tube   Give 81 mg by tube.         Marland Kitchen atorvastatin (LIPITOR) 40 MG tablet   Tube   Give 40 mg by tube daily.          . collagenase (SANTYL) ointment   Topical   Apply topically daily.   15 g      . Fluticasone-Salmeterol (ADVAIR) 250-50 MCG/DOSE AEPB   Inhalation   Inhale 1 puff into the lungs every 12 (twelve) hours.         Marland Kitchen ipratropium (ATROVENT) 0.02 % nebulizer solution   Nebulization   Take 2.5 mLs (0.5 mg total) by nebulization every 6 (six) hours.   260 mL   6   . levothyroxine (SYNTHROID, LEVOTHROID) 88 MCG tablet   Tube   Give 88 mcg by tube daily.          Marland Kitchen linagliptin (TRADJENTA) 5 MG TABS tablet   Tube   Give 5 mg by tube daily.          Marland Kitchen liver oil-zinc oxide (DESITIN) 40 % ointment   Topical   Apply topically 3 (three) times daily as needed.   56.7 g      . metFORMIN (GLUMETZA) 1000 MG (MOD) 24 hr tablet   Tube   Give 1,000 mg by tube 2 (two) times daily with a meal.          . Nutritional Supplements (FEEDING SUPPLEMENT, OSMOLITE 1.5 CAL,) LIQD   Per Tube   Place 1,000 mLs into feeding tube continuous.         Marland Kitchen oxyCODONE (ROXICODONE) 5 MG/5ML solution   Per Tube   Place 5 mg into feeding tube every 6 (six) hours as needed. Pain         . Water For Irrigation, Sterile (FREE WATER) SOLN   Per Tube   Place 60 mLs into feeding tube every 4 (four) hours.           BP 169/75  Pulse 59  Temp(Src) 97.4 F (36.3 C) (Axillary)  Resp 18  Ht 5\' 9"   (1.753 m)  Wt 114 lb (51.71 kg)  BMI 16.83 kg/m2  SpO2 100%  Physical Exam  Nursing note and vitals reviewed. Constitutional: He appears well-developed and well-nourished.  cachechtic   HENT:  Head: Normocephalic.  Right Ear: External ear normal.  Left Ear: External ear normal.  Mouth/Throat: Oropharynx is clear and moist.  Eyes: Conjunctivae are normal. Pupils are equal, round, and reactive to light.  Neck: Normal range of motion. Neck supple.  Cardiovascular: Normal rate.   Pulmonary/Chest: Effort normal and breath sounds normal.  Abdominal: Soft. Bowel sounds are normal. There is no tenderness.  Left periumbilical gastrostomy site with epitheliazation  Musculoskeletal: Normal range of  motion.  Neurological: He is alert.  Skin: Skin is warm and dry.  Psychiatric: He has a normal mood and affect.    ED Course  Procedures (including critical care time)  Labs Reviewed - No data to display No results found.   No diagnosis found.    I attempted replacement of gj tube with g tube of same size of 20 [place] but no tract is palpated and tube is unable to be advance past skin.  Discussed with patient and daughter that he has not recently been using tube and would be reasonable to discuss with pmd on on Monday.  Patient and daughter state that they have discussed d/c'ing tube.  I have advised contacting pmd on Monday and if they wish, they can refer back to [facility] for replacement of tube.  Patient and daughter cautioned re thickening of fluids and return if any signs of aspiration.        Hilario Quarry, MD 03/21/13 1030

## 2013-03-21 NOTE — ED Notes (Signed)
Pt escorted to discharge window. Pt verbalized understanding discharge instructions. In no acute distress.  

## 2013-03-21 NOTE — ED Notes (Signed)
md attempted to place 58F G tube back into stoma, unsuccessful.

## 2013-03-21 NOTE — ED Notes (Signed)
md at bedside

## 2013-04-28 ENCOUNTER — Emergency Department (HOSPITAL_COMMUNITY)
Admission: EM | Admit: 2013-04-28 | Discharge: 2013-04-29 | Disposition: A | Discharge status: Home / Self Care | Payer: Medicare Other | Attending: Emergency Medicine | Admitting: Emergency Medicine

## 2013-04-28 ENCOUNTER — Encounter (HOSPITAL_COMMUNITY): Payer: Self-pay | Admitting: *Deleted

## 2013-04-28 DIAGNOSIS — IMO0002 Reserved for concepts with insufficient information to code with codable children: Secondary | ICD-10-CM | POA: Insufficient documentation

## 2013-04-28 DIAGNOSIS — E785 Hyperlipidemia, unspecified: Secondary | ICD-10-CM | POA: Insufficient documentation

## 2013-04-28 DIAGNOSIS — Z79899 Other long term (current) drug therapy: Secondary | ICD-10-CM | POA: Insufficient documentation

## 2013-04-28 DIAGNOSIS — Z8505 Personal history of malignant neoplasm of liver: Secondary | ICD-10-CM | POA: Insufficient documentation

## 2013-04-28 DIAGNOSIS — I10 Essential (primary) hypertension: Secondary | ICD-10-CM | POA: Insufficient documentation

## 2013-04-28 DIAGNOSIS — Z9089 Acquired absence of other organs: Secondary | ICD-10-CM | POA: Insufficient documentation

## 2013-04-28 DIAGNOSIS — Z8673 Personal history of transient ischemic attack (TIA), and cerebral infarction without residual deficits: Secondary | ICD-10-CM | POA: Insufficient documentation

## 2013-04-28 DIAGNOSIS — E119 Type 2 diabetes mellitus without complications: Secondary | ICD-10-CM | POA: Insufficient documentation

## 2013-04-28 DIAGNOSIS — Z9889 Other specified postprocedural states: Secondary | ICD-10-CM | POA: Insufficient documentation

## 2013-04-28 DIAGNOSIS — Z8701 Personal history of pneumonia (recurrent): Secondary | ICD-10-CM | POA: Insufficient documentation

## 2013-04-28 DIAGNOSIS — I739 Peripheral vascular disease, unspecified: Secondary | ICD-10-CM | POA: Insufficient documentation

## 2013-04-28 DIAGNOSIS — Z7982 Long term (current) use of aspirin: Secondary | ICD-10-CM | POA: Insufficient documentation

## 2013-04-28 DIAGNOSIS — K5641 Fecal impaction: Secondary | ICD-10-CM | POA: Insufficient documentation

## 2013-04-28 DIAGNOSIS — Z8719 Personal history of other diseases of the digestive system: Secondary | ICD-10-CM | POA: Insufficient documentation

## 2013-04-28 DIAGNOSIS — Z862 Personal history of diseases of the blood and blood-forming organs and certain disorders involving the immune mechanism: Secondary | ICD-10-CM | POA: Insufficient documentation

## 2013-04-28 DIAGNOSIS — Z87891 Personal history of nicotine dependence: Secondary | ICD-10-CM | POA: Insufficient documentation

## 2013-04-28 NOTE — ED Notes (Signed)
Pt c/o rectal pain; unable to have bowel movement x 3 days; sats 85% in triage

## 2013-04-29 ENCOUNTER — Emergency Department (HOSPITAL_COMMUNITY): Payer: Medicare Other

## 2013-04-29 ENCOUNTER — Encounter (HOSPITAL_COMMUNITY): Payer: Self-pay

## 2013-04-29 LAB — CBC WITH DIFFERENTIAL/PLATELET
Eosinophils Absolute: 0.1 10*3/uL (ref 0.0–0.7)
Eosinophils Relative: 1 % (ref 0–5)
HCT: 38.8 % — ABNORMAL LOW (ref 39.0–52.0)
Hemoglobin: 13.1 g/dL (ref 13.0–17.0)
Lymphocytes Relative: 14 % (ref 12–46)
Lymphs Abs: 1.3 10*3/uL (ref 0.7–4.0)
MCH: 29.9 pg (ref 26.0–34.0)
MCV: 88.6 fL (ref 78.0–100.0)
Monocytes Absolute: 1 10*3/uL (ref 0.1–1.0)
Monocytes Relative: 11 % (ref 3–12)
Platelets: 174 10*3/uL (ref 150–400)
RBC: 4.38 MIL/uL (ref 4.22–5.81)
WBC: 9 10*3/uL (ref 4.0–10.5)

## 2013-04-29 LAB — COMPREHENSIVE METABOLIC PANEL
AST: 36 U/L (ref 0–37)
Albumin: 2.8 g/dL — ABNORMAL LOW (ref 3.5–5.2)
BUN: 36 mg/dL — ABNORMAL HIGH (ref 6–23)
Calcium: 9.7 mg/dL (ref 8.4–10.5)
Chloride: 94 mEq/L — ABNORMAL LOW (ref 96–112)
Creatinine, Ser: 0.76 mg/dL (ref 0.50–1.35)
Total Bilirubin: 0.5 mg/dL (ref 0.3–1.2)
Total Protein: 7.8 g/dL (ref 6.0–8.3)

## 2013-04-29 MED ORDER — LIDOCAINE (ANORECTAL) 5 % EX CREA: TOPICAL_CREAM | CUTANEOUS | Status: DC

## 2013-04-29 MED ORDER — IOHEXOL 300 MG/ML  SOLN
80.0000 mL | Freq: Once | INTRAMUSCULAR | Status: AC | PRN
  Administered 2013-04-29: 80 mL via INTRAVENOUS

## 2013-04-29 MED ORDER — MILK AND MOLASSES ENEMA
Freq: Once | RECTAL | Status: AC
  Administered 2013-04-29: 03:00:00 via RECTAL
  Filled 2013-04-29: qty 250

## 2013-04-29 MED ORDER — IOHEXOL 300 MG/ML  SOLN
50.0000 mL | Freq: Once | INTRAMUSCULAR | Status: AC | PRN
  Administered 2013-04-29: 50 mL via ORAL

## 2013-04-29 MED ORDER — LIDOCAINE HCL 2 % EX GEL
Freq: Once | CUTANEOUS | Status: AC
  Administered 2013-04-29: 10 via TOPICAL
  Filled 2013-04-29: qty 10

## 2013-04-29 MED ORDER — POLYETHYLENE GLYCOL 3350 17 G PO PACK: PACK | ORAL | Status: DC

## 2013-04-29 MED ORDER — DISPOSABLE ENEMA 19-7 GM/118ML RE ENEM: ENEMA | RECTAL | Status: DC

## 2013-04-29 NOTE — ED Provider Notes (Signed)
History     CSN: [id number]  Arrival date & time 04/28/13  2033   First MD Initiated Contact with Patient 04/29/13 0038      Chief Complaint  Patient presents with  . Constipation    (Consider location/radiation/quality/duration/timing/severity/associated sxs/prior treatment) HPI This is a 79 year old male with a history of liver cancer status post liver resection. Due to swallowing difficulties he has a PEG tube. He drinks Ensure during the day and receives tube feedings at night. Despite this he is gone from 130 pounds to 105 pounds over the past 4 weeks. He is here because of constipation. He has not had a bowel movement in over 4 days. There is moderate rectal discomfort associated with attempts at the limitation. His abdomen is not distended and he has not been vomiting. He is taken laxatives and a suppository without relief. He also complains of some generalized weakness.  Past Medical History  Diagnosis Date  . DM2 (diabetes mellitus, type 2)   . Hyperlipidemia   . Hypertension   . Cerebral vascular disease   . HCC (hepatocellular carcinoma)   . Blood dyscrasia     THROMBCYTOPATHIA  . Peripheral vascular disease   . Pneumonia 11/03/2012    Past Surgical History  Procedure Laterality Date  . Carotid stent  2002  . Liver canc    . Posterior cervical fusion/foraminotomy  09/25/2012    Procedure: POSTERIOR CERVICAL FUSION/FORAMINOTOMY LEVEL 2;  Surgeon: Tia Alert, MD;  Location: [facility];  Service: Neurosurgery;  Laterality: N/A;  Posterior Cervical three-four laminectomy, posterior cervical three-four, four-five fusion  . Esophagogastroduodenoscopy  10/01/2012    Procedure: ESOPHAGOGASTRODUODENOSCOPY (EGD);  Surgeon: Cherylynn Ridges, MD;  Location: [facility] ENDOSCOPY;  Service: General;  Laterality: N/A;  wyatt/leone  . Peg placement  10/01/2012    Procedure: PERCUTANEOUS ENDOSCOPIC GASTROSTOMY (PEG) PLACEMENT;  Surgeon: Cherylynn Ridges, MD;  Location: [facility] ENDOSCOPY;  Service:  General;  Laterality: N/A;  . Esophagogastroduodenoscopy  10/31/2012    Procedure: ESOPHAGOGASTRODUODENOSCOPY (EGD);  Surgeon: Theda Belfast, MD;  Location: [facility] ENDOSCOPY;  Service: Endoscopy;  Laterality: N/A;    No family history on file.  History  Substance Use Topics  . Smoking status: Former Smoker -- 1.00 packs/day for 50 years    Types: Cigarettes    Quit date: 01/01/2012  . Smokeless tobacco: Never Used  . Alcohol Use: 1.2 oz/week    2 Cans of beer per week     Comment: daily      Review of Systems  All other systems reviewed and are negative.    Allergies  Review of patient's allergies indicates no known allergies.  Home Medications   Current Outpatient Rx  Name  Route  Sig  Dispense  Refill  . albuterol (PROVENTIL) (5 MG/ML) 0.5% nebulizer solution   Nebulization   Take 0.5 mLs (2.5 mg total) by nebulization every 6 (six) hours.   260 mL   6   . cyproheptadine (PERIACTIN) 4 MG tablet   Oral   Take 4 mg by mouth every evening.         . Fluticasone-Salmeterol (ADVAIR) 250-50 MCG/DOSE AEPB   Inhalation   Inhale 1 puff into the lungs every 12 (twelve) hours.         Marland Kitchen ipratropium (ATROVENT) 0.02 % nebulizer solution   Nebulization   Take 2.5 mLs (0.5 mg total) by nebulization every 6 (six) hours.   260 mL   6   . levothyroxine (SYNTHROID, LEVOTHROID) 100  MCG tablet   Oral   Take 100 mcg by mouth daily before breakfast.         . linagliptin (TRADJENTA) 5 MG TABS tablet   Tube   Give 5 mg by tube daily.          . meloxicam (MOBIC) 7.5 MG tablet   Oral   Take 7.5 mg by mouth daily.         . mirtazapine (REMERON) 30 MG tablet   Oral   Take 30 mg by mouth at bedtime.         . Nutritional Supplements (FEEDING SUPPLEMENT, OSMOLITE 1.5 CAL,) LIQD   Per Tube   Place 1,000 mLs into feeding tube continuous.         Marland Kitchen aspirin EC 81 MG tablet   Oral   Take 81 mg by mouth daily.           BP 125/58  Pulse 76  Temp(Src) 98 F  (36.7 C)  Resp 18  SpO2 96%  Physical Exam General: Well-developed, cachectic male in no acute distress; appearance consistent with age of record HENT: normocephalic, atraumatic; edentulous Eyes: pupils equal round and reactive to light; extraocular muscles intact Neck: supple Heart: regular rate and rhythm Lungs: clear to auscultation bilaterally Abdomen: soft; nondistended; mild diffuse tenderness; bowel sounds present; PEG tube present Rectal: Normal sphincter tone; external hemorrhoids; impaction of soft pasty stool Extremities: Arthritic changes; pulses normal Neurologic: Awake, alert and oriented; motor function intact in all extremities and symmetric; no facial droop Skin: Generalized dry scaly skin Psychiatric: Normal mood and affect    ED Course  Procedures (including critical care time)    MDM   Nursing notes and vitals signs, including pulse oximetry, reviewed.  Summary of this visit's results, reviewed by myself:  Labs:  Results for orders placed during the hospital encounter of 04/28/13 (from the past 24 hour(s))  COMPREHENSIVE METABOLIC PANEL     Status: Abnormal   Collection Time    04/29/13 12:58 AM      Result Value Range   Sodium 136  135 - 145 mEq/L   Potassium 4.8  3.5 - 5.1 mEq/L   Chloride 94 (*) 96 - 112 mEq/L   CO2 31  19 - 32 mEq/L   Glucose, Bld 151 (*) 70 - 99 mg/dL   BUN 36 (*) 6 - 23 mg/dL   Creatinine, Ser 7.82  0.50 - 1.35 mg/dL   Calcium 9.7  8.4 - 95.6 mg/dL   Total Protein 7.8  6.0 - 8.3 g/dL   Albumin 2.8 (*) 3.5 - 5.2 g/dL   AST 36  0 - 37 U/L   ALT 18  0 - 53 U/L   Alkaline Phosphatase 135 (*) 39 - 117 U/L   Total Bilirubin 0.5  0.3 - 1.2 mg/dL   GFR calc non Af Amer 85 (*) >90 mL/min   GFR calc Af Amer >90  >90 mL/min  CBC WITH DIFFERENTIAL     Status: Abnormal   Collection Time    04/29/13 12:58 AM      Result Value Range   WBC 9.0  4.0 - 10.5 K/uL   RBC 4.38  4.22 - 5.81 MIL/uL   Hemoglobin 13.1  13.0 - 17.0 g/dL    HCT 21.3 (*) 08.6 - 52.0 %   MCV 88.6  78.0 - 100.0 fL   MCH 29.9  26.0 - 34.0 pg   MCHC 33.8  30.0 - 36.0 g/dL  RDW 16.8 (*) 11.5 - 15.5 %   Platelets 174  150 - 400 K/uL   Neutrophils Relative 73  43 - 77 %   Neutro Abs 6.6  1.7 - 7.7 K/uL   Lymphocytes Relative 14  12 - 46 %   Lymphs Abs 1.3  0.7 - 4.0 K/uL   Monocytes Relative 11  3 - 12 %   Monocytes Absolute 1.0  0.1 - 1.0 K/uL   Eosinophils Relative 1  0 - 5 %   Eosinophils Absolute 0.1  0.0 - 0.7 K/uL   Basophils Relative 0  0 - 1 %   Basophils Absolute 0.0  0.0 - 0.1 K/uL    Imaging Studies: Ct Abdomen Pelvis W Contrast  04/29/2013  *RADIOLOGY REPORT*  Clinical Data: Weight loss.  Constipation.  Rectal pain.  History of liver cancer post right partial hepatectomy.  CT ABDOMEN AND PELVIS WITH CONTRAST  Technique:  Multidetector CT imaging of the abdomen and pelvis was performed following the standard protocol during bolus administration of intravenous contrast.  Contrast: 80mL OMNIPAQUE IOHEXOL 300 MG/ML  SOLN  Comparison: 11/05/2012  Findings: Nodular infiltration and consolidation in the lower lungs, greatest in the left lower lung.  The similar appearance was present on the previous study although changes were more prominent on the right side at that time.  This is most consistent with an acute inflammatory process such as aspiration pneumonia or bronchitis.  Metastatic disease is not entirely excluded although the difference in pattern since previous study suggests a recurrent inflammatory process.  Emphysematous changes and fibrosis in the lung bases.  Previous surgical resection of the right lobe of the liver.  No evidence of any residual or recurrent tumor.  Sub centimeter cyst in the lateral segment left lobe of the liver appears stable since previous study.  Spleen is normal.  Pancreas, adrenal glands, inferior vena cava, and retroperitoneal lymph nodes are unremarkable.  Diffuse atherosclerotic calcification throughout the  aorta and iliac arteries.  No aneurysm.  13 mm cyst in the lower pole of the left kidney.  10 mm cyst in the upper pole of the left kidney.  8 mm cyst in the midpole of the left kidney.  No solid mass or hydronephrosis in either kidney.  Percutaneous gastrojejunostomy tube appears in place.  The stomach, small bowel, and colon appear otherwise unremarkable.  No free air or free fluid in the abdomen.  Pelvis:  Calcification in the prostate gland.  No bladder wall thickening.  Rectum is somewhat distended with contrast material but there is no evidence of wall thickening or inflammatory change. The appendix is normal.  No diverticulitis.  No free or loculated pelvic fluid collections.  Degenerative changes in the lumbar spine.  No destructive bone lesions appreciated.  IMPRESSION: Nodular inflammatory changes and consolidation in the lung bases most consistent with aspiration or acute bronchitis.  The stable postoperative changes in the right lobe of the liver.  No evidence of recurrent or metastatic disease.   Original Report Authenticated By: Burman Nieves, M.D.    3:12 AM Oxygen saturation is in the 90s on room air. Now the CT results are completed he will be administered a milk and molasses enema.  7:10 AM Patient has been able to pass some stool after 2 enemas. We will have him take MiraLax and Fleet enema daily until his impaction has resolved. The cause of his weight loss was not apparent as there is no evidence of recurrence of his cancer.  Hanley Seamen, MD 04/29/13 [phone]

## 2013-04-29 NOTE — ED Notes (Signed)
Pt c/o of lower abd pain d/t stool.

## 2013-04-29 NOTE — ED Notes (Signed)
Pt still having trouble passing stool.

## 2013-04-29 NOTE — ED Notes (Signed)
Patient transported to CT 

## 2013-07-11 ENCOUNTER — Emergency Department (HOSPITAL_COMMUNITY)
Admission: EM | Admit: 2013-07-11 | Discharge: 2013-07-11 | Disposition: A | Discharge status: Home / Self Care | Payer: Medicare Other | Attending: Emergency Medicine | Admitting: Emergency Medicine

## 2013-07-11 ENCOUNTER — Emergency Department (HOSPITAL_COMMUNITY): Payer: Medicare Other

## 2013-07-11 ENCOUNTER — Encounter (HOSPITAL_COMMUNITY): Payer: Self-pay | Admitting: *Deleted

## 2013-07-11 DIAGNOSIS — L03311 Cellulitis of abdominal wall: Secondary | ICD-10-CM

## 2013-07-11 DIAGNOSIS — Z87891 Personal history of nicotine dependence: Secondary | ICD-10-CM | POA: Insufficient documentation

## 2013-07-11 DIAGNOSIS — Z8639 Personal history of other endocrine, nutritional and metabolic disease: Secondary | ICD-10-CM | POA: Insufficient documentation

## 2013-07-11 DIAGNOSIS — Z8679 Personal history of other diseases of the circulatory system: Secondary | ICD-10-CM | POA: Insufficient documentation

## 2013-07-11 DIAGNOSIS — Z8701 Personal history of pneumonia (recurrent): Secondary | ICD-10-CM | POA: Insufficient documentation

## 2013-07-11 DIAGNOSIS — Z862 Personal history of diseases of the blood and blood-forming organs and certain disorders involving the immune mechanism: Secondary | ICD-10-CM | POA: Insufficient documentation

## 2013-07-11 DIAGNOSIS — I10 Essential (primary) hypertension: Secondary | ICD-10-CM | POA: Insufficient documentation

## 2013-07-11 DIAGNOSIS — Z7982 Long term (current) use of aspirin: Secondary | ICD-10-CM | POA: Insufficient documentation

## 2013-07-11 DIAGNOSIS — E119 Type 2 diabetes mellitus without complications: Secondary | ICD-10-CM | POA: Insufficient documentation

## 2013-07-11 DIAGNOSIS — Z79899 Other long term (current) drug therapy: Secondary | ICD-10-CM | POA: Insufficient documentation

## 2013-07-11 DIAGNOSIS — L02219 Cutaneous abscess of trunk, unspecified: Secondary | ICD-10-CM | POA: Insufficient documentation

## 2013-07-11 DIAGNOSIS — Z8505 Personal history of malignant neoplasm of liver: Secondary | ICD-10-CM | POA: Insufficient documentation

## 2013-07-11 LAB — COMPREHENSIVE METABOLIC PANEL
BUN: 24 mg/dL — ABNORMAL HIGH (ref 6–23)
CO2: 30 mEq/L (ref 19–32)
Calcium: 10 mg/dL (ref 8.4–10.5)
Chloride: 99 mEq/L (ref 96–112)
Creatinine, Ser: 0.7 mg/dL (ref 0.50–1.35)
GFR calc non Af Amer: 87 mL/min — ABNORMAL LOW (ref >90)
Total Bilirubin: 0.9 mg/dL (ref 0.3–1.2)

## 2013-07-11 LAB — CBC WITH DIFFERENTIAL/PLATELET
Basophils Absolute: 0 10*3/uL (ref 0.0–0.1)
Eosinophils Relative: 4 % (ref 0–5)
HCT: 46.2 % (ref 39.0–52.0)
Lymphs Abs: 1.2 10*3/uL (ref 0.7–4.0)
MCH: 31.3 pg (ref 26.0–34.0)
MCV: 92.2 fL (ref 78.0–100.0)
Monocytes Absolute: 0.9 10*3/uL (ref 0.1–1.0)
Monocytes Relative: 11 % (ref 3–12)
Neutro Abs: 5.4 10*3/uL (ref 1.7–7.7)
Platelets: DECREASED 10*3/uL (ref 150–400)
RDW: 14.7 % (ref 11.5–15.5)
WBC: 7.8 10*3/uL (ref 4.0–10.5)

## 2013-07-11 LAB — PROTIME-INR
INR: 1.29 (ref 0.00–1.49)
Prothrombin Time: 15.8 seconds — ABNORMAL HIGH (ref 11.6–15.2)

## 2013-07-11 LAB — TROPONIN I: Troponin I: <0.3 ng/mL (ref <0.30)

## 2013-07-11 MED ORDER — SULFAMETHOXAZOLE-TRIMETHOPRIM 800-160 MG PO TABS: 1.0000 | ORAL_TABLET | Freq: Two times a day (BID) | ORAL | Status: DC

## 2013-07-11 MED ORDER — IOHEXOL 300 MG/ML  SOLN
50.0000 mL | Freq: Once | INTRAMUSCULAR | Status: AC | PRN
  Administered 2013-07-11: 50 mL via ORAL

## 2013-07-11 MED ORDER — HYDROCODONE-ACETAMINOPHEN 5-325 MG PO TABS: 2.0000 | ORAL_TABLET | ORAL | Status: DC | PRN

## 2013-07-11 MED ORDER — IOHEXOL 300 MG/ML  SOLN
50.0000 mL | Freq: Once | INTRAMUSCULAR | Status: AC | PRN
  Administered 2013-07-11: 50 mL

## 2013-07-11 MED ORDER — CEPHALEXIN 500 MG PO CAPS: 500.0000 mg | ORAL_CAPSULE | Freq: Four times a day (QID) | ORAL | Status: DC

## 2013-07-11 NOTE — ED Notes (Signed)
Abd CT being canceled per MD Rancour, pt did not drink contrast.

## 2013-07-11 NOTE — ED Notes (Signed)
Pt presents w/ c/o leaking and pain around site of J-G tube. Pt states normally has to be replaced at [facility] due to complexity of placement. Pt states he is two tube feedings behind in feeding.

## 2013-07-11 NOTE — ED Provider Notes (Signed)
History    CSN: [id number] Arrival date & time 07/11/13  4098  First MD Initiated Contact with Patient 07/11/13 2006     Chief Complaint  Patient presents with  . Feeding tube pain    (Consider location/radiation/quality/duration/timing/severity/associated sxs/prior Treatment) HPI Comments: Patient has a GJ tube for chronic aspiration which was last replaced at [facility]. He has had pain irritation at the site for the past 2 days. He's been afraid to use it. Last time used was 2 days ago it was working properly. Denies any vomiting or diarrhea. No fevers. There is pain surrounding the tube itself but no abdominal pain otherwise.  The history is provided by the patient and a relative.   Past Medical History  Diagnosis Date  . DM2 (diabetes mellitus, type 2)   . Hyperlipidemia   . Hypertension   . Cerebral vascular disease   . HCC (hepatocellular carcinoma)   . Blood dyscrasia     THROMBCYTOPATHIA  . Peripheral vascular disease   . Pneumonia 11/03/2012   Past Surgical History  Procedure Laterality Date  . Carotid stent  2002  . Liver canc    . Posterior cervical fusion/foraminotomy  09/25/2012    Procedure: POSTERIOR CERVICAL FUSION/FORAMINOTOMY LEVEL 2;  Surgeon: Tia Alert, MD;  Location: [facility]UR[facility]S;  Service: Neurosurgery;  Laterality: N/A;  Posterior Cervical three-four laminectomy, posterior cervical three-four, four-five fusion  . Esophagogastroduodenoscopy  10/01/2012    Procedure: ESOPHAGOGASTRODUODENOSCOPY (EGD);  Surgeon: Cherylynn Ridges, MD;  Location: [facility] ENDOSCOPY;  Service: General;  Laterality: N/A;  wyatt/leone  . Peg placement  10/01/2012    Procedure: PERCUTANEOUS ENDOSCOPIC GASTROSTOMY (PEG) PLACEMENT;  Surgeon: Cherylynn Ridges, MD;  Location: [facility] ENDOSCOPY;  Service: General;  Laterality: N/A;  . Esophagogastroduodenoscopy  10/31/2012    Procedure: ESOPHAGOGASTRODUODENOSCOPY (EGD);  Surgeon: Theda Belfast, MD;  Location: [facility] ENDOSCOPY;  Service: Endoscopy;   Laterality: N/A;   History reviewed. No pertinent family history. History  Substance Use Topics  . Smoking status: Former Smoker -- 1.00 packs/day for 50 years    Types: Cigarettes    Quit date: 01/01/2012  . Smokeless tobacco: Never Used  . Alcohol Use: 1.2 oz/week    2 Cans of beer per week     Comment: daily    Review of Systems  Constitutional: Negative for fever, activity change and appetite change.  HENT: Negative for congestion and rhinorrhea.   Respiratory: Negative for cough, chest tightness and shortness of breath.   Cardiovascular: Negative for chest pain.  Gastrointestinal: Positive for abdominal pain. Negative for nausea and vomiting.  Genitourinary: Negative for dysuria and hematuria.  Musculoskeletal: Negative for back pain.  Skin: Negative for rash.  Neurological: Negative for dizziness and headaches.  A complete 10 system review of systems was obtained and all systems are negative except as noted in the HPI and PMH.    Allergies  Review of patient's allergies indicates no known allergies.  Home Medications   Current Outpatient Rx  Name  Route  Sig  Dispense  Refill  . albuterol (PROVENTIL) (5 MG/ML) 0.5% nebulizer solution   Nebulization   Take 0.5 mLs (2.5 mg total) by nebulization every 6 (six) hours.   260 mL   6   . aspirin EC 81 MG tablet   Oral   Take 81 mg by mouth daily.         . cyproheptadine (PERIACTIN) 4 MG tablet   Oral   Take 4 mg by mouth every  evening.         . Fluticasone-Salmeterol (ADVAIR) 250-50 MCG/DOSE AEPB   Inhalation   Inhale 1 puff into the lungs every 12 (twelve) hours.         Marland Kitchen ipratropium (ATROVENT) 0.02 % nebulizer solution   Nebulization   Take 2.5 mLs (0.5 mg total) by nebulization every 6 (six) hours.   260 mL   6   . levothyroxine (SYNTHROID, LEVOTHROID) 100 MCG tablet   Oral   Take 100 mcg by mouth daily before breakfast.         . Lidocaine, Anorectal, 5 % CREA      Apply to anus as needed  for pain relief.   45 g   0   . linagliptin (TRADJENTA) 5 MG TABS tablet   Tube   Give 5 mg by tube daily.          . mirtazapine (REMERON) 30 MG tablet   Oral   Take 30 mg by mouth at bedtime.         . Nutritional Supplements (FEEDING SUPPLEMENT, OSMOLITE 1.5 CAL,) LIQD   Per Tube   Place 1,000 mLs into feeding tube continuous.         . polyethylene glycol (MIRALAX / GLYCOLAX) packet      Take 17 g mixed with 8 ounces of water through G-tube daily until your bowels are moving well.         . sodium phosphate (FLEET) enema      Administer one enema daily until bowels are moving well. Follow package instructions.         . cephALEXin (KEFLEX) 500 MG capsule   Oral   Take 1 capsule (500 mg total) by mouth 4 (four) times daily.   40 capsule   0   . HYDROcodone-acetaminophen (NORCO/VICODIN) 5-325 MG per tablet   Oral   Take 2 tablets by mouth every 4 (four) hours as needed for pain.   10 tablet   0   . sulfamethoxazole-trimethoprim (SEPTRA DS) 800-160 MG per tablet   Oral   Take 1 tablet by mouth 2 (two) times daily.   28 tablet   0    BP 142/92  Pulse 48  Temp(Src) 98.6 F (37 C) (Oral)  Resp 13  SpO2 95% Physical Exam  Constitutional: He is oriented to person, place, and time. He appears well-developed and well-nourished. No distress.  HENT:  Head: Normocephalic and atraumatic.  Mouth/Throat: Oropharynx is clear and moist. No oropharyngeal exudate.  Eyes: Conjunctivae and EOM are normal. Pupils are equal, round, and reactive to light.  Neck: Normal range of motion. Neck supple.  Cardiovascular: Normal rate, regular rhythm and normal heart sounds.   No murmur heard. Pulmonary/Chest: Effort normal and breath sounds normal. No respiratory distress.  Abdominal: Soft. There is tenderness. There is no rebound and no guarding.  GJ tube in place with surrounding erythema. Some serous drainage. No purulence or fluctuance. Abdomen otherwise soft and  nontender   Musculoskeletal: Normal range of motion. He exhibits no edema and no tenderness.  Neurological: He is alert and oriented to person, place, and time. No cranial nerve deficit. He exhibits normal muscle tone. Coordination normal.  Skin: Skin is warm.    ED Course  Procedures (including critical care time) Labs Reviewed  COMPREHENSIVE METABOLIC PANEL - Abnormal; Notable for the following:    Glucose, Bld 145 (*)    BUN 24 (*)    Total Protein 8.6 (*)  Albumin 3.4 (*)    GFR calc non Af Amer 87 (*)    All other components within normal limits  PROTIME-INR - Abnormal; Notable for the following:    Prothrombin Time 15.8 (*)    All other components within normal limits  CBC WITH DIFFERENTIAL  TROPONIN I  URINALYSIS, ROUTINE W REFLEX MICROSCOPIC   Dg Abd 1 View  07/11/2013   *RADIOLOGY REPORT*  Clinical Data: Pain and leakage at the site of entry of a PEG tube.  ABDOMEN - 1 VIEW  Comparison: Abdomen and pelvis CT dated 04/29/2013.  Findings: Again demonstrated is a percutaneous gastrojejunostomy tube with the inflated balloon in the expected position of the distal stomach.  Injected contrast is in the jejunum in the right mid and lower abdomen.  No contrast extravasation is seen.  Right upper abdominal surgical clips.  Normal bowel gas pattern. Prostate calcifications.  Lumbar spine degenerative changes.  IMPRESSION: Satisfactory position of the percutaneous gastrojejunostomy tube.   Original Report Authenticated By: Beckie Salts, M.D.   1. Abdominal wall cellulitis     MDM  Pain and itching at GJ tube site x 2 days.  No fever, chills, vomiting, diarrhea. Has been afraid to try to use tube. There is mild erythema at the GJ site with serous drainage. There is no fluctuance or purulence. Abdomen is soft and nontender without peritoneal signs. Afebrile, no leukocytosis.  GJ tube is in appropriate position on imaging. It is flushing normally.  Mild surrounding erythema to the  abdominal wall. Will start antibiotics for cellulitis. Followup with gastroenterologist at [facility] this week. Return to the ED with worsening pain, fever, vomiting or any other concerns.   Glynn Octave, MD 07/11/13 [phone]

## 2013-07-11 NOTE — ED Notes (Signed)
Was given a urinal

## 2013-09-11 ENCOUNTER — Other Ambulatory Visit (HOSPITAL_COMMUNITY): Payer: Self-pay | Admitting: Interventional Radiology

## 2013-09-11 ENCOUNTER — Ambulatory Visit (HOSPITAL_COMMUNITY)
Admission: RE | Admit: 2013-09-11 | Discharge: 2013-09-11 | Disposition: A | Discharge status: Home / Self Care | Payer: Medicare Other | Source: Ambulatory Visit | Attending: Interventional Radiology | Admitting: Interventional Radiology

## 2013-09-11 DIAGNOSIS — R633 Feeding difficulties: Secondary | ICD-10-CM

## 2013-09-11 DIAGNOSIS — K9423 Gastrostomy malfunction: Secondary | ICD-10-CM | POA: Insufficient documentation

## 2013-09-14 ENCOUNTER — Ambulatory Visit (HOSPITAL_COMMUNITY)
Admission: RE | Admit: 2013-09-14 | Discharge: 2013-09-14 | Disposition: A | Discharge status: Home / Self Care | Payer: Medicare Other | Source: Ambulatory Visit | Attending: Interventional Radiology | Admitting: Interventional Radiology

## 2013-09-14 ENCOUNTER — Other Ambulatory Visit (HOSPITAL_COMMUNITY): Payer: Self-pay | Admitting: Radiology

## 2013-09-14 DIAGNOSIS — R633 Feeding difficulties: Secondary | ICD-10-CM

## 2013-09-18 ENCOUNTER — Ambulatory Visit (HOSPITAL_COMMUNITY)
Admission: RE | Admit: 2013-09-18 | Discharge: 2013-09-18 | Disposition: A | Discharge status: Home / Self Care | Payer: Medicare Other | Source: Ambulatory Visit | Attending: Interventional Radiology | Admitting: Interventional Radiology

## 2013-09-18 ENCOUNTER — Encounter (HOSPITAL_BASED_OUTPATIENT_CLINIC_OR_DEPARTMENT_OTHER): Payer: Medicare Other | Attending: General Surgery

## 2013-09-18 DIAGNOSIS — Y833 Surgical operation with formation of external stoma as the cause of abnormal reaction of the patient, or of later complication, without mention of misadventure at the time of the procedure: Secondary | ICD-10-CM | POA: Insufficient documentation

## 2013-09-18 DIAGNOSIS — I10 Essential (primary) hypertension: Secondary | ICD-10-CM | POA: Insufficient documentation

## 2013-09-18 DIAGNOSIS — E119 Type 2 diabetes mellitus without complications: Secondary | ICD-10-CM | POA: Insufficient documentation

## 2013-09-18 DIAGNOSIS — L988 Other specified disorders of the skin and subcutaneous tissue: Secondary | ICD-10-CM | POA: Insufficient documentation

## 2013-09-18 DIAGNOSIS — Z8505 Personal history of malignant neoplasm of liver: Secondary | ICD-10-CM | POA: Insufficient documentation

## 2013-09-18 DIAGNOSIS — K9423 Gastrostomy malfunction: Secondary | ICD-10-CM | POA: Insufficient documentation

## 2013-09-18 NOTE — Consult Note (Signed)
WOC consult Note Reason for Consult:IR PA has noted that patient would benefit from a wound consult for his chronic and non-healing wound at the PEG tube insertion site.  WOC Nurses are inpatient based and occasionally see patients in diagnostic testing areas, but at this point, patient has been transferred to the outpatient [facility] for a consultation. Patient not seen.  PA to be notified as this Clinical research associate has spoken with IR staff member who will let him know. WOC nursing team will not follow, but will remain available to this patient, the nursing and medical teams.  Please re-consult if needed. Thanks, Ladona Mow, MSN, RN, GNP, [place], CWON-AP [phone])

## 2013-09-18 NOTE — Progress Notes (Signed)
The patient presented today for pain around GJ tube site. He states the leaking has improved, however the redness and pain around the site is getting worse. The patient's GJ tube site was examined revealing closed erythematous skin 2 inches peripherally around site. D/w Dr. Fredia Sorrow and it was felt that the patient would benefit from wound consult. At this time given the patient's decrease in leakage around site, there is no need for exchange. The patient was sent to wound care directly from IR and was instructed to call with any further questions.   Pattricia Boss PA-C Interventional Radiology  09/18/13  11:21 AM

## 2013-09-19 NOTE — Progress Notes (Signed)
[facility]  NAME:  [name], BUCKLE NO.:  [id number]  MEDICAL RECORD NO.:  [id number]      DATE OF BIRTH:  [birth year]  PHYSICIAN:  Ardath Sax, M.D.      VISIT DATE:  09/18/2013                                  OFFICE VISIT   This is a 79 year old gentleman, who because of aspiration pneumonia and inability to thrive, has a gastric tube placed in the left upper quadrant.  This is necessary for his feedings and resulting nutrition. He also has a history of type 2 diabetes, hypertension, cerebrovascular disease, and hepatocellular carcinoma.  He comes to this clinic because he has very irritated erythematous skin around the feeding tube.  I felt like all we could do really is put Desitin ointment around it and tighten up the slide of the_ gastric tube to keep it flushed.  After we put on an ointment, we put some 4x4s in between the slideand the skin__ in the skin, and I felt it was tight enough to help stop the leaking gastric juice that is irritating the skin.  We explained this to the companion with him and they will bring him back p.r.n.  His vital signs when he was here, he had a blood pressure of 130/70, respirations 18, pulse 70, temperature 97.8.  He weighs 120 pounds.  His blood glucose was 127.  He is on many medicines including Proventil, Advair, Norco, Atrovent, Synthroid, Remeron, MiraLax, and Bactrim, so we left him on all of those medicines with explanation to the party with the patient how to take care of his tube.     Ardath Sax, M.D.     PP/MEDQ  D:  09/18/2013  T:  09/19/2013  Job:  [id number]

## 2013-09-25 ENCOUNTER — Other Ambulatory Visit (HOSPITAL_COMMUNITY): Payer: Self-pay | Admitting: Interventional Radiology

## 2013-09-25 ENCOUNTER — Ambulatory Visit (HOSPITAL_COMMUNITY)
Admission: RE | Admit: 2013-09-25 | Discharge: 2013-09-25 | Disposition: A | Discharge status: Home / Self Care | Payer: Medicare Other | Source: Ambulatory Visit | Attending: Interventional Radiology | Admitting: Interventional Radiology

## 2013-09-25 DIAGNOSIS — R633 Feeding difficulties: Secondary | ICD-10-CM

## 2013-09-25 DIAGNOSIS — K9403 Colostomy malfunction: Secondary | ICD-10-CM | POA: Insufficient documentation

## 2013-09-25 DIAGNOSIS — K9413 Enterostomy malfunction: Secondary | ICD-10-CM | POA: Insufficient documentation

## 2013-09-25 MED ORDER — IOHEXOL 300 MG/ML  SOLN
10.0000 mL | Freq: Once | INTRAMUSCULAR | Status: AC | PRN
  Administered 2013-09-25: 10 mL

## 2013-09-30 ENCOUNTER — Other Ambulatory Visit (HOSPITAL_COMMUNITY): Payer: Self-pay | Admitting: Diagnostic Radiology

## 2013-09-30 DIAGNOSIS — Z931 Gastrostomy status: Secondary | ICD-10-CM

## 2013-10-01 ENCOUNTER — Other Ambulatory Visit (HOSPITAL_COMMUNITY): Payer: Self-pay | Admitting: Diagnostic Radiology

## 2013-10-01 ENCOUNTER — Emergency Department (HOSPITAL_COMMUNITY): Payer: Medicare Other

## 2013-10-01 ENCOUNTER — Emergency Department (HOSPITAL_COMMUNITY)
Admission: EM | Admit: 2013-10-01 | Discharge: 2013-10-01 | Disposition: A | Discharge status: Home / Self Care | Payer: Medicare Other | Attending: Emergency Medicine | Admitting: Emergency Medicine

## 2013-10-01 ENCOUNTER — Ambulatory Visit (HOSPITAL_COMMUNITY)
Admission: RE | Admit: 2013-10-01 | Discharge: 2013-10-01 | Disposition: A | Discharge status: Home / Self Care | Payer: Medicare Other | Source: Ambulatory Visit | Attending: Diagnostic Radiology | Admitting: Diagnostic Radiology

## 2013-10-01 ENCOUNTER — Other Ambulatory Visit: Payer: Self-pay

## 2013-10-01 DIAGNOSIS — K9429 Other complications of gastrostomy: Secondary | ICD-10-CM

## 2013-10-01 DIAGNOSIS — Y833 Surgical operation with formation of external stoma as the cause of abnormal reaction of the patient, or of later complication, without mention of misadventure at the time of the procedure: Secondary | ICD-10-CM | POA: Insufficient documentation

## 2013-10-01 DIAGNOSIS — N390 Urinary tract infection, site not specified: Secondary | ICD-10-CM | POA: Insufficient documentation

## 2013-10-01 DIAGNOSIS — Z7982 Long term (current) use of aspirin: Secondary | ICD-10-CM | POA: Insufficient documentation

## 2013-10-01 DIAGNOSIS — Z931 Gastrostomy status: Secondary | ICD-10-CM

## 2013-10-01 DIAGNOSIS — I10 Essential (primary) hypertension: Secondary | ICD-10-CM | POA: Insufficient documentation

## 2013-10-01 DIAGNOSIS — R112 Nausea with vomiting, unspecified: Secondary | ICD-10-CM | POA: Insufficient documentation

## 2013-10-01 DIAGNOSIS — Z79899 Other long term (current) drug therapy: Secondary | ICD-10-CM | POA: Insufficient documentation

## 2013-10-01 DIAGNOSIS — Z8505 Personal history of malignant neoplasm of liver: Secondary | ICD-10-CM | POA: Insufficient documentation

## 2013-10-01 DIAGNOSIS — R5381 Other malaise: Secondary | ICD-10-CM | POA: Insufficient documentation

## 2013-10-01 DIAGNOSIS — E119 Type 2 diabetes mellitus without complications: Secondary | ICD-10-CM | POA: Insufficient documentation

## 2013-10-01 DIAGNOSIS — Z8701 Personal history of pneumonia (recurrent): Secondary | ICD-10-CM | POA: Insufficient documentation

## 2013-10-01 DIAGNOSIS — Z792 Long term (current) use of antibiotics: Secondary | ICD-10-CM | POA: Insufficient documentation

## 2013-10-01 DIAGNOSIS — Z87891 Personal history of nicotine dependence: Secondary | ICD-10-CM | POA: Insufficient documentation

## 2013-10-01 DIAGNOSIS — R109 Unspecified abdominal pain: Secondary | ICD-10-CM | POA: Insufficient documentation

## 2013-10-01 DIAGNOSIS — Z862 Personal history of diseases of the blood and blood-forming organs and certain disorders involving the immune mechanism: Secondary | ICD-10-CM | POA: Insufficient documentation

## 2013-10-01 DIAGNOSIS — K9423 Gastrostomy malfunction: Secondary | ICD-10-CM | POA: Insufficient documentation

## 2013-10-01 LAB — CBC WITH DIFFERENTIAL/PLATELET
Basophils Absolute: 0 10*3/uL (ref 0.0–0.1)
Eosinophils Relative: 2 % (ref 0–5)
Lymphocytes Relative: 18 % (ref 12–46)
Lymphs Abs: 1.2 10*3/uL (ref 0.7–4.0)
MCHC: 34.1 g/dL (ref 30.0–36.0)
MCV: 93.1 fL (ref 78.0–100.0)
Neutrophils Relative %: 70 % (ref 43–77)
Platelets: 150 10*3/uL (ref 150–400)
RBC: 4.95 MIL/uL (ref 4.22–5.81)
RDW: 14.2 % (ref 11.5–15.5)
WBC: 6.8 10*3/uL (ref 4.0–10.5)

## 2013-10-01 LAB — COMPREHENSIVE METABOLIC PANEL
ALT: 16 U/L (ref 0–53)
AST: 32 U/L (ref 0–37)
Alkaline Phosphatase: 108 U/L (ref 39–117)
CO2: 32 mEq/L (ref 19–32)
Calcium: 10.3 mg/dL (ref 8.4–10.5)
GFR calc Af Amer: >90 mL/min (ref >90)
GFR calc non Af Amer: 80 mL/min — ABNORMAL LOW (ref >90)
Potassium: 4.1 mEq/L (ref 3.5–5.1)
Sodium: 142 mEq/L (ref 135–145)
Total Bilirubin: 1.1 mg/dL (ref 0.3–1.2)
Total Protein: 9.2 g/dL — ABNORMAL HIGH (ref 6.0–8.3)

## 2013-10-01 LAB — URINALYSIS, ROUTINE W REFLEX MICROSCOPIC
Bilirubin Urine: NEGATIVE
Hgb urine dipstick: NEGATIVE
Ketones, ur: NEGATIVE mg/dL
Nitrite: NEGATIVE
Protein, ur: 30 mg/dL — AB
Specific Gravity, Urine: 1.029 (ref 1.005–1.030)
pH: 5.5 (ref 5.0–8.0)

## 2013-10-01 LAB — URINE MICROSCOPIC-ADD ON

## 2013-10-01 MED ORDER — IOHEXOL 300 MG/ML  SOLN
10.0000 mL | Freq: Once | INTRAMUSCULAR | Status: AC | PRN
  Administered 2013-10-01: 10 mL

## 2013-10-01 MED ORDER — OXYCODONE-ACETAMINOPHEN 5-325 MG PO TABS
1.0000 | ORAL_TABLET | Freq: Once | ORAL | Status: AC
  Administered 2013-10-01: 1 via ORAL
  Filled 2013-10-01: qty 1

## 2013-10-01 MED ORDER — CIPROFLOXACIN HCL 500 MG PO TABS: 500.0000 mg | ORAL_TABLET | Freq: Two times a day (BID) | ORAL | Status: DC

## 2013-10-01 MED ORDER — CIPROFLOXACIN HCL 500 MG PO TABS
500.0000 mg | ORAL_TABLET | Freq: Once | ORAL | Status: AC
  Administered 2013-10-01: 500 mg via ORAL
  Filled 2013-10-01: qty 1

## 2013-10-01 NOTE — ED Notes (Signed)
Bed: WA03 Expected date:  Expected time:  Means of arrival:  Comments: IR pt

## 2013-10-01 NOTE — ED Notes (Signed)
Pt coming over from IR. md wants pt to be evaluated for failure to thrive.

## 2013-10-01 NOTE — ED Notes (Signed)
MD at bedside. Dr. Belfi at bedside.  

## 2013-10-01 NOTE — ED Provider Notes (Signed)
CSN: [id number]     Arrival date & time 10/01/13  1634 History   First MD Initiated Contact with Patient 10/01/13 1638     Chief Complaint  Patient presents with  . Failure To Thrive   (Consider location/radiation/quality/duration/timing/severity/associated sxs/prior Treatment) HPI Comments: Patient was sent over here from interventional radiology with a leaking GJ tube. Patient has had a tumor in place for about a year and did have been leaking so about a week ago patient had the tube changed out to a larger size tube. He continues to leak bowel contents around the tube. He's had some skin breakdown and leakage. He has some chronic pain in the area. He has an appointment to see the wound care clinic in about a week. He went to the interventional radiologist today to have the tube checked. I spoke with Dr. Lowella Dandy who states that there's not much more we can do for the leakage. It seems to improve when the patient's more upright. At this time they do not want to put a larger tube in. They have tacked it down to try to minimize some of the leakage. He states that the family was uncomfortable taking the patient home so they sent him over here for further evaluation. Patient has been more fatigued over the last week and has not been as active as normal. He's not had a known fevers. There is no chest pain or shortness of breath. He denies any abdominal pain other than the tenderness around the GJ tube site. He does have a history of constipation but had a bowel movement yesterday. He had one episode of vomiting yesterday but no ongoing nausea or vomiting. He has the tube in place due to to chronic aspiration.   Past Medical History  Diagnosis Date  . DM2 (diabetes mellitus, type 2)   . Hyperlipidemia   . Hypertension   . Cerebral vascular disease   . HCC (hepatocellular carcinoma)   . Blood dyscrasia     THROMBCYTOPATHIA  . Peripheral vascular disease   . Pneumonia 11/03/2012   Past Surgical History   Procedure Laterality Date  . Carotid stent  2002  . Liver canc    . Posterior cervical fusion/foraminotomy  09/25/2012    Procedure: POSTERIOR CERVICAL FUSION/FORAMINOTOMY LEVEL 2;  Surgeon: Tia Alert, MD;  Location: [facility];  Service: Neurosurgery;  Laterality: N/A;  Posterior Cervical three-four laminectomy, posterior cervical three-four, four-five fusion  . Esophagogastroduodenoscopy  10/01/2012    Procedure: ESOPHAGOGASTRODUODENOSCOPY (EGD);  Surgeon: Cherylynn Ridges, MD;  Location: [facility] ENDOSCOPY;  Service: General;  Laterality: N/A;  wyatt/leone  . Peg placement  10/01/2012    Procedure: PERCUTANEOUS ENDOSCOPIC GASTROSTOMY (PEG) PLACEMENT;  Surgeon: Cherylynn Ridges, MD;  Location: [facility] ENDOSCOPY;  Service: General;  Laterality: N/A;  . Esophagogastroduodenoscopy  10/31/2012    Procedure: ESOPHAGOGASTRODUODENOSCOPY (EGD);  Surgeon: Theda Belfast, MD;  Location: [facility] ENDOSCOPY;  Service: Endoscopy;  Laterality: N/A;   No family history on file. History  Substance Use Topics  . Smoking status: Former Smoker -- 1.00 packs/day for 50 years    Types: Cigarettes    Quit date: 01/01/2012  . Smokeless tobacco: Never Used  . Alcohol Use: 1.2 oz/week    2 Cans of beer per week     Comment: daily    Review of Systems  Constitutional: Positive for fatigue. Negative for fever, chills and diaphoresis.  HENT: Negative for congestion, rhinorrhea and sneezing.   Eyes: Negative.   Respiratory: Negative  for cough, chest tightness and shortness of breath.   Cardiovascular: Negative for chest pain and leg swelling.  Gastrointestinal: Positive for nausea, vomiting and abdominal pain. Negative for diarrhea and blood in stool.  Genitourinary: Negative for frequency, hematuria, flank pain and difficulty urinating.  Musculoskeletal: Negative for back pain and arthralgias.  Skin: Negative for rash.  Neurological: Negative for dizziness, speech difficulty, weakness, numbness and headaches.    Allergies   Review of patient's allergies indicates no known allergies.  Home Medications   Current Outpatient Rx  Name  Route  Sig  Dispense  Refill  . albuterol (PROVENTIL) (5 MG/ML) 0.5% nebulizer solution   Nebulization   Take 0.5 mLs (2.5 mg total) by nebulization every 6 (six) hours.   260 mL   6   . aspirin EC 81 MG tablet   Oral   Take 81 mg by mouth daily.         . cyproheptadine (PERIACTIN) 4 MG tablet   Oral   Take 4 mg by mouth every evening.         . Fluticasone-Salmeterol (ADVAIR) 250-50 MCG/DOSE AEPB   Inhalation   Inhale 1 puff into the lungs every 12 (twelve) hours.         Marland Kitchen ipratropium (ATROVENT) 0.02 % nebulizer solution   Nebulization   Take 2.5 mLs (0.5 mg total) by nebulization every 6 (six) hours.   260 mL   6   . levothyroxine (SYNTHROID, LEVOTHROID) 88 MCG tablet   Oral   Take 88 mcg by mouth daily before breakfast.         . Lidocaine, Anorectal, 5 % CREA      Apply to anus as needed for pain relief.   45 g   0   . linagliptin (TRADJENTA) 5 MG TABS tablet   Oral   Take 5 mg by mouth daily.          . mirtazapine (REMERON) 30 MG tablet   Oral   Take 30 mg by mouth at bedtime.         . Nutritional Supplements (FEEDING SUPPLEMENT, OSMOLITE 1.5 CAL,) LIQD   Per Tube   Place 1,000 mLs into feeding tube continuous.         Marland Kitchen oxyCODONE (OXY IR/ROXICODONE) 5 MG immediate release tablet   Oral   Take 10 mg by mouth every 4 (four) hours as needed for pain.         Marland Kitchen oxyCODONE-acetaminophen (PERCOCET/ROXICET) 5-325 MG per tablet   Oral   Take 1 tablet by mouth every 4 (four) hours as needed for pain.         . ciprofloxacin (CIPRO) 500 MG tablet   Oral   Take 1 tablet (500 mg total) by mouth 2 (two) times daily. One po bid x 7 days   14 tablet   0    BP 129/51  Pulse 69  Temp(Src) 97.9 F (36.6 C) (Oral)  Resp 16  SpO2 100% Physical Exam  Constitutional: He is oriented to person, place, and time. He appears  well-developed and well-nourished.  HENT:  Head: Normocephalic and atraumatic.  Eyes: Pupils are equal, round, and reactive to light.  Neck: Normal range of motion. Neck supple.  Cardiovascular: Normal rate, regular rhythm and normal heart sounds.   Pulmonary/Chest: Effort normal and breath sounds normal. No respiratory distress. He has no wheezes. He has no rales. He exhibits no tenderness.  Abdominal: Soft. Bowel sounds are normal. There is tenderness (  Patient has mild erythema and some skin breakdown around the GJ tube site. There's some tenderness around this area. There is no evidence of cellulitis. It appears to be more of irritation. There is some leakage of thin yellow fluid around the tube.). There is no rebound and no guarding.  Musculoskeletal: Normal range of motion. He exhibits no edema.  Lymphadenopathy:    He has no cervical adenopathy.  Neurological: He is alert and oriented to person, place, and time. No cranial nerve deficit.  Moves all extremities symmetrically  Skin: Skin is warm and dry. No rash noted.  Psychiatric: He has a normal mood and affect.    ED Course  Procedures (including critical care time) Labs Review Labs Reviewed  COMPREHENSIVE METABOLIC PANEL - Abnormal; Notable for the following:    Glucose, Bld 121 (*)    BUN 42 (*)    Total Protein 9.2 (*)    GFR calc non Af Amer 80 (*)    All other components within normal limits  URINALYSIS, ROUTINE W REFLEX MICROSCOPIC - Abnormal; Notable for the following:    Color, Urine AMBER (*)    APPearance CLOUDY (*)    Glucose, UA 100 (*)    Protein, ur 30 (*)    Urobilinogen, UA 2.0 (*)    Leukocytes, UA MODERATE (*)    All other components within normal limits  URINE MICROSCOPIC-ADD ON - Abnormal; Notable for the following:    Casts HYALINE CASTS (*)    All other components within normal limits  CBC WITH DIFFERENTIAL   Imaging Review Ir Fluoro Rm 30-60 Min  10/01/2013   *RADIOLOGY REPORT*  Clinical Data:  Evaluate feeding tube placement.  IR FLOURO RM 0-60 MIN  Fluoro time:  6 seconds  Comparison: 09/25/2013  Findings: Single fluoroscopic image demonstrates that the gastrojejunal feeding tube is in the same position as it was during catheter placement.  The tube appears to be within the small bowel. There is contrast within the retention balloon.  IMPRESSION: Stable position of the gastrojejunostomy tube.   Original Report Authenticated By: Richarda Overlie, M.D.   Ir Radiologist Eval & Mgmt  10/01/2013   *RADIOLOGY REPORT*  Clinical Data: Patient with history of persistent leaking around recently placed 20-French jejunostomy tube.  He presents today for inspection of site with possible intervention.  History/Findings: On exam today the patient's jejunostomy tube is intact.  There is a considerable amount of leaking of fluid around the insertion site.  The patient states that this has occurred since the tube was exchanged on 09/25/2013.  The patient states that he has been using the tube for feeds.  In addition, there is erythema with mild skin breakdown at the insertion site.  The patient was instructed in the past to place desitin with zinc oxide on the area.  He has noticed some improvement at the site since use of desitin. The jejunostomy tube was able to be flushed without significant resistance.  Of note, when the patient is placed in the right lateral decubitus position there is minimal leaking from the jejunostomy  site.  Findings on a plain abdominal film revealed that the tube is in proper position.  IMPRESSION/PLAN : Patient status post replacement and upsizing of 20-French jejunostomy tube with tip terminating within the proximal small bowel on 09/25/2013;  now with persistent leaking at insertion site with intermittent nausea.  Also with erythema /mild skin breakdown at insertion site.   Based on patient's current clinical status and after discussion  of various options for care, decision was made to have the  patient be evaluated in the emergency room with possible admission and wound care consult for jejunostomy insertion site. Decision to upsize current jejunostomy tube was deferred this time secondary to potential for worsening leaking at the insertion site. The patient was also seen and examined by Dr. Richarda Overlie.  Read by: Jeananne Rama, P.A.-C   Original Report Authenticated By: Richarda Overlie, M.D.    Date: 10/01/2013  Rate: 66  Rhythm: atrial flutter  QRS Axis: left  Intervals: normal  ST/T Wave abnormalities: nonspecific ST/T changes  Conduction Disutrbances:right bundle branch block and left anterior fascicular block  Narrative Interpretation:   Old EKG Reviewed: unchanged     MDM   1. UTI (lower urinary tract infection)   2. Gastrostomy tube skin breakdown    Patient presents with leakage around his GJ tube. There is no evidence of infection. He was just seen by interventional radiology who felt that nothing more needed to be that at this point regarding his toe. He did have a study today that showed good placement of the tube. He otherwise is well-appearing. He does complain of pain around the area and there is some skin breakdown but he sitting up in bed and smiling and active. He's been complaining of some increased fatigue over the last week or so he has some evidence of urinary tract infection and was treated with Cipro for this. His urine was sent for culture. His other lab work is unremarkable. At this point I did not see any reason to admit him to the hospital. He has an appointment on October 8 with the wound care clinic. In the meantime he's been advised already by wound care she is Desitin to the area. I also advised him that interventional radiologist recommended that he remain more upright to help with leakage.    Rolan Bucco, MD 10/01/13 2122

## 2013-10-01 NOTE — ED Notes (Signed)
Dr. Fredderick Phenix aware that pt having pain. Dr. Fredderick Phenix requesting wound consult for pt. Secretary notified.

## 2013-10-01 NOTE — ED Notes (Signed)
Pt came from outpatient radiology. Pt has J tube, leaking around tube with some skin breakdown around insertion site. J tube confirmed in place. PA wanted pt checked out for failure to thrive.

## 2013-10-07 ENCOUNTER — Encounter (HOSPITAL_BASED_OUTPATIENT_CLINIC_OR_DEPARTMENT_OTHER): Payer: Medicare Other | Attending: General Surgery

## 2013-10-15 ENCOUNTER — Emergency Department (HOSPITAL_COMMUNITY): Payer: PRIVATE HEALTH INSURANCE

## 2013-10-15 ENCOUNTER — Encounter (HOSPITAL_COMMUNITY): Payer: Self-pay | Admitting: Emergency Medicine

## 2013-10-15 ENCOUNTER — Inpatient Hospital Stay (HOSPITAL_COMMUNITY)
Admission: EM | Admit: 2013-10-15 | Discharge: 2013-10-19 | DRG: 177 | Disposition: A | Discharge status: Home / Self Care | Payer: PRIVATE HEALTH INSURANCE | Attending: Family Medicine | Admitting: Family Medicine

## 2013-10-15 DIAGNOSIS — N179 Acute kidney failure, unspecified: Secondary | ICD-10-CM | POA: Diagnosis present

## 2013-10-15 DIAGNOSIS — E1142 Type 2 diabetes mellitus with diabetic polyneuropathy: Secondary | ICD-10-CM | POA: Diagnosis present

## 2013-10-15 DIAGNOSIS — E1149 Type 2 diabetes mellitus with other diabetic neurological complication: Secondary | ICD-10-CM | POA: Diagnosis present

## 2013-10-15 DIAGNOSIS — L02219 Cutaneous abscess of trunk, unspecified: Secondary | ICD-10-CM

## 2013-10-15 DIAGNOSIS — I739 Peripheral vascular disease, unspecified: Secondary | ICD-10-CM | POA: Diagnosis present

## 2013-10-15 DIAGNOSIS — I4892 Unspecified atrial flutter: Secondary | ICD-10-CM | POA: Diagnosis present

## 2013-10-15 DIAGNOSIS — C228 Malignant neoplasm of liver, primary, unspecified as to type: Secondary | ICD-10-CM | POA: Diagnosis present

## 2013-10-15 DIAGNOSIS — E119 Type 2 diabetes mellitus without complications: Secondary | ICD-10-CM

## 2013-10-15 DIAGNOSIS — L02818 Cutaneous abscess of other sites: Secondary | ICD-10-CM | POA: Diagnosis present

## 2013-10-15 DIAGNOSIS — Z79899 Other long term (current) drug therapy: Secondary | ICD-10-CM

## 2013-10-15 DIAGNOSIS — I10 Essential (primary) hypertension: Secondary | ICD-10-CM | POA: Diagnosis present

## 2013-10-15 DIAGNOSIS — J690 Pneumonitis due to inhalation of food and vomit: Principal | ICD-10-CM

## 2013-10-15 DIAGNOSIS — E039 Hypothyroidism, unspecified: Secondary | ICD-10-CM | POA: Diagnosis present

## 2013-10-15 DIAGNOSIS — R1310 Dysphagia, unspecified: Secondary | ICD-10-CM

## 2013-10-15 DIAGNOSIS — F3289 Other specified depressive episodes: Secondary | ICD-10-CM | POA: Diagnosis present

## 2013-10-15 DIAGNOSIS — J449 Chronic obstructive pulmonary disease, unspecified: Secondary | ICD-10-CM | POA: Diagnosis present

## 2013-10-15 DIAGNOSIS — I679 Cerebrovascular disease, unspecified: Secondary | ICD-10-CM | POA: Diagnosis present

## 2013-10-15 DIAGNOSIS — K9422 Gastrostomy infection: Secondary | ICD-10-CM

## 2013-10-15 DIAGNOSIS — R1312 Dysphagia, oropharyngeal phase: Secondary | ICD-10-CM | POA: Diagnosis present

## 2013-10-15 DIAGNOSIS — J4489 Other specified chronic obstructive pulmonary disease: Secondary | ICD-10-CM | POA: Diagnosis present

## 2013-10-15 DIAGNOSIS — Z8701 Personal history of pneumonia (recurrent): Secondary | ICD-10-CM

## 2013-10-15 DIAGNOSIS — E785 Hyperlipidemia, unspecified: Secondary | ICD-10-CM | POA: Diagnosis present

## 2013-10-15 DIAGNOSIS — E43 Unspecified severe protein-calorie malnutrition: Secondary | ICD-10-CM | POA: Diagnosis present

## 2013-10-15 DIAGNOSIS — F329 Major depressive disorder, single episode, unspecified: Secondary | ICD-10-CM | POA: Diagnosis present

## 2013-10-15 DIAGNOSIS — Z87891 Personal history of nicotine dependence: Secondary | ICD-10-CM

## 2013-10-15 DIAGNOSIS — Z931 Gastrostomy status: Secondary | ICD-10-CM

## 2013-10-15 DIAGNOSIS — D696 Thrombocytopenia, unspecified: Secondary | ICD-10-CM | POA: Diagnosis present

## 2013-10-15 DIAGNOSIS — G8929 Other chronic pain: Secondary | ICD-10-CM | POA: Diagnosis present

## 2013-10-15 DIAGNOSIS — Z681 Body mass index (BMI) 19 or less, adult: Secondary | ICD-10-CM

## 2013-10-15 DIAGNOSIS — J189 Pneumonia, unspecified organism: Secondary | ICD-10-CM

## 2013-10-15 LAB — GLUCOSE, CAPILLARY
Glucose-Capillary: 150 mg/dL — ABNORMAL HIGH (ref 70–99)
Glucose-Capillary: 222 mg/dL — ABNORMAL HIGH (ref 70–99)

## 2013-10-15 LAB — COMPREHENSIVE METABOLIC PANEL
ALT: 15 U/L (ref 0–53)
AST: 31 U/L (ref 0–37)
Alkaline Phosphatase: 109 U/L (ref 39–117)
BUN: 24 mg/dL — ABNORMAL HIGH (ref 6–23)
CO2: 26 mEq/L (ref 19–32)
Calcium: 9 mg/dL (ref 8.4–10.5)
Chloride: 98 mEq/L (ref 96–112)
GFR calc Af Amer: >90 mL/min (ref >90)
GFR calc non Af Amer: >90 mL/min (ref >90)
Potassium: 3.4 mEq/L — ABNORMAL LOW (ref 3.5–5.1)
Sodium: 135 mEq/L (ref 135–145)

## 2013-10-15 LAB — CBC WITH DIFFERENTIAL/PLATELET
Basophils Absolute: 0 10*3/uL (ref 0.0–0.1)
Basophils Relative: 0 % (ref 0–1)
Eosinophils Relative: 0 % (ref 0–5)
Lymphocytes Relative: 9 % — ABNORMAL LOW (ref 12–46)
MCH: 32.1 pg (ref 26.0–34.0)
Neutro Abs: 7.9 10*3/uL — ABNORMAL HIGH (ref 1.7–7.7)
Neutrophils Relative %: 81 % — ABNORMAL HIGH (ref 43–77)
Platelets: 122 10*3/uL — ABNORMAL LOW (ref 150–400)
RBC: 4.36 MIL/uL (ref 4.22–5.81)
RDW: 14.1 % (ref 11.5–15.5)
WBC: 9.8 10*3/uL (ref 4.0–10.5)

## 2013-10-15 LAB — URINALYSIS, ROUTINE W REFLEX MICROSCOPIC
Bilirubin Urine: NEGATIVE
Glucose, UA: 100 mg/dL — AB
Ketones, ur: NEGATIVE mg/dL
Nitrite: NEGATIVE
pH: 8 (ref 5.0–8.0)

## 2013-10-15 LAB — CG4 I-STAT (LACTIC ACID): Lactic Acid, Venous: 1.56 mmol/L (ref 0.5–2.2)

## 2013-10-15 LAB — CBC
HCT: 34.9 % — ABNORMAL LOW (ref 39.0–52.0)
Hemoglobin: 11.9 g/dL — ABNORMAL LOW (ref 13.0–17.0)
MCH: 31.2 pg (ref 26.0–34.0)
MCHC: 34.1 g/dL (ref 30.0–36.0)
RBC: 3.82 MIL/uL — ABNORMAL LOW (ref 4.22–5.81)

## 2013-10-15 LAB — CREATININE, SERUM: GFR calc non Af Amer: 86 mL/min — ABNORMAL LOW (ref >90)

## 2013-10-15 LAB — URINE MICROSCOPIC-ADD ON

## 2013-10-15 MED ORDER — OSMOLITE 1.5 CAL PO LIQD
1000.0000 mL | ORAL | Status: DC
  Administered 2013-10-15: 1000 mL
  Filled 2013-10-15: qty 1000

## 2013-10-15 MED ORDER — HEPARIN SODIUM (PORCINE) 5000 UNIT/ML IJ SOLN
5000.0000 [IU] | Freq: Three times a day (TID) | INTRAMUSCULAR | Status: DC
  Administered 2013-10-15 – 2013-10-17 (×6): 5000 [IU] via SUBCUTANEOUS
  Filled 2013-10-15 (×10): qty 1

## 2013-10-15 MED ORDER — OXYCODONE-ACETAMINOPHEN 5-325 MG PO TABS
1.0000 | ORAL_TABLET | ORAL | Status: DC | PRN
  Administered 2013-10-18: 1 via ORAL
  Filled 2013-10-15: qty 1

## 2013-10-15 MED ORDER — PIPERACILLIN-TAZOBACTAM 3.375 G IVPB 30 MIN
3.3750 g | Freq: Three times a day (TID) | INTRAVENOUS | Status: DC
  Filled 2013-10-15 (×3): qty 50

## 2013-10-15 MED ORDER — ALBUTEROL SULFATE (5 MG/ML) 0.5% IN NEBU
2.5000 mg | INHALATION_SOLUTION | Freq: Four times a day (QID) | RESPIRATORY_TRACT | Status: DC
  Administered 2013-10-15 – 2013-10-17 (×11): 2.5 mg via RESPIRATORY_TRACT
  Filled 2013-10-15 (×11): qty 0.5

## 2013-10-15 MED ORDER — PIPERACILLIN-TAZOBACTAM 3.375 G IVPB
3.3750 g | Freq: Once | INTRAVENOUS | Status: AC
  Administered 2013-10-15: 3.375 g via INTRAVENOUS
  Filled 2013-10-15: qty 50

## 2013-10-15 MED ORDER — ASPIRIN EC 81 MG PO TBEC
81.0000 mg | DELAYED_RELEASE_TABLET | Freq: Every day | ORAL | Status: DC
  Administered 2013-10-15 – 2013-10-19 (×5): 81 mg via ORAL
  Filled 2013-10-15 (×5): qty 1

## 2013-10-15 MED ORDER — VANCOMYCIN HCL IN DEXTROSE 1-5 GM/200ML-% IV SOLN
1000.0000 mg | Freq: Once | INTRAVENOUS | Status: AC
  Administered 2013-10-15: 1000 mg via INTRAVENOUS
  Filled 2013-10-15: qty 200

## 2013-10-15 MED ORDER — ACETAMINOPHEN 325 MG PO TABS
650.0000 mg | ORAL_TABLET | Freq: Four times a day (QID) | ORAL | Status: DC | PRN
  Administered 2013-10-15: 650 mg via ORAL
  Filled 2013-10-15: qty 2

## 2013-10-15 MED ORDER — LINAGLIPTIN 5 MG PO TABS
5.0000 mg | ORAL_TABLET | Freq: Every day | ORAL | Status: DC
  Administered 2013-10-15 – 2013-10-19 (×5): 5 mg via ORAL
  Filled 2013-10-15 (×5): qty 1

## 2013-10-15 MED ORDER — OXYCODONE HCL 5 MG PO TABS
10.0000 mg | ORAL_TABLET | ORAL | Status: DC | PRN
  Administered 2013-10-15: 10 mg via ORAL
  Filled 2013-10-15: qty 2

## 2013-10-15 MED ORDER — PIPERACILLIN-TAZOBACTAM 3.375 G IVPB
3.3750 g | Freq: Three times a day (TID) | INTRAVENOUS | Status: DC
  Administered 2013-10-15 – 2013-10-17 (×5): 3.375 g via INTRAVENOUS
  Filled 2013-10-15 (×7): qty 50

## 2013-10-15 MED ORDER — SODIUM CHLORIDE 0.9 % IV SOLN
INTRAVENOUS | Status: DC
  Administered 2013-10-15 – 2013-10-16 (×2): via INTRAVENOUS

## 2013-10-15 MED ORDER — VANCOMYCIN HCL 500 MG IV SOLR
500.0000 mg | Freq: Two times a day (BID) | INTRAVENOUS | Status: DC
  Administered 2013-10-15 – 2013-10-17 (×4): 500 mg via INTRAVENOUS
  Filled 2013-10-15 (×5): qty 500

## 2013-10-15 MED ORDER — MIRTAZAPINE 30 MG PO TABS
30.0000 mg | ORAL_TABLET | Freq: Every day | ORAL | Status: DC
  Administered 2013-10-15 – 2013-10-18 (×4): 30 mg via ORAL
  Filled 2013-10-15 (×5): qty 1

## 2013-10-15 MED ORDER — INSULIN ASPART 100 UNIT/ML ~~LOC~~ SOLN
0.0000 [IU] | SUBCUTANEOUS | Status: DC
  Administered 2013-10-15 – 2013-10-18 (×8): 3 [IU] via SUBCUTANEOUS
  Administered 2013-10-18: 5 [IU] via SUBCUTANEOUS
  Administered 2013-10-18: 3 [IU] via SUBCUTANEOUS
  Administered 2013-10-19: 2 [IU] via SUBCUTANEOUS
  Administered 2013-10-19: 5 [IU] via SUBCUTANEOUS
  Administered 2013-10-19: 2 [IU] via SUBCUTANEOUS

## 2013-10-15 MED ORDER — ONDANSETRON HCL 4 MG PO TABS: 4.0000 mg | ORAL_TABLET | Freq: Four times a day (QID) | ORAL | Status: DC | PRN

## 2013-10-15 MED ORDER — MOMETASONE FURO-FORMOTEROL FUM 100-5 MCG/ACT IN AERO
2.0000 | INHALATION_SPRAY | Freq: Two times a day (BID) | RESPIRATORY_TRACT | Status: DC
  Administered 2013-10-15 – 2013-10-19 (×9): 2 via RESPIRATORY_TRACT
  Filled 2013-10-15: qty 8.8

## 2013-10-15 MED ORDER — ONDANSETRON HCL 4 MG/2ML IJ SOLN: 4.0000 mg | Freq: Four times a day (QID) | INTRAMUSCULAR | Status: DC | PRN

## 2013-10-15 MED ORDER — LEVOTHYROXINE SODIUM 88 MCG PO TABS
88.0000 ug | ORAL_TABLET | Freq: Every day | ORAL | Status: DC
  Administered 2013-10-15 – 2013-10-19 (×5): 88 ug via ORAL
  Filled 2013-10-15 (×6): qty 1

## 2013-10-15 MED ORDER — OSMOLITE 1.5 CAL PO LIQD
1000.0000 mL | ORAL | Status: DC
  Administered 2013-10-15 – 2013-10-17 (×3): 1000 mL
  Filled 2013-10-15 (×5): qty 1000

## 2013-10-15 MED ORDER — IPRATROPIUM BROMIDE 0.02 % IN SOLN
0.5000 mg | Freq: Four times a day (QID) | RESPIRATORY_TRACT | Status: DC
  Administered 2013-10-15 – 2013-10-17 (×11): 0.5 mg via RESPIRATORY_TRACT
  Filled 2013-10-15 (×11): qty 2.5

## 2013-10-15 MED ORDER — SODIUM CHLORIDE 0.9 % IV BOLUS (SEPSIS)
1000.0000 mL | Freq: Once | INTRAVENOUS | Status: AC
  Administered 2013-10-15: 1000 mL via INTRAVENOUS

## 2013-10-15 NOTE — ED Notes (Signed)
Patient transported to X-ray 

## 2013-10-15 NOTE — ED Provider Notes (Signed)
CSN: [id number]     Arrival date & time 10/15/13  0059 History   First MD Initiated Contact with Patient 10/15/13 0134     Chief Complaint  Patient presents with  . Nausea  . Fever  . Emesis   (Consider location/radiation/quality/duration/timing/severity/associated sxs/prior Treatment) Patient is a 79 y.o. male presenting with fever and vomiting. The history is provided by the patient.  Fever Associated symptoms: vomiting   Emesis  patient here complaining of constant vomiting x2 days now with associated fever. Patient notes cough as well 2. Cough is nonproductive of green yellow sputum. No medications taken for his fever. Denies any diarrhea. Patient does have a feeding tube which does have some yellowish drainage around the site. Has been using topical treatment for this. Patient has been more dyspneic but denies any anginal type chest pain. No CHF type symptoms or orthopnea.  Past Medical History  Diagnosis Date  . DM2 (diabetes mellitus, type 2)   . Hyperlipidemia   . Hypertension   . Cerebral vascular disease   . HCC (hepatocellular carcinoma)   . Blood dyscrasia     THROMBCYTOPATHIA  . Peripheral vascular disease   . Pneumonia 11/03/2012   Past Surgical History  Procedure Laterality Date  . Carotid stent  2002  . Liver canc    . Posterior cervical fusion/foraminotomy  09/25/2012    Procedure: POSTERIOR CERVICAL FUSION/FORAMINOTOMY LEVEL 2;  Surgeon: Tia Alert, MD;  Location: [facility];  Service: Neurosurgery;  Laterality: N/A;  Posterior Cervical three-four laminectomy, posterior cervical three-four, four-five fusion  . Esophagogastroduodenoscopy  10/01/2012    Procedure: ESOPHAGOGASTRODUODENOSCOPY (EGD);  Surgeon: Cherylynn Ridges, MD;  Location: [facility] ENDOSCOPY;  Service: General;  Laterality: N/A;  wyatt/leone  . Peg placement  10/01/2012    Procedure: PERCUTANEOUS ENDOSCOPIC GASTROSTOMY (PEG) PLACEMENT;  Surgeon: Cherylynn Ridges, MD;  Location: [facility] ENDOSCOPY;  Service:  General;  Laterality: N/A;  . Esophagogastroduodenoscopy  10/31/2012    Procedure: ESOPHAGOGASTRODUODENOSCOPY (EGD);  Surgeon: Theda Belfast, MD;  Location: [facility] ENDOSCOPY;  Service: Endoscopy;  Laterality: N/A;   No family history on file. History  Substance Use Topics  . Smoking status: Former Smoker -- 1.00 packs/day for 50 years    Types: Cigarettes    Quit date: 01/01/2012  . Smokeless tobacco: Never Used  . Alcohol Use: 1.2 oz/week    2 Cans of beer per week     Comment: daily    Review of Systems  Constitutional: Positive for fever.  Gastrointestinal: Positive for vomiting.  All other systems reviewed and are negative.    Allergies  Review of patient's allergies indicates no known allergies.  Home Medications   Current Outpatient Rx  Name  Route  Sig  Dispense  Refill  . albuterol (PROVENTIL) (5 MG/ML) 0.5% nebulizer solution   Nebulization   Take 0.5 mLs (2.5 mg total) by nebulization every 6 (six) hours.   260 mL   6   . aspirin EC 81 MG tablet   Oral   Take 81 mg by mouth daily.         . cyproheptadine (PERIACTIN) 4 MG tablet   Oral   Take 4 mg by mouth every evening.         . Fluticasone-Salmeterol (ADVAIR) 250-50 MCG/DOSE AEPB   Inhalation   Inhale 1 puff into the lungs every 12 (twelve) hours.         Marland Kitchen ipratropium (ATROVENT) 0.02 % nebulizer solution   Nebulization  Take 2.5 mLs (0.5 mg total) by nebulization every 6 (six) hours.   260 mL   6   . levothyroxine (SYNTHROID, LEVOTHROID) 88 MCG tablet   Oral   Take 88 mcg by mouth daily before breakfast.         . linagliptin (TRADJENTA) 5 MG TABS tablet   Oral   Take 5 mg by mouth daily.          . mirtazapine (REMERON) 30 MG tablet   Oral   Take 30 mg by mouth at bedtime.         . Nutritional Supplements (FEEDING SUPPLEMENT, OSMOLITE 1.5 CAL,) LIQD   Per Tube   Place 1,000 mLs into feeding tube continuous.         Marland Kitchen oxyCODONE (OXY IR/ROXICODONE) 5 MG immediate release  tablet   Oral   Take 10 mg by mouth every 4 (four) hours as needed for pain.         Marland Kitchen oxyCODONE-acetaminophen (PERCOCET/ROXICET) 5-325 MG per tablet   Oral   Take 1 tablet by mouth every 4 (four) hours as needed for pain.          BP 146/63  Pulse 101  Temp(Src) 100.7 F (38.2 C) (Oral)  Resp 22  SpO2 100% Physical Exam  Nursing note and vitals reviewed. Constitutional: He is oriented to person, place, and time. He appears well-developed and well-nourished.  Non-toxic appearance. No distress.  HENT:  Head: Normocephalic and atraumatic.  Eyes: Conjunctivae, EOM and lids are normal. Pupils are equal, round, and reactive to light.  Neck: Normal range of motion. Neck supple. No tracheal deviation present. No mass present.  Cardiovascular: Normal rate, regular rhythm and normal heart sounds.  Exam reveals no gallop.   No murmur heard. Pulmonary/Chest: Effort normal and breath sounds normal. No stridor. No respiratory distress. He has no decreased breath sounds. He has no wheezes. He has no rhonchi. He has no rales.  Abdominal: Soft. Normal appearance and bowel sounds are normal. He exhibits no distension. There is no tenderness. There is no rebound and no CVA tenderness.    Musculoskeletal: Normal range of motion. He exhibits no edema and no tenderness.  Neurological: He is alert and oriented to person, place, and time. No cranial nerve deficit. GCS eye subscore is 4. GCS verbal subscore is 5. GCS motor subscore is 6.  Skin: Skin is warm and dry. No abrasion and no rash noted.  Psychiatric: He has a normal mood and affect. His speech is normal and behavior is normal.    ED Course  Procedures (including critical care time) Labs Review Labs Reviewed  CBC WITH DIFFERENTIAL - Abnormal; Notable for the following:    Platelets 122 (*)    Neutrophils Relative % 81 (*)    Neutro Abs 7.9 (*)    Lymphocytes Relative 9 (*)    All other components within normal limits  COMPREHENSIVE  METABOLIC PANEL - Abnormal; Notable for the following:    Potassium 3.4 (*)    Glucose, Bld 111 (*)    BUN 24 (*)    Albumin 3.2 (*)    All other components within normal limits  URINALYSIS, ROUTINE W REFLEX MICROSCOPIC - Abnormal; Notable for the following:    Glucose, UA 100 (*)    Protein, ur 30 (*)    Leukocytes, UA SMALL (*)    All other components within normal limits  CULTURE, BLOOD (ROUTINE X 2)  CULTURE, BLOOD (ROUTINE X 2)  URINE  CULTURE  URINE MICROSCOPIC-ADD ON  CG4 I-STAT (LACTIC ACID)  POCT LACTIC ACID (LACTATE)   Imaging Review Dg Chest 2 View  10/15/2013   CLINICAL DATA:  Chest pain and shortness of Breath  EXAM: CHEST  2 VIEW  COMPARISON:  Prior radiograph from 12/05/2012  FINDINGS: Cardiac and mediastinal silhouettes are stable in size and contour, and remain within normal limits.  The lungs are normally inflated. There is asymmetric patchy and linear opacities within the left lung base, which may reflect atelectasis or infiltrate. There is no pulmonary edema or pleural effusion. The right lung is clear.  Multilevel degenerative changes are present within the visualized spine. No acute osseous abnormality identified.  IMPRESSION: Patchy and linear opacities within the left lung base, new relative to the most recent examination from 12/05/2012. While these findings may represent atelectatic changes, possible infectious or aspiration pneumonitis could also have this appearance.   Electronically Signed   By: Rise Mu M.D.   On: 10/15/2013 02:12    EKG Interpretation   None       MDM  No diagnosis found. Patient with likely early pneumonia which is likely healthcare associated and patient to be placed on antibiotics for this. We'll consult tract hospitalist for admission    Toy Baker, MD 10/15/13 [id number]

## 2013-10-15 NOTE — Progress Notes (Signed)
INITIAL NUTRITION ASSESSMENT  DOCUMENTATION CODES Per approved criteria  -Underweight   INTERVENTION: Recommend changing home TF regimen to promote weight gain: Recommend Osmolite 1.5 @ 100 ml/hr via J-tube for 12 hours overnight to provide 1800 kcal, 75 grams of protein, and 914 ml H2O.  Recommend Providing Ensure Complete BID Recommend SLP evaluation prior to diet advancement; pt reports eating soft foods PO PTA  If pt is to remain NPO with tubed feeds, recommend Osmolite 1.5 @ 55 ml/hr continuously via J-tube to provide 1980 kcal, 83 grams of protein and 1005 ml of H2O. This will meet 108% of estimated kcal needs and 100% of estimated protein needs.   NUTRITION DIAGNOSIS: Less than optimal enteral infusion related to inadequate oral intake as evidenced by underweight BMI of 17.8.   Goal: Pt to meet >/= 90% of their estimated nutrition needs   Monitor:  Diet advancement/PO intake TF initiation/rate Weight Labs  Reason for Assessment: Malnutrition Screening Tool, score of 3  79 y.o. male  Admitting Dx: HCAP (healthcare-associated pneumonia)  ASSESSMENT: 79 y.o. male with hx of dysphagia, hx of PNA, s/p J tube, with replacement about 3 weeks ago, hx of PVD, HCC (hepatocellular carcinoma), DM, HTN, hyperlipidemia, presents to the ER with abdominal pain around the J tube site, with increased purulent discharge, along with productive coughs of yellow green sputum.   Pt reports that he usually receives Osmolite 1.5 via J-tube at 90 ml/hr for 12 hours overnight (provides 1620 kcal, 68 grams protein, and 823 ml of H2O). He usually eats a small breakfast and something like a baked potato for dinner. He drinks 1-2 Ensure supplements daily. He eats soft foods due to edentulism. Pt reports maintaining his weight between 118 and 120 lbs but, he would like to get his weight up to 130 lbs.  Current TF order is for Osmolite 1.5 @ 60 ml/hr continuously which will provide 2160 kcal, 90 grams  protein, and 1097 ml of H2O.  Last SLP note in pt's chart was from 11/09/12 and SLP recommended pt remain NPO with alternative means of nutrition.   Height: Ht Readings from Last 1 Encounters:  10/15/13 5\' 9"  (1.753 m)    Weight: Wt Readings from Last 1 Encounters:  10/15/13 120 lb (54.432 kg)    Ideal Body Weight: 160 lbs  % Ideal Body Weight: 75%  Wt Readings from Last 10 Encounters:  10/15/13 120 lb (54.432 kg)  03/21/13 114 lb (51.71 kg)  01/14/13 129 lb (58.514 kg)  12/05/12 116 lb 6.4 oz (52.799 kg)  11/09/12 119 lb 0.8 oz (54 kg)  11/03/12 128 lb 4.9 oz (58.2 kg)  11/03/12 128 lb 4.9 oz (58.2 kg)  10/02/12 124 lb 5.4 oz (56.4 kg)  10/02/12 124 lb 5.4 oz (56.4 kg)  09/25/12 140 lb (63.504 kg)    Usual Body Weight: 120 lbs  % Usual Body Weight: 100%  BMI:  Body mass index is 17.71 kg/(m^2).  Estimated Nutritional Needs: Kcal: 1610-1840 Protein: 76-88 grams Fluid: 1.6 L/day  Skin: intact  Diet Order: NPO  EDUCATION NEEDS: -No education needs identified at this time  No intake or output data in the 24 hours ending 10/15/13 1021  Last BM: PTA   Labs:   Recent Labs Lab 10/15/13 0137 10/15/13 0725  NA 135  --   K 3.4*  --   CL 98  --   CO2 26  --   BUN 24*  --   CREATININE 0.65 0.72  CALCIUM 9.0  --  GLUCOSE 111*  --     CBG (last 3)   Recent Labs  10/15/13 0729  GLUCAP 150*    Scheduled Meds: . albuterol  2.5 mg Nebulization Q6H  . aspirin EC  81 mg Oral Daily  . heparin  5,000 Units Subcutaneous Q8H  . insulin aspart  0-15 Units Subcutaneous Q4H  . ipratropium  0.5 mg Nebulization Q6H  . levothyroxine  88 mcg Oral QAC breakfast  . linagliptin  5 mg Oral Daily  . mirtazapine  30 mg Oral QHS  . mometasone-formoterol  2 puff Inhalation BID  . piperacillin-tazobactam  3.375 g Intravenous Q8H  . vancomycin  500 mg Intravenous Q12H    Continuous Infusions: . sodium chloride 125 mL/hr at 10/15/13 0428  . feeding supplement  (OSMOLITE 1.5 CAL)      Past Medical History  Diagnosis Date  . DM2 (diabetes mellitus, type 2)   . Hyperlipidemia   . Hypertension   . Cerebral vascular disease   . HCC (hepatocellular carcinoma)   . Blood dyscrasia     THROMBCYTOPATHIA  . Peripheral vascular disease   . Pneumonia 11/03/2012    Past Surgical History  Procedure Laterality Date  . Carotid stent  2002  . Liver canc    . Posterior cervical fusion/foraminotomy  09/25/2012    Procedure: POSTERIOR CERVICAL FUSION/FORAMINOTOMY LEVEL 2;  Surgeon: Tia Alert, MD;  Location: [facility];  Service: Neurosurgery;  Laterality: N/A;  Posterior Cervical three-four laminectomy, posterior cervical three-four, four-five fusion  . Esophagogastroduodenoscopy  10/01/2012    Procedure: ESOPHAGOGASTRODUODENOSCOPY (EGD);  Surgeon: Cherylynn Ridges, MD;  Location: [facility] ENDOSCOPY;  Service: General;  Laterality: N/A;  wyatt/leone  . Peg placement  10/01/2012    Procedure: PERCUTANEOUS ENDOSCOPIC GASTROSTOMY (PEG) PLACEMENT;  Surgeon: Cherylynn Ridges, MD;  Location: [facility] ENDOSCOPY;  Service: General;  Laterality: N/A;  . Esophagogastroduodenoscopy  10/31/2012    Procedure: ESOPHAGOGASTRODUODENOSCOPY (EGD);  Surgeon: Theda Belfast, MD;  Location: [facility] ENDOSCOPY;  Service: Endoscopy;  Laterality: N/A;    Ian Malkin RD, LDN Inpatient Clinical Dietitian Pager: [phone] After Hours Pager: [phone]

## 2013-10-15 NOTE — ED Notes (Signed)
Bed: ZO10 Expected date: 10/15/13 Expected time: 12:41 AM Means of arrival: Ambulance Comments: Fever, n,v

## 2013-10-15 NOTE — ED Notes (Signed)
EMS gave pt 4mg  of zofran enroute.

## 2013-10-15 NOTE — Progress Notes (Signed)
ANTIBIOTIC CONSULT NOTE - INITIAL  Pharmacy Consult for vancomycin Indication: rule out pneumonia  No Known Allergies  Patient Measurements: Height: 5\' 9"  (175.3 cm) Weight: 120 lb (54.432 kg) IBW/kg (Calculated) : 70.7 Adjusted Body Weight:   Vital Signs: Temp: 97.9 F (36.6 C) (10/16 0620) Temp src: Oral (10/16 0620) BP: 102/47 mmHg (10/16 0620) Pulse Rate: 102 (10/16 0620) Intake/Output from previous day:   Intake/Output from this shift:    Labs:  Recent Labs  10/15/13 0137  WBC 9.8  HGB 14.0  PLT 122*  CREATININE 0.65   Estimated Creatinine Clearance: 57.6 ml/min (by C-G formula based on Cr of 0.65). No results found for this basename: VANCOTROUGH, VANCOPEAK, VANCORANDOM, GENTTROUGH, GENTPEAK, GENTRANDOM, TOBRATROUGH, TOBRAPEAK, TOBRARND, AMIKACINPEAK, AMIKACINTROU, AMIKACIN,  in the last 72 hours   Microbiology: No results found for this or any previous visit (from the past 720 hour(s)).  Medical History: Past Medical History  Diagnosis Date  . DM2 (diabetes mellitus, type 2)   . Hyperlipidemia   . Hypertension   . Cerebral vascular disease   . HCC (hepatocellular carcinoma)   . Blood dyscrasia     THROMBCYTOPATHIA  . Peripheral vascular disease   . Pneumonia 11/03/2012    Medications:  Anti-infectives   Start     Dose/Rate Route Frequency Ordered Stop   10/15/13 2200  vancomycin (VANCOCIN) 500 mg in sodium chloride 0.9 % 100 mL IVPB     500 mg 100 mL/hr over 60 Minutes Intravenous Every 12 hours 10/15/13 0638     10/15/13 1400  piperacillin-tazobactam (ZOSYN) IVPB 3.375 g     3.375 g 100 mL/hr over 30 Minutes Intravenous 3 times per day 10/15/13 0619     10/15/13 0430  piperacillin-tazobactam (ZOSYN) IVPB 3.375 g     3.375 g 100 mL/hr over 30 Minutes Intravenous  Once 10/15/13 0417 10/15/13 0636   10/15/13 0430  vancomycin (VANCOCIN) IVPB 1000 mg/200 mL premix     1,000 mg 200 mL/hr over 60 Minutes Intravenous  Once 10/15/13 0417 10/15/13 1610      Assessment: Patient with r/o PNA.  First dose of antibiotics already given.  Goal of Therapy:  Vancomycin trough level 15-20 mcg/ml  Plan:  Measure antibiotic drug levels at steady state Follow up culture results Vancomycin 500mg  iv q12hr  Darlina Guys, Jacquenette Shone Crowford 10/15/2013,6:39 AM

## 2013-10-15 NOTE — Progress Notes (Signed)
Report called to Pamela,RN

## 2013-10-15 NOTE — H&P (Signed)
[facility] History and Physical  [name] YNW:[id number] DOB: 1931-01-12    PCP:   Bebe Liter, NP   Chief Complaint: abdominal pain, nausea, vomiting, and productive cough.  HPI: [name] is an 79 y.o. male with hx of dysphagia, hx of PNA, s/p J tube, with replacement about 3 weeks ago, hx of PVD, HCC (hepatocellular carcinoma), DM, HTN, hyperlipidemia, presents to the ER with abdominal pain around the J tube site, with increased purulent discharge, along with productive coughs of yellow green sputum.  He has no fever or chills, chest pain or increase DOE.  He lives at home.  He no longer smokes.  Evalaution in the ER showed Oxygen Sat of 94 percent, no leukocytosis, normal Hb, Platelet count of 122K, with normal renal fx tests.  His CXR showed ? Atelectasis vs PNA.  Hospitalist was asked to admit him for HCAP, possible aspiration, and J tube ostomy site cellulitis.  Rewiew of Systems:  Constitutional: Negative for malaise. No significant weight loss or weight gain Eyes: Negative for eye pain, redness and discharge, diplopia, visual changes, or flashes of light. ENMT: Negative for ear pain, hoarseness, nasal congestion, sinus pressure and sore throat. No headaches; tinnitus, drooling, or problem swallowing. Cardiovascular: Negative for chest pain, palpitations, diaphoresis, dyspnea and peripheral edema. ; No orthopnea, PND Respiratory: Negative for hemoptysis, wheezing and stridor. No pleuritic chestpain. Gastrointestinal: Negative for nausea, vomiting, diarrhea, constipation, melena, blood in stool, hematemesis, jaundice and rectal bleeding.    Genitourinary: Negative for frequency, dysuria, incontinence,flank pain and hematuria; Musculoskeletal: Negative for back pain and neck pain. Negative for swelling and trauma.;  Skin: . Negative for pruritus, rash, abrasions, bruising and skin lesion.; ulcerations Neuro: Negative for headache, lightheadedness and neck  stiffness. Negative for weakness, altered level of consciousness , altered mental status, extremity weakness, burning feet, involuntary movement, seizure and syncope.  Psych: negative for anxiety, depression, insomnia, tearfulness, panic attacks, hallucinations, paranoia, suicidal or homicidal ideation    Past Medical History  Diagnosis Date  . DM2 (diabetes mellitus, type 2)   . Hyperlipidemia   . Hypertension   . Cerebral vascular disease   . HCC (hepatocellular carcinoma)   . Blood dyscrasia     THROMBCYTOPATHIA  . Peripheral vascular disease   . Pneumonia 11/03/2012    Past Surgical History  Procedure Laterality Date  . Carotid stent  2002  . Liver canc    . Posterior cervical fusion/foraminotomy  09/25/2012    Procedure: POSTERIOR CERVICAL FUSION/FORAMINOTOMY LEVEL 2;  Surgeon: Tia Alert, MD;  Location: [facility];  Service: Neurosurgery;  Laterality: N/A;  Posterior Cervical three-four laminectomy, posterior cervical three-four, four-five fusion  . Esophagogastroduodenoscopy  10/01/2012    Procedure: ESOPHAGOGASTRODUODENOSCOPY (EGD);  Surgeon: Cherylynn Ridges, MD;  Location[facility] ENDOSCOPY;  Service: General;  Laterality: N/A;  wyatt/leone  . Peg placement  10/01/2012    Procedure: PERCUTANEOUS ENDOSCOPIC GASTROSTOMY (PEG) PLACEMENT;  Surgeon: Cherylynn Ridges, MD;  Location: [facility] ENDOSCOPY;  Service: General;  Laterality: N/A;  . Esophagogastroduodenoscopy  10/31/2012    Procedure: ESOPHAGOGASTRODUODENOSCOPY (EGD);  Surgeon: Theda Belfast, MD;  Location: [facility] ENDOSCOPY;  Service: Endoscopy;  Laterality: N/A;    Medications:  HOME MEDS: Prior to Admission medications   Medication Sig Start Date End Date Taking? Authorizing Provider  albuterol (PROVENTIL) (5 MG/ML) 0.5% nebulizer solution Take 0.5 mLs (2.5 mg total) by nebulization every 6 (six) hours. 12/05/12  Yes Storm Frisk, MD  aspirin EC 81 MG tablet Take  81 mg by mouth daily.   Yes Historical Provider, MD  cyproheptadine  (PERIACTIN) 4 MG tablet Take 4 mg by mouth every evening.   Yes Historical Provider, MD  Fluticasone-Salmeterol (ADVAIR) 250-50 MCG/DOSE AEPB Inhale 1 puff into the lungs every 12 (twelve) hours.   Yes Historical Provider, MD  ipratropium (ATROVENT) 0.02 % nebulizer solution Take 2.5 mLs (0.5 mg total) by nebulization every 6 (six) hours. 12/05/12  Yes Storm Frisk, MD  levothyroxine (SYNTHROID, LEVOTHROID) 88 MCG tablet Take 88 mcg by mouth daily before breakfast.   Yes Historical Provider, MD  linagliptin (TRADJENTA) 5 MG TABS tablet Take 5 mg by mouth daily.    Yes Historical Provider, MD  mirtazapine (REMERON) 30 MG tablet Take 30 mg by mouth at bedtime.   Yes Historical Provider, MD  Nutritional Supplements (FEEDING SUPPLEMENT, OSMOLITE 1.5 CAL,) LIQD Place 1,000 mLs into feeding tube continuous. 11/09/12  Yes Sorin Luanne Bras, MD  oxyCODONE (OXY IR/ROXICODONE) 5 MG immediate release tablet Take 10 mg by mouth every 4 (four) hours as needed for pain.    Historical Provider, MD  oxyCODONE-acetaminophen (PERCOCET/ROXICET) 5-325 MG per tablet Take 1 tablet by mouth every 4 (four) hours as needed for pain.    Historical Provider, MD     Allergies:  No Known Allergies  Social History:   reports that he quit smoking about 21 months ago. His smoking use included Cigarettes. He has a 50 pack-year smoking history. He has never used smokeless tobacco. He reports that he drinks about 1.2 ounces of alcohol per week. He reports that he does not use illicit drugs.  Family History: No family history on file.   Physical Exam: Filed Vitals:   10/15/13 0104 10/15/13 0115 10/15/13 0145 10/15/13 0428  BP: 146/63   84/51  Pulse: 101 101 101 86  Temp: 100.7 F (38.2 C)   98.6 F (37 C)  TempSrc: Oral   Oral  Resp:  21 22 21   SpO2: 100% 92% 100% 100%   Blood pressure 84/51, pulse 86, temperature 98.6 F (37 C), temperature source Oral, resp. rate 21, SpO2 100.00%.  GEN:  Pleasant  patient lying in  the stretcher in no acute distress; cooperative with exam. PSYCH:  alert and oriented x4; does not appear anxious or depressed; affect is appropriate. HEENT: Mucous membranes pink and anicteric; PERRLA; EOM intact; no cervical lymphadenopathy nor thyromegaly or carotid bruit; no JVD; There were no stridor. Neck is very supple. Breasts:: Not examined CHEST WALL: No tenderness CHEST: Normal respiration. Scattered rhonchi HEART: Regular rate and rhythm.  There are no murmur, rub, or gallops.   BACK: No kyphosis or scoliosis; no CVA tenderness ABDOMEN: soft and slightly tender. no masses, no organomegaly, normal abdominal bowel sounds; no pannus; no intertriginous candida. There is no rebound and no distention. Rectal Exam: Not done EXTREMITIES: No bone or joint deformity; age-appropriate arthropathy of the hands and knees; no edema; no ulcerations.  There is no calf tenderness. Genitalia: not examined PULSES: 2+ and symmetric SKIN: Normal hydration no rash or ulceration CNS: Cranial nerves 2-12 grossly intact no focal lateralizing neurologic deficit.  Speech is fluent; uvula elevated with phonation, facial symmetry and tongue midline. DTR are normal bilaterally, cerebella exam is intact, barbinski is negative and strengths are equaled bilaterally.  No sensory loss.   Labs on Admission:  Basic Metabolic Panel:  Recent Labs Lab 10/15/13 0137  NA 135  K 3.4*  CL 98  CO2 26  GLUCOSE 111*  BUN 24*  CREATININE 0.65  CALCIUM 9.0   Liver Function Tests:  Recent Labs Lab 10/15/13 0137  AST 31  ALT 15  ALKPHOS 109  BILITOT 0.9  PROT 8.0  ALBUMIN 3.2*   No results found for this basename: LIPASE, AMYLASE,  in the last 168 hours No results found for this basename: AMMONIA,  in the last 168 hours CBC:  Recent Labs Lab 10/15/13 0137  WBC 9.8  NEUTROABS 7.9*  HGB 14.0  HCT 39.5  MCV 90.6  PLT 122*   Cardiac Enzymes: No results found for this basename: CKTOTAL, CKMB, CKMBINDEX,  TROPONINI,  in the last 168 hours  CBG: No results found for this basename: GLUCAP,  in the last 168 hours   Radiological Exams on Admission: Dg Chest 2 View  10/15/2013   CLINICAL DATA:  Chest pain and shortness of Breath  EXAM: CHEST  2 VIEW  COMPARISON:  Prior radiograph from 12/05/2012  FINDINGS: Cardiac and mediastinal silhouettes are stable in size and contour, and remain within normal limits.  The lungs are normally inflated. There is asymmetric patchy and linear opacities within the left lung base, which may reflect atelectasis or infiltrate. There is no pulmonary edema or pleural effusion. The right lung is clear.  Multilevel degenerative changes are present within the visualized spine. No acute osseous abnormality identified.  IMPRESSION: Patchy and linear opacities within the left lung base, new relative to the most recent examination from 12/05/2012. While these findings may represent atelectatic changes, possible infectious or aspiration pneumonitis could also have this appearance.   Electronically Signed   By: Rise Mu M.D.   On: 10/15/2013 02:12     Assessment/Plan Present on Admission:  . HCAP (healthcare-associated pneumonia) . Copd Golds C . Hyperlipidemia . Hypertension . Hypothyroid . Protein-calorie malnutrition, severe . DM (diabetes mellitus) . Dysphagia, J tube placed . Cellulitis at gastrostomy tube site  PLAN:  Will admit him for HCAP with possible aspiration.  He also has some cellulitis around the J tube, as it has increase purulent discharge.  Will continue with IV van/ Zosyn.  He doesn't have any desat, and appears to be very stable.  For his hypothyroidism, will continue supplement and check TSH.  For his DM, will use SSI, and continue his TF via the J tube.  Pharmacy will help with the order in the am.  His other medical problems are stable, and will continue his meds.  He is a full code, and will be admitted to [facility] service.  Thank you for allowing  me to participate in his care.  Other plans as per orders.  Code Status: FULL Unk Lightning, MD. [facility] Pager [phone] 7pm to 7am.  10/15/2013, 5:33 AM

## 2013-10-15 NOTE — Progress Notes (Signed)
[facility] PROGRESS NOTE  [name] WUJ:[id number] DOB: 02/17/1931 DOA: 10/15/2013 PCP: Bebe Liter, NP I have seen and examined pt who is a 79yo admitted this am by Dr Conley Rolls with h/o HCC (hepatocellular carcinoma), DM, HTN, PVD, hyperlipidemia, brought to the ER with abdominal pain around the J tube site, and a productive cough. Chest x-ray showed pneumonia/ aspiration pneumonitis versus atelectasis patient was febrile, white cell count of 11.8. He was also found to have findings consistent with PEG site cellulitis. We'll continue current antibiotics/management plan as per Dr. Conley Rolls. Will also consult speech therapy for swallow eval and follow. He states his cough is improved today, Denies SOB.    [name]  [facility] Pager [phone]. If 7PM-7AM, please contact night-coverage at [email or web address], password [facility] 10/15/2013, 5:48 PM  LOS: 0 days

## 2013-10-15 NOTE — ED Notes (Signed)
Per EMS report: pt from home: pt c/o of N/V x 2 days.  Pt also reports a fever that began this evening.  Pt has a feeding tube in his lower left quadrant.  Pt has dried yellow/white pus.  Pt a/o x 4.  EMS VS: BP: 146/76, HR: 105 Irregular, RR: 24, 99% on 3L Wallace, CBG: 99

## 2013-10-16 ENCOUNTER — Inpatient Hospital Stay (HOSPITAL_COMMUNITY): Payer: PRIVATE HEALTH INSURANCE

## 2013-10-16 LAB — BASIC METABOLIC PANEL
CO2: 27 mEq/L (ref 19–32)
Calcium: 8.3 mg/dL — ABNORMAL LOW (ref 8.4–10.5)
Chloride: 101 mEq/L (ref 96–112)
Creatinine, Ser: 0.78 mg/dL (ref 0.50–1.35)
GFR calc Af Amer: >90 mL/min (ref >90)
Sodium: 134 mEq/L — ABNORMAL LOW (ref 135–145)

## 2013-10-16 LAB — GLUCOSE, CAPILLARY
Glucose-Capillary: 164 mg/dL — ABNORMAL HIGH (ref 70–99)
Glucose-Capillary: 165 mg/dL — ABNORMAL HIGH (ref 70–99)
Glucose-Capillary: 199 mg/dL — ABNORMAL HIGH (ref 70–99)
Glucose-Capillary: 88 mg/dL (ref 70–99)
Glucose-Capillary: 94 mg/dL (ref 70–99)
Glucose-Capillary: 94 mg/dL (ref 70–99)

## 2013-10-16 LAB — URINE CULTURE: Culture: NO GROWTH

## 2013-10-16 LAB — CBC
MCH: 31.2 pg (ref 26.0–34.0)
Platelets: 111 10*3/uL — ABNORMAL LOW (ref 150–400)
RBC: 3.82 MIL/uL — ABNORMAL LOW (ref 4.22–5.81)
WBC: 8.2 10*3/uL (ref 4.0–10.5)

## 2013-10-16 MED ORDER — FREE WATER
200.0000 mL | Freq: Three times a day (TID) | Status: DC
  Administered 2013-10-16 – 2013-10-18 (×8): 200 mL

## 2013-10-16 NOTE — Evaluation (Signed)
Clinical/Bedside Swallow Evaluation Patient Details  Name: [name] MRN: [id number] Date of Birth: 16-Jun-1931  Today's Date: 10/16/2013 Time: 0950-1015 SLP Time Calculation (min): 25 min  Past Medical History:  Past Medical History  Diagnosis Date  . DM2 (diabetes mellitus, type 2)   . Hyperlipidemia   . Hypertension   . Cerebral vascular disease   . HCC (hepatocellular carcinoma)   . Blood dyscrasia     THROMBCYTOPATHIA  . Peripheral vascular disease   . Pneumonia 11/03/2012   Past Surgical History:  Past Surgical History  Procedure Laterality Date  . Carotid stent  2002  . Liver canc    . Posterior cervical fusion/foraminotomy  09/25/2012    Procedure: POSTERIOR CERVICAL FUSION/FORAMINOTOMY LEVEL 2;  Surgeon: Tia Alert, MD;  Location: [facility];  Service: Neurosurgery;  Laterality: N/A;  Posterior Cervical three-four laminectomy, posterior cervical three-four, four-five fusion  . Esophagogastroduodenoscopy  10/01/2012    Procedure: ESOPHAGOGASTRODUODENOSCOPY (EGD);  Surgeon: Cherylynn Ridges, MD;  Location: [facility] ENDOSCOPY;  Service: General;  Laterality: N/A;  wyatt/leone  . Peg placement  10/01/2012    Procedure: PERCUTANEOUS ENDOSCOPIC GASTROSTOMY (PEG) PLACEMENT;  Surgeon: Cherylynn Ridges, MD;  Location: [facility] ENDOSCOPY;  Service: General;  Laterality: N/A;  . Esophagogastroduodenoscopy  10/31/2012    Procedure: ESOPHAGOGASTRODUODENOSCOPY (EGD);  Surgeon: Theda Belfast, MD;  Location: [facility] ENDOSCOPY;  Service: Endoscopy;  Laterality: N/A;   HPI:  [name] is an 79 y.o. male with hx of dysphagia, hx of PNA, s/p J tube, with replacement about 3 weeks ago, hx of PVD, HCC (hepatocellular carcinoma), DM, HTN, hyperlipidemia, presents to the ER with abdominal pain around the J tube site, with increased purulent discharge, along with productive coughs of yellow green sputum.  He has no fever or chills, chest pain or increase DOE.  He lives at home.  He no longer smokes.   Evalaution in the ER showed Oxygen Sat of 94 percent, no leukocytosis, normal Hb, Platelet count of 122K, with normal renal fx tests.  His CXR showed ? Atelectasis vs PNA.  Hospitalist was asked to admit him for HCAP, possible aspiration, and J tube ostomy site cellulitis   Assessment / Plan / Recommendation Clinical Impression  Previous records from [facility] reviewed.  Pt. with chronic dysphagia, PEG initially placed 09/13 (per pt.) at [facility].  Previous MBSS and FEES revealed aspiration with all consistencies, however, pt. chose to continue po intake (currently asking for a steak) despite aspiration risk, and yet has remained a full code.  Pt. now with pneumonia and also PEG site infection.  Pt. wantsto have PEG tube removed and be allowed to eat, but states "Only if you say it's ok."  Last swallow study was 05/09/13 at [facility].  Will repeat MBS today for objective evaluation of current swallow status.  If no improvement, pt./MD  may need to reconsider code status and have Palliative Care Consult.    Aspiration Risk  Severe    Diet Recommendation NPO   Medication Administration: Via alternative means    Other  Recommendations Recommended Consults: MBS   Follow Up Recommendations       Frequency and Duration        Pertinent Vitals/Pain n/a    SLP Swallow Goals   Pending MBS   Swallow Study Prior Functional Status       General Date of Onset: 09/16/12 (Pt. reports Peg was placed in 09/13.) HPI: [name] is an 79 y.o. male with  hx of dysphagia, hx of PNA, s/p J tube, with replacement about 3 weeks ago, hx of PVD, HCC (hepatocellular carcinoma), DM, HTN, hyperlipidemia, presents to the ER with abdominal pain around the J tube site, with increased purulent discharge, along with productive coughs of yellow green sputum.  He has no fever or chills, chest pain or increase DOE.  He lives at home.  He no longer smokes.  Evalaution in the ER showed Oxygen Sat of 94 percent, no  leukocytosis, normal Hb, Platelet count of 122K, with normal renal fx tests.  His CXR showed ? Atelectasis vs PNA.  Hospitalist was asked to admit him for HCAP, possible aspiration, and J tube ostomy site cellulitis Type of Study: Bedside swallow evaluation Previous Swallow Assessment: Multiple MBSS and FEES completed.  Last was 05/09/13, with severe pharyngeal dysphagia with aspiration.  Pt. chose to continue to eat despite aspiration risks, but remains a FULL CODE! Diet Prior to this Study: NPO;PEG tube Temperature Spikes Noted: No Respiratory Status: Room air History of Recent Intubation: No Behavior/Cognition: Alert;Impulsive;Distractible;Requires cueing;Decreased sustained attention Oral Cavity - Dentition: Edentulous Self-Feeding Abilities: Able to feed self Patient Positioning: Upright in bed Baseline Vocal Quality: Clear Volitional Cough: Congested    Oral/Motor/Sensory Function Overall Oral Motor/Sensory Function: Appears within functional limits for tasks assessed   Ice Chips Ice chips: Impaired Presentation: Spoon Pharyngeal Phase Impairments: Suspected delayed Swallow;Decreased hyoid-laryngeal movement;Cough - Immediate   Thin Liquid Thin Liquid: Not tested                        Maryjo Rochester T 10/16/2013,10:30 AM

## 2013-10-16 NOTE — Progress Notes (Signed)
[name] ZOX:[id number] DOB: 06-Oct-1931 DOA: 10/15/2013 PCP: Bebe Liter, NP  Brief narrative: 79 year old male multiple chronic debilities inclusive of significant dysphagia, GOLD Stage C-known Microaspiration dysphagia, HCC s/p surgery 2012, status post G-tube placement early October, peripheral vascular disease, hepatocellular carcinoma, diabetes, hypertension, hyperlipidemia, prior pneumonia admitted with productive cough and potential aspiration pneumonia as well as peri-tubular cellulitis Patient up to one year ago was using exclusively his PEG tube but started losing weight and went down to a nadir of 95 pounds.  He states that he has started to eat a full diet again mainly because of the weight loss. He was able to come up to 120 pounds however he was never especially told it was a good idea for him to eat.  Past medical history-As per Problem list Chart reviewed as below- SVT status post radiofrequency ablation by Dr Severiano Gilbert in 2006 history of PVD and is S/P LICA stent in 2002 gastrostomy tube placed on 10/03/2012. His gastrostomy tube was dislodged and it had to be replaced by interventional radiology on 10/14/2012 G-tube was converted to a jejunostomy tube on 10/28/2012 by interventional radiology ? Tachy-brady  Consultants:  none  Procedures:  See below  Antibiotics:  Zosyn  Vancomycin   Subjective  Alert pleasant oriented, no distress.  Tolerating diet. No fever or chills currently. No n/v/cp   Objective    Interim History:   Telemetry: nad  Objective: Filed Vitals:   10/15/13 2145 10/16/13 0150 10/16/13 0600 10/16/13 0742  BP: 153/69  114/51   Pulse: 81  82   Temp: 98.4 F (36.9 C)  98.6 F (37 C)   TempSrc: Oral  Oral   Resp: 16  16   Height:      Weight:      SpO2: 100% 93% 95% 95%    Intake/Output Summary (Last 24 hours) at 10/16/13 1238 Last data filed at 10/16/13 0802  Gross per 24 hour  Intake 4888.83 ml  Output    700 ml    Net 4188.83 ml    Exam:  General: Alert, EOMI, frail, emaciated Cardiovascular:  s1 s2 no m/r/g Respiratory:  Clear, no added sound Abdomen:  Soft, J tube area seems clean,  No tednerness Skin see above Neuro power within normal limits, ROM gossly intact  Data Reviewed: Basic Metabolic Panel:  Recent Labs Lab 10/15/13 0137 10/15/13 0725 10/16/13 0540  NA 135  --  134*  K 3.4*  --  3.5  CL 98  --  101  CO2 26  --  27  GLUCOSE 111*  --  259*  BUN 24*  --  22  CREATININE 0.65 0.72 0.78  CALCIUM 9.0  --  8.3*   Liver Function Tests:  Recent Labs Lab 10/15/13 0137  AST 31  ALT 15  ALKPHOS 109  BILITOT 0.9  PROT 8.0  ALBUMIN 3.2*   No results found for this basename: LIPASE, AMYLASE,  in the last 168 hours No results found for this basename: AMMONIA,  in the last 168 hours CBC:  Recent Labs Lab 10/15/13 0137 10/15/13 0725 10/16/13 0540  WBC 9.8 11.8* 8.2  NEUTROABS 7.9*  --   --   HGB 14.0 11.9* 11.9*  HCT 39.5 34.9* 35.4*  MCV 90.6 91.4 92.7  PLT 122* 110* 111*   Cardiac Enzymes: No results found for this basename: CKTOTAL, CKMB, CKMBINDEX, TROPONINI,  in the last 168 hours BNP: No components found with this basename: POCBNP,  CBG:  Recent Labs Lab  10/15/13 2007 10/16/13 0007 10/16/13 0359 10/16/13 0743 10/16/13 1144  GLUCAP 177* 199* 165* 164* 94    Recent Results (from the past 240 hour(s))  CULTURE, BLOOD (ROUTINE X 2)     Status: None   Collection Time    10/15/13  1:37 AM      Result Value Range Status   Specimen Description BLOOD RIGHT ANTECUBITAL   Final   Special Requests BOTTLES DRAWN AEROBIC AND ANAEROBIC [facility] EACH   Final   Culture  Setup Time     Final   Value: 10/15/2013 09:42     Performed at [facility]   Culture     Final   Value:        BLOOD CULTURE RECEIVED NO GROWTH TO DATE CULTURE WILL BE HELD FOR 5 DAYS BEFORE ISSUING A FINAL NEGATIVE REPORT     Performed at [facility]   Report Status PENDING    Incomplete  CULTURE, BLOOD (ROUTINE X 2)     Status: None   Collection Time    10/15/13  1:45 AM      Result Value Range Status   Specimen Description BLOOD RIGHT ANTECUBITAL   Final   Special Requests BOTTLES DRAWN AEROBIC AND ANAEROBIC 6 CC EACH   Final   Culture  Setup Time     Final   Value: 10/15/2013 09:37     Performed at [facility]   Culture     Final   Value:        BLOOD CULTURE RECEIVED NO GROWTH TO DATE CULTURE WILL BE HELD FOR 5 DAYS BEFORE ISSUING A FINAL NEGATIVE REPORT     Performed at [facility]   Report Status PENDING   Incomplete  URINE CULTURE     Status: None   Collection Time    10/15/13  3:24 AM      Result Value Range Status   Specimen Description URINE, RANDOM   Final   Special Requests NONE   Final   Culture  Setup Time     Final   Value: 10/15/2013 09:50     Performed at [facility] Count     Final   Value: NO GROWTH     Performed at [facility]   Culture     Final   Value: NO GROWTH     Performed at [facility]   Report Status 10/16/2013 FINAL   Final     Studies:              All Imaging reviewed and is as per above notation   Scheduled Meds: . albuterol  2.5 mg Nebulization Q6H  . aspirin EC  81 mg Oral Daily  . feeding supplement (OSMOLITE 1.5 CAL)  1,000 mL Per Tube Q24H  . heparin  5,000 Units Subcutaneous Q8H  . insulin aspart  0-15 Units Subcutaneous Q4H  . ipratropium  0.5 mg Nebulization Q6H  . levothyroxine  88 mcg Oral QAC breakfast  . linagliptin  5 mg Oral Daily  . mirtazapine  30 mg Oral QHS  . mometasone-formoterol  2 puff Inhalation BID  . piperacillin-tazobactam (ZOSYN)  IV  3.375 g Intravenous Q8H  . vancomycin  500 mg Intravenous Q12H   Continuous Infusions: . sodium chloride 125 mL/hr at 10/16/13 0502     Assessment/Plan:  1. Aspiration pneumonia-continue current vancomycin/Zosyn. White count currently 8 point 2 down from 11.8, therefore will consider  transition  to either clindamycin/Augmentin 11/18. Patient also has history of micro-aspiration as well 2. Acute kidney injury-baseline he had creatinine usually 20-1 ratio- At this rate, we'll increase free water flushes through PEG tube and hold IV fluids 3. Gold COPD stage C. with microaspiration-see above. Will need outpatient interval pulmonary follow-up-continue ipratropium every 6 hours 2.5 mg, Dulera 2 puffs twice a day 4. Severe malnutrition, protein energy-continue Osmolite 1000 miles every 24 hourly. Patient states that his rationale for eating despite having a PEG tube is that he loses weight significantly to the tune of~30 pounds when he only uses suspect feeds. I've consulted nutritionist to assist  5. Diabetes mellitus-continue Linagliptin 5 mg daily, subcutaneous insulin every 4 hourly coverage given on PEG tube feeds and is now currently n.p.o. blood sugars range between 90s and 250 today, monitor 6. History of hepatocellular carcinoma status post resection 2012-currently stable.  Continue chronic pain meds.  Out-patient surveillance prn 7. History of paroxysmal atrial flutter/SVT, [place] asked score of 4, poor candidate for systemic anticoagulation, stable currently 8. History PVD status post LICA stent 2002-stable. Continue aspirin 81 mg daily 9. Depression, continue Remeron 30 mg daily  Code Status: Full code Family Communication: None present-Allison,Kay Significant other [phone] updated on telephone Disposition Plan:    Pleas Koch, MD  [facility] Pager [phone] 10/16/2013, 12:38 PM    LOS: 1 day

## 2013-10-16 NOTE — Progress Notes (Signed)
NUTRITION FOLLOW UP/CONSULT  Intervention:   - Recommend changing home TF regimen to promote weight gain: Recommend Osmolite 1.5 @ 100 ml/hr via J-tube for 12 hours overnight to provide 1800 kcal, 75 grams of protein, and 914 ml H2O which will meet 112% estimated calorie needs and 99% estimated protein needs. If IVF d/c, recommend water flushes 6 times/day via J tube. - Recommend initiation of adult enteral protocol  Nutrition Dx:   Less than optimal enteral infusion related to inadequate oral intake as evidenced by underweight BMI of 17.8 - ongoing   Goal:   Pt to meet >/= 90% of their estimated nutrition needs - not met   Monitor:   Weights, labs, TF rate/advancement/tolerance  Assessment:   79 y.o. male with hx of dysphagia, hx of PNA, s/p J tube, with replacement about 3 weeks ago, hx of PVD, HCC (hepatocellular carcinoma), DM, HTN, hyperlipidemia, presents to the ER with abdominal pain around the J tube site, with increased purulent discharge, along with productive coughs of yellow green sputum.  Pt had SLP bedside evaluation and MBS today with recommendations for NPO versus dysphagia 1/honey thick with known aspiration risks. Pt wants to gain weight and RD on 10/16 recommended changing TF regimen to promote weight gain.   Current TF order: Osmolite 1.5 at 40ml/hr x 12 hours at night - provides 1620 kcal, 68 grams protein, and 823 ml of H2O meeting 101% estimated calorie needs, 89% estimated protein needs  Height: Ht Readings from Last 1 Encounters:  10/15/13 5\' 9"  (1.753 m)    Weight Status:   Wt Readings from Last 1 Encounters:  10/15/13 120 lb (54.432 kg)    Re-estimated needs:  Kcal: 1610-1840  Protein: 76-88 grams  Fluid: 1.6 L/day   Skin: intact   Diet Order: NPO   Intake/Output Summary (Last 24 hours) at 10/16/13 1557 Last data filed at 10/16/13 1547  Gross per 24 hour  Intake 4888.83 ml  Output    750 ml  Net 4138.83 ml    Last BM:  PTA   Labs:   Recent Labs Lab 10/15/13 0137 10/15/13 0725 10/16/13 0540  NA 135  --  134*  K 3.4*  --  3.5  CL 98  --  101  CO2 26  --  27  BUN 24*  --  22  CREATININE 0.65 0.72 0.78  CALCIUM 9.0  --  8.3*  GLUCOSE 111*  --  259*    CBG (last 3)   Recent Labs  10/16/13 0359 10/16/13 0743 10/16/13 1144  GLUCAP 165* 164* 94    Scheduled Meds: . albuterol  2.5 mg Nebulization Q6H  . aspirin EC  81 mg Oral Daily  . feeding supplement (OSMOLITE 1.5 CAL)  1,000 mL Per Tube Q24H  . free water  200 mL Per Tube Q8H  . heparin  5,000 Units Subcutaneous Q8H  . insulin aspart  0-15 Units Subcutaneous Q4H  . ipratropium  0.5 mg Nebulization Q6H  . levothyroxine  88 mcg Oral QAC breakfast  . linagliptin  5 mg Oral Daily  . mirtazapine  30 mg Oral QHS  . mometasone-formoterol  2 puff Inhalation BID  . piperacillin-tazobactam (ZOSYN)  IV  3.375 g Intravenous Q8H  . vancomycin  500 mg Intravenous Q12H     Levon Hedger MS, RD, LDN [phone] Pager [phone] After Hours Pager
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
## 2013-10-17 ENCOUNTER — Inpatient Hospital Stay (HOSPITAL_COMMUNITY): Payer: PRIVATE HEALTH INSURANCE

## 2013-10-17 DIAGNOSIS — J690 Pneumonitis due to inhalation of food and vomit: Secondary | ICD-10-CM

## 2013-10-17 LAB — GLUCOSE, CAPILLARY
Glucose-Capillary: 163 mg/dL — ABNORMAL HIGH (ref 70–99)
Glucose-Capillary: 183 mg/dL — ABNORMAL HIGH (ref 70–99)
Glucose-Capillary: 88 mg/dL (ref 70–99)
Glucose-Capillary: 75 mg/dL (ref 70–99)

## 2013-10-17 MED ORDER — SODIUM CHLORIDE 0.9 % IV SOLN
1.5000 g | Freq: Four times a day (QID) | INTRAVENOUS | Status: DC
  Administered 2013-10-17 – 2013-10-18 (×5): 1.5 g via INTRAVENOUS
  Filled 2013-10-17 (×7): qty 1.5

## 2013-10-17 MED ORDER — ALBUTEROL SULFATE (5 MG/ML) 0.5% IN NEBU
2.5000 mg | INHALATION_SOLUTION | Freq: Three times a day (TID) | RESPIRATORY_TRACT | Status: DC
  Administered 2013-10-18 – 2013-10-19 (×4): 2.5 mg via RESPIRATORY_TRACT
  Filled 2013-10-17 (×4): qty 0.5

## 2013-10-17 MED ORDER — IPRATROPIUM BROMIDE 0.02 % IN SOLN
0.5000 mg | Freq: Three times a day (TID) | RESPIRATORY_TRACT | Status: DC
  Administered 2013-10-18 – 2013-10-19 (×4): 0.5 mg via RESPIRATORY_TRACT
  Filled 2013-10-17 (×4): qty 2.5

## 2013-10-17 NOTE — Progress Notes (Signed)
IR asked to evaluate GJ tube site. Patient recently seen by IR 10/01/13 with history of persistent leaking and recent upsize to 40F on 09/25/13. On 10/01/13 exam revealed erythema /mild skin breakdown at insertion site. Patient was sent to ED for further evaluation and it was determined at that time decision to upsize current jejunostomy tube was deferred secondary to potential for worsening leaking at the insertion site. Patient has been seen by wound care and the patient has been advised to use desitin with zinc oxide, which he states has helped.   Patient presented to ED 10/15/13 with GJ tube discharge, abdominal pain, and productive yellow green sputum cough. Patient states he has been eating some at home for weight gain however was never instructed to eat by mouth. CXR and clinical picture revealed findings consistent with aspiration pneumonia. Patient is on antibiotics with wbc trending down.   Speech has seen the patient for a swallowing study 10/16/13 which showed moderate oral dysphagia and severe pharyngeal dysphagia. Diet recommendation NPO vs. Dysphagia 1 honey thick. Nutrition also has seen the patient and recommend changing TF regimen to promote weight gain.   I have spoke with the nurse today who states GJ tube leakage occurs mainly when patient coughs and upon examining the patient, leakage is controlled. Erythema is minimal around tube site without evidence of significant skin breakdown. Gauze and powder are dry with GJ tube stopper in correct position. KUB has been performed unchanged position and configuration of the gastrojejunostomy tube. The jejunal limb remains within the small bowel.  Recommendations: If leakage persist consider connecting foley bag to GJ tube for additional drainage this has been discussed with floor nurse. If erythema extends or skin breakdown worsens, consider wound consult for follow-up. Continue desitin with zinc oxide and gauze changes. Continue antibiotics and  trend CBC and vitals.   D/w with Dr. Archer Asa today.  Pattricia Boss PA-C Interventional Radiology  10/17/13  11:46 AM

## 2013-10-17 NOTE — Progress Notes (Addendum)
[name] ZOX:[id number] DOB: [birth year] DOA: 10/15/2013 PCP: Bebe Liter, NP  Brief narrative: 79 year old male multiple chronic debilities inclusive of significant dysphagia, GOLD Stage C-known Microaspiration dysphagia, HCC s/p surgery 2012, status post G-tube placement early October, peripheral vascular disease, hepatocellular carcinoma, diabetes, hypertension, hyperlipidemia, prior pneumonia admitted with productive cough and potential aspiration pneumonia as well as peri-tubular cellulitis Patient up to one year ago was using exclusively his PEG tube but started losing weight and went down to a nadir of 95 pounds.  He states that he has started to eat a full diet again mainly because of the weight loss. He was able to come up to 120 pounds however he was never specifically told that he should eat and took it upon himself to regain his weight this way  Past medical history-As per Problem list Chart reviewed as below- SVT status post radiofrequency ablation by Dr Severiano Gilbert in 2006 history of PVD and is S/P LICA stent in 2002 gastrostomy tube placed on 10/03/2012. His gastrostomy tube was dislodged and it had to be replaced by interventional radiology on 10/14/2012 G-tube was converted to a jejunostomy tube on 10/28/2012 by interventional radiology ? Tachy-brady  Consultants:  none  Procedures:  See below  Antibiotics:  Zosyn 10/15--10/18  Vancomycin 10/15--10/18  Unasyn 10/18   Subjective  Shortness of breath feels better No acute distress currently Nursing reports feeding tube seems to be losing and patient has pain at the site with reflux of gastric acid on coughing   Objective    Interim History:   Telemetry: nad  Objective: Filed Vitals:   10/16/13 2200 10/17/13 0238 10/17/13 0600 10/17/13 0928  BP: 165/67  153/71   Pulse: 79  105   Temp: 99 F (37.2 C)  99.7 F (37.6 C)   TempSrc: Oral  Oral   Resp: 18  18   Height:      Weight:      SpO2:  93% 94% 92% 93%    Intake/Output Summary (Last 24 hours) at 10/17/13 1139 Last data filed at 10/17/13 1039  Gross per 24 hour  Intake   4764 ml  Output   2075 ml  Net   2689 ml    Exam:  General: Alert, EOMI, frail, emaciated Cardiovascular:  s1 s2 no m/r/g Respiratory:  Clear, no added sound Abdomen:  Soft, J tube area seems clean,  No tednerness Skin see above Neuro power within normal limits, ROM gossly intact  Data Reviewed: Basic Metabolic Panel:  Recent Labs Lab 10/15/13 0137 10/15/13 0725 10/16/13 0540  NA 135  --  134*  K 3.4*  --  3.5  CL 98  --  101  CO2 26  --  27  GLUCOSE 111*  --  259*  BUN 24*  --  22  CREATININE 0.65 0.72 0.78  CALCIUM 9.0  --  8.3*   Liver Function Tests:  Recent Labs Lab 10/15/13 0137  AST 31  ALT 15  ALKPHOS 109  BILITOT 0.9  PROT 8.0  ALBUMIN 3.2*   No results found for this basename: LIPASE, AMYLASE,  in the last 168 hours No results found for this basename: AMMONIA,  in the last 168 hours CBC:  Recent Labs Lab 10/15/13 0137 10/15/13 0725 10/16/13 0540  WBC 9.8 11.8* 8.2  NEUTROABS 7.9*  --   --   HGB 14.0 11.9* 11.9*  HCT 39.5 34.9* 35.4*  MCV 90.6 91.4 92.7  PLT 122* 110* 111*   Cardiac Enzymes: No  results found for this basename: CKTOTAL, CKMB, CKMBINDEX, TROPONINI,  in the last 168 hours BNP: No components found with this basename: POCBNP,  CBG:  Recent Labs Lab 10/16/13 2009 10/17/13 0013 10/17/13 0402 10/17/13 0458 10/17/13 0735  GLUCAP 88 195* 169* 163* 183*    Recent Results (from the past 240 hour(s))  CULTURE, BLOOD (ROUTINE X 2)     Status: None   Collection Time    10/15/13  1:37 AM      Result Value Range Status   Specimen Description BLOOD RIGHT ANTECUBITAL   Final   Special Requests BOTTLES DRAWN AEROBIC AND ANAEROBIC [facility] EACH   Final   Culture  Setup Time     Final   Value: 10/15/2013 09:42     Performed at [facility]   Culture     Final   Value:        BLOOD  CULTURE RECEIVED NO GROWTH TO DATE CULTURE WILL BE HELD FOR 5 DAYS BEFORE ISSUING A FINAL NEGATIVE REPORT     Performed at [facility]   Report Status PENDING   Incomplete  CULTURE, BLOOD (ROUTINE X 2)     Status: None   Collection Time    10/15/13  1:45 AM      Result Value Range Status   Specimen Description BLOOD RIGHT ANTECUBITAL   Final   Special Requests BOTTLES DRAWN AEROBIC AND ANAEROBIC 6 CC EACH   Final   Culture  Setup Time     Final   Value: 10/15/2013 09:37     Performed at [facility]   Culture     Final   Value:        BLOOD CULTURE RECEIVED NO GROWTH TO DATE CULTURE WILL BE HELD FOR 5 DAYS BEFORE ISSUING A FINAL NEGATIVE REPORT     Performed at [facility]   Report Status PENDING   Incomplete  URINE CULTURE     Status: None   Collection Time    10/15/13  3:24 AM      Result Value Range Status   Specimen Description URINE, RANDOM   Final   Special Requests NONE   Final   Culture  Setup Time     Final   Value: 10/15/2013 09:50     Performed at [facility] Count     Final   Value: NO GROWTH     Performed at [facility]   Culture     Final   Value: NO GROWTH     Performed at [facility]   Report Status 10/16/2013 FINAL   Final     Studies:              All Imaging reviewed and is as per above notation   Scheduled Meds: . albuterol  2.5 mg Nebulization Q6H  . aspirin EC  81 mg Oral Daily  . feeding supplement (OSMOLITE 1.5 CAL)  1,000 mL Per Tube Q24H  . free water  200 mL Per Tube Q8H  . heparin  5,000 Units Subcutaneous Q8H  . insulin aspart  0-15 Units Subcutaneous Q4H  . ipratropium  0.5 mg Nebulization Q6H  . levothyroxine  88 mcg Oral QAC breakfast  . linagliptin  5 mg Oral Daily  . mirtazapine  30 mg Oral QHS  . mometasone-formoterol  2 puff Inhalation BID  . piperacillin-tazobactam (ZOSYN)  IV  3.375 g Intravenous Q8H  . vancomycin  500 mg  Intravenous Q12H   Continuous Infusions:      Assessment/Plan:  1. Aspiration pneumonia-was on vancomycin/Zosyn. White count currently 8 point 2 down from 11.8, therefore transitioned to Unasyn 10/18 2. Acute kidney injury-baseline he had creatinine usually 20-1 ratio- At this rate, we'll increase free water flushes through PEG tube and hold IV fluids-repeat renal panel 10/19 3. Gold COPD stage C. with microaspiration-see above. Will need outpatient interval pulmonary follow-up-continue ipratropium every 6 hours 2.5 mg, Dulera 2 puffs twice a day 4. 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 Will hold heparin and place SCDs 5. G-J tube malfunction-have consulted interventional radiology this morning to see patient and schedule be n.p.o. until assessed 6. Severe malnutrition, protein energy- consulted nutritionist to assist will follow recommendations once patient has been seen by IR 7. Diabetes mellitus with nephropathy-continue Linagliptin 5 mg daily, subcutaneous insulin every 4 hourly coverage given on PEG tube feeds and is now currently n.p.o. blood sugars range between 90s and 250 today, monitor 8. History of hepatocellular carcinoma status right lobe post resection 2012-currently stable.  Continue chronic pain meds.  Out-patient surveillance prn 9. History of paroxysmal atrial flutter/SVT, Chad2Vasc score of 4, poor candidate for systemic anticoagulation, stable currently 10. History PVD status post LICA stent 2002-stable. Continue aspirin 81 mg daily 11. Depression, continue Remeron 30 mg daily  Code Status: Full code Family Communication: None present-Allison,Kay Significant other [phone] updated on telephone on 10/70 Disposition Plan: Inpatient   Pleas Koch, MD  [facility] Pager [phone] 10/17/2013, 11:39 AM    LOS: 2 days

## 2013-10-18 LAB — GLUCOSE, CAPILLARY
Glucose-Capillary: 194 mg/dL — ABNORMAL HIGH (ref 70–99)
Glucose-Capillary: 192 mg/dL — ABNORMAL HIGH (ref 70–99)
Glucose-Capillary: 85 mg/dL (ref 70–99)
Glucose-Capillary: 94 mg/dL (ref 70–99)

## 2013-10-18 LAB — RENAL FUNCTION PANEL
Albumin: 2.4 g/dL — ABNORMAL LOW (ref 3.5–5.2)
BUN: 16 mg/dL (ref 6–23)
Creatinine, Ser: 0.6 mg/dL (ref 0.50–1.35)
GFR calc Af Amer: >90 mL/min (ref >90)
Phosphorus: 1.7 mg/dL — ABNORMAL LOW (ref 2.3–4.6)
Potassium: 3.6 mEq/L (ref 3.5–5.1)
Sodium: 134 mEq/L — ABNORMAL LOW (ref 135–145)

## 2013-10-18 LAB — CBC
MCH: 31.3 pg (ref 26.0–34.0)
MCHC: 34.3 g/dL (ref 30.0–36.0)
MCV: 91.4 fL (ref 78.0–100.0)
Platelets: 131 10*3/uL — ABNORMAL LOW (ref 150–400)
RDW: 14.1 % (ref 11.5–15.5)

## 2013-10-18 MED ORDER — CLINDAMYCIN PALMITATE HCL 75 MG/5ML PO SOLR
300.0000 mg | Freq: Four times a day (QID) | ORAL | Status: DC
  Administered 2013-10-18 – 2013-10-19 (×4): 300 mg
  Filled 2013-10-18 (×8): qty 20

## 2013-10-18 MED ORDER — OSMOLITE 1.5 CAL PO LIQD
1000.0000 mL | ORAL | Status: DC
  Administered 2013-10-18: 1000 mL
  Filled 2013-10-18 (×2): qty 1000

## 2013-10-18 NOTE — Progress Notes (Signed)
[name] AVW:[id number] DOB: 10/10/1931 DOA: 10/15/2013 PCP: Bebe Liter, NP  Brief narrative: 79 year old male multiple chronic debilities inclusive of significant dysphagia, GOLD Stage C-known Microaspiration dysphagia, HCC s/p surgery 2012, status post G-tube placement early October, peripheral vascular disease, hepatocellular carcinoma, diabetes, hypertension, hyperlipidemia, prior pneumonia admitted with productive cough and potential aspiration pneumonia as well as peri-tubular cellulitis Patient up to one year ago was using exclusively his PEG tube but started losing weight and went down to a nadir of 95 pounds.  He states that he has started to eat a full diet again mainly because of the weight loss. He was able to come up to 120 pounds however he was never specifically told that he should eat and took it upon himself to regain his weight this way  Past medical history-As per Problem list Chart reviewed as below- SVT status post radiofrequency ablation by Dr Severiano Gilbert in 2006 history of PVD and is S/P LICA stent in 2002 gastrostomy tube placed on 10/03/2012. His gastrostomy tube was dislodged and it had to be replaced by interventional radiology on 10/14/2012 G-tube was converted to a jejunostomy tube on 10/28/2012 by interventional radiology ? Tachy-brady  Consultants:  none  Procedures:  See below  Antibiotics:  Zosyn 10/15--10/18  Vancomycin 10/15--10/18  Unasyn 10/18   Subjective  Shortness of breath feels better G-J tube better.  No discharge from it   Objective   Interim History:  Telemetry: nad  Objective: Filed Vitals:   10/18/13 0545 10/18/13 0840 10/18/13 0923 10/18/13 1440  BP: 152/57     Pulse: 58     Temp: 98.7 F (37.1 C)     TempSrc: Oral     Resp: 20  20   Height:      Weight:      SpO2: 93% 92%  90%    Intake/Output Summary (Last 24 hours) at 10/18/13 1620 Last data filed at 10/18/13 1538  Gross per 24 hour  Intake    200  ml  Output   1150 ml  Net   -950 ml    Exam:  General: Alert, EOMI, frail, emaciated Cardiovascular:  s1 s2 no m/r/g Respiratory:  Clear, no added sound Abdomen:  Soft, J tube area seems clean,  No tednerness Skin see above Neuro power within normal limits, ROM gossly intact  Data Reviewed: Basic Metabolic Panel:  Recent Labs Lab 10/15/13 0137 10/15/13 0725 10/16/13 0540 10/18/13 0440  NA 135  --  134* 134*  K 3.4*  --  3.5 3.6  CL 98  --  101 98  CO2 26  --  27 28  GLUCOSE 111*  --  259* 222*  BUN 24*  --  22 16  CREATININE 0.65 0.72 0.78 0.60  CALCIUM 9.0  --  8.3* 8.9  PHOS  --   --   --  1.7*   Liver Function Tests:  Recent Labs Lab 10/15/13 0137 10/18/13 0440  AST 31  --   ALT 15  --   ALKPHOS 109  --   BILITOT 0.9  --   PROT 8.0  --   ALBUMIN 3.2* 2.4*   No results found for this basename: LIPASE, AMYLASE,  in the last 168 hours No results found for this basename: AMMONIA,  in the last 168 hours CBC:  Recent Labs Lab 10/15/13 0137 10/15/13 0725 10/16/13 0540 10/18/13 0440  WBC 9.8 11.8* 8.2 5.2  NEUTROABS 7.9*  --   --   --  HGB 14.0 11.9* 11.9* 12.4*  HCT 39.5 34.9* 35.4* 36.2*  MCV 90.6 91.4 92.7 91.4  PLT 122* 110* 111* 131*   Cardiac Enzymes: No results found for this basename: CKTOTAL, CKMB, CKMBINDEX, TROPONINI,  in the last 168 hours BNP: No components found with this basename: POCBNP,  CBG:  Recent Labs Lab 10/17/13 2015 10/17/13 2353 10/18/13 0358 10/18/13 0739 10/18/13 1118  GLUCAP 117* 237* 194* 192* 85    Recent Results (from the past 240 hour(s))  CULTURE, BLOOD (ROUTINE X 2)     Status: None   Collection Time    10/15/13  1:37 AM      Result Value Range Status   Specimen Description BLOOD RIGHT ANTECUBITAL   Final   Special Requests BOTTLES DRAWN AEROBIC AND ANAEROBIC [facility] EACH   Final   Culture  Setup Time     Final   Value: 10/15/2013 09:42     Performed at [facility]   Culture     Final   Value:         BLOOD CULTURE RECEIVED NO GROWTH TO DATE CULTURE WILL BE HELD FOR 5 DAYS BEFORE ISSUING A FINAL NEGATIVE REPORT     Performed at [facility]   Report Status PENDING   Incomplete  CULTURE, BLOOD (ROUTINE X 2)     Status: None   Collection Time    10/15/13  1:45 AM      Result Value Range Status   Specimen Description BLOOD RIGHT ANTECUBITAL   Final   Special Requests BOTTLES DRAWN AEROBIC AND ANAEROBIC 6 CC EACH   Final   Culture  Setup Time     Final   Value: 10/15/2013 09:37     Performed at [facility]   Culture     Final   Value:        BLOOD CULTURE RECEIVED NO GROWTH TO DATE CULTURE WILL BE HELD FOR 5 DAYS BEFORE ISSUING A FINAL NEGATIVE REPORT     Performed at [facility]   Report Status PENDING   Incomplete  URINE CULTURE     Status: None   Collection Time    10/15/13  3:24 AM      Result Value Range Status   Specimen Description URINE, RANDOM   Final   Special Requests NONE   Final   Culture  Setup Time     Final   Value: 10/15/2013 09:50     Performed at [facility] Count     Final   Value: NO GROWTH     Performed at [facility]   Culture     Final   Value: NO GROWTH     Performed at [facility]   Report Status 10/16/2013 FINAL   Final     Studies:              All Imaging reviewed and is as per above notation   Scheduled Meds: . albuterol  2.5 mg Nebulization TID  . aspirin EC  81 mg Oral Daily  . clindamycin  300 mg Per Tube Q6H  . feeding supplement (OSMOLITE 1.5 CAL)  1,000 mL Per Tube Q24H  . free water  200 mL Per Tube Q8H  . insulin aspart  0-15 Units Subcutaneous Q4H  . ipratropium  0.5 mg Nebulization TID  . levothyroxine  88 mcg Oral QAC breakfast  . linagliptin  5 mg Oral Daily  . mirtazapine  30 mg Oral QHS  . mometasone-formoterol  2 puff Inhalation BID   Continuous Infusions:     Assessment/Plan:  1. Aspiration pneumonia-was on vancomycin/Zosyn. White count currently 8  point 2 down from 11.8, therefore transitioned to Unasyn 10/18, then clindamycin started 10/19-duration till 10/29 2. Acute kidney injury-baseline he had creatinine usually 20-1 ratio- At this rate, we'll increase free water flushes through PEG tube and hold IV fluids-repeat renal panel 10/19 3. Gold COPD stage C. with microaspiration-see above. Will need outpatient interval pulmonary follow-up-continue ipratropium every 6 hours 2.5 mg, Dulera 2 puffs twice a day 4. 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  Will hold heparin and place SCDs  Plt's improved 5. G-J tube malfunction-appreciate interventional radiology input.  Continue desitin with zinc oxide and gauze changes 6. Severe malnutrition, protein energy- consulted nutritionist to assist-will ask for further recs re: daytime feeds  7. Diabetes mellitus with nephropathy-continue Linagliptin 5 mg daily, subcutaneous insulin every 4 hourly coverage given on PEG tube feeds and is now currently n.p.o. blood sugars range between 85-163 8. History of hepatocellular carcinoma status right lobe post resection 2012-currently stable.  Continue chronic pain meds.  Out-patient surveillance prn 9. History of paroxysmal atrial flutter/SVT, Chad2Vasc score of 4, poor candidate for systemic anticoagulation, stable currently 10. History PVD status post LICA stent 2002-stable. Continue aspirin 81 mg daily 11. Depression, continue Remeron 30 mg daily  Code Status: Full code Family Communication: None present-Allison,Kay [phone] called on telephone on 10/19.   Disposition Plan: Inpatient   Pleas Koch, MD  [facility] Pager [phone] 10/18/2013, 4:20 PM    LOS: 3 days

## 2013-10-19 LAB — GLUCOSE, CAPILLARY
Glucose-Capillary: 115 mg/dL — ABNORMAL HIGH (ref 70–99)
Glucose-Capillary: 144 mg/dL — ABNORMAL HIGH (ref 70–99)
Glucose-Capillary: 245 mg/dL — ABNORMAL HIGH (ref 70–99)
Glucose-Capillary: 77 mg/dL (ref 70–99)

## 2013-10-19 MED ORDER — OSMOLITE 1.5 CAL PO LIQD: 1000.0000 mL | ORAL | Status: DC

## 2013-10-19 MED ORDER — CLINDAMYCIN PALMITATE HCL 75 MG/5ML PO SOLR: 300.0000 mg | Freq: Four times a day (QID) | ORAL | Status: DC

## 2013-10-19 MED ORDER — FREE WATER: 200.0000 mL | Freq: Three times a day (TID) | Status: DC

## 2013-10-19 MED ORDER — ENSURE COMPLETE PO LIQD: 237.0000 mL | Freq: Two times a day (BID) | ORAL | Status: DC

## 2013-10-19 NOTE — Progress Notes (Signed)
Phoned patient's son to bring clothes and coat for patient has been discharged

## 2013-10-19 NOTE — Progress Notes (Addendum)
NUTRITION FOLLOW UP  Intervention:   - Recommend MD replace pt's low phosphorus - Discussed recommended home TF plan with pt who expressed understanding. Recommend water flushes 6 times/day via GJ tube. Home TF will be Osmolite 1.5 at 134ml/hr x 12 hours at night with Ensure Complete BID via GJ tube during the day. Provided written instructions with RD contact information for pt. Discussed recommend home TF plan with MD over the phone.  - Will continue to monitor   Nutrition Dx:   Less than optimal enteral infusion related to inadequate oral intake as evidenced by underweight BMI of 17.8 - resolved, pt now getting adequate enteral infusion at night.   Goal:   Pt to meet >/= 90% of their estimated nutrition needs - met  No new nutrition dx/goals at this time as pt reports he will be d/c today.   Monitor:   Weights, labs, TF rate/advancement/tolerance  Assessment:   79 y.o. male with hx of dysphagia, hx of PNA, s/p J tube, with replacement about 3 weeks ago, hx of PVD, HCC (hepatocellular carcinoma), DM, HTN, hyperlipidemia, presents to the ER with abdominal pain around the J tube site, with increased purulent discharge, along with productive coughs of yellow green sputum.  S/p SLP bedside evaluation and MBS with recommendations for NPO versus dysphagia 1/honey thick with known aspiration risks. Pt wants to gain weight and RD on 10/16 recommended changing TF regimen to promote weight gain and pt's TF was changed from Osmolite 1.5 at 71ml/hr x 12 hours was increased to Osmolite 1.5 at 160ml/hr x 12 hours (provides 1800 kcal, 75 grams of protein, and 914 ml H2O, meeting 112% estimated calorie needs and 99% estimated protein needs).   Met with pt who reports GJ tube fell out this morning but was put back in and has not had any problems since then. Pt reports tolerating change in night TF well, denies any nausea/stomach cramping when it is running. Pt states he knows the importance of being  NPO r/t his aspiration risk. Pt sad that he won't be able to eat birthday cake tomorrow for his birthday, emotional support provided. Reports he is being d/c today. No new weights  Phosphorus low, unsure why, recommend MD replace.   Height: Ht Readings from Last 1 Encounters:  10/15/13 5\' 9"  (1.753 m)    Weight Status:   Wt Readings from Last 1 Encounters:  10/15/13 120 lb (54.432 kg)    Re-estimated needs:  Kcal: 1610-1840  Protein: 76-88 grams  Fluid: 1.6 L/day   Skin: intact   Diet Order: NPO   Intake/Output Summary (Last 24 hours) at 10/19/13 1136 Last data filed at 10/18/13 2010  Gross per 24 hour  Intake    200 ml  Output    500 ml  Net   -300 ml    Last BM: PTA   Labs:   Recent Labs Lab 10/15/13 0137 10/15/13 0725 10/16/13 0540 10/18/13 0440  NA 135  --  134* 134*  K 3.4*  --  3.5 3.6  CL 98  --  101 98  CO2 26  --  27 28  BUN 24*  --  22 16  CREATININE 0.65 0.72 0.78 0.60  CALCIUM 9.0  --  8.3* 8.9  PHOS  --   --   --  1.7*  GLUCOSE 111*  --  259* 222*    CBG (last 3)   Recent Labs  10/19/13 0015 10/19/13 0359 10/19/13 0727  GLUCAP 245*  146* 144*    Scheduled Meds: . albuterol  2.5 mg Nebulization TID  . aspirin EC  81 mg Oral Daily  . clindamycin  300 mg Per Tube Q6H  . feeding supplement (OSMOLITE 1.5 CAL)  1,000 mL Per Tube Q24H  . free water  200 mL Per Tube Q8H  . insulin aspart  0-15 Units Subcutaneous Q4H  . ipratropium  0.5 mg Nebulization TID  . levothyroxine  88 mcg Oral QAC breakfast  . linagliptin  5 mg Oral Daily  . mirtazapine  30 mg Oral QHS  . mometasone-formoterol  2 puff Inhalation BID     Levon Hedger MS, RD, LDN [phone] Pager [phone] After Hours Pager

## 2013-10-19 NOTE — Discharge Summary (Signed)
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 X 2)     Status: None   Collection Time    10/15/13  1:37 AM      Result Value Range Status   Specimen Description BLOOD RIGHT ANTECUBITAL   Final   Special Requests BOTTLES DRAWN AEROBIC AND ANAEROBIC [facility] EACH   Final   Culture  Setup Time     Final   Value: 10/15/2013 09:42     Performed at [facility]   Culture     Final   Value:        BLOOD CULTURE RECEIVED NO GROWTH TO DATE CULTURE WILL BE HELD FOR 5 DAYS BEFORE ISSUING A FINAL NEGATIVE REPORT     Performed at [facility]   Report Status PENDING   Incomplete  CULTURE, BLOOD (ROUTINE X 2)     Status: None   Collection Time    10/15/13  1:45 AM      Result Value Range Status   Specimen Description BLOOD RIGHT ANTECUBITAL   Final   Special Requests BOTTLES DRAWN AEROBIC AND ANAEROBIC 6 CC EACH   Final   Culture  Setup Time     Final   Value: 10/15/2013 09:37     Performed at [facility]   Culture     Final   Value:        BLOOD CULTURE RECEIVED NO GROWTH TO DATE CULTURE WILL BE HELD FOR 5 DAYS BEFORE ISSUING A FINAL NEGATIVE REPORT     Performed at [facility]  Report Status PENDING   Incomplete  URINE CULTURE     Status: None   Collection  Time    10/15/13  3:24 AM      Result Value Range Status   Specimen Description URINE, RANDOM   Final   Special Requests NONE   Final   Culture  Setup Time     Final   Value: 10/15/2013 09:50     Performed at [facility] Count     Final   Value: NO GROWTH     Performed at [facility]   Culture     Final   Value: NO GROWTH     Performed at [facility]   Report Status 10/16/2013 FINAL   Final     Labs: Basic Metabolic Panel:  Recent Labs Lab 10/15/13 0137 10/15/13 0725 10/16/13 0540 10/18/13 0440  NA 135  --  134* 134*  K 3.4*  --  3.5 3.6  CL 98  --  101 98  CO2 26  --  27 28  GLUCOSE 111*  --  259* 222*  BUN 24*  --  22 16  CREATININE 0.65 0.72 0.78 0.60  CALCIUM 9.0  --  8.3* 8.9  PHOS  --   --   --  1.7*   Liver Function Tests:  Recent Labs Lab 10/15/13 0137 10/18/13 0440  AST 31  --   ALT 15  --   ALKPHOS 109  --   BILITOT 0.9  --   PROT 8.0  --   ALBUMIN 3.2* 2.4*   No results found for this basename: LIPASE, AMYLASE,  in the last 168 hours No results found for this basename: AMMONIA,  in the last 168 hours CBC:  Recent Labs Lab 10/15/13 0137 10/15/13 0725 10/16/13 0540 10/18/13 0440  WBC 9.8 11.8* 8.2 5.2  NEUTROABS 7.9*  --   --   --   HGB 14.0 11.9* 11.9* 12.4*  HCT 39.5 34.9* 35.4* 36.2*  MCV 90.6 91.4 92.7 91.4  PLT 122* 110* 111* 131*   Cardiac Enzymes: No results found for this basename: CKTOTAL, CKMB, CKMBINDEX, TROPONINI,  in the last 168 hours BNP: BNP (last 3 results) No results found for this basename: PROBNP,  in the last 8760 hours CBG:  Recent Labs Lab 10/18/13 1701 10/18/13 2007 10/19/13 0015 10/19/13 0359 10/19/13 0727  GLUCAP 94 115* 245* 146* 144*       Signed:  Rhetta Mura  [facility] 10/19/2013, 12:22 PM

## 2013-10-19 NOTE — Progress Notes (Signed)
CARE MANAGEMENT NOTE 10/19/2013  Patient:  DAEL, HOWLAND   Account Number:  [id number]  Date Initiated:  10/15/2013  Documentation initiated by:  [facility]  Subjective/Objective Assessment:   79 year old male admitted with HCAP and cellulitis.     Action/Plan:   From home. Lives with a friend that takes care of him.   Anticipated DC Date:  10/19/2013   Anticipated DC Plan:  HOME/SELF CARE  DC Planning Services   CM consult        Status of service:  In process, will continue to follow  Medicare Important Message given?  NA - LOS <3 / Initial given by admissions    Discharge Disposition:  HOME/SELF CARE  Per UR Regulation:  Reviewed for med. necessity/level of care/duration of stay  Comments:  10/19/13 Algernon Huxley [phone] I met with pt at bedside to discuss d/c planning. He stated he has a lady that lives with him and takes care of him. He denied need for [facility] services at this time. His grandson is going to transport him home.

## 2013-10-21 LAB — CULTURE, BLOOD (ROUTINE X 2): Culture: NO GROWTH

## 2013-11-09 ENCOUNTER — Other Ambulatory Visit (HOSPITAL_COMMUNITY): Payer: Self-pay | Admitting: Interventional Radiology

## 2013-11-09 ENCOUNTER — Ambulatory Visit (HOSPITAL_COMMUNITY)
Admission: RE | Admit: 2013-11-09 | Discharge: 2013-11-09 | Disposition: A | Discharge status: Home / Self Care | Payer: Medicare Other | Source: Ambulatory Visit | Attending: Interventional Radiology | Admitting: Interventional Radiology

## 2013-11-09 DIAGNOSIS — Y833 Surgical operation with formation of external stoma as the cause of abnormal reaction of the patient, or of later complication, without mention of misadventure at the time of the procedure: Secondary | ICD-10-CM | POA: Insufficient documentation

## 2013-11-09 DIAGNOSIS — R633 Feeding difficulties: Secondary | ICD-10-CM

## 2013-11-09 DIAGNOSIS — IMO0002 Reserved for concepts with insufficient information to code with codable children: Secondary | ICD-10-CM | POA: Insufficient documentation

## 2013-11-09 MED ORDER — IOHEXOL 300 MG/ML  SOLN
10.0000 mL | Freq: Once | INTRAMUSCULAR | Status: AC | PRN
  Administered 2013-11-09: 10 mL

## 2013-12-05 ENCOUNTER — Encounter (HOSPITAL_COMMUNITY): Payer: Self-pay | Admitting: Emergency Medicine

## 2013-12-05 ENCOUNTER — Emergency Department (HOSPITAL_COMMUNITY): Payer: Medicare Other

## 2013-12-05 ENCOUNTER — Emergency Department (HOSPITAL_COMMUNITY)
Admission: EM | Admit: 2013-12-05 | Discharge: 2013-12-05 | Disposition: A | Discharge status: Home / Self Care | Payer: Medicare Other | Attending: Emergency Medicine | Admitting: Emergency Medicine

## 2013-12-05 DIAGNOSIS — Z862 Personal history of diseases of the blood and blood-forming organs and certain disorders involving the immune mechanism: Secondary | ICD-10-CM | POA: Insufficient documentation

## 2013-12-05 DIAGNOSIS — Z87891 Personal history of nicotine dependence: Secondary | ICD-10-CM | POA: Insufficient documentation

## 2013-12-05 DIAGNOSIS — E785 Hyperlipidemia, unspecified: Secondary | ICD-10-CM | POA: Insufficient documentation

## 2013-12-05 DIAGNOSIS — I10 Essential (primary) hypertension: Secondary | ICD-10-CM | POA: Insufficient documentation

## 2013-12-05 DIAGNOSIS — Z8701 Personal history of pneumonia (recurrent): Secondary | ICD-10-CM | POA: Insufficient documentation

## 2013-12-05 DIAGNOSIS — Z7982 Long term (current) use of aspirin: Secondary | ICD-10-CM | POA: Insufficient documentation

## 2013-12-05 DIAGNOSIS — Z8679 Personal history of other diseases of the circulatory system: Secondary | ICD-10-CM | POA: Insufficient documentation

## 2013-12-05 DIAGNOSIS — K9423 Gastrostomy malfunction: Secondary | ICD-10-CM | POA: Insufficient documentation

## 2013-12-05 DIAGNOSIS — E119 Type 2 diabetes mellitus without complications: Secondary | ICD-10-CM | POA: Insufficient documentation

## 2013-12-05 DIAGNOSIS — Z79899 Other long term (current) drug therapy: Secondary | ICD-10-CM | POA: Insufficient documentation

## 2013-12-05 DIAGNOSIS — T85598A Other mechanical complication of other gastrointestinal prosthetic devices, implants and grafts, initial encounter: Secondary | ICD-10-CM

## 2013-12-05 MED ORDER — OXYCODONE-ACETAMINOPHEN 5-325 MG PO TABS
1.0000 | ORAL_TABLET | Freq: Once | ORAL | Status: AC
  Administered 2013-12-05: 1 via ORAL
  Filled 2013-12-05: qty 1

## 2013-12-05 MED ORDER — OXYCODONE-ACETAMINOPHEN 5-325 MG PO TABS
1.0000 | ORAL_TABLET | Freq: Once | ORAL | Status: AC
  Filled 2013-12-05: qty 1

## 2013-12-05 MED ORDER — IOHEXOL 300 MG/ML  SOLN
50.0000 mL | Freq: Once | INTRAMUSCULAR | Status: AC | PRN
  Administered 2013-12-05: 50 mL via ORAL

## 2013-12-05 NOTE — ED Notes (Signed)
Pt states he has a feeding tube and somehow pulled it out  Pt states the balloon part came out but the tube is still in place

## 2013-12-05 NOTE — ED Provider Notes (Signed)
Medical screening examination/treatment/procedure(s) were conducted as a shared visit with non-physician practitioner(s) and myself.  I personally evaluated the patient during the encounter    Loren Racer, MD 12/05/13 0600

## 2013-12-05 NOTE — ED Provider Notes (Addendum)
CSN: [id number]     Arrival date & time 12/05/13  0007 History   First MD Initiated Contact with Patient 12/05/13 0044     Chief Complaint  Patient presents with  . feeding tube problems    (Consider location/radiation/quality/duration/timing/severity/associated sxs/prior Treatment) HPI Comments: Patient has  J tube for feeding, when he turned over in bed accidentally pulled the tube partially out  The history is provided by the patient.    Past Medical History  Diagnosis Date  . DM2 (diabetes mellitus, type 2)   . Hyperlipidemia   . Hypertension   . Cerebral vascular disease   . HCC (hepatocellular carcinoma)   . Blood dyscrasia     THROMBCYTOPATHIA  . Peripheral vascular disease   . Pneumonia 11/03/2012   Past Surgical History  Procedure Laterality Date  . Carotid stent  2002  . Liver canc    . Posterior cervical fusion/foraminotomy  09/25/2012    Procedure: POSTERIOR CERVICAL FUSION/FORAMINOTOMY LEVEL 2;  Surgeon: Tia Alert, MD;  Location: [facility] NEURO ORS;  Service: Neurosurgery;  Laterality: N/A;  Posterior Cervical three-four laminectomy, posterior cervical three-four, four-five fusion  . Esophagogastroduodenoscopy  10/01/2012    Procedure: ESOPHAGOGASTRODUODENOSCOPY (EGD);  Surgeon: Cherylynn Ridges, MD;  Location: [facility] ENDOSCOPY;  Service: General;  Laterality: N/A;  wyatt/leone  . Peg placement  10/01/2012    Procedure: PERCUTANEOUS ENDOSCOPIC GASTROSTOMY (PEG) PLACEMENT;  Surgeon: Cherylynn Ridges, MD;  Location: [facility] ENDOSCOPY;  Service: General;  Laterality: N/A;  . Esophagogastroduodenoscopy  10/31/2012    Procedure: ESOPHAGOGASTRODUODENOSCOPY (EGD);  Surgeon: Theda Belfast, MD;  Location: [facility] ENDOSCOPY;  Service: Endoscopy;  Laterality: N/A;   History reviewed. No pertinent family history. History  Substance Use Topics  . Smoking status: Former Smoker -- 1.00 packs/day for 50 years    Types: Cigarettes    Quit date: 01/01/2012  . Smokeless tobacco: Never Used  . Alcohol  Use: No     Comment: former    Review of Systems  Constitutional: Negative for fever and chills.  Respiratory: Negative for cough and shortness of breath.   Gastrointestinal: Negative for nausea, abdominal pain and abdominal distention.  All other systems reviewed and are negative.    Allergies  Review of patient's allergies indicates no known allergies.  Home Medications   Current Outpatient Rx  Name  Route  Sig  Dispense  Refill  . albuterol (PROVENTIL) (5 MG/ML) 0.5% nebulizer solution   Nebulization   Take 0.5 mLs (2.5 mg total) by nebulization every 6 (six) hours.   260 mL   6   . aspirin EC 81 MG tablet   Oral   Take 81 mg by mouth daily.         . clindamycin (CLEOCIN) 75 MG/5ML solution   Per Tube   Place 20 mLs (300 mg total) into feeding tube every 6 (six) hours.   100 mL   0     Stop date = 10/28   . cyproheptadine (PERIACTIN) 4 MG tablet   Oral   Take 4 mg by mouth every evening.         . feeding supplement, ENSURE COMPLETE, (ENSURE COMPLETE) LIQD   Oral   Take 237 mLs by mouth 2 (two) times daily between meals.   60 Bottle   0   . Fluticasone-Salmeterol (ADVAIR) 250-50 MCG/DOSE AEPB   Inhalation   Inhale 1 puff into the lungs every 12 (twelve) hours.         Marland Kitchen ipratropium (  ATROVENT) 0.02 % nebulizer solution   Nebulization   Take 2.5 mLs (0.5 mg total) by nebulization every 6 (six) hours.   260 mL   6   . levothyroxine (SYNTHROID, LEVOTHROID) 88 MCG tablet   Oral   Take 88 mcg by mouth daily before breakfast.         . linagliptin (TRADJENTA) 5 MG TABS tablet   Oral   Take 5 mg by mouth daily.          . mirtazapine (REMERON) 30 MG tablet   Oral   Take 30 mg by mouth at bedtime.         . Nutritional Supplements (FEEDING SUPPLEMENT, OSMOLITE 1.5 CAL,) LIQD   Per Tube   Place 1,000 mLs into feeding tube continuous.   1000 mL   0     12 hours at night   . oxyCODONE (OXY IR/ROXICODONE) 5 MG immediate release tablet    Oral   Take 10 mg by mouth every 4 (four) hours as needed for pain.         Marland Kitchen oxyCODONE-acetaminophen (PERCOCET/ROXICET) 5-325 MG per tablet   Oral   Take 1 tablet by mouth every 4 (four) hours as needed for pain.         . Water For Irrigation, Sterile (FREE WATER) SOLN   Per Tube   Place 200 mLs into feeding tube every 8 (eight) hours.          BP 147/80  Pulse 72  Temp(Src) 98.4 F (36.9 C) (Oral)  Resp 20  Wt 120 lb 3.2 oz (54.522 kg)  SpO2 97% Physical Exam  Constitutional: He is oriented to person, place, and time. He appears well-developed and well-nourished.  Eyes: Pupils are equal, round, and reactive to light.  Neck: Normal range of motion.  Cardiovascular: Normal rate and regular rhythm.   Pulmonary/Chest: Effort normal.  Abdominal: Soft. He exhibits distension. There is no tenderness.  Into partially in place.  Balloon visible, well-healed insertion site  Musculoskeletal: Normal range of motion.  Neurological: He is alert and oriented to person, place, and time.    ED Course  Procedures (including critical care time) Labs Review Labs Reviewed - No data to display Imaging Review Dg Abd 1 View  12/05/2013   CLINICAL DATA:  PEG tube placement.  EXAM: ABDOMEN - 1 VIEW  COMPARISON:  Film from PEG tube placement performed 11/09/2013  FINDINGS: Injection of contrast through the patient's PEG tube demonstrates successful placement of the tube across the pylorus and into the duodenum and proximal jejunum. Contrast is seen filling the jejunum.  The visualized bowel gas pattern is grossly unremarkable. Scattered postoperative changes noted about the abdomen. No acute osseous abnormalities are seen. The visualized lung bases are grossly clear.  IMPRESSION: Successful placement of PEG tube across the pylorus and into the duodenum and proximal jejunum.   Electronically Signed   By: Roanna Raider M.D.   On: 12/05/2013 01:39    EKG Interpretation   None       MDM    1. Feeding tube dysfunction, initial encounter    balloon only contained 2 cc saline  This was removed the tube was repositioned and 7 cc saline instilled will now obtain KUB with gastrografin to ascertain placement  Tube placement verified  Will give pain medication and DC patient home    Arman Filter, NP 12/05/13 0149  Arman Filter, NP 12/05/13 0159  Arman Filter, NP 12/10/13 2232

## 2013-12-19 NOTE — ED Provider Notes (Signed)
Medical screening examination/treatment/procedure(s) were performed by non-physician practitioner and as supervising physician I was immediately available for consultation/collaboration.   Loren Racer, MD 12/19/13 [phone]

## 2014-01-21 ENCOUNTER — Other Ambulatory Visit (HOSPITAL_COMMUNITY): Payer: Self-pay | Admitting: Interventional Radiology

## 2014-01-21 DIAGNOSIS — K9423 Gastrostomy malfunction: Secondary | ICD-10-CM

## 2014-01-22 ENCOUNTER — Other Ambulatory Visit (HOSPITAL_COMMUNITY): Payer: Self-pay | Admitting: Interventional Radiology

## 2014-01-22 ENCOUNTER — Ambulatory Visit (HOSPITAL_COMMUNITY)
Admission: RE | Admit: 2014-01-22 | Discharge: 2014-01-22 | Disposition: A | Discharge status: Home / Self Care | Payer: Medicare Other | Source: Ambulatory Visit | Attending: Interventional Radiology | Admitting: Interventional Radiology

## 2014-01-22 DIAGNOSIS — K9423 Gastrostomy malfunction: Secondary | ICD-10-CM

## 2014-01-22 DIAGNOSIS — K9403 Colostomy malfunction: Secondary | ICD-10-CM | POA: Insufficient documentation

## 2014-01-22 DIAGNOSIS — Y833 Surgical operation with formation of external stoma as the cause of abnormal reaction of the patient, or of later complication, without mention of misadventure at the time of the procedure: Secondary | ICD-10-CM | POA: Insufficient documentation

## 2014-01-22 DIAGNOSIS — K9413 Enterostomy malfunction: Principal | ICD-10-CM | POA: Insufficient documentation

## 2014-01-22 MED ORDER — IOHEXOL 300 MG/ML  SOLN
20.0000 mL | Freq: Once | INTRAMUSCULAR | Status: AC | PRN
  Administered 2014-01-22: 20 mL

## 2014-04-13 ENCOUNTER — Other Ambulatory Visit: Payer: Self-pay | Admitting: Critical Care Medicine

## 2014-06-01 ENCOUNTER — Ambulatory Visit (INDEPENDENT_AMBULATORY_CARE_PROVIDER_SITE_OTHER): Payer: PRIVATE HEALTH INSURANCE | Admitting: Physician Assistant

## 2014-06-01 ENCOUNTER — Encounter: Payer: Self-pay | Admitting: Physician Assistant

## 2014-06-01 VITALS — BP 110/60 | HR 61 | Ht 69.0 in | Wt 126.0 lb

## 2014-06-01 DIAGNOSIS — I4892 Unspecified atrial flutter: Secondary | ICD-10-CM

## 2014-06-01 DIAGNOSIS — R42 Dizziness and giddiness: Secondary | ICD-10-CM

## 2014-06-01 DIAGNOSIS — I739 Peripheral vascular disease, unspecified: Secondary | ICD-10-CM

## 2014-06-01 DIAGNOSIS — I10 Essential (primary) hypertension: Secondary | ICD-10-CM

## 2014-06-01 DIAGNOSIS — I4891 Unspecified atrial fibrillation: Secondary | ICD-10-CM

## 2014-06-01 NOTE — Assessment & Plan Note (Signed)
We'll reassess carotid Dopplers next available appointment. Last study was done 3 years ago.

## 2014-06-01 NOTE — Patient Instructions (Signed)
1.  keep up with fluid intake particularly if you're outside in the heat working. 2. If any further symptoms or problems with dizziness please cough is for appointment. Otherwise, he can follow up with Dr. Gwenlyn Found in 3 months.

## 2014-06-01 NOTE — Assessment & Plan Note (Signed)
Blood pressure is well-controlled at this time in the office. Patient did report some episodes of hypertension recently. We'll need to continue to monitor.  Continue current therapy

## 2014-06-01 NOTE — Progress Notes (Signed)
Date:  06/01/2014   ID:  [name], DOB 06-02-1931, MRN [id number]  PCP:  Conni Slipper, NP  Primary Cardiologist:  Gwenlyn Found    History of Present Illness: [name] is a 80 y.o. male patient is an 80 year old male with a history of atrial fibrillation/flutter, hypothyroidism, PSVT status post RF ablation by Dr. Elta Guadeloupe PO 01/23/2005 for what appeared to be an AV nodal reentrant tachycardia, hyperlipidemia, hypertension, non-insulin requiring diabetes, peripheral arterial disease with known occluded right internal carotid arteries is post stenting of the left internal carotid artery by Dr. Herbie Baltimore person in [place] 2002.  Patient states he drinks approximately 3 beers per week. His last carotid Dopplers were done in 2012.  Patient presents today for evaluation of dizziness. He was seen by Dr. Fuller Plan at their office yesterday he was orthostatic and treated for dehydration with normal saline. Patient reports no further dizziness since receiving the fluids.  The patient currently denies nausea, vomiting, fever, chest pain, shortness of breath, orthopnea, dizziness, PND, cough, congestion, abdominal pain, hematochezia, melena, lower extremity edema.  Wt Readings from Last 3 Encounters:  06/01/14 126 lb (57.153 kg)  12/05/13 120 lb 3.2 oz (54.522 kg)  10/15/13 120 lb (54.432 kg)     Past Medical History  Diagnosis Date  . DM2 (diabetes mellitus, type 2)   . Hyperlipidemia   . Hypertension   . Cerebral vascular disease   . [place] (hepatocellular carcinoma)   . Blood dyscrasia     THROMBCYTOPATHIA  . Peripheral vascular disease   . Pneumonia 11/03/2012    Current Outpatient Prescriptions  Medication Sig Dispense Refill  . albuterol (PROVENTIL) (2.5 MG/3ML) 0.083% nebulizer solution USE 1 VIAL EVERY 6 HOURS  300 mL  0  . albuterol (PROVENTIL) (5 MG/ML) 0.5% nebulizer solution Take 0.5 mLs (2.5 mg total) by nebulization every 6 (six) hours.  260 mL  6  . aspirin EC 81 MG tablet  Take 81 mg by mouth daily.      . clindamycin (CLEOCIN) 75 MG/5ML solution Place 20 mLs (300 mg total) into feeding tube every 6 (six) hours.  100 mL  0  . cyproheptadine (PERIACTIN) 4 MG tablet Take 4 mg by mouth every evening.      . feeding supplement, ENSURE COMPLETE, (ENSURE COMPLETE) LIQD Take 237 mLs by mouth 2 (two) times daily between meals.  60 Bottle  0  . Fluticasone-Salmeterol (ADVAIR) 250-50 MCG/DOSE AEPB Inhale 1 puff into the lungs every 12 (twelve) hours.      Marland Kitchen ipratropium (ATROVENT) 0.02 % nebulizer solution Take 2.5 mLs (0.5 mg total) by nebulization every 6 (six) hours.  260 mL  6  . levothyroxine (SYNTHROID, LEVOTHROID) 88 MCG tablet Take 88 mcg by mouth daily before breakfast.      . linagliptin (TRADJENTA) 5 MG TABS tablet Take 5 mg by mouth daily.       . mirtazapine (REMERON) 30 MG tablet Take 30 mg by mouth at bedtime.      . Nutritional Supplements (FEEDING SUPPLEMENT, OSMOLITE 1.5 CAL,) LIQD Place 1,000 mLs into feeding tube continuous.  1000 mL  0  . oxyCODONE (OXY IR/ROXICODONE) 5 MG immediate release tablet Take 10 mg by mouth every 4 (four) hours as needed for pain.      Marland Kitchen oxyCODONE-acetaminophen (PERCOCET/ROXICET) 5-325 MG per tablet Take 1 tablet by mouth every 4 (four) hours as needed for pain.      . Water For Irrigation, Sterile (FREE WATER) SOLN Place 200 mLs  into feeding tube every 8 (eight) hours.       No current facility-administered medications for this visit.    Allergies:   No Known Allergies  Social History:  The patient  reports that he quit smoking about 2 years ago. His smoking use included Cigarettes. He has a 50 pack-year smoking history. He has never used smokeless tobacco. He reports that he does not drink alcohol or use illicit drugs.   Family history:  No family history on file.  ROS:  Please see the history of present illness.  All other systems reviewed and negative.   PHYSICAL EXAM: VS:  BP 110/60  Pulse 61  Ht 5\' 9"  (1.753 m)   Wt 126 lb (57.153 kg)  BMI 18.60 kg/m2 In, well developed, in no acute distress HEENT: Pupils are equal round react to light accommodation extraocular movements are intact.  Neck: Carotid bruit, left. Cardiac: Irregular rate and rhythm  without murmurs rubs or gallops. Lungs:  clear to auscultation bilaterally, no wheezing, rhonchi or rales Ext: no lower extremity edema.  2+ right radial 1+ left radial and 0 dorsalis pedis pulses. Both feet are warm Skin: warm and dry Neuro:  Grossly normal  EKG:  Atrial fibrillation rate of 61 beats per minute.  No acute changes. Prior   ASSESSMENT AND PLAN:  Problem List Items Addressed This Visit   PVD (peripheral vascular disease), Hx of occl RICA, LICA stent 2725 (Chronic)     We'll reassess carotid Dopplers next available appointment. Last study was done 3 years ago.  He does have claudication in the LE also and poor distal pulses.  Will check LEA dopplers also.    Hypertension     Blood pressure is well-controlled at this time in the office. Patient did report some episodes of hypertension recently. We'll need to continue to monitor.  Continue current therapy    Atrial flutter, recurrent, history of RFA in 2006, at times brady.     Atrial fibrillation well-controlled currently in the 60s.  Patient is not on anticoagulation and does not appear to have ever been.    Dizziness     Patient seen Dr. Lynne Leader office yesterday and was treated for dehydration with IV saline. Patient reports no symptoms of dizziness since.  We did check his orthostatic vitals here in the office and they were normal abnormal.  I recommended that he fill 1 L bottle full of water and drink at least 2 daily.  If he has any further dizziness, he should call us back immediately.  Lying 120/70 , sitting 100/60, standing 90/50     Other Visit Diagnoses   Atrial fibrillation    -  Primary    Relevant Orders       EKG 12-Lead    PAD (peripheral artery disease)        Relevant  Orders       Carotid duplex

## 2014-06-01 NOTE — Assessment & Plan Note (Addendum)
Atrial fibrillation well-controlled currently in the 60s.  Patient is not on anticoagulation and does not appear to have ever been.

## 2014-06-01 NOTE — Assessment & Plan Note (Addendum)
Patient seen Dr. Lynne Leader office yesterday and was treated for dehydration with IV saline. Patient reports no symptoms of dizziness since.  We did check his orthostatic vitals here in the office and they were normal abnormal.  I recommended that he fill 1 L bottle full of water and drink at least 2 daily.  She reports any further dizziness he should call us back immediately.  Lying 120/70 , sitting 100/60, standing 90/50

## 2014-06-02 ENCOUNTER — Ambulatory Visit (HOSPITAL_COMMUNITY)
Admission: RE | Admit: 2014-06-02 | Discharge: 2014-06-02 | Disposition: A | Discharge status: Home / Self Care | Payer: PRIVATE HEALTH INSURANCE | Source: Ambulatory Visit | Attending: Cardiovascular Disease | Admitting: Cardiovascular Disease

## 2014-06-02 ENCOUNTER — Ambulatory Visit (HOSPITAL_BASED_OUTPATIENT_CLINIC_OR_DEPARTMENT_OTHER)
Admission: RE | Admit: 2014-06-02 | Discharge: 2014-06-02 | Disposition: A | Discharge status: Home / Self Care | Payer: PRIVATE HEALTH INSURANCE | Source: Ambulatory Visit | Attending: Cardiovascular Disease | Admitting: Cardiovascular Disease

## 2014-06-02 ENCOUNTER — Telehealth (HOSPITAL_COMMUNITY): Payer: Self-pay | Admitting: *Deleted

## 2014-06-02 DIAGNOSIS — I739 Peripheral vascular disease, unspecified: Secondary | ICD-10-CM | POA: Insufficient documentation

## 2014-06-02 NOTE — Progress Notes (Deleted)
Lower Extremity Arterial Duplex Completed. °Brianna L Mazza,RVT °

## 2014-06-02 NOTE — Progress Notes (Signed)
Lower Extremity Arterial Duplex Completed. °Brianna L Mazza,RVT °

## 2014-06-02 NOTE — Progress Notes (Signed)
Carotid Duplex Completed. °Brianna L Mazza,RVT °

## 2014-06-09 ENCOUNTER — Ambulatory Visit (INDEPENDENT_AMBULATORY_CARE_PROVIDER_SITE_OTHER): Payer: PRIVATE HEALTH INSURANCE | Admitting: Cardiovascular Disease

## 2014-06-09 ENCOUNTER — Encounter: Payer: Self-pay | Admitting: Cardiovascular Disease

## 2014-06-09 VITALS — BP 104/68 | HR 81 | Ht 69.0 in | Wt 121.0 lb

## 2014-06-09 DIAGNOSIS — I739 Peripheral vascular disease, unspecified: Secondary | ICD-10-CM

## 2014-06-09 DIAGNOSIS — I672 Cerebral atherosclerosis: Secondary | ICD-10-CM

## 2014-06-09 DIAGNOSIS — E785 Hyperlipidemia, unspecified: Secondary | ICD-10-CM

## 2014-06-09 DIAGNOSIS — I10 Essential (primary) hypertension: Secondary | ICD-10-CM

## 2014-06-09 NOTE — Assessment & Plan Note (Signed)
History of hypertension but not on any antihypertensive medications and normotensive today

## 2014-06-09 NOTE — Progress Notes (Signed)
06/09/2014 [name]   04-27-1931  [id number]  Primary Physician ANTHONY,ARENNETTE, NP Primary Cardiologist: Lorretta Harp MD Renae Gloss   HPI:  The patient is a 80 year old, thin and chronically ill-appearing, widowed Caucasian male, father of 2 living children who I last saw in the office 2 years ago . He has a history of PSVT status post RF ablation by Dr. 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 He has had no recurrent episodes. He stopped smoking approximately 2 years ago. He drinks approximately 1 to 2 beers a night. He has hypertension, hyperlipidemia, and noninsulin-requiring diabetes. He does have a known occluded right internal carotid artery status post stenting of his left internal carotid artery by Dr. Celedonio Miyamoto in [place] in 2002. We have been following his duplex ultrasounds which have shown mild in-stent restenosis. He had a negative functional study April 30, 2011, prior to liver surgery done at [facility] because of cancer, which was successful. He was seen in the office by Tenny Craw [facility] several days ago after being sent to our office because of dizziness by Dr. Fuller Plan. Apparently he had dehydration and orthostasis that resolved with fluid resuscitation. He currently denies chest pain, shortness of breath or dizziness. He also denies [place] limiting claudication. Carotid Dopplers performed revealed a patent left internal carotid artery stent and lower extremity Dopplers to revealed occlusive disease however the patient did not appear to be symptomatic.   Current Outpatient Prescriptions  Medication Sig Dispense Refill  . albuterol (PROVENTIL) (2.5 MG/3ML) 0.083% nebulizer solution USE 1 VIAL EVERY 6 HOURS  300 mL  0  . albuterol (PROVENTIL) (5 MG/ML) 0.5% nebulizer solution Take 0.5 mLs (2.5 mg total) by nebulization every 6 (six) hours.  260 mL  6  . aspirin EC 81 MG tablet Take 81 mg by mouth daily.      .  clindamycin (CLEOCIN) 75 MG/5ML solution Place 20 mLs (300 mg total) into feeding tube every 6 (six) hours.  100 mL  0  . cyproheptadine (PERIACTIN) 4 MG tablet Take 4 mg by mouth every evening.      . feeding supplement, ENSURE COMPLETE, (ENSURE COMPLETE) LIQD Take 237 mLs by mouth 2 (two) times daily between meals.  60 Bottle  0  . Fluticasone-Salmeterol (ADVAIR) 250-50 MCG/DOSE AEPB Inhale 1 puff into the lungs every 12 (twelve) hours.      Marland Kitchen ipratropium (ATROVENT) 0.02 % nebulizer solution Take 2.5 mLs (0.5 mg total) by nebulization every 6 (six) hours.  260 mL  6  . levothyroxine (SYNTHROID, LEVOTHROID) 88 MCG tablet Take 88 mcg by mouth daily before breakfast.      . linagliptin (TRADJENTA) 5 MG TABS tablet Take 5 mg by mouth daily.       . mirtazapine (REMERON) 30 MG tablet Take 30 mg by mouth at bedtime.      . Nutritional Supplements (FEEDING SUPPLEMENT, OSMOLITE 1.5 CAL,) LIQD Place 1,000 mLs into feeding tube continuous.  1000 mL  0  . oxyCODONE (OXY IR/ROXICODONE) 5 MG immediate release tablet Take 10 mg by mouth every 4 (four) hours as needed for pain.      Marland Kitchen oxyCODONE-acetaminophen (PERCOCET/ROXICET) 5-325 MG per tablet Take 1 tablet by mouth every 4 (four) hours as needed for pain.      . Water For Irrigation, Sterile (FREE WATER) SOLN Place 200 mLs into feeding tube every 8 (eight) hours.       No  current facility-administered medications for this visit.    No Known Allergies  History   Social History  . Marital Status: Widowed    Spouse Name: N/A    Number of Children: N/A  . Years of Education: N/A   Occupational History  . Not on file.   Social History Main Topics  . Smoking status: Former Smoker -- 1.00 packs/day for 50 years    Types: Cigarettes    Quit date: 01/01/2012  . Smokeless tobacco: Never Used  . Alcohol Use: No     Comment: former  . Drug Use: No  . Sexual Activity: Not on file   Other Topics Concern  . Not on file   Social History Narrative    . No narrative on file     Review of Systems: General: negative for chills, fever, night sweats or weight changes.  Cardiovascular: negative for chest pain, dyspnea on exertion, edema, orthopnea, palpitations, paroxysmal nocturnal dyspnea or shortness of breath Dermatological: negative for rash Respiratory: negative for cough or wheezing Urologic: negative for hematuria Abdominal: negative for nausea, vomiting, diarrhea, bright red blood per rectum, melena, or hematemesis Neurologic: negative for visual changes, syncope, or dizziness All other systems reviewed and are otherwise negative except as noted above.    Blood pressure 104/68, pulse 81, height 5\' 9"  (1.753 m), weight 121 lb (54.885 kg).  General appearance: alert and no distress Neck: no adenopathy, no carotid bruit, no JVD, supple, symmetrical, trachea midline and thyroid not enlarged, symmetric, no tenderness/mass/nodules Lungs: clear to auscultation bilaterally Heart: regular rate and rhythm, S1, S2 normal, no murmur, click, rub or gallop Extremities: extremities normal, atraumatic, no cyanosis or edema  EKG not performed today  ASSESSMENT AND PLAN:   Hyperlipidemia Not on statin therapy. Followed by his PCP.  Hypertension History of hypertension but not on any antihypertensive medications and normotensive today  Cerebral atherosclerosis History of right internal carotid artery occlusion and remote left internal carotid artery stenting performed by Dr. Schuyler Amor in 2002. Recent carotid Dopplers in the office demonstrated this to be widely patent.  Peripheral arterial disease Patient does complain of mild claudication. Lower extremity arterial Doppler studies performed 06/02/14 revealed a right ABI 0.58 with high-grade disease in his right popliteal artery a left ABI of 0.7 with an occluded left SFA.      Lorretta Harp MD FACP,FACC,FAHA, [facility] 06/09/2014 3:07 PM

## 2014-06-09 NOTE — Assessment & Plan Note (Signed)
History of right internal carotid artery occlusion and remote left internal carotid artery stenting performed by Dr. Schuyler Amor in 2002. Recent carotid Dopplers in the office demonstrated this to be widely patent.

## 2014-06-09 NOTE — Assessment & Plan Note (Signed)
Patient does complain of mild claudication. Lower extremity arterial Doppler studies performed 06/02/14 revealed a right ABI 0.58 with high-grade disease in his right popliteal artery a left ABI of 0.7 with an occluded left SFA.

## 2014-06-09 NOTE — Patient Instructions (Signed)
Your physician wants you to follow-up in: 1 year with Dr Berry. You will receive a reminder letter in the mail two months in advance. If you don't receive a letter, please call our office to schedule the follow-up appointment.  

## 2014-06-09 NOTE — Assessment & Plan Note (Signed)
Not on statin therapy. Followed by his PCP.

## 2014-06-11 ENCOUNTER — Emergency Department (HOSPITAL_COMMUNITY): Payer: PRIVATE HEALTH INSURANCE

## 2014-06-11 ENCOUNTER — Emergency Department (HOSPITAL_COMMUNITY)
Admission: EM | Admit: 2014-06-11 | Discharge: 2014-06-11 | Disposition: A | Discharge status: Home / Self Care | Payer: PRIVATE HEALTH INSURANCE | Attending: Emergency Medicine | Admitting: Emergency Medicine

## 2014-06-11 ENCOUNTER — Encounter (HOSPITAL_COMMUNITY): Payer: Self-pay | Admitting: Emergency Medicine

## 2014-06-11 DIAGNOSIS — Y833 Surgical operation with formation of external stoma as the cause of abnormal reaction of the patient, or of later complication, without mention of misadventure at the time of the procedure: Secondary | ICD-10-CM | POA: Diagnosis not present

## 2014-06-11 DIAGNOSIS — Z87891 Personal history of nicotine dependence: Secondary | ICD-10-CM | POA: Diagnosis not present

## 2014-06-11 DIAGNOSIS — Z792 Long term (current) use of antibiotics: Secondary | ICD-10-CM | POA: Diagnosis not present

## 2014-06-11 DIAGNOSIS — Z7982 Long term (current) use of aspirin: Secondary | ICD-10-CM | POA: Diagnosis not present

## 2014-06-11 DIAGNOSIS — Z8505 Personal history of malignant neoplasm of liver: Secondary | ICD-10-CM | POA: Insufficient documentation

## 2014-06-11 DIAGNOSIS — IMO0002 Reserved for concepts with insufficient information to code with codable children: Secondary | ICD-10-CM | POA: Diagnosis not present

## 2014-06-11 DIAGNOSIS — L988 Other specified disorders of the skin and subcutaneous tissue: Secondary | ICD-10-CM | POA: Insufficient documentation

## 2014-06-11 DIAGNOSIS — I10 Essential (primary) hypertension: Secondary | ICD-10-CM | POA: Insufficient documentation

## 2014-06-11 DIAGNOSIS — K9420 Gastrostomy complication, unspecified: Secondary | ICD-10-CM

## 2014-06-11 DIAGNOSIS — K9429 Other complications of gastrostomy: Secondary | ICD-10-CM | POA: Insufficient documentation

## 2014-06-11 DIAGNOSIS — K9423 Gastrostomy malfunction: Secondary | ICD-10-CM

## 2014-06-11 DIAGNOSIS — E119 Type 2 diabetes mellitus without complications: Secondary | ICD-10-CM | POA: Insufficient documentation

## 2014-06-11 DIAGNOSIS — Z8673 Personal history of transient ischemic attack (TIA), and cerebral infarction without residual deficits: Secondary | ICD-10-CM | POA: Insufficient documentation

## 2014-06-11 DIAGNOSIS — Z8701 Personal history of pneumonia (recurrent): Secondary | ICD-10-CM | POA: Diagnosis not present

## 2014-06-11 DIAGNOSIS — Z862 Personal history of diseases of the blood and blood-forming organs and certain disorders involving the immune mechanism: Secondary | ICD-10-CM | POA: Insufficient documentation

## 2014-06-11 DIAGNOSIS — Z79899 Other long term (current) drug therapy: Secondary | ICD-10-CM | POA: Diagnosis not present

## 2014-06-11 LAB — CBC WITH DIFFERENTIAL/PLATELET
Basophils Absolute: 0 10*3/uL (ref 0.0–0.1)
Basophils Relative: 0 % (ref 0–1)
EOS PCT: 0 % (ref 0–5)
Eosinophils Absolute: 0 10*3/uL (ref 0.0–0.7)
HCT: 44.8 % (ref 39.0–52.0)
HEMOGLOBIN: 15.9 g/dL (ref 13.0–17.0)
LYMPHS ABS: 1.1 10*3/uL (ref 0.7–4.0)
Lymphocytes Relative: 8 % — ABNORMAL LOW (ref 12–46)
MCH: 33.5 pg (ref 26.0–34.0)
MCHC: 35.5 g/dL (ref 30.0–36.0)
MCV: 94.3 fL (ref 78.0–100.0)
MONO ABS: 1.3 10*3/uL — AB (ref 0.1–1.0)
Monocytes Relative: 9 % (ref 3–12)
NEUTROS ABS: 11.6 10*3/uL — AB (ref 1.7–7.7)
Neutrophils Relative %: 83 % — ABNORMAL HIGH (ref 43–77)
Platelets: 123 10*3/uL — ABNORMAL LOW (ref 150–400)
RBC: 4.75 MIL/uL (ref 4.22–5.81)
RDW: 13.5 % (ref 11.5–15.5)
WBC: 14.1 10*3/uL — AB (ref 4.0–10.5)

## 2014-06-11 LAB — BASIC METABOLIC PANEL
BUN: 38 mg/dL — ABNORMAL HIGH (ref 6–23)
CO2: 31 mEq/L (ref 19–32)
Calcium: 9.3 mg/dL (ref 8.4–10.5)
Chloride: 96 mEq/L (ref 96–112)
Creatinine, Ser: 0.63 mg/dL (ref 0.50–1.35)
GFR calc Af Amer: >90 mL/min (ref >90)
GFR calc non Af Amer: >90 mL/min (ref >90)
Glucose, Bld: 258 mg/dL — ABNORMAL HIGH (ref 70–99)
Potassium: 5 mEq/L (ref 3.7–5.3)
Sodium: 139 mEq/L (ref 137–147)

## 2014-06-11 MED ORDER — CEPHALEXIN 500 MG PO CAPS: 500.0000 mg | ORAL_CAPSULE | Freq: Four times a day (QID) | ORAL | Status: DC

## 2014-06-11 NOTE — Discharge Instructions (Signed)
Care of a Feeding Tube People who have trouble swallowing or cannot take food or medicine by mouth are sometimes given feeding tubes. A feeding tube can go into the nose and down to the stomach or through the skin in the abdomen and into the stomach or small bowel. Some of the names of these feeding tubes are gastrostomy tubes, PEG lines, nasogastric tubes, and gastrojejunostomy tubes.  SUPPLIES NEEDED TO CARE FOR THE TUBE SITE  Clean gloves.  Clean wash cloth, gauze pads, or soft paper towel.  Cotton swabs.  Skin barrier ointment or cream.  Soap and water.  Pre-cut foam pads or gauze (that go around the tube).  Tube tape. TUBE SITE CARE 1. Have all supplies ready and available. 2. Wash hands well. 3. Put on clean gloves. 4. Remove the soiled foam pad or gauze, if present, that is found under the tube stabilizer. Change the foam pad or gauze daily or when soiled or moist. 5. Check the skin around the tube site for redness, rash, swelling, drainage, or extra tissue growth. If you notice any of these, call your caregiver. 6. Moisten gauze and cotton swabs with water and soap. 7. Wipe the area closest to the tube (right near the stoma) with cotton swabs. Wipe the surrounding skin with moistened gauze. Rinse with water. 8. Dry the skin and stoma site with a dry gauze pad or soft paper towel. Do not use antibiotic ointments at the tube site. 9. If the skin is red, apply a skin barrier cream or ointment (such as petroleum jelly) in a circular motion, using a cotton swab. The cream or ointment will provide a moisture barrier for the skin and helps with wound healing. 10. Apply a new pre-cut foam pad or gauze around the tube. Secure it with tape around the edges. If no drainage is present, foam pads or gauze may be left off. 11. Use tape or an anchoring device to fasten the feeding tube to the skin for comfort or as directed. Rotate where you tape the tube to avoid skin damage from the  adhesive. 12. Position the person in a semi-upright position (30 45 degree angle). 13. Throw away used supplies. 14. Remove gloves. 15. Wash hands. SUPPLIES NEEDED TO FLUSH A FEEDING TUBE  Clean gloves.  60 mL syringe (that connects to the feeding tube).  Towel.  Water. FLUSHING A FEEDING TUBE  1. Have all supplies ready and available. 2. Wash hands well. 3. Put on clean gloves. 4. Draw up 30 mL of water in the syringe. 5. Kink the feeding tube while disconnecting it from the feeding-bag tubing or while removing the plug at the end of the tube. Kinking closes the tube and prevents secretions in the tube from spilling out. 6. Insert the tip of the syringe into the end of the feeding tube. Release the kink. Slowly inject the water. 7. If unable to inject the water, the person with the feeding tube should lay on his or her left side. The tip of the tube may be against the stomach wall, blocking fluid flow. Changing positions may move the tip away from the stomach wall. After repositioning, try injecting the water again.  Do not use a syringe smaller than 60 mL to flush the tubing.  Do not use excessive force to overcome resistance because this could cause the tube to rupture. 8. After injecting the water, remove the syringe. 9. Always flush before giving the first medicine, between medicines, and after the final  medicine before starting a feeding. This prevents medicines from clogging the tube.  Do not mix medicines with formula or with other medicines before giving medicines.  Thoroughly flush medicines through the tube so they do not mix with formula. 10. Throw away used supplies. 11. Remove gloves. 12. Wash hands. Document Released: 12/17/2005 Document Revised: 12/03/2012 Document Reviewed: 07/31/2012 [facility] Patient Information 2014 [place].

## 2014-06-11 NOTE — ED Provider Notes (Signed)
CSN: [id number]     Arrival date & time 06/11/14  1305 History   First MD Initiated Contact with Patient 06/11/14 1316     Chief Complaint  Patient presents with  . feeding tube problem     HPI Patient states he's had trouble with discomfort from his feeding tube for over the last month. Patient has been having trouble with some pain and intermittent bleeding at his PEG tube site. He states that in the morning times off and when it is hanging down they will cause some small bleeding. He's also noticed a little bit of drainage around the site. Otherwise, the tube is functioning properly. He has not had any trouble with fever. Is not having trouble with vomiting or diarrhea. He has a feeding tube in place because he's not able to swallow properly Past Medical History  Diagnosis Date  . DM2 (diabetes mellitus, type 2)   . Hyperlipidemia   . Hypertension   . Cerebral vascular disease   . [place] (hepatocellular carcinoma)   . Blood dyscrasia     THROMBCYTOPATHIA  . Peripheral vascular disease   . Pneumonia 11/03/2012   Past Surgical History  Procedure Laterality Date  . Carotid stent  2002  . Liver canc    . Posterior cervical fusion/foraminotomy  09/25/2012    Procedure: POSTERIOR CERVICAL FUSION/FORAMINOTOMY LEVEL 2;  Surgeon: Eustace Moore, MD;  Location: [place] [facility]UR[facility]S;  Service: Neurosurgery;  Laterality: N/A;  Posterior Cervical three-four laminectomy, posterior cervical three-four, four-five fusion  . Esophagogastroduodenoscopy  10/01/2012    Procedure: ESOPHAGOGASTRODUODENOSCOPY (EGD);  Surgeon: Gwenyth Ober, MD;  Location: [place];  Service: General;  Laterality: N/A;  wyatt/leone  . Peg placement  10/01/2012    Procedure: PERCUTANEOUS ENDOSCOPIC GASTROSTOMY (PEG) PLACEMENT;  Surgeon: Gwenyth Ober, MD;  Location: [place];  Service: General;  Laterality: N/A;  . Esophagogastroduodenoscopy  10/31/2012    Procedure: ESOPHAGOGASTRODUODENOSCOPY (EGD);  Surgeon: Beryle Beams, MD;   Location[facility] ENDOSCOPY;  Service: Endoscopy;  Laterality: N/A;   History reviewed. No pertinent family history. History  Substance Use Topics  . Smoking status: Former Smoker -- 1.00 packs/day for 50 years    Types: Cigarettes    Quit date: 01/01/2012  . Smokeless tobacco: Never Used  . Alcohol Use: No     Comment: former    Review of Systems  All other systems reviewed and are negative.     Allergies  Review of patient's allergies indicates no known allergies.  Home Medications   Prior to Admission medications   Medication Sig Start Date End Date Taking? Authorizing Provider  albuterol (PROVENTIL) (2.5 MG/3ML) 0.083% nebulizer solution USE 1 VIAL EVERY 6 HOURS 04/13/14   Elsie Stain, MD  aspirin EC 81 MG tablet Take 81 mg by mouth daily.    Historical Provider, MD  cephALEXin (KEFLEX) 500 MG capsule Take 1 capsule (500 mg total) by mouth 4 (four) times daily. 06/11/14   Dorie Rank, MD  clindamycin (CLEOCIN) 75 MG/5ML solution Place 20 mLs (300 mg total) into feeding tube every 6 (six) hours. 10/19/13   Nita Sells, MD  cyproheptadine (PERIACTIN) 4 MG tablet Take 4 mg by mouth every evening.    Historical Provider, MD  feeding supplement, ENSURE COMPLETE, (ENSURE COMPLETE) LIQD Take 237 mLs by mouth 2 (two) times daily between meals. 10/19/13   Nita Sells, MD  Fluticasone-Salmeterol (ADVAIR) 250-50 MCG/DOSE AEPB Inhale 1 puff into the lungs every 12 (twelve) hours.  Historical Provider, MD  ipratropium (ATROVENT) 0.02 % nebulizer solution Take 2.5 mLs (0.5 mg total) by nebulization every 6 (six) hours. 12/05/12   Elsie Stain, MD  levothyroxine (SYNTHROID, LEVOTHROID) 88 MCG tablet Take 88 mcg by mouth daily before breakfast.    Historical Provider, MD  linagliptin (TRADJENTA) 5 MG TABS tablet Take 5 mg by mouth daily.     Historical Provider, MD  mirtazapine (REMERON) 30 MG tablet Take 30 mg by mouth at bedtime.    Historical Provider, MD  Nutritional  Supplements (FEEDING SUPPLEMENT, OSMOLITE 1.5 CAL,) LIQD Place 1,000 mLs into feeding tube continuous. 10/19/13   Nita Sells, MD  oxyCODONE (OXY IR/ROXICODONE) 5 MG immediate release tablet Take 10 mg by mouth every 4 (four) hours as needed for pain.    Historical Provider, MD  oxyCODONE-acetaminophen (PERCOCET/ROXICET) 5-325 MG per tablet Take 1 tablet by mouth every 4 (four) hours as needed for pain.    Historical Provider, MD  Water For Irrigation, Sterile (FREE WATER) SOLN Place 200 mLs into feeding tube every 8 (eight) hours. 10/19/13   Nita Sells, MD   BP 148/53  Pulse 78  Temp(Src) 98 F (36.7 C) (Oral)  Resp 20  SpO2 95% Physical Exam  Nursing note and vitals reviewed. Constitutional: No distress.  HENT:  Head: Normocephalic and atraumatic.  Right Ear: External ear normal.  Left Ear: External ear normal.  Eyes: Conjunctivae are normal. Right eye exhibits no discharge. Left eye exhibits no discharge. No scleral icterus.  Neck: Neck supple. No tracheal deviation present.  Cardiovascular: Normal rate, regular rhythm and intact distal pulses.   Pulmonary/Chest: Effort normal and breath sounds normal. No stridor. No respiratory distress. He has no wheezes. He has no rales.  Abdominal: Soft. Bowel sounds are normal. He exhibits no distension. There is no tenderness. There is no rebound and no guarding.  Small amount of mucoid brown-type drainage around his ostomy tube, mild erythema around the skin, appears to have a small skin lesion  at the ostomy tube site, no active bleeding, no increased warmth  Musculoskeletal: He exhibits no edema and no tenderness.  Neurological: He is alert. He has normal strength. No cranial nerve deficit (no facial droop, extraocular movements intact, no slurred speech) or sensory deficit. He exhibits normal muscle tone. He displays no seizure activity. Coordination normal.  Skin: Skin is warm and dry. No rash noted. He is not diaphoretic.   Psychiatric: He has a normal mood and affect.    ED Course  Procedures (including critical care time) Labs Review Labs Reviewed  CBC WITH DIFFERENTIAL - Abnormal; Notable for the following:    WBC 14.1 (*)    Platelets 123 (*)    Neutrophils Relative % 83 (*)    Neutro Abs 11.6 (*)    Lymphocytes Relative 8 (*)    Monocytes Absolute 1.3 (*)    All other components within normal limits  BASIC METABOLIC PANEL - Abnormal; Notable for the following:    Glucose, Bld 258 (*)    BUN 38 (*)    All other components within normal limits    Imaging Review Dg Abd 1 View  06/11/2014   CLINICAL DATA:  Feeding tube, abdominal pain.  EXAM: ABDOMEN - 1 VIEW  COMPARISON:  01/22/2014  FINDINGS: There is evidence of patient's percutaneous gastrojejunostomy tube with tip just left of midline in the mid abdomen in the proximal jejunum. There is a single surgical clip over the left upper quadrant. Bowel gas pattern  is nonobstructive with mild fecal retention throughout the colon. Remainder the exam is unchanged.  IMPRESSION: Nonobstructive bowel gas pattern.  Percutaneous gastrojejunostomy with tip over the mid abdomen in the proximal jejunum.   Electronically Signed   By: Marin Olp M.D.   On: 06/11/2014 13:46   PEG tube cleaned.  Dressing applied.  Pt noticed improvement   MDM   Final diagnoses:  Complication of feeding tube   Pt has some mild skin irritation.  Pt is feeling well.  Good appetite.  Slight increase in WBC.  Will rx keflex.  Discussed local wound care treatment.  Follow up with PCP    Dorie Rank, MD 06/11/14 [phone]

## 2014-06-11 NOTE — ED Notes (Signed)
Pt coming from home with c/o feeding tube problem; pt sts for the last month he has been having bleeding and pain at the peg tube site, but he didn't want to come to ed.

## 2014-06-11 NOTE — Progress Notes (Signed)
  CARE MANAGEMENT ED NOTE 06/11/2014  Patient:  [name], [name]   Account Number:  [id number]  Date Initiated:  06/11/2014  Documentation initiated by:  Livia Snellen  Subjective/Objective Assessment:   Patient presents to Ed with discomfort from his feeding tube.     Subjective/Objective Assessment Detail:     Action/Plan:   Action/Plan Detail:   Anticipated DC Date:  06/11/2014     Status Recommendation to Physician:   Result of Recommendation:    Other ED Services  Consult Working [facility]  Other    Choice offered to / List presented to:            Status of service:  Completed, signed off  ED Comments:   ED Comments Detail:  EDCm spoke to patient at bedside.  Patient rpeorts he lives at home with a "lady" for about 10-14 years.  Patient reports that a nurse from [facility] comes out to his home every six months to "check his tube."  [facility] sent text to Alben Spittle for [facility] who reports patient has not been seen by them since 2013. EDCM reported this to patient and patient was very adament that someone comes out to his house and checks his tube.  Patient reports he is 'getting along" with his feeding tube just fine at home, hasn't has any trouble until now.  Patient reports he has a pump at home for his tube feedings and a"breathing machine"  (a nebulizer).  Patient reports he is able to complete all of his ADL's on his own.  Patient reports he is still driving. [facility] asked patient if he needed someone to come to his house to check on his feeding tube, patient responded, "No."  EDCM discussed with EDP who wants patient to follow up with his pcp Dr. Karle Starch.  [facility] informed patient to follow up with his pcp Dr. Karle Starch and if Dr. Karle Starch feels he needs home health at that time, she can arrange it. Patient agrreable to this plan.  Patient reports he has an appointment with Dr. Karle Starch on August first.  [facility] encouraged patient to call pcps office to make an appointment sooner.   Patient verbalized understanding.  No further EDCM needs at this time.

## 2014-06-15 ENCOUNTER — Telehealth: Payer: Self-pay | Admitting: *Deleted

## 2014-06-15 DIAGNOSIS — I6529 Occlusion and stenosis of unspecified carotid artery: Secondary | ICD-10-CM

## 2014-06-15 NOTE — Telephone Encounter (Signed)
Order placed for repeat carotid dopplers in 6 months  

## 2014-06-15 NOTE — Telephone Encounter (Signed)
Message copied by Chauncy Lean on Tue Jun 15, 2014  1:00 PM ------      Message from: Lorretta Harp      Created: Wed Jun 09, 2014  7:06 PM       No change from prior study. Repeat in 6 months ------

## 2014-07-18 ENCOUNTER — Other Ambulatory Visit: Payer: Self-pay | Admitting: Critical Care Medicine

## 2014-09-10 ENCOUNTER — Observation Stay (HOSPITAL_COMMUNITY)
Admission: EM | Admit: 2014-09-10 | Discharge: 2014-09-12 | Disposition: A | Discharge status: Home / Self Care | Payer: PRIVATE HEALTH INSURANCE | Attending: Family Medicine | Admitting: Family Medicine

## 2014-09-10 ENCOUNTER — Encounter (HOSPITAL_COMMUNITY): Payer: Self-pay | Admitting: Emergency Medicine

## 2014-09-10 ENCOUNTER — Emergency Department (HOSPITAL_COMMUNITY): Payer: PRIVATE HEALTH INSURANCE

## 2014-09-10 DIAGNOSIS — E039 Hypothyroidism, unspecified: Secondary | ICD-10-CM | POA: Diagnosis present

## 2014-09-10 DIAGNOSIS — I6529 Occlusion and stenosis of unspecified carotid artery: Secondary | ICD-10-CM | POA: Insufficient documentation

## 2014-09-10 DIAGNOSIS — E1121 Type 2 diabetes mellitus with diabetic nephropathy: Secondary | ICD-10-CM

## 2014-09-10 DIAGNOSIS — I708 Atherosclerosis of other arteries: Secondary | ICD-10-CM | POA: Diagnosis not present

## 2014-09-10 DIAGNOSIS — I10 Essential (primary) hypertension: Secondary | ICD-10-CM | POA: Insufficient documentation

## 2014-09-10 DIAGNOSIS — J449 Chronic obstructive pulmonary disease, unspecified: Secondary | ICD-10-CM | POA: Diagnosis present

## 2014-09-10 DIAGNOSIS — Z87891 Personal history of nicotine dependence: Secondary | ICD-10-CM | POA: Insufficient documentation

## 2014-09-10 DIAGNOSIS — F17201 Nicotine dependence, unspecified, in remission: Secondary | ICD-10-CM

## 2014-09-10 DIAGNOSIS — I4891 Unspecified atrial fibrillation: Secondary | ICD-10-CM

## 2014-09-10 DIAGNOSIS — I4892 Unspecified atrial flutter: Secondary | ICD-10-CM | POA: Diagnosis not present

## 2014-09-10 DIAGNOSIS — E119 Type 2 diabetes mellitus without complications: Secondary | ICD-10-CM

## 2014-09-10 DIAGNOSIS — E43 Unspecified severe protein-calorie malnutrition: Secondary | ICD-10-CM | POA: Diagnosis present

## 2014-09-10 DIAGNOSIS — Z981 Arthrodesis status: Secondary | ICD-10-CM | POA: Diagnosis not present

## 2014-09-10 DIAGNOSIS — I739 Peripheral vascular disease, unspecified: Secondary | ICD-10-CM | POA: Diagnosis not present

## 2014-09-10 DIAGNOSIS — E40 Kwashiorkor: Secondary | ICD-10-CM | POA: Insufficient documentation

## 2014-09-10 DIAGNOSIS — R0609 Other forms of dyspnea: Secondary | ICD-10-CM

## 2014-09-10 DIAGNOSIS — R1312 Dysphagia, oropharyngeal phase: Secondary | ICD-10-CM | POA: Diagnosis present

## 2014-09-10 DIAGNOSIS — Z7982 Long term (current) use of aspirin: Secondary | ICD-10-CM | POA: Diagnosis not present

## 2014-09-10 DIAGNOSIS — R0989 Other specified symptoms and signs involving the circulatory and respiratory systems: Secondary | ICD-10-CM

## 2014-09-10 DIAGNOSIS — I498 Other specified cardiac arrhythmias: Secondary | ICD-10-CM | POA: Insufficient documentation

## 2014-09-10 DIAGNOSIS — E1159 Type 2 diabetes mellitus with other circulatory complications: Secondary | ICD-10-CM

## 2014-09-10 DIAGNOSIS — J4489 Other specified chronic obstructive pulmonary disease: Secondary | ICD-10-CM | POA: Diagnosis present

## 2014-09-10 DIAGNOSIS — D696 Thrombocytopenia, unspecified: Secondary | ICD-10-CM

## 2014-09-10 DIAGNOSIS — Z8673 Personal history of transient ischemic attack (TIA), and cerebral infarction without residual deficits: Secondary | ICD-10-CM | POA: Diagnosis not present

## 2014-09-10 DIAGNOSIS — Z8589 Personal history of malignant neoplasm of other organs and systems: Secondary | ICD-10-CM | POA: Insufficient documentation

## 2014-09-10 DIAGNOSIS — J438 Other emphysema: Secondary | ICD-10-CM

## 2014-09-10 DIAGNOSIS — R001 Bradycardia, unspecified: Secondary | ICD-10-CM | POA: Diagnosis present

## 2014-09-10 DIAGNOSIS — R0600 Dyspnea, unspecified: Secondary | ICD-10-CM

## 2014-09-10 DIAGNOSIS — R1310 Dysphagia, unspecified: Secondary | ICD-10-CM | POA: Diagnosis not present

## 2014-09-10 DIAGNOSIS — E1129 Type 2 diabetes mellitus with other diabetic kidney complication: Secondary | ICD-10-CM | POA: Insufficient documentation

## 2014-09-10 DIAGNOSIS — I484 Atypical atrial flutter: Secondary | ICD-10-CM

## 2014-09-10 DIAGNOSIS — N058 Unspecified nephritic syndrome with other morphologic changes: Secondary | ICD-10-CM | POA: Insufficient documentation

## 2014-09-10 DIAGNOSIS — E785 Hyperlipidemia, unspecified: Secondary | ICD-10-CM | POA: Insufficient documentation

## 2014-09-10 DIAGNOSIS — Z79899 Other long term (current) drug therapy: Secondary | ICD-10-CM | POA: Insufficient documentation

## 2014-09-10 HISTORY — DX: Dysphagia, unspecified: R13.10

## 2014-09-10 HISTORY — DX: Unspecified atrial flutter: I48.92

## 2014-09-10 HISTORY — DX: Unspecified severe protein-calorie malnutrition: E43

## 2014-09-10 HISTORY — DX: Chronic obstructive pulmonary disease, unspecified: J44.9

## 2014-09-10 HISTORY — DX: Dissection of abdominal aorta: I71.02

## 2014-09-10 HISTORY — DX: Thrombocytopenia, unspecified: D69.6

## 2014-09-10 LAB — URINE MICROSCOPIC-ADD ON

## 2014-09-10 LAB — CBC WITH DIFFERENTIAL/PLATELET
Basophils Absolute: 0 10*3/uL (ref 0.0–0.1)
Basophils Relative: 0 % (ref 0–1)
EOS PCT: 10 % — AB (ref 0–5)
Eosinophils Absolute: 0.6 10*3/uL (ref 0.0–0.7)
HEMATOCRIT: 40.4 % (ref 39.0–52.0)
Hemoglobin: 14 g/dL (ref 13.0–17.0)
LYMPHS ABS: 1.4 10*3/uL (ref 0.7–4.0)
Lymphocytes Relative: 24 % (ref 12–46)
MCH: 32.5 pg (ref 26.0–34.0)
MCHC: 34.7 g/dL (ref 30.0–36.0)
MCV: 93.7 fL (ref 78.0–100.0)
MONOS PCT: 13 % — AB (ref 3–12)
Monocytes Absolute: 0.8 10*3/uL (ref 0.1–1.0)
NEUTROS ABS: 3 10*3/uL (ref 1.7–7.7)
Neutrophils Relative %: 53 % (ref 43–77)
Platelets: 84 10*3/uL — ABNORMAL LOW (ref 150–400)
RBC: 4.31 MIL/uL (ref 4.22–5.81)
RDW: 13.1 % (ref 11.5–15.5)
WBC: 5.8 10*3/uL (ref 4.0–10.5)

## 2014-09-10 LAB — URINALYSIS, ROUTINE W REFLEX MICROSCOPIC
BILIRUBIN URINE: NEGATIVE
GLUCOSE, UA: NEGATIVE mg/dL
HGB URINE DIPSTICK: NEGATIVE
Ketones, ur: NEGATIVE mg/dL
Nitrite: NEGATIVE
PH: 7.5 (ref 5.0–8.0)
Protein, ur: NEGATIVE mg/dL
Specific Gravity, Urine: 1.022 (ref 1.005–1.030)
Urobilinogen, UA: 2 mg/dL — ABNORMAL HIGH (ref 0.0–1.0)

## 2014-09-10 LAB — BASIC METABOLIC PANEL
Anion gap: 10 (ref 5–15)
BUN: 26 mg/dL — AB (ref 6–23)
CO2: 30 meq/L (ref 19–32)
CREATININE: 0.66 mg/dL (ref 0.50–1.35)
Calcium: 9.1 mg/dL (ref 8.4–10.5)
Chloride: 99 mEq/L (ref 96–112)
GFR calc Af Amer: >90 mL/min (ref >90)
GFR calc non Af Amer: 89 mL/min — ABNORMAL LOW (ref >90)
GLUCOSE: 127 mg/dL — AB (ref 70–99)
Potassium: 4 mEq/L (ref 3.7–5.3)
SODIUM: 139 meq/L (ref 137–147)

## 2014-09-10 LAB — HEPATIC FUNCTION PANEL
ALBUMIN: 3.5 g/dL (ref 3.5–5.2)
ALK PHOS: 103 U/L (ref 39–117)
ALT: 18 U/L (ref 0–53)
AST: 32 U/L (ref 0–37)
Bilirubin, Direct: 0.2 mg/dL (ref 0.0–0.3)
Indirect Bilirubin: 0.8 mg/dL (ref 0.3–0.9)
TOTAL PROTEIN: 7.6 g/dL (ref 6.0–8.3)
Total Bilirubin: 1 mg/dL (ref 0.3–1.2)

## 2014-09-10 LAB — PRO B NATRIURETIC PEPTIDE: Pro B Natriuretic peptide (BNP): 1294 pg/mL — ABNORMAL HIGH (ref 0–450)

## 2014-09-10 LAB — GLUCOSE, CAPILLARY: Glucose-Capillary: 93 mg/dL (ref 70–99)

## 2014-09-10 LAB — TROPONIN I: Troponin I: <0.3 ng/mL (ref <0.30)

## 2014-09-10 MED ORDER — ACETAMINOPHEN 650 MG RE SUPP: 650.0000 mg | Freq: Four times a day (QID) | RECTAL | Status: DC | PRN

## 2014-09-10 MED ORDER — DOCUSATE SODIUM 100 MG PO CAPS
100.0000 mg | ORAL_CAPSULE | Freq: Two times a day (BID) | ORAL | Status: DC
Start: 2014-09-10 — End: 2014-09-11
  Administered 2014-09-11: 100 mg via ORAL
  Filled 2014-09-10 (×3): qty 1

## 2014-09-10 MED ORDER — FREE WATER
200.0000 mL | Freq: Three times a day (TID) | Status: DC
  Administered 2014-09-10 – 2014-09-12 (×5): 200 mL

## 2014-09-10 MED ORDER — OSMOLITE 1.5 CAL PO LIQD
1000.0000 mL | ORAL | Status: DC
  Administered 2014-09-10 – 2014-09-11 (×2): 1000 mL
  Filled 2014-09-10 (×3): qty 1000

## 2014-09-10 MED ORDER — CYPROHEPTADINE HCL 4 MG PO TABS
4.0000 mg | ORAL_TABLET | Freq: Three times a day (TID) | ORAL | Status: DC | PRN
  Filled 2014-09-10: qty 1

## 2014-09-10 MED ORDER — ENOXAPARIN SODIUM 40 MG/0.4ML ~~LOC~~ SOLN
40.0000 mg | SUBCUTANEOUS | Status: DC
Start: 2014-09-10 — End: 2014-09-11
  Administered 2014-09-10: 40 mg via SUBCUTANEOUS
  Filled 2014-09-10 (×2): qty 0.4

## 2014-09-10 MED ORDER — LEVOTHYROXINE SODIUM 88 MCG PO TABS
88.0000 ug | ORAL_TABLET | Freq: Every day | ORAL | Status: DC
  Administered 2014-09-11 – 2014-09-12 (×2): 88 ug
  Filled 2014-09-10 (×3): qty 1

## 2014-09-10 MED ORDER — MOMETASONE FURO-FORMOTEROL FUM 100-5 MCG/ACT IN AERO
2.0000 | INHALATION_SPRAY | Freq: Two times a day (BID) | RESPIRATORY_TRACT | Status: DC
  Administered 2014-09-11 – 2014-09-12 (×3): 2 via RESPIRATORY_TRACT
  Filled 2014-09-10: qty 8.8

## 2014-09-10 MED ORDER — TIOTROPIUM BROMIDE MONOHYDRATE 18 MCG IN CAPS
18.0000 ug | ORAL_CAPSULE | Freq: Every day | RESPIRATORY_TRACT | Status: DC
  Administered 2014-09-11: 18 ug via RESPIRATORY_TRACT
  Filled 2014-09-10: qty 5

## 2014-09-10 MED ORDER — OXYCODONE HCL 5 MG PO TABS
15.0000 mg | ORAL_TABLET | ORAL | Status: DC | PRN
  Administered 2014-09-12: 15 mg
  Filled 2014-09-10: qty 3

## 2014-09-10 MED ORDER — INSULIN ASPART 100 UNIT/ML ~~LOC~~ SOLN
0.0000 [IU] | Freq: Three times a day (TID) | SUBCUTANEOUS | Status: DC
  Administered 2014-09-11: 1 [IU] via SUBCUTANEOUS
  Administered 2014-09-11: 2 [IU] via SUBCUTANEOUS
  Administered 2014-09-12: 1 [IU] via SUBCUTANEOUS

## 2014-09-10 MED ORDER — ENSURE COMPLETE PO LIQD
237.0000 mL | Freq: Two times a day (BID) | ORAL | Status: DC
  Administered 2014-09-11 – 2014-09-12 (×3): 237 mL

## 2014-09-10 MED ORDER — ACETAMINOPHEN 325 MG PO TABS: 650.0000 mg | ORAL_TABLET | Freq: Four times a day (QID) | ORAL | Status: DC | PRN

## 2014-09-10 MED ORDER — ONDANSETRON HCL 4 MG/2ML IJ SOLN
4.0000 mg | Freq: Four times a day (QID) | INTRAMUSCULAR | Status: DC | PRN
Start: 2014-09-10 — End: 2014-09-12

## 2014-09-10 MED ORDER — POLYETHYLENE GLYCOL 3350 17 G PO PACK
17.0000 g | PACK | Freq: Every day | ORAL | Status: DC | PRN
  Filled 2014-09-10: qty 1

## 2014-09-10 MED ORDER — ONDANSETRON HCL 4 MG PO TABS: 4.0000 mg | ORAL_TABLET | Freq: Four times a day (QID) | ORAL | Status: DC | PRN

## 2014-09-10 MED ORDER — MIRTAZAPINE 30 MG PO TABS
30.0000 mg | ORAL_TABLET | Freq: Every day | ORAL | Status: DC
  Administered 2014-09-10: 30 mg
  Filled 2014-09-10 (×3): qty 1

## 2014-09-10 MED ORDER — SODIUM CHLORIDE 0.9 % IJ SOLN
3.0000 mL | Freq: Two times a day (BID) | INTRAMUSCULAR | Status: DC
  Administered 2014-09-10 – 2014-09-11 (×3): 3 mL via INTRAVENOUS

## 2014-09-10 MED ORDER — ASPIRIN 81 MG PO CHEW
81.0000 mg | CHEWABLE_TABLET | Freq: Every day | ORAL | Status: DC
  Administered 2014-09-10 – 2014-09-12 (×3): 81 mg
  Filled 2014-09-10 (×3): qty 1

## 2014-09-10 MED ORDER — ALBUTEROL SULFATE (2.5 MG/3ML) 0.083% IN NEBU: 2.5000 mg | INHALATION_SOLUTION | Freq: Four times a day (QID) | RESPIRATORY_TRACT | Status: DC | PRN

## 2014-09-10 MED ORDER — ALUM & MAG HYDROXIDE-SIMETH 200-200-20 MG/5ML PO SUSP: 30.0000 mL | Freq: Four times a day (QID) | ORAL | Status: DC | PRN

## 2014-09-10 NOTE — ED Notes (Signed)
Attempted to give report. Nurse states she will call back.

## 2014-09-10 NOTE — H&P (Signed)
[facility] History and Physical  [name]:[id number] DOB: October 23, 1931 DOA: 09/10/2014  Referring physician:  Serita Grit  PCP:  Conni Slipper, NP   Chief Complaint:  DOE  HPI:  The patient is a 80 y.o. year-old male with history of DM2 with nephropathy, HTN, HLD, PVD, CVA, COPD Gold C, intermittent thrombocytopenia, HCC s/p right lobe resection in 2012 at [facility], SVT s/p ablation, paroxysmal atrial fibrillation and flutter with possible tachy-brady syndrome previously deemed not a good candidate for anticoagulation possibly due to intermittent thrombocytopenia and poor functional status, severe pharyngeal dysphagia with hx of aspiration pneumonia s/p PEG placement who eats despite known aspiration risk.  He presents from home because of shortness of breath.  He states that for the last year, he has had DOE.  He can walk possibly up to half a block before he becomes SOB and needs to rest.  For the last 4 months, he has had intermittent swelling of the ankles, but he denies orthopnea, PND, and chest tightness or pain.  He states he uses his inhalers to help his breathing.  Today, his SOB was his usual SOB, but he decided he wanted to find out what was causing it.  He states he had hoped it would get better so he did not discuss this with his primary care doctor or his cardiologist, Dr. Gwenlyn Found.    In the ER, ECG demonstrated atrial fibrillation vs. Possible atrial flutter and his rate varied from the 30s to the 60s with pauses up to 2.5 seconds, not on any rate control medications.  BP stable.  Labs were notable for plt 84.  Troponin negative.  Pro-BNP was mildly elevated at 1294 but CXR was without acute changes.  He was seen by Dr. Angelena Form who recommends observation overnight, ECHO, and possible pacemaker if he continues to have low ventricular rates.    Review of Systems:  General:  Denies fevers, chills, weight loss or gain HEENT:  Denies changes to hearing and vision, rhinorrhea,  sinus congestion, sore throat CV:  Denies chest pain and palpitations PULM:  Positive SOB.  Denies wheezing.  Chronic nonproductive cough.   GI:  Denies nausea, vomiting, constipation, diarrhea.   GU:  Denies dysuria, frequency, urgency ENDO:  Denies polyuria, polydipsia.   HEME:  Denies hematemesis, blood in stools, melena, abnormal bruising or bleeding.  LYMPH:  Denies lymphadenopathy.   MSK:  Denies arthralgias, myalgias.   DERM:  Denies skin rash or ulcer.   NEURO:  Denies focal numbness, weakness, slurred speech, confusion, facial droop.  PSYCH:  Denies anxiety and depression.    Past Medical History  Diagnosis Date  . DM2 (diabetes mellitus, type 2)   . Hyperlipidemia   . Hypertension   . Cerebral vascular disease   . [place] (hepatocellular carcinoma)     s/p right lobe resection 2012  . Blood dyscrasia     THROMBCYTOPATHIA  . Peripheral vascular disease     LICA stent 8315  . Pneumonia 11/03/2012    aspiration pneumonias  . COPD (chronic obstructive pulmonary disease)     Gold C  . Thrombocytopenia   . Severe malnutrition   . Dysphagia     s/p PEG placement  . Paroxysmal atrial flutter     and fibrillation with known bradycardia, Chads2vasc of 4, not on A/C due to thrombocytopenia and functional status  . Dissecting aortic aneurysm, abdominal 11/13/2011    Overview:  Severe aortoiliac stenosis with possible a chronic dissection versus  ulcerated plaque/thrombus  extending above and below the aortic  bifurcation; without aneurysmal dilation  as per CT abdomen noted 09/25/2011    Past Surgical History  Procedure Laterality Date  . Carotid stent  2002  . Liver canc    . Posterior cervical fusion/foraminotomy  09/25/2012    Procedure: POSTERIOR CERVICAL FUSION/FORAMINOTOMY LEVEL 2;  Surgeon: Eustace Moore, MD;  Location: [place] NEURO ORS;  Service: Neurosurgery;  Laterality: N/A;  Posterior Cervical three-four laminectomy, posterior cervical three-four, four-five fusion  .  Esophagogastroduodenoscopy  10/01/2012    Procedure: ESOPHAGOGASTRODUODENOSCOPY (EGD);  Surgeon: Gwenyth Ober, MD;  Location: [place];  Service: General;  Laterality: N/A;  wyatt/leone  . Peg placement  10/01/2012    Procedure: PERCUTANEOUS ENDOSCOPIC GASTROSTOMY (PEG) PLACEMENT;  Surgeon: Gwenyth Ober, MD;  Location: [place];  Service: General;  Laterality: N/A;  . Esophagogastroduodenoscopy  10/31/2012    Procedure: ESOPHAGOGASTRODUODENOSCOPY (EGD);  Surgeon: Beryle Beams, MD;  Location: [facility] ENDOSCOPY;  Service: Endoscopy;  Laterality: N/A;   Social History:  reports that he quit smoking about 2 years ago. His smoking use included Cigarettes. He has a 50 pack-year smoking history. He has never used smokeless tobacco. He reports that he drinks alcohol. He reports that he does not use illicit drugs.   No Known Allergies  Family History  Problem Relation Age of Onset  . CAD Neg Hx   . Cancer Father     throat, died young  . Liver disease Brother      Prior to Admission medications   Medication Sig Start Date End Date Taking? Authorizing Provider  albuterol (PROVENTIL) (2.5 MG/3ML) 0.083% nebulizer solution Take 2.5 mg by nebulization every 6 (six) hours.   Yes Historical Provider, MD  aspirin EC 81 MG tablet Take 81 mg by mouth daily.   Yes Historical Provider, MD  cyproheptadine (PERIACTIN) 4 MG tablet Take 4 mg by mouth 3 (three) times daily as needed for allergies (muscle spams).   Yes Historical Provider, MD  feeding supplement, ENSURE COMPLETE, (ENSURE COMPLETE) LIQD Take 237 mLs by mouth 2 (two) times daily between meals. 10/19/13  Yes Nita Sells, MD  ipratropium (ATROVENT) 0.02 % nebulizer solution Take 2.5 mLs (0.5 mg total) by nebulization every 6 (six) hours. 12/05/12  Yes Elsie Stain, MD  levothyroxine (SYNTHROID, LEVOTHROID) 88 MCG tablet Take 88 mcg by mouth daily before breakfast.   Yes Historical Provider, MD  linagliptin (TRADJENTA) 5 MG TABS tablet  Take 5 mg by mouth daily.    Yes Historical Provider, MD  mirtazapine (REMERON) 30 MG tablet Take 30 mg by mouth at bedtime.   Yes Historical Provider, MD  Nutritional Supplements (FEEDING SUPPLEMENT, OSMOLITE 1.5 CAL,) LIQD Place 1,000 mLs into feeding tube continuous. 10/19/13  Yes Nita Sells, MD  oxyCODONE (OXY IR/ROXICODONE) 5 MG immediate release tablet Take 15 mg by mouth every 4 (four) hours as needed for severe pain (pain in neck & shoulder).    Yes Historical Provider, MD  tiotropium (SPIRIVA) 18 MCG inhalation capsule Place 18 mcg into inhaler and inhale 2 (two) times daily.   Yes Historical Provider, MD  Water For Irrigation, Sterile (FREE WATER) SOLN Place 200 mLs into feeding tube every 8 (eight) hours. 10/19/13  Yes Nita Sells, MD   Physical Exam: Filed Vitals:   09/10/14 1532 09/10/14 1600 09/10/14 1630 09/10/14 1700  BP: 169/58 145/67 155/75 175/72  Pulse: 45  37 41  Temp:      TempSrc:  Resp: 18 18 14 14   Height:      Weight:      SpO2: 100%  98% 97%     General:  Cachectic WM, NAD  Eyes:  PERRL, anicteric, non-injected.  ENT:  Nares clear.  OP clear, edentulous, non-erythematous without plaques or exudates.  MMM.  Neck:  Supple without TM or JVD.    Lymph:  No cervical, supraclavicular, or submandibular LAD.  Cardiovascular:  IRRR and bradycardic, normal S1, S2, without m/r/g.  2+ pulses, warm extremities  Respiratory:  Diminished bilateral BS, no rales, wheezes, or rhonchi, no increased WOB.  Abdomen:  NABS.  Soft, ND/NT.  PEG tube c/d/i  Skin:  No rashes or focal lesions.  Musculoskeletal:  Normal bulk and tone.  Trace pitting LE edema behind ankles  Psychiatric:  A & O x 4.  Appropriate affect.  Neurologic:  CN 3-12 intact.  5/5 strength.  Sensation intact.  Labs on Admission:  Basic Metabolic Panel:  Recent Labs Lab 09/10/14 1344  NA 139  K 4.0  CL 99  CO2 30  GLUCOSE 127*  BUN 26*  CREATININE 0.66  CALCIUM 9.1    Liver Function Tests: No results found for this basename: AST, ALT, ALKPHOS, BILITOT, PROT, ALBUMIN,  in the last 168 hours No results found for this basename: LIPASE, AMYLASE,  in the last 168 hours No results found for this basename: AMMONIA,  in the last 168 hours CBC:  Recent Labs Lab 09/10/14 1344  WBC 5.8  NEUTROABS 3.0  HGB 14.0  HCT 40.4  MCV 93.7  PLT 84*   Cardiac Enzymes:  Recent Labs Lab 09/10/14 1344  TROPONINI <0.30    BNP (last 3 results)  Recent Labs  09/10/14 1344  PROBNP 1294.0*   CBG: No results found for this basename: GLUCAP,  in the last 168 hours  Radiological Exams on Admission: Dg Chest 2 View  09/10/2014   CLINICAL DATA:  Cough and shortness of breath  EXAM: CHEST  2 VIEW  COMPARISON:  10/16/2013  FINDINGS: Cardiac shadow is at the upper limits of normal in size but stable. Scarring is noted in the left lung base. No focal infiltrate or sizable effusion is seen. Degenerative change of thoracic spine is noted.  IMPRESSION: Chronic changes without acute abnormality.   Electronically Signed   By: Inez Catalina M.D.   On: 09/10/2014 13:40    EKG: Independently reviewed. A-fib vs. A-flutter with rate 54 bpm, no acute ischemia   Assessment/Plan Principal Problem:   Atrial flutter, recurrent, history of RFA in 2006, at times brady. Active Problems:   DM (diabetes mellitus)   Hypothyroid   Dysphagia, J tube placed   Copd Golds C   Protein-calorie malnutrition, severe   Dyspnea   Tobacco abuse, in remission   Bradycardia   Thrombocytopenia  ---  DOE may be due to malnutrition and COPD and chronic aspiration, but bradycardia may also be contributing.  Lower extremity edema concerning for possible heart failure.   -  Treat COPD -  Minimize aspiration risk -  See below for bradycardia management -  ECHO  -  Check albumin -  UA and Urine P:C  Paroxysmal atrial fibrillation/flutter with > 2 sec pauses and rates to the 30s.  Chads2vasc of  at least 7 (awaiting ECHO).  Not on any AV nodal blocking medications -  Monitor on telemetry -  ECHO -  No need to cycle troponins since no change in status in last several  months -  Appreciate cardiology assistance - possible EP consult if persistent bradycardia or pauses on telemetry -  I would reconsider anticoagulation in this man since his platelet count is usually wnl or only mildly low given his high Chads2vasc score since he is still full code and has not had any serious problems with bleeding. -  Continue asa 81mg  daily in the mean time  COPD stage C on unusual regimen -  D/c ipratropium neb -  Change spiriva to once daily -  Continue albuterol nebs prn -  Start dulera   Dysphagia with severe aspiration and microaspiration with history of pneumonia -  Repeat speech evaluation (last was over a year ago) -  Continue tube feeds - Nutrition consult -  Dysphagia 1 diet with honey thick liquids  Thrombocytopenia, unclear etiology.  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   -  Check anti-platelet ab -  Trend platelets  Severe protein calorie malnutrition/cachexia -  Nutrition consultation -  Continue tube feeds  DM type 2 with nephropathy -  A1c -  Hold linagliptin -  Start SSI  [facility] s/p right lobe resection 2012   PVD/carotid artery disease s/p LICA stent 0131, stable, continue ASA 81mg  daily  Hypothyroidism -  check TSH -  Continue synthroid  Possible depression, stable, continue remeron (although this may also have been prescribed as appetite stimulant)  Diet:  Dysphagia 1 with honey Access:  PIV  IVF:  off Proph:  lovenox >> please d/c this if platelets drop < 50K  Code Status: full Family Communication: patient alone Disposition Plan: Admit to telemetry  Time spent: 60 min Janece Canterbury [facility] Pager [phone]  If 7PM-7AM, please contact night-coverage [email or web address] Password [facility] 09/10/2014, 5:54  PM

## 2014-09-10 NOTE — ED Notes (Signed)
Pt reports SOB x1 week and SOB increased this morning.  Pt reports dizziness over the past week but none is present currently. Pt denies blurred vision.

## 2014-09-10 NOTE — ED Notes (Signed)
Placed patients belongings in a bag next to bed

## 2014-09-10 NOTE — Consult Note (Addendum)
Patient ID: [name] MRN: [id number] DOB/AGE: 1931-08-22 80 y.o.  Admit date: 09/10/2014 Primary Cardiologist:  Gwenlyn Found Reason for Consultation: Dyspnea, bradycardia  HPI: 80 yo male with history of atrial fib/flutter (not felt to be a good candidate for anti-coagulation), DM, HLD, HTN, Carotid artery disease, HCC-in remission, PAD, SVT s/p ablation who presented to the [facility] ED today with c/o dyspnea. He has had dyspnea for 4-6 months. No big changes today. He was found to have atrial fibrillation with slow ventricular response, HR4 0-50s. Chest x-ray without acute changes. Pt without any chest pain or LE edema. No recent echo to review. He states that a woman lives with him, just a friend. He is fairly active in the house but doesn't do much outside. He smoked 1ppd for 50 years, quitting 2013. He has occasional palpitations when he coughs but no associated dizziness, near syncope or syncope. Old notes document atrial fib with bradycardia, not a good candidate for anti-coagulation.   Tele: Atrial fib, rate 40-55 bpm.   Past Medical History  Diagnosis Date  . DM2 (diabetes mellitus, type 2)   . Hyperlipidemia   . Hypertension   . Cerebral vascular disease   . [place] (hepatocellular carcinoma)   . Blood dyscrasia     THROMBCYTOPATHIA  . Peripheral vascular disease   . Pneumonia 11/03/2012    Family History  Problem Relation Age of Onset  . CAD Neg Hx     History   Social History  . Marital Status: Widowed    Spouse Name: N/A    Number of Children: 2  . Years of Education: N/A   Occupational History  .     Social History Main Topics  . Smoking status: Former Smoker -- 1.00 packs/day for 50 years    Types: Cigarettes    Quit date: 01/01/2012  . Smokeless tobacco: Never Used  . Alcohol Use: Yes     Comment: 1 to 2 beers per week  . Drug Use: No  . Sexual Activity: Not on file   Other Topics Concern  . Not on file   Social History Narrative  . No narrative on file     Past Surgical History  Procedure Laterality Date  . Carotid stent  2002  . Liver canc    . Posterior cervical fusion/foraminotomy  09/25/2012    Procedure: POSTERIOR CERVICAL FUSION/FORAMINOTOMY LEVEL 2;  Surgeon: Eustace Moore, MD;  Location: [place] NEURO ORS;  Service: Neurosurgery;  Laterality: N/A;  Posterior Cervical three-four laminectomy, posterior cervical three-four, four-five fusion  . Esophagogastroduodenoscopy  10/01/2012    Procedure: ESOPHAGOGASTRODUODENOSCOPY (EGD);  Surgeon: Gwenyth Ober, MD;  Location: [place];  Service: General;  Laterality: N/A;  wyatt/leone  . Peg placement  10/01/2012    Procedure: PERCUTANEOUS ENDOSCOPIC GASTROSTOMY (PEG) PLACEMENT;  Surgeon: Gwenyth Ober, MD;  Location: [place];  Service: General;  Laterality: N/A;  . Esophagogastroduodenoscopy  10/31/2012    Procedure: ESOPHAGOGASTRODUODENOSCOPY (EGD);  Surgeon: Beryle Beams, MD;  Location: [facility] ENDOSCOPY;  Service: Endoscopy;  Laterality: N/A;    No Known Allergies  Home Meds:  No current facility-administered medications on file prior to encounter.   Current Outpatient Prescriptions on File Prior to Encounter  Medication Sig Dispense Refill  . aspirin EC 81 MG tablet Take 81 mg by mouth daily.      . feeding supplement, ENSURE COMPLETE, (ENSURE COMPLETE) LIQD Take 237 mLs by mouth 2 (two) times daily between meals.  60 Bottle  0  . ipratropium (ATROVENT) 0.02 % nebulizer solution Take 2.5 mLs (0.5 mg total) by nebulization every 6 (six) hours.  260 mL  6  . levothyroxine (SYNTHROID, LEVOTHROID) 88 MCG tablet Take 88 mcg by mouth daily before breakfast.      . linagliptin (TRADJENTA) 5 MG TABS tablet Take 5 mg by mouth daily.       . mirtazapine (REMERON) 30 MG tablet Take 30 mg by mouth at bedtime.      . Nutritional Supplements (FEEDING SUPPLEMENT, OSMOLITE 1.5 CAL,) LIQD Place 1,000 mLs into feeding tube continuous.  1000 mL  0  . oxyCODONE (OXY IR/ROXICODONE) 5 MG immediate release  tablet Take 15 mg by mouth every 4 (four) hours as needed for severe pain (pain in neck & shoulder).       . Water For Irrigation, Sterile (FREE WATER) SOLN Place 200 mLs into feeding tube every 8 (eight) hours.        Review of systems complete and found to be negative unless listed above   Physical Exam: Blood pressure 169/58, pulse 45, temperature 98 F (36.7 C), temperature source Oral, resp. rate 18, height 5\' 9"  (1.753 m), weight 123 lb (55.792 kg), SpO2 100.00%.    General: Thin cachectic elderly male. Pleasant. Alert and oriented x 3. NAD  HEENT: OP clear, mucus membranes moist  SKIN: warm, dry. No rashes.  Neuro: No focal deficits  Musculoskeletal: Muscle strength 5/5 all ext  Psychiatric: Mood and affect normal  Neck: No JVD, no thyromegaly, no lymphadenopathy.  Lungs:Clear bilaterally but poo air movement. No crackles  Cardiovascular: Irreg irreg brady. No murmurs, gallops or rubs.  Abdomen:Soft. Bowel sounds present. Non-tender.  Extremities: No lower extremity edema. Pulses are 2 + in the bilateral DP/PT.  Labs:   Lab Results  Component Value Date   WBC 5.8 09/10/2014   HGB 14.0 09/10/2014   HCT 40.4 09/10/2014   MCV 93.7 09/10/2014   PLT 84* 09/10/2014     Recent Labs Lab 09/10/14 1344  NA 139  K 4.0  CL 99  CO2 30  BUN 26*  CREATININE 0.66  CALCIUM 9.1  GLUCOSE 127*   Lab Results  Component Value Date   TROPONINI <0.30 09/10/2014    Chest x-ray:  Cardiac shadow is at the upper limits of normal in size but stable.  Scarring is noted in the left lung base. No focal infiltrate or  sizable effusion is seen. Degenerative change of thoracic spine is  noted.  IMPRESSION:  Chronic changes without acute abnormality.  EKG: Atrial fib, LVH, IVCD, poor R wave progression precordial leads.   ASSESSMENT AND PLAN:   1. Atrial fibrillation, parosyxmal: Slow ventricular response. This is documented in past notes. He has not been felt to be a candidate for  anti-coagulation by his primary cardiologist. He is not on AV nodal blocking agents. It is not clear if his dyspnea is related to his bradycardia. He is having 2 second pauses.  -Avoid AV nodal blocking agents  -Monitor on telemetry tonight -Echo to assess LV function -No heparin with history of HIT. Not felt to be a good candidate for long term anti-coagulation.   -We may need to get EP involved if his ventricular rate remains low, consider pacemaker  2. Dyspnea: Unclear etiology. Could be due to bradycardia. Chest x-ray with chronic changes. BNP is slightly elevated but no clinical evidence of volume overload. He was a long time smoker. No prior diagnosis of COPD. Further workup  per primary team.   3. History of SVT: s/p ablation.   4. DM: Per primary team.   5. Tobacco abuse, in remission: He stopped smoking in 2013.    Signed: Aloura Matsuoka 09/10/2014, 4:39 PM

## 2014-09-10 NOTE — ED Notes (Signed)
Pt stas 92-100% on RA while ambulating in hallway

## 2014-09-10 NOTE — ED Provider Notes (Signed)
CSN: [id number]     Arrival date & time 09/10/14  1123 History   First MD Initiated Contact with Patient 09/10/14 1206     Chief Complaint  Patient presents with  . Shortness of Breath     (Consider location/radiation/quality/duration/timing/severity/associated sxs/prior Treatment) Patient is a 80 y.o. male presenting with shortness of breath.  Shortness of Breath Severity:  Moderate Onset quality:  Gradual Duration:  2 weeks Timing:  Constant Progression:  Worsening Chronicity:  New Relieved by:  Rest Worsened by:  Exertion Associated symptoms: no abdominal pain, no chest pain, no cough and no fever     Past Medical History  Diagnosis Date  . DM2 (diabetes mellitus, type 2)   . Hyperlipidemia   . Hypertension   . Cerebral vascular disease   . [place] (hepatocellular carcinoma)   . Blood dyscrasia     THROMBCYTOPATHIA  . Peripheral vascular disease   . Pneumonia 11/03/2012   Past Surgical History  Procedure Laterality Date  . Carotid stent  2002  . Liver canc    . Posterior cervical fusion/foraminotomy  09/25/2012    Procedure: POSTERIOR CERVICAL FUSION/FORAMINOTOMY LEVEL 2;  Surgeon: Eustace Moore, MD;  Location: [place] [facility];  Service: Neurosurgery;  Laterality: N/A;  Posterior Cervical three-four laminectomy, posterior cervical three-four, four-five fusion  . Esophagogastroduodenoscopy  10/01/2012    Procedure: ESOPHAGOGASTRODUODENOSCOPY (EGD);  Surgeon: Gwenyth Ober, MD;  Location: [place];  Service: General;  Laterality: N/A;  wyatt/leone  . Peg placement  10/01/2012    Procedure: PERCUTANEOUS ENDOSCOPIC GASTROSTOMY (PEG) PLACEMENT;  Surgeon: Gwenyth Ober, MD;  Location: [place];  Service: General;  Laterality: N/A;  . Esophagogastroduodenoscopy  10/31/2012    Procedure: ESOPHAGOGASTRODUODENOSCOPY (EGD);  Surgeon: Beryle Beams, MD;  Location: [facility];  Service: Endoscopy;  Laterality: N/A;   History reviewed. No pertinent family history. History   Substance Use Topics  . Smoking status: Former Smoker -- 1.00 packs/day for 50 years    Types: Cigarettes    Quit date: 01/01/2012  . Smokeless tobacco: Never Used  . Alcohol Use: No     Comment: former    Review of Systems  Constitutional: Negative for fever.  Respiratory: Positive for shortness of breath. Negative for cough.   Cardiovascular: Negative for chest pain.  Gastrointestinal: Negative for abdominal pain.  All other systems reviewed and are negative.     Allergies  Review of patient's allergies indicates no known allergies.  Home Medications   Prior to Admission medications   Medication Sig Start Date End Date Taking? Authorizing Provider  albuterol (PROVENTIL) (2.5 MG/3ML) 0.083% nebulizer solution USE 1 VIAL EVERY 6 HOURS    Elsie Stain, MD  aspirin EC 81 MG tablet Take 81 mg by mouth daily.    Historical Provider, MD  cephALEXin (KEFLEX) 500 MG capsule Take 1 capsule (500 mg total) by mouth 4 (four) times daily. 06/11/14   Dorie Rank, MD  clindamycin (CLEOCIN) 75 MG/5ML solution Place 20 mLs (300 mg total) into feeding tube every 6 (six) hours. 10/19/13   Nita Sells, MD  cyproheptadine (PERIACTIN) 4 MG tablet Take 4 mg by mouth every evening.    Historical Provider, MD  feeding supplement, ENSURE COMPLETE, (ENSURE COMPLETE) LIQD Take 237 mLs by mouth 2 (two) times daily between meals. 10/19/13   Nita Sells, MD  Fluticasone-Salmeterol (ADVAIR) 250-50 MCG/DOSE AEPB Inhale 1 puff into the lungs every 12 (twelve) hours.    Historical Provider, MD  ipratropium (ATROVENT) 0.02 %  nebulizer solution Take 2.5 mLs (0.5 mg total) by nebulization every 6 (six) hours. 12/05/12   Elsie Stain, MD  levothyroxine (SYNTHROID, LEVOTHROID) 88 MCG tablet Take 88 mcg by mouth daily before breakfast.    Historical Provider, MD  linagliptin (TRADJENTA) 5 MG TABS tablet Take 5 mg by mouth daily.     Historical Provider, MD  mirtazapine (REMERON) 30 MG tablet Take  30 mg by mouth at bedtime.    Historical Provider, MD  Nutritional Supplements (FEEDING SUPPLEMENT, OSMOLITE 1.5 CAL,) LIQD Place 1,000 mLs into feeding tube continuous. 10/19/13   Nita Sells, MD  oxyCODONE (OXY IR/ROXICODONE) 5 MG immediate release tablet Take 10 mg by mouth every 4 (four) hours as needed for pain.    Historical Provider, MD  oxyCODONE-acetaminophen (PERCOCET/ROXICET) 5-325 MG per tablet Take 1 tablet by mouth every 4 (four) hours as needed for pain.    Historical Provider, MD  Water For Irrigation, Sterile (FREE WATER) SOLN Place 200 mLs into feeding tube every 8 (eight) hours. 10/19/13   Nita Sells, MD   BP 125/70  Pulse 60  Temp(Src) 97.6 F (36.4 C) (Oral)  Resp 22  Ht 5\' 9"  (1.753 m)  Wt 123 lb (55.792 kg)  BMI 18.16 kg/m2  SpO2 100% Physical Exam  Nursing note and vitals reviewed. Constitutional: He is oriented to person, place, and time. He appears well-developed and well-nourished. No distress.  HENT:  Head: Normocephalic and atraumatic.  Mouth/Throat: Oropharynx is clear and moist.  Eyes: Conjunctivae are normal. Pupils are equal, round, and reactive to light. No scleral icterus.  Neck: Neck supple.  Cardiovascular: Normal rate, regular rhythm, normal heart sounds and intact distal pulses.   No murmur heard. Pulmonary/Chest: Effort normal and breath sounds normal. No stridor. No respiratory distress. He has no wheezes. He has no rales.  Abdominal: Soft. He exhibits no distension. There is no tenderness. There is no rigidity, no rebound and no guarding.  g tube in place  Musculoskeletal: Normal range of motion. He exhibits no edema.  Neurological: He is alert and oriented to person, place, and time.  Skin: Skin is warm and dry. No rash noted.  Psychiatric: He has a normal mood and affect. His behavior is normal.    ED Course  Procedures (including critical care time) Labs Review Labs Reviewed  CBC WITH DIFFERENTIAL - Abnormal; Notable  for the following:    Platelets 84 (*)    Monocytes Relative 13 (*)    Eosinophils Relative 10 (*)    All other components within normal limits  BASIC METABOLIC PANEL - Abnormal; Notable for the following:    Glucose, Bld 127 (*)    BUN 26 (*)    GFR calc non Af Amer 89 (*)    All other components within normal limits  PRO B NATRIURETIC PEPTIDE - Abnormal; Notable for the following:    Pro B Natriuretic peptide (BNP) 1294.0 (*)    All other components within normal limits  TROPONIN I    Imaging Review Dg Chest 2 View  09/10/2014   CLINICAL DATA:  Cough and shortness of breath  EXAM: CHEST  2 VIEW  COMPARISON:  10/16/2013  FINDINGS: Cardiac shadow is at the upper limits of normal in size but stable. Scarring is noted in the left lung base. No focal infiltrate or sizable effusion is seen. Degenerative change of thoracic spine is noted.  IMPRESSION: Chronic changes without acute abnormality.   Electronically Signed   By: Inez Catalina  M.D.   On: 09/10/2014 13:40     EKG Interpretation   Date/Time:  Friday September 10 2014 11:39:11 EDT Ventricular Rate:  54 PR Interval:    QRS Duration: 122 QT Interval:  418 QTC Calculation: 396 R Axis:   -59 Text Interpretation:  Atrial fibrillation Nonspecific IVCD with LAD LVH  with secondary repolarization abnormality Anterior infarct, old Baseline  wander in lead(s) II nonspecific ST/T changes, not significantly different  from prior. Confirmed by [facility]  MD, Lytle Michaels ([id number]) on 09/10/2014 1:42:11 PM      MDM   Final diagnoses:  Atrial fibrillation, unspecified  Symptomatic bradycardia    80 yo male presenting with chief complaint of shortness of breath.  He is a difficult historian, but ultimately reports dyspnea on exertion as his primary complaint.  His EKG shows a fib.  Recent cardiology notes do not mention a fib, he is not on anticoagulation, is not on rate control agents, and denies a personal history of a fib.  During his ED course  his HR was low in the 30s to 60s.  I discussed his case with Dr. Angelena Form (Cardiology) who will consult and Dr. Sheran Fava ([facility]) who will admit.      Houston Siren III, MD 09/10/14 [phone]

## 2014-09-11 DIAGNOSIS — E1129 Type 2 diabetes mellitus with other diabetic kidney complication: Secondary | ICD-10-CM

## 2014-09-11 DIAGNOSIS — D696 Thrombocytopenia, unspecified: Secondary | ICD-10-CM

## 2014-09-11 DIAGNOSIS — I517 Cardiomegaly: Secondary | ICD-10-CM

## 2014-09-11 DIAGNOSIS — I4892 Unspecified atrial flutter: Secondary | ICD-10-CM | POA: Diagnosis not present

## 2014-09-11 DIAGNOSIS — J438 Other emphysema: Secondary | ICD-10-CM

## 2014-09-11 DIAGNOSIS — N058 Unspecified nephritic syndrome with other morphologic changes: Secondary | ICD-10-CM

## 2014-09-11 LAB — GLUCOSE, CAPILLARY
GLUCOSE-CAPILLARY: 169 mg/dL — AB (ref 70–99)
GLUCOSE-CAPILLARY: 75 mg/dL (ref 70–99)
Glucose-Capillary: 150 mg/dL — ABNORMAL HIGH (ref 70–99)
Glucose-Capillary: 161 mg/dL — ABNORMAL HIGH (ref 70–99)

## 2014-09-11 LAB — CBC
HCT: 40.1 % (ref 39.0–52.0)
HEMOGLOBIN: 13.7 g/dL (ref 13.0–17.0)
MCH: 32.7 pg (ref 26.0–34.0)
MCHC: 34.2 g/dL (ref 30.0–36.0)
MCV: 95.7 fL (ref 78.0–100.0)
Platelets: 82 10*3/uL — ABNORMAL LOW (ref 150–400)
RBC: 4.19 MIL/uL — AB (ref 4.22–5.81)
RDW: 13.3 % (ref 11.5–15.5)
WBC: 5.2 10*3/uL (ref 4.0–10.5)

## 2014-09-11 LAB — BASIC METABOLIC PANEL
Anion gap: 8 (ref 5–15)
BUN: 20 mg/dL (ref 6–23)
CO2: 31 mEq/L (ref 19–32)
Calcium: 9 mg/dL (ref 8.4–10.5)
Chloride: 103 mEq/L (ref 96–112)
Creatinine, Ser: 0.74 mg/dL (ref 0.50–1.35)
GFR calc Af Amer: >90 mL/min (ref >90)
GFR calc non Af Amer: 85 mL/min — ABNORMAL LOW (ref >90)
Glucose, Bld: 269 mg/dL — ABNORMAL HIGH (ref 70–99)
Potassium: 3.9 mEq/L (ref 3.7–5.3)
SODIUM: 142 meq/L (ref 137–147)

## 2014-09-11 LAB — PROTEIN / CREATININE RATIO, URINE
Creatinine, Urine: 85.22 mg/dL
PROTEIN CREATININE RATIO: 0.15 (ref 0.00–0.15)
Total Protein, Urine: 13 mg/dL

## 2014-09-11 LAB — HEMOGLOBIN A1C
Hgb A1c MFr Bld: 6.7 % — ABNORMAL HIGH (ref <5.7)
Mean Plasma Glucose: 146 mg/dL — ABNORMAL HIGH (ref <117)

## 2014-09-11 LAB — TSH: TSH: 2.96 u[IU]/mL (ref 0.350–4.500)

## 2014-09-11 MED ORDER — DOCUSATE SODIUM 50 MG/5ML PO LIQD
100.0000 mg | Freq: Two times a day (BID) | ORAL | Status: DC
  Administered 2014-09-12 (×2): 100 mg
  Filled 2014-09-11 (×4): qty 10

## 2014-09-11 MED ORDER — RESOURCE THICKENUP CLEAR PO POWD: ORAL | Status: DC | PRN

## 2014-09-11 MED ORDER — RESOURCE THICKENUP CLEAR PO POWD
ORAL | Status: DC | PRN
  Filled 2014-09-11: qty 125

## 2014-09-11 NOTE — Evaluation (Signed)
Clinical/Bedside Swallow Evaluation Patient Details  Name: [name] MRN: [id number] Date of Birth: Sep 23, 1931  Today's Date: 09/11/2014 Time: 1135-1207 SLP Time Calculation (min): 32 min  Past Medical History:  Past Medical History  Diagnosis Date  . DM2 (diabetes mellitus, type 2)   . Hyperlipidemia   . Hypertension   . Cerebral vascular disease   . [place] (hepatocellular carcinoma)     s/p right lobe resection 2012  . Blood dyscrasia     THROMBCYTOPATHIA  . Peripheral vascular disease     LICA stent [id number]  . Pneumonia 11/03/2012    aspiration pneumonias  . COPD (chronic obstructive pulmonary disease)     Gold C  . Thrombocytopenia   . Severe malnutrition   . Dysphagia     s/p PEG placement  . Paroxysmal atrial flutter     and fibrillation with known bradycardia, Chads2vasc of 4, not on A/C due to thrombocytopenia and functional status  . Dissecting aortic aneurysm, abdominal 11/13/2011    Overview:  Severe aortoiliac stenosis with possible a chronic dissection versus  ulcerated plaque/thrombus extending above and below the aortic  bifurcation; without aneurysmal dilation  as per CT abdomen noted 09/25/2011    Past Surgical History:  Past Surgical History  Procedure Laterality Date  . Carotid stent  2002  . Liver canc    . Posterior cervical fusion/foraminotomy  09/25/2012    Procedure: POSTERIOR CERVICAL FUSION/FORAMINOTOMY LEVEL 2;  Surgeon: Eustace Moore, MD;  Location: [place] [facility];  Service: Neurosurgery;  Laterality: N/A;  Posterior Cervical three-four laminectomy, posterior cervical three-four, four-five fusion  . Esophagogastroduodenoscopy  10/01/2012    Procedure: ESOPHAGOGASTRODUODENOSCOPY (EGD);  Surgeon: Gwenyth Ober, MD;  Location: [place];  Service: General;  Laterality: N/A;  wyatt/leone  . Peg placement  10/01/2012    Procedure: PERCUTANEOUS ENDOSCOPIC GASTROSTOMY (PEG) PLACEMENT;  Surgeon: Gwenyth Ober, MD;  Location: [place];  Service: General;   Laterality: N/A;  . Esophagogastroduodenoscopy  10/31/2012    Procedure: ESOPHAGOGASTRODUODENOSCOPY (EGD);  Surgeon: Beryle Beams, MD;  Location: [facility] ENDOSCOPY;  Service: Endoscopy;  Laterality: N/A;   HPI:  80 year old male admitted 09/10/14 due to [place]. PMH significant for MD, CVA, COPD, severe pharyngeal dysphagia with PEG placement (pt continues po intake with known aspiration risks). BSE ordered to re-evaluate swallow, as last completed objective study was in October 2014.   Assessment / Plan / Recommendation Clinical Impression  Oral motor strength and function appear adequate. Pt given only puree consistency at bedside. Pt presents similarly to [facility] October 2014, with throat clearing and coughing after each bolus. Pt reports awareness of MBS recommendation for NPO status due to high aspiration risk, and has continued to take food/liquid po. Pt asked several times if I thought his swallow function had improved since the MBS last year. Based on today's evaluation, it seems not, however objective study will be completed Monday 09/13/14. Pt indicated desire to go home before then.  If this is the case, and pt is DC'd prior to [facility] completion on Monday, recommend scheduling outpatient MBS for pt.  RN aware of recommendations. Will continue current diet until MBS can be compteted.    Aspiration Risk  Moderate    Diet Recommendation Dysphagia 1 (Puree);Honey-thick liquid   Medication Administration: Via alternative means Supervision: Patient able to self feed Compensations: Slow rate;Small sips/bites;Hard cough after swallow;Clear throat after each swallow Postural Changes and/or Swallow Maneuvers: Upright 30-60 min after meal;Seated upright 90 degrees  Other  Recommendations Recommended Consults: MBS Oral Care Recommendations: Oral care BID   Follow Up Recommendations   (TBD)    Frequency and Duration  (pending MBS results)      Pertinent Vitals/Pain VSS, no pain reported    SLP  Swallow Goals  adherence to safe swallow precautions, education re: po with known risks of aspiration   Swallow Study Prior Functional Status   History of significant oropharyngeal dysphagia, with silent aspiration. Pt has PEG tube, but continues to take po with known risks of aspiration.    General Date of Onset: 10/11/13 HPI: 80 year old male admitted 09/10/14 due to [place]. PMH significant for MD, CVA, COPD, severe pharyngeal dysphagia with PEG placement (pt continues po intake with known aspiration risks). BSE ordered to re-evaluate swallow, as last completed objective study was in October 2014. Type of Study: Bedside swallow evaluation Previous Swallow Assessment: MBS October 2014, penetration and aspiration of all consistencies before, during and after the swallow. Diet Prior to this Study: Dysphagia 1 (puree);Honey-thick liquids Temperature Spikes Noted: No Respiratory Status: Room air History of Recent Intubation: No Behavior/Cognition: Alert;Cooperative;Pleasant mood Oral Cavity - Dentition: Edentulous Self-Feeding Abilities: Able to feed self Patient Positioning: Upright in bed Baseline Vocal Quality: Clear Volitional Cough: Strong Volitional Swallow: Able to elicit    Oral/Motor/Sensory Function Overall Oral Motor/Sensory Function: Appears within functional limits for tasks assessed   Ice Chips Ice chips: Not tested   Thin Liquid Thin Liquid: Not tested    Nectar Thick Nectar Thick Liquid: Not tested   Honey Thick Honey Thick Liquid: Not tested   Puree Puree: Impaired Presentation: Self Fed;Spoon Pharyngeal Phase Impairments: Suspected delayed Swallow;Throat Clearing - Immediate;Cough - Immediate   Solid   GO Functional Assessment Tool Used: ASHA NOMS, clinical judgment Functional Limitations: Swallowing Swallow Current Status (N2355): At least 80 percent but less than 100 percent impaired, limited or restricted Swallow Goal Status [phone]): At least 80 percent but less  than 100 percent impaired, limited or restricted  Solid: Not tested       Celia B. Quentin Ore [facility], [place]  Shonna Chock 09/11/2014,12:11 PM

## 2014-09-11 NOTE — Progress Notes (Addendum)
[facility] PROGRESS NOTE  [name] VOZ:[id number] DOB: 10/06/1931 DOA: 09/10/2014 PCP: Conni Slipper, NP  Assessment/Plan: 1. Dyspnea on exertion- likely due to COPD exacerbation. We'll continue with albuterol when necessary, patient also had elevated BNP 1294. 2-D echocardiogram has been ordered and is pending.  2. Bradycardia- patient has bradycardia with heart rate down 40s and 50s, but he's asymptomatic. Patient has been on anticholinergic medications which can exacerbate bradycardia, I'm going to hold tiotropium as well as Periactin. We'll continue to monitor  the rhythm on telemetry. Discussed with Dr. Marlou Porch. 3. Paroxysmal atrial fibrillation- patient having persistent bradycardia, not on anticoagulation as he is not a good candidate as per cardiology 4. Dysphagia with aspiration- space therapy evaluation ordered, on dysphagia 1 diet with thick liquids 5. Severe protein calorie malnutrition- nutrition consulted 6. Diabetes mellitus- continue sliding scale insulin. 7. Thrombocytopenia- platelet count 82, will discontinue Lovenox. And continue to monitor the platelets in the hospital.  Code Status: *50 Family Communication: *No family at bedside Disposition Plan: To be Decided   Consultants:  Cardiology  Procedures:  *None  Antibiotics:  None  HPI/Subjective: 80 year old male with a history of diabetes, hypertension, hyperlipidemia, CVA, COPD who came to the hospital with dyspnea on exertion. Patient has a history of SVT status post ablation, paroxysmal atrial fibrillation and atrial flutter, not a good candidate for anticoagulation. Patient has asymptomatic bradycardia. Last night patient had 3.2 second pause, but has been asymptomatic. This morning he denies any chest pain or shortness of breath. And feels better  Objective: Filed Vitals:   09/11/14 0428  BP: 141/62  Pulse: 49  Temp: 97.4 F (36.3 C)  Resp: 14    Intake/Output Summary (Last 24  hours) at 09/11/14 1338 Last data filed at 09/11/14 1007  Gross per 24 hour  Intake      0 ml  Output    802 ml  Net   -802 ml   Filed Weights   09/10/14 1140 09/10/14 1834  Weight: 55.792 kg (123 lb) 56.4 kg (124 lb 5.4 oz)    Exam:  Physical Exam: Head: Normocephalic, atraumatic.  Neck: supple,No deformities, masses, or tenderness noted. Lungs: Normal respiratory effort. B/L Clear to auscultation, no crackles or wheezes.  Heart: Regular RR. S1 and S2 normal  Abdomen: BS normoactive. Soft, Nondistended, non-tender.  Extremities: No pretibial edema, no erythema   Data Reviewed: Basic Metabolic Panel:  Recent Labs Lab 09/10/14 1344 09/11/14 0410  NA 139 142  K 4.0 3.9  CL 99 103  CO2 30 31  GLUCOSE 127* 269*  BUN 26* 20  CREATININE 0.66 0.74  CALCIUM 9.1 9.0   Liver Function Tests:  Recent Labs Lab 09/10/14 1900  AST 32  ALT 18  ALKPHOS 103  BILITOT 1.0  PROT 7.6  ALBUMIN 3.5   No results found for this basename: LIPASE, AMYLASE,  in the last 168 hours No results found for this basename: AMMONIA,  in the last 168 hours CBC:  Recent Labs Lab 09/10/14 1344 09/11/14 0410  WBC 5.8 5.2  NEUTROABS 3.0  --   HGB 14.0 13.7  HCT 40.4 40.1  MCV 93.7 95.7  PLT 84* 82*   Cardiac Enzymes:  Recent Labs Lab 09/10/14 1344  TROPONINI <0.30   BNP (last 3 results)  Recent Labs  09/10/14 1344  PROBNP 1294.0*   CBG:  Recent Labs Lab 09/10/14 2201 09/11/14 0800 09/11/14 1119  GLUCAP 93 169* 75    No results found for this or any  previous visit (from the past 240 hour(s)).   Studies: Dg Chest 2 View  09/10/2014   CLINICAL DATA:  Cough and shortness of breath  EXAM: CHEST  2 VIEW  COMPARISON:  10/16/2013  FINDINGS: Cardiac shadow is at the upper limits of normal in size but stable. Scarring is noted in the left lung base. No focal infiltrate or sizable effusion is seen. Degenerative change of thoracic spine is noted.  IMPRESSION: Chronic changes  without acute abnormality.   Electronically Signed   By: Inez Catalina M.D.   On: 09/10/2014 13:40    Scheduled Meds: . aspirin  81 mg Per Tube Daily  . docusate sodium  100 mg Oral BID  . enoxaparin (LOVENOX) injection  40 mg Subcutaneous Q24H  . feeding supplement (ENSURE COMPLETE)  237 mL Per Tube BID BM  . feeding supplement (OSMOLITE 1.5 CAL)  1,000 mL Per Tube Q24H  . free water  200 mL Per Tube 3 times per day  . insulin aspart  0-9 Units Subcutaneous TID WC  . levothyroxine  88 mcg Per Tube QAC breakfast  . mometasone-formoterol  2 puff Inhalation BID  . sodium chloride  3 mL Intravenous Q12H   Continuous Infusions:   Principal Problem:   Atrial flutter, recurrent, history of RFA in 2006, at times brady. Active Problems:   DM (diabetes mellitus)   Hypothyroid   Dysphagia, J tube placed   Copd Golds C   Protein-calorie malnutrition, severe   Dyspnea   Tobacco abuse, in remission   Bradycardia   Thrombocytopenia    Time spent: *25 minutes    [facility] Pager [phone]*. If 7PM-7AM, please contact night-coverage at [email or web address], password [email or web address] 09/11/2014, 1:38 PM  LOS: 1 day

## 2014-09-11 NOTE — Progress Notes (Signed)
Echocardiogram 2D Echocardiogram has been performed.  Doyle Askew 09/11/2014, 2:15 PM

## 2014-09-11 NOTE — Progress Notes (Signed)
UR completed 

## 2014-09-11 NOTE — Progress Notes (Signed)
Primary Cardiologist: Gwenlyn Found   Subjective:  Feels better. Lying flat. Appears quite elderly, thin, fragile.   Objective:  Vital Signs in the last 24 hours: Temp:  [97.4 F (36.3 C)-98 F (36.7 C)] 97.4 F (36.3 C) (09/12 0428) Pulse Rate:  [37-60] 49 (09/12 0428) Resp:  [10-22] 14 (09/12 0428) BP: (125-175)/(55-75) 141/62 mmHg (09/12 0428) SpO2:  [96 %-100 %] 99 % (09/12 0428) Weight:  [123 lb (55.792 kg)-124 lb 5.4 oz (56.4 kg)] 124 lb 5.4 oz (56.4 kg) (09/11 1834)  Intake/Output from previous day: 09/11 0701 - 09/12 0700 In: -  Out: 500 [Urine:500]   Physical Exam: General: Thin elderly in no acute distress. Head:  Normocephalic and atraumatic. Lungs: Poor air movement bilaterally. Heart: Irregularly irregular. Bradycardia No murmur, rubs or gallops.  Abdomen: soft, non-tender, positive bowel sounds. Extremities: No clubbing or cyanosis. No edema. Neurologic: Alert and oriented x 3.    Lab Results:  Recent Labs  09/10/14 1344 09/11/14 0410  WBC 5.8 5.2  HGB 14.0 13.7  PLT 84* 82*    Recent Labs  09/10/14 1344 09/11/14 0410  NA 139 142  K 4.0 3.9  CL 99 103  CO2 30 31  GLUCOSE 127* 269*  BUN 26* 20  CREATININE 0.66 0.74    Recent Labs  09/10/14 1344  TROPONINI <0.30   Hepatic Function Panel  Recent Labs  09/10/14 1900  PROT 7.6  ALBUMIN 3.5  AST 32  ALT 18  ALKPHOS 103  BILITOT 1.0  BILIDIR 0.2  IBILI 0.8    Imaging: Dg Chest 2 View  09/10/2014   CLINICAL DATA:  Cough and shortness of breath  EXAM: CHEST  2 VIEW  COMPARISON:  10/16/2013  FINDINGS: Cardiac shadow is at the upper limits of normal in size but stable. Scarring is noted in the left lung base. No focal infiltrate or sizable effusion is seen. Degenerative change of thoracic spine is noted.  IMPRESSION: Chronic changes without acute abnormality.   Electronically Signed   By: Inez Catalina M.D.   On: 09/10/2014 13:40   Personally viewed.   Telemetry: 3.2 s pause 18:59. 9/11.  Heart rate in 30's.  Personally viewed.   EKG:  A. fib, 54 beats per minute, interventricular conduction delay, left ventricular hypertrophy  Cardiac Studies:  Awaiting echocardiogram  Assessment/Plan:  Principal Problem:   Atrial flutter, recurrent, history of RFA in 2006, at times brady. Active Problems:   DM (diabetes mellitus)   Hypothyroid   Dysphagia, J tube placed   Copd Golds C   Protein-calorie malnutrition, severe   Dyspnea   Tobacco abuse, in remission   Bradycardia   Thrombocytopenia  80 year old male with atrial fibrillation/flutter, history of heparin-induced thrombocytopenia, poor candidate for anticoagulation with diabetes, hypertension, hyperlipidemia, carotid artery disease, SVT ablation, peripheral arterial disease here with dyspnea and bradycardia (30s to 50s-3.2 second pause).  1. Atrial fibrillation with slow ventricular response-bradycardia has been an issue in the past according to records. No AV nodal blocking agents. Unclear whether or not bradycardia is precipitating shortness of breath, likely to be multifactorial including COPD. My hunch is that with long-term smoking history shortness of breath is being driven more by chronic lung disease/COPD.  Denies syncope. Tele with 3.2 sec pause 18:59 9/11. -Discussed with primary team. They will stop anticholinergics (tiotroprium) and cyproheptadine.   -If syncope occurs in future - pace. For now monitor on tele over next 24 hours.    2. Dyspnea-likely multifactorial as stated above. Pro BNP  was 1294, chest x-ray with no interstitial edema. Hemoglobin 13.7, no anemia.  He is not felt to be in oral anticoagulation candidate by his primary cardiologist.  2. History of tobacco use-stop smoking in 2013.  3. History of SVT-ablation  4. Diabetes-per primary team  5. Severe protein malnutrition - encourage diet.    SKAINS, [place] 09/11/2014, 8:31 AM

## 2014-09-12 ENCOUNTER — Observation Stay (HOSPITAL_COMMUNITY): Payer: PRIVATE HEALTH INSURANCE

## 2014-09-12 DIAGNOSIS — I4892 Unspecified atrial flutter: Secondary | ICD-10-CM | POA: Diagnosis not present

## 2014-09-12 DIAGNOSIS — J449 Chronic obstructive pulmonary disease, unspecified: Secondary | ICD-10-CM

## 2014-09-12 DIAGNOSIS — E119 Type 2 diabetes mellitus without complications: Secondary | ICD-10-CM

## 2014-09-12 LAB — GLUCOSE, CAPILLARY
GLUCOSE-CAPILLARY: 137 mg/dL — AB (ref 70–99)
GLUCOSE-CAPILLARY: 101 mg/dL — AB (ref 70–99)

## 2014-09-12 MED ORDER — ALBUTEROL SULFATE (2.5 MG/3ML) 0.083% IN NEBU: 2.5000 mg | INHALATION_SOLUTION | Freq: Four times a day (QID) | RESPIRATORY_TRACT | Status: DC | PRN

## 2014-09-12 MED ORDER — MIRTAZAPINE 30 MG PO TABS: 15.0000 mg | ORAL_TABLET | Freq: Every day | ORAL | Status: DC

## 2014-09-12 MED ORDER — OSMOLITE 1.5 CAL PO LIQD
1000.0000 mL | ORAL | Status: DC
  Filled 2014-09-12: qty 1000

## 2014-09-12 MED ORDER — MOMETASONE FURO-FORMOTEROL FUM 100-5 MCG/ACT IN AERO: 2.0000 | INHALATION_SPRAY | Freq: Two times a day (BID) | RESPIRATORY_TRACT | Status: DC

## 2014-09-12 NOTE — Discharge Summary (Signed)
Physician Discharge Summary  [name] PTW:[id number] DOB: Feb 06, 1931 DOA: 09/10/2014  PCP: Conni Slipper, NP  Admit date: 09/10/2014 Discharge date: 09/12/2014  Time spent: 22* minutes  Recommendations for Outpatient Follow-up:  1. *Follow up PCP in 2 weeks  Discharge Diagnoses:  Principal Problem:   Atrial flutter, recurrent, history of RFA in 2006, at times brady. Active Problems:   DM (diabetes mellitus)   Hypothyroid   Dysphagia, J tube placed   Copd Golds C   Protein-calorie malnutrition, severe   Dyspnea   Tobacco abuse, in remission   Bradycardia   Thrombocytopenia   Discharge Condition: Stable  Diet recommendation: Tube feedings  Filed Weights   09/10/14 1140 09/10/14 1834  Weight: 55.792 kg (123 lb) 56.4 kg (124 lb 5.4 oz)    History of present illness:  80 y.o. year-old male with history of DM2 with nephropathy, HTN, HLD, PVD, CVA, COPD Gold C, intermittent thrombocytopenia, HCC s/p right lobe resection in 2012 at [facility], SVT s/p ablation, paroxysmal atrial fibrillation and flutter with possible tachy-brady syndrome previously deemed not a good candidate for anticoagulation possibly due to intermittent thrombocytopenia and poor functional status, severe pharyngeal dysphagia with hx of aspiration pneumonia s/p PEG placement who eats despite known aspiration risk. He presents from home because of shortness of breath. He states that for the last year, he has had DOE. He can walk possibly up to half a block before he becomes SOB and needs to rest. For the last 4 months, he has had intermittent swelling of the ankles, but he denies orthopnea, PND, and chest tightness or pain. He states he uses his inhalers to help his breathing. Today, his SOB was his usual SOB, but he decided he wanted to find out what was causing it. He states he had hoped it would get better so he did not discuss this with his primary care doctor or his cardiologist, Dr. Gwenlyn Found.  In the ER, ECG  demonstrated atrial fibrillation vs. Possible atrial flutter and his rate varied from the 30s to the 60s with pauses to 2.5 seconds, not on any rate control medications. BP stable. Labs were notable for plt 84. Troponin negative. Pro-BNP was mildly elevated at 1294 but CXR was without acute changes. He was seen by Dr. Angelena Form who recommends observation overnight, ECHO, and possible pacemaker if he continues to have low ventricular rates.    Hospital Course:  Dyspnea on exertion- likely due to COPD exacerbation. No improved, We'll continue with albuterol when necessary, at home,  patient also had elevated BNP 1294. 2-D echocardiogram was done which showed EF 50-55%. Patient also has been started on Dulera 2 puffs twice a day. Will discontinue ipratropium and continue tiotropium.   Bradycardia- patient has bradycardia with heart rate down 40s and 50s, but he's asymptomatic. Patient has been on anticholinergic medications which can exacerbate bradycardia, hold tiotropium as well as Periactin. Cardiology followed the patient and does not recommend pacemaker at this time. Patient thought it has improved after stopping Periactin and discontinuing ipratropium. Patient heart rate goes up when he walks. If patient develops syncope, or other symptoms then he will require pacemaker   Paroxysmal atrial fibrillation- patient having persistent bradycardia, not on anticoagulation as he is not a good candidate as per cardiology   Dysphagia with aspiration- space therapy evaluation ordered, on dysphagia 1 diet with thick liquids  Severe protein calorie malnutrition- nutrition consulted   PEG tube dislodgment patient has PEG tube in place which was dislodged in the hospital.  I was consulted and PEG tube has been reinserted. Patient will continue to use PEG tube feedings at bedtime daily.   Diabetes mellitus- continue oral hypoglycemics  .   Procedures: None  Consultations:  Cardiology  Discharge  Exam: Filed Vitals:   09/12/14 1423  BP: 169/55  Pulse: 64  Temp: 97.4 F (36.3 C)  Resp: 20    General: Appear in no acute distress Cardiovascular: S1-S2 regular Respiratory: Clear to auscultation bilaterally  Discharge Instructions You were cared for by a hospitalist during your hospital stay. If you have any questions about your discharge medications or the care you received while you were in the hospital after you are discharged, you can call the unit and asked to speak with the hospitalist on call if the hospitalist that took care of you is not available. Once you are discharged, your primary care physician will handle any further medical issues. Please note that NO REFILLS for any discharge medications will be authorized once you are discharged, as it is imperative that you return to your primary care physician (or establish a relationship with a primary care physician if you do not have one) for your aftercare needs so that they can reassess your need for medications and monitor your lab values.  Discharge Instructions   Diet - low sodium heart healthy    Complete by:  As directed      Increase activity slowly    Complete by:  As directed           Current Discharge Medication List    START taking these medications   Details  mometasone-formoterol (DULERA) 100-5 MCG/ACT AERO Inhale 2 puffs into the lungs 2 (two) times daily. Qty: 1 Inhaler, Refills: 2      CONTINUE these medications which have CHANGED   Details  albuterol (PROVENTIL) (2.5 MG/3ML) 0.083% nebulizer solution Take 3 mLs (2.5 mg total) by nebulization every 6 (six) hours as needed for wheezing or shortness of breath. Qty: 75 mL, Refills: 12    mirtazapine (REMERON) 30 MG tablet Take 0.5 tablets (15 mg total) by mouth at bedtime. Qty: 30 tablet, Refills: 2      CONTINUE these medications which have NOT CHANGED   Details  aspirin EC 81 MG tablet Take 81 mg by mouth daily.    !! feeding supplement, ENSURE  COMPLETE, (ENSURE COMPLETE) LIQD Take 237 mLs by mouth 2 (two) times daily between meals. Qty: 60 Bottle, Refills: 0    levothyroxine (SYNTHROID, LEVOTHROID) 88 MCG tablet Take 88 mcg by mouth daily before breakfast.    linagliptin (TRADJENTA) 5 MG TABS tablet Take 5 mg by mouth daily.     !! Nutritional Supplements (FEEDING SUPPLEMENT, OSMOLITE 1.5 CAL,) LIQD Place 1,000 mLs into feeding tube continuous. Qty: 1000 mL, Refills: 0    oxyCODONE (OXY IR/ROXICODONE) 5 MG immediate release tablet Take 15 mg by mouth every 4 (four) hours as needed for severe pain (pain in neck & shoulder).     tiotropium (SPIRIVA) 18 MCG inhalation capsule Place 18 mcg into inhaler and inhale 2 (two) times daily.    Water For Irrigation, Sterile (FREE WATER) SOLN Place 200 mLs into feeding tube every 8 (eight) hours.     !! - Potential duplicate medications found. Please discuss with provider.    STOP taking these medications     cyproheptadine (PERIACTIN) 4 MG tablet      ipratropium (ATROVENT) 0.02 % nebulizer solution        No Known  Allergies    The results of significant diagnostics from this hospitalization (including imaging, microbiology, ancillary and laboratory) are listed below for reference.    Significant Diagnostic Studies: Dg Chest 2 View  09/10/2014   CLINICAL DATA:  Cough and shortness of breath  EXAM: CHEST  2 VIEW  COMPARISON:  10/16/2013  FINDINGS: Cardiac shadow is at the upper limits of normal in size but stable. Scarring is noted in the left lung base. No focal infiltrate or sizable effusion is seen. Degenerative change of thoracic spine is noted.  IMPRESSION: Chronic changes without acute abnormality.   Electronically Signed   By: Inez Catalina M.D.   On: 09/10/2014 13:40    Microbiology: No results found for this or any previous visit (from the past 240 hour(s)).   Labs: Basic Metabolic Panel:  Recent Labs Lab 09/10/14 1344 09/11/14 0410  NA 139 142  K 4.0 3.9  CL 99  103  CO2 30 31  GLUCOSE 127* 269*  BUN 26* 20  CREATININE 0.66 0.74  CALCIUM 9.1 9.0   Liver Function Tests:  Recent Labs Lab 09/10/14 1900  AST 32  ALT 18  ALKPHOS 103  BILITOT 1.0  PROT 7.6  ALBUMIN 3.5   No results found for this basename: LIPASE, AMYLASE,  in the last 168 hours No results found for this basename: AMMONIA,  in the last 168 hours CBC:  Recent Labs Lab 09/10/14 1344 09/11/14 0410  WBC 5.8 5.2  NEUTROABS 3.0  --   HGB 14.0 13.7  HCT 40.4 40.1  MCV 93.7 95.7  PLT 84* 82*   Cardiac Enzymes:  Recent Labs Lab 09/10/14 1344  TROPONINI <0.30   BNP: BNP (last 3 results)  Recent Labs  09/10/14 1344  PROBNP 1294.0*   CBG:  Recent Labs Lab 09/11/14 1119 09/11/14 1638 09/11/14 2127 09/12/14 0731 09/12/14 1208  GLUCAP 75 150* 161* 137* 101*       Signed:  Emylee Decelle S  [facility] 09/12/2014, 2:28 PM

## 2014-09-12 NOTE — Progress Notes (Signed)
Pt ambulated in hall with minimal assist, using rolling walker.  HR increased to 120-130.  No complaints of SOB or pain.  Pt stated he felt fine.  MD notified.

## 2014-09-12 NOTE — Progress Notes (Addendum)
Found patient with G-tube out and leaking around the site. No c/o pain or abdominal discomfort. Tube feeding stopped. NP on call notified. Instructions to cover G-tube site and hold meds until IR is able to reinsert G-tube in the morning. Will continue to monitor closely.

## 2014-09-12 NOTE — Procedures (Signed)
GJ repositioned, lumens in stomach and prox jej, ok to use. No complication No blood loss. See complete dictation in [facility].

## 2014-09-12 NOTE — Progress Notes (Signed)
UR completed 

## 2014-09-12 NOTE — Progress Notes (Signed)
Primary Cardiologist: Gwenlyn Found   Subjective:  Feels better. Lying flat. Appears quite elderly, thin, fragile.  He has not had any syncope, no dizziness surrounding his persistent bradycardia. Bradycardia increases during sleep time sometimes decreasing into the upper 20s. He pulled out G-tube yesterday.  Objective:  Vital Signs in the last 24 hours: Temp:  [97.8 F (36.6 C)-98 F (36.7 C)] 98 F (36.7 C) (09/13 0500) Pulse Rate:  [44-59] 58 (09/13 0500) Resp:  [16-20] 16 (09/13 0500) BP: (139-146)/(51-62) 146/62 mmHg (09/13 0500) SpO2:  [94 %-100 %] 99 % (09/13 0500)  Intake/Output from previous day: 09/12 0701 - 09/13 0700 In: 988.3 [P.O.:180; NG/GT:808.3] Out: 902 [Urine:901; Stool:1]   Physical Exam: General: Thin elderly in no acute distress. Head:  Normocephalic and atraumatic. Lungs: Poor air movement bilaterally. Heart: Irregularly irregular. Bradycardia No murmur, rubs or gallops.  Abdomen: soft, non-tender, positive bowel sounds. G-tube Extremities: No clubbing or cyanosis. No edema. Neurologic: Alert and oriented x 3.    Lab Results:  Recent Labs  09/10/14 1344 09/11/14 0410  WBC 5.8 5.2  HGB 14.0 13.7  PLT 84* 82*    Recent Labs  09/10/14 1344 09/11/14 0410  NA 139 142  K 4.0 3.9  CL 99 103  CO2 30 31  GLUCOSE 127* 269*  BUN 26* 20  CREATININE 0.66 0.74    Recent Labs  09/10/14 1344  TROPONINI <0.30   Hepatic Function Panel  Recent Labs  09/10/14 1900  PROT 7.6  ALBUMIN 3.5  AST 32  ALT 18  ALKPHOS 103  BILITOT 1.0  BILIDIR 0.2  IBILI 0.8    Imaging: Dg Chest 2 View  09/10/2014   CLINICAL DATA:  Cough and shortness of breath  EXAM: CHEST  2 VIEW  COMPARISON:  10/16/2013  FINDINGS: Cardiac shadow is at the upper limits of normal in size but stable. Scarring is noted in the left lung base. No focal infiltrate or sizable effusion is seen. Degenerative change of thoracic spine is noted.  IMPRESSION: Chronic changes without acute  abnormality.   Electronically Signed   By: Inez Catalina M.D.   On: 09/10/2014 13:40   Personally viewed.   Telemetry: 3.2 s pause 18:59. 9/11. Heart rate in 30's. Sometimes heart rate decreases into the upper 20s during sleep. Personally viewed.   EKG:  A. fib, 54 beats per minute, interventricular conduction delay, left ventricular hypertrophy  Cardiac Studies:  Awaiting echocardiogram  Assessment/Plan:  Principal Problem:   Atrial flutter, recurrent, history of RFA in 2006, at times brady. Active Problems:   DM (diabetes mellitus)   Hypothyroid   Dysphagia, J tube placed   Copd Golds C   Protein-calorie malnutrition, severe   Dyspnea   Tobacco abuse, in remission   Bradycardia   Thrombocytopenia  80 year old male with atrial fibrillation/flutter, history of heparin-induced thrombocytopenia, poor candidate for anticoagulation with diabetes, hypertension, hyperlipidemia, carotid artery disease, SVT ablation, peripheral arterial disease here with dyspnea and bradycardia (30s to 50s-3.2 second pause).  1. Atrial fibrillation with slow ventricular response-bradycardia has been an issue in the past according to records. No AV nodal blocking agents. It is likely that he is fairly asymptomatic from his bradycardia, i.e. no syncopal episodes reported, no dizziness. He shortness of breath is likely mostly secondary to deconditioning/COPD.   Denies syncope. Tele with 3.2 sec pause 18:59 9/11. -Discussed with primary team. Stopped anticholinergics on 9/12 (tiotroprium) and cyproheptadine.   -If syncope occurs in future - pace. Discussed with him. For now,  we will hold off on pacemaker. -Ambulate in hallway, demonstrate increase in heart rate. -When awakened, his heart rate does increase.    2. Dyspnea-likely multifactorial as stated above. Pro BNP was 1294, chest x-ray with no interstitial edema. Hemoglobin 13.7, no anemia.  He is not felt to be in oral anticoagulation candidate by his  primary cardiologist.  2. History of tobacco use-stop smoking in 2013.  3. History of SVT-ablation  4. Diabetes-per primary team  5. Severe protein malnutrition - encourage diet.    Tanyiah Laurich, [place] 09/12/2014, 8:48 AM

## 2014-09-12 NOTE — Progress Notes (Signed)
INITIAL NUTRITION ASSESSMENT  DOCUMENTATION CODES Per approved criteria  -Severe malnutrition in the context of chronic illness -Underweight   INTERVENTION: Once PEG replaced, continue current TF regimen of Osmolite 1.5 at 100 ml/hr x 12 hours at night. This regimen provides 1800 kcal (100% of estimated needs), 76 grams protein (90% of estimated needs) and 914 ml free water daily. Continue Ensure Complete as able. RD provided pt with several coupons for Boost supplements for when d/c. RD to continue to follow nutrition care plan.  NUTRITION DIAGNOSIS: Inadequate oral intake related to severe pharyngeal dysphagia as evidenced by G-tube dependent.   Goal: Intake to meet >90% of estimated nutrition needs.  Monitor:  weight trends, lab trends, I/O's, PO intake, supplement tolerance, TF tolerance/compliance  Reason for Assessment: MD Consult for TF Initiation/Management  80 y.o. male  Admitting Dx: Atrial flutter  ASSESSMENT: PMHx significant for DM2 with nephropathy, HTN, HLD, PVD, CVA, COPD, intermittent thrombocytopenia, HCC s/p right lobe resection in 2012 at [facility], SVT s/p ablation, paroxysmal atrial fibrillation and flutter with possible tachy-brady syndrome previously deemed not a good candidate for anticoagulation possibly due to intermittent thrombocytopenia and poor functional status, severe pharyngeal dysphagia with hx of aspiration pneumonia s/p PEG placement who eats despite known aspiration risk. Admitted with SOB. Work-up reveals COPD exacerbation and bradycardia.  BSE completed by SLP on 9/12. Pt was trialed on purees, and is found to be at moderate aspiration risk. Plan is for MBSS 9/14 or if pt discharges prior to that, then arrange outpatient MBSS.  Remains on Dysphagia 1 diet with Honey-Thickened Liquids at this time.  G-tube was inadvertently removed by pt on 9/12. Was previously ordered for Osmolite 1.5 at 100 ml/hr from 8p-8a, this provides: 1800 kcal, 76 grams  protein and 914 ml free water daily. Pt confirms that this is his nutrition regimen at home. Currently receiving 200 ml free water TID via tube. This provides 800 ml free water.  Pt reports that he eats minimally throughout the day. He states that eats things such as eggs, liver pudding, and drinks a lot of diet pepsi.  Nutrition Focused Physical Exam:  Subcutaneous Fat:  Orbital Region: WNL Upper Arm Region: severe depletion Thoracic and Lumbar Region: severe depletion  Muscle:  Temple Region: moderate depletion Clavicle Bone Region: severe depletion Clavicle and Acromion Bone Region: severe depletion Scapular Bone Region: severe depletion Dorsal Hand: severe depletion Patellar Region: severe depletion Anterior Thigh Region: severe depletion Posterior Calf Region: severe depletion  Edema: none  Pt meets criteria for severe MALNUTRITION in the context of chronic illness as evidenced by severe fat and muscle mass loss.  Pt's weight has been relatively stable. He reports that he weighs himself daily.  CBG's: 137 - 161 Potassium WNL  Height: Ht Readings from Last 1 Encounters:  09/10/14 5\' 9"  (1.753 m)    Weight: Wt Readings from Last 1 Encounters:  09/10/14 124 lb 5.4 oz (56.4 kg)    Ideal Body Weight: 160 lb  % Ideal Body Weight: 78%  Wt Readings from Last 10 Encounters:  09/10/14 124 lb 5.4 oz (56.4 kg)  06/09/14 121 lb (54.885 kg)  06/01/14 126 lb (57.153 kg)  12/05/13 120 lb 3.2 oz (54.522 kg)  10/15/13 120 lb (54.432 kg)  03/21/13 114 lb (51.71 kg)  01/14/13 129 lb (58.514 kg)  12/05/12 116 lb 6.4 oz (52.799 kg)  11/09/12 119 lb 0.8 oz (54 kg)  11/03/12 128 lb 4.9 oz (58.2 kg)    Usual Body Weight:  120 lb  % Usual Body Weight: 103%  BMI:  Body mass index is 18.35 kg/(m^2). Underweight  Estimated Nutritional Needs: Kcal: 1700 - 2000 Protein: at least 84 grams daily Fluid: approx 1.8 - 2 liters daily  Skin: intact  Diet Order: NPO  EDUCATION  NEEDS: -No education needs identified at this time   Intake/Output Summary (Last 24 hours) at 09/12/14 0934 Last data filed at 09/12/14 0700  Gross per 24 hour  Intake 988.33 ml  Output    602 ml  Net 386.33 ml    Last BM: 9/12  Labs:   Recent Labs Lab 09/10/14 1344 09/11/14 0410  NA 139 142  K 4.0 3.9  CL 99 103  CO2 30 31  BUN 26* 20  CREATININE 0.66 0.74  CALCIUM 9.1 9.0  GLUCOSE 127* 269*    CBG (last 3)   Recent Labs  09/11/14 1638 09/11/14 2127 09/12/14 0731  GLUCAP 150* 161* 137*    Scheduled Meds: . aspirin  81 mg Per Tube Daily  . docusate  100 mg Per Tube BID  . feeding supplement (ENSURE COMPLETE)  237 mL Per Tube BID BM  . feeding supplement (OSMOLITE 1.5 CAL)  1,000 mL Per Tube Q24H  . free water  200 mL Per Tube 3 times per day  . insulin aspart  0-9 Units Subcutaneous TID WC  . levothyroxine  88 mcg Per Tube QAC breakfast  . mometasone-formoterol  2 puff Inhalation BID  . sodium chloride  3 mL Intravenous Q12H    Continuous Infusions:   Past Medical History  Diagnosis Date  . DM2 (diabetes mellitus, type 2)   . Hyperlipidemia   . Hypertension   . Cerebral vascular disease   . [place] (hepatocellular carcinoma)     s/p right lobe resection 2012  . Blood dyscrasia     THROMBCYTOPATHIA  . Peripheral vascular disease     LICA stent [id number]  . Pneumonia 11/03/2012    aspiration pneumonias  . COPD (chronic obstructive pulmonary disease)     Gold C  . Thrombocytopenia   . Severe malnutrition   . Dysphagia     s/p PEG placement  . Paroxysmal atrial flutter     and fibrillation with known bradycardia, Chads2vasc of 4, not on A/C due to thrombocytopenia and functional status  . Dissecting aortic aneurysm, abdominal 11/13/2011    Overview:  Severe aortoiliac stenosis with possible a chronic dissection versus  ulcerated plaque/thrombus extending above and below the aortic  bifurcation; without aneurysmal dilation  as per CT abdomen noted  09/25/2011     Past Surgical History  Procedure Laterality Date  . Carotid stent  2002  . Liver canc    . Posterior cervical fusion/foraminotomy  09/25/2012    Procedure: POSTERIOR CERVICAL FUSION/FORAMINOTOMY LEVEL 2;  Surgeon: Eustace Moore, MD;  Location: [place] [facility];  Service: Neurosurgery;  Laterality: N/A;  Posterior Cervical three-four laminectomy, posterior cervical three-four, four-five fusion  . Esophagogastroduodenoscopy  10/01/2012    Procedure: ESOPHAGOGASTRODUODENOSCOPY (EGD);  Surgeon: Gwenyth Ober, MD;  Location: [place];  Service: General;  Laterality: N/A;  wyatt/leone  . Peg placement  10/01/2012    Procedure: PERCUTANEOUS ENDOSCOPIC GASTROSTOMY (PEG) PLACEMENT;  Surgeon: Gwenyth Ober, MD;  Location: [place];  Service: General;  Laterality: N/A;  . Esophagogastroduodenoscopy  10/31/2012    Procedure: ESOPHAGOGASTRODUODENOSCOPY (EGD);  Surgeon: Beryle Beams, MD;  Location: [facility] [place] [facility];  Service: Endoscopy;  Laterality: N/A;    Inda Coke  MS, RD, LDN Inpatient Registered Dietitian After-hours pager: [phone]

## 2014-09-18 ENCOUNTER — Emergency Department (HOSPITAL_COMMUNITY)
Admission: EM | Admit: 2014-09-18 | Discharge: 2014-09-18 | Disposition: A | Discharge status: Home / Self Care | Payer: PRIVATE HEALTH INSURANCE | Attending: Emergency Medicine | Admitting: Emergency Medicine

## 2014-09-18 ENCOUNTER — Emergency Department (HOSPITAL_COMMUNITY): Payer: PRIVATE HEALTH INSURANCE

## 2014-09-18 ENCOUNTER — Ambulatory Visit (HOSPITAL_COMMUNITY)
Admission: RE | Admit: 2014-09-18 | Discharge: 2014-09-18 | Disposition: A | Discharge status: Home / Self Care | Payer: PRIVATE HEALTH INSURANCE | Source: Ambulatory Visit | Attending: Emergency Medicine | Admitting: Emergency Medicine

## 2014-09-18 ENCOUNTER — Encounter (HOSPITAL_COMMUNITY): Payer: Self-pay | Admitting: Emergency Medicine

## 2014-09-18 DIAGNOSIS — Z8505 Personal history of malignant neoplasm of liver: Secondary | ICD-10-CM | POA: Insufficient documentation

## 2014-09-18 DIAGNOSIS — Z9861 Coronary angioplasty status: Secondary | ICD-10-CM | POA: Diagnosis not present

## 2014-09-18 DIAGNOSIS — J4489 Other specified chronic obstructive pulmonary disease: Secondary | ICD-10-CM | POA: Insufficient documentation

## 2014-09-18 DIAGNOSIS — I10 Essential (primary) hypertension: Secondary | ICD-10-CM | POA: Insufficient documentation

## 2014-09-18 DIAGNOSIS — K9423 Gastrostomy malfunction: Secondary | ICD-10-CM | POA: Insufficient documentation

## 2014-09-18 DIAGNOSIS — Z8701 Personal history of pneumonia (recurrent): Secondary | ICD-10-CM | POA: Insufficient documentation

## 2014-09-18 DIAGNOSIS — Z862 Personal history of diseases of the blood and blood-forming organs and certain disorders involving the immune mechanism: Secondary | ICD-10-CM | POA: Insufficient documentation

## 2014-09-18 DIAGNOSIS — IMO0002 Reserved for concepts with insufficient information to code with codable children: Secondary | ICD-10-CM | POA: Diagnosis not present

## 2014-09-18 DIAGNOSIS — Z79899 Other long term (current) drug therapy: Secondary | ICD-10-CM | POA: Insufficient documentation

## 2014-09-18 DIAGNOSIS — Z7982 Long term (current) use of aspirin: Secondary | ICD-10-CM | POA: Diagnosis not present

## 2014-09-18 DIAGNOSIS — E119 Type 2 diabetes mellitus without complications: Secondary | ICD-10-CM | POA: Insufficient documentation

## 2014-09-18 DIAGNOSIS — J449 Chronic obstructive pulmonary disease, unspecified: Secondary | ICD-10-CM | POA: Diagnosis not present

## 2014-09-18 DIAGNOSIS — T85598A Other mechanical complication of other gastrointestinal prosthetic devices, implants and grafts, initial encounter: Secondary | ICD-10-CM

## 2014-09-18 DIAGNOSIS — Z431 Encounter for attention to gastrostomy: Secondary | ICD-10-CM | POA: Insufficient documentation

## 2014-09-18 DIAGNOSIS — Z87891 Personal history of nicotine dependence: Secondary | ICD-10-CM | POA: Diagnosis not present

## 2014-09-18 MED ORDER — LIDOCAINE HCL 1 % IJ SOLN
INTRAMUSCULAR | Status: AC
  Filled 2014-09-18: qty 20

## 2014-09-18 MED ORDER — IOHEXOL 300 MG/ML  SOLN
50.0000 mL | Freq: Once | INTRAMUSCULAR | Status: AC | PRN
  Administered 2014-09-18: 15 mL

## 2014-09-18 NOTE — Procedures (Signed)
Successful fluoroscopic guided replacement/upsizing of a new 24 Fr gastrojejunostomy tube.  No immediate post procedural complications.  The feeding tube is ready for immediate use.

## 2014-09-18 NOTE — ED Notes (Signed)
Pt states he feels like feeding tube has become loose sometime after 2000 tonight. But appears to still be in place but moveable. Yellow drainage noted on bandage.

## 2014-09-18 NOTE — ED Notes (Signed)
Pt transported to Xray. 

## 2014-09-18 NOTE — ED Provider Notes (Signed)
CSN: [id number]     Arrival date & time 09/18/14  0439 History   First MD Initiated Contact with Patient 09/18/14 [phone]     Chief Complaint  Patient presents with  . Feeding tube loose      HPI Patient presents to the emergency room with complaints of feeding tube complications. Patient has had a gastric feeding tube for a couple of years. Today he has currently has been there for the last year. Last night he noticed that he was having leaking and drainage from his feeding tube. Patient states he felt like he was having gastric contents leak around the site. It was causing irritation to the skin. He denies any trouble with abdominal pain, vomiting, diarrhea, fevers or chills.  Past Medical History  Diagnosis Date  . DM2 (diabetes mellitus, type 2)   . Hyperlipidemia   . Hypertension   . Cerebral vascular disease   . [place] (hepatocellular carcinoma)     s/p right lobe resection 2012  . Blood dyscrasia     THROMBCYTOPATHIA  . Peripheral vascular disease     LICA stent [id number]  . Pneumonia 11/03/2012    aspiration pneumonias  . COPD (chronic obstructive pulmonary disease)     Gold C  . Thrombocytopenia   . Severe malnutrition   . Dysphagia     s/p PEG placement  . Paroxysmal atrial flutter     and fibrillation with known bradycardia, Chads2vasc of 4, not on A/C due to thrombocytopenia and functional status  . Dissecting aortic aneurysm, abdominal 11/13/2011    Overview:  Severe aortoiliac stenosis with possible a chronic dissection versus  ulcerated plaque/thrombus extending above and below the aortic  bifurcation; without aneurysmal dilation  as per CT abdomen noted 09/25/2011    Past Surgical History  Procedure Laterality Date  . Carotid stent  2002  . Liver canc    . Posterior cervical fusion/foraminotomy  09/25/2012    Procedure: POSTERIOR CERVICAL FUSION/FORAMINOTOMY LEVEL 2;  Surgeon: Eustace Moore, MD;  Location: [place] [facility];  Service: Neurosurgery;  Laterality: N/A;  Posterior  Cervical three-four laminectomy, posterior cervical three-four, four-five fusion  . Esophagogastroduodenoscopy  10/01/2012    Procedure: ESOPHAGOGASTRODUODENOSCOPY (EGD);  Surgeon: Gwenyth Ober, MD;  Location: [place];  Service: General;  Laterality: N/A;  wyatt/leone  . Peg placement  10/01/2012    Procedure: PERCUTANEOUS ENDOSCOPIC GASTROSTOMY (PEG) PLACEMENT;  Surgeon: Gwenyth Ober, MD;  Location: [place];  Service: General;  Laterality: N/A;  . Esophagogastroduodenoscopy  10/31/2012    Procedure: ESOPHAGOGASTRODUODENOSCOPY (EGD);  Surgeon: Beryle Beams, MD;  Location: [facility] END[facility]COPY;  Service: Endoscopy;  Laterality: N/A;   Family History  Problem Relation Age of Onset  . CAD Neg Hx   . Cancer Father     throat, died young  . Liver disease Brother    History  Substance Use Topics  . Smoking status: Former Smoker -- 1.00 packs/day for 50 years    Types: Cigarettes    Quit date: 01/01/2012  . Smokeless tobacco: Never Used  . Alcohol Use: Yes     Comment: 3 beers per week    Review of Systems  All other systems reviewed and are negative.     Allergies  Review of patient's allergies indicates no known allergies.  Home Medications   Prior to Admission medications   Medication Sig Start Date End Date Taking? Authorizing Provider  albuterol (PROVENTIL) (2.5 MG/3ML) 0.083% nebulizer solution Take 3 mLs (2.5 mg total) by  nebulization every 6 (six) hours as needed for wheezing or shortness of breath. 09/12/14  Yes Oswald Hillock, MD  aspirin EC 81 MG tablet Take 81 mg by mouth daily.   Yes Historical Provider, MD  levothyroxine (SYNTHROID, LEVOTHROID) 88 MCG tablet Take 88 mcg by mouth daily before breakfast.   Yes Historical Provider, MD  linagliptin (TRADJENTA) 5 MG TABS tablet Take 5 mg by mouth daily.    Yes Historical Provider, MD  mirtazapine (REMERON) 30 MG tablet Take 0.5 tablets (15 mg total) by mouth at bedtime. 09/12/14  Yes Oswald Hillock, MD  mometasone-formoterol  (DULERA) 100-5 MCG/ACT AERO Inhale 2 puffs into the lungs 2 (two) times daily. 09/12/14  Yes Oswald Hillock, MD  Nutritional Supplements (FEEDING SUPPLEMENT, OSMOLITE 1.5 CAL,) LIQD Place 1,000 mLs into feeding tube continuous. 10/19/13  Yes Nita Sells, MD  oxyCODONE (OXY IR/ROXICODONE) 5 MG immediate release tablet Take 15 mg by mouth every 4 (four) hours as needed for severe pain (pain in neck & shoulder).    Yes Historical Provider, MD  tiotropium (SPIRIVA) 18 MCG inhalation capsule Place 18 mcg into inhaler and inhale 2 (two) times daily.   Yes Historical Provider, MD  Water For Irrigation, Sterile (FREE WATER) SOLN Place 200 mLs into feeding tube every 8 (eight) hours. 10/19/13  Yes Nita Sells, MD   BP 131/67  Pulse 56  Temp(Src) 97.9 F (36.6 C) (Oral)  Resp 19  Ht 5\' 9"  (1.753 m)  Wt 123 lb (55.792 kg)  BMI 18.16 kg/m2  SpO2 98% Physical Exam  Nursing note and vitals reviewed. Constitutional: No distress.  Elderly, frail  HENT:  Head: Normocephalic and atraumatic.  Right Ear: External ear normal.  Left Ear: External ear normal.  Eyes: Conjunctivae are normal. Right eye exhibits no discharge. Left eye exhibits no discharge. No scleral icterus.  Neck: Neck supple. No tracheal deviation present.  Cardiovascular: Normal rate, regular rhythm and normal heart sounds.   Pulmonary/Chest: Effort normal and breath sounds normal. No stridor. No respiratory distress. He has no wheezes. He has no rales.  Abdominal: Soft. He exhibits no distension and no mass. There is no tenderness. There is no rebound and no guarding.  Gastric feeding tube in place, the tube appears very old and brittle, small amount yellow gastric contents on a gauze pad around the site  Musculoskeletal: He exhibits no edema and no tenderness.  Neurological: He is alert. Cranial nerve deficit: no gross deficits.  Skin: Skin is warm and dry. No rash noted.  Psychiatric: He has a normal mood and affect.     ED Course  Procedures (including critical care time) Labs Review Labs Reviewed - No data to display  Imaging Review Dg Chest 2 View  09/18/2014   CLINICAL DATA:  Shortness of breath.  Smoker.  EXAM: CHEST  2 VIEW  COMPARISON:  09/10/2014.  FINDINGS: Normal sized heart. Stable linear scarring at the left lung base. The remainder of the lungs are clear and hyperexpanded. Mild central peribronchial thickening. Thoracic spine degenerative changes. Multiple surgical clips in the right upper abdomen.  IMPRESSION: 1. No acute abnormality. 2. Mild changes of COPD and chronic bronchitis.   Electronically Signed   By: Enrique Sack M.D.   On: 09/18/2014 07:58     MDM   Final diagnoses:  Feeding tube dysfunction, initial encounter   The patient is having issues with his feeding tube today. This appeared to be painting but is having issues with the tube  leaking.  I discussed the case with interventional radiology. There are doing procedures today at [facility]. They asked if the patient could be safely transferred over there. Patient is otherwise having no acute medical issues. I discussed the plan with this and he'll be discharged from emergency room. I instructed him that he is to go to the radiology Department at [facility] and they will evaluate him for a feeding tube change. The patient is agreeable to this plan   Dorie Rank, MD 09/18/14 [phone]

## 2014-09-18 NOTE — Discharge Instructions (Signed)
Gastric Tube Replacement °You are having your gastric tube (the tube that goes into the stomach) changed. This is usually a very minor procedure. If medications are prescribed, take them as directed. Only take over-the-counter or prescription medications for pain, discomfort, or fever as directed by your caregiver.  °SEEK IMMEDIATE MEDICAL CARE IF:  °· You develop chills, fever, or show signs of generalized illness. °· You develop bleeding around the tube. °· Your new tube does not seem to be working properly. °· You are unable to get feedings into the tube. °· Your tube comes out for any reason. °Document Released: 09/11/2001 Document Revised: 03/10/2012 Document Reviewed: 12/17/2005 °[employer]® Patient Information ©2015 [employer]. This information is not intended to replace advice given to you by your health care provider. Make sure you discuss any questions you have with your health care provider. ° °

## 2014-09-18 NOTE — ED Notes (Signed)
Pt returned from X-ray.  

## 2014-09-19 ENCOUNTER — Encounter (HOSPITAL_COMMUNITY): Payer: Self-pay | Admitting: Emergency Medicine

## 2014-09-19 ENCOUNTER — Emergency Department (HOSPITAL_COMMUNITY)
Admission: EM | Admit: 2014-09-19 | Discharge: 2014-09-19 | Disposition: A | Discharge status: Home / Self Care | Payer: PRIVATE HEALTH INSURANCE | Attending: Emergency Medicine | Admitting: Emergency Medicine

## 2014-09-19 DIAGNOSIS — Z7982 Long term (current) use of aspirin: Secondary | ICD-10-CM | POA: Insufficient documentation

## 2014-09-19 DIAGNOSIS — Z79899 Other long term (current) drug therapy: Secondary | ICD-10-CM | POA: Insufficient documentation

## 2014-09-19 DIAGNOSIS — Z87891 Personal history of nicotine dependence: Secondary | ICD-10-CM | POA: Diagnosis not present

## 2014-09-19 DIAGNOSIS — Z8505 Personal history of malignant neoplasm of liver: Secondary | ICD-10-CM | POA: Insufficient documentation

## 2014-09-19 DIAGNOSIS — J4489 Other specified chronic obstructive pulmonary disease: Secondary | ICD-10-CM | POA: Insufficient documentation

## 2014-09-19 DIAGNOSIS — Z862 Personal history of diseases of the blood and blood-forming organs and certain disorders involving the immune mechanism: Secondary | ICD-10-CM | POA: Diagnosis not present

## 2014-09-19 DIAGNOSIS — I10 Essential (primary) hypertension: Secondary | ICD-10-CM | POA: Insufficient documentation

## 2014-09-19 DIAGNOSIS — Z8701 Personal history of pneumonia (recurrent): Secondary | ICD-10-CM | POA: Diagnosis not present

## 2014-09-19 DIAGNOSIS — J449 Chronic obstructive pulmonary disease, unspecified: Secondary | ICD-10-CM | POA: Insufficient documentation

## 2014-09-19 DIAGNOSIS — K9423 Gastrostomy malfunction: Secondary | ICD-10-CM

## 2014-09-19 DIAGNOSIS — Z9861 Coronary angioplasty status: Secondary | ICD-10-CM | POA: Insufficient documentation

## 2014-09-19 DIAGNOSIS — E119 Type 2 diabetes mellitus without complications: Secondary | ICD-10-CM | POA: Diagnosis not present

## 2014-09-19 DIAGNOSIS — IMO0002 Reserved for concepts with insufficient information to code with codable children: Secondary | ICD-10-CM | POA: Insufficient documentation

## 2014-09-19 DIAGNOSIS — E41 Nutritional marasmus: Secondary | ICD-10-CM | POA: Insufficient documentation

## 2014-09-19 NOTE — Discharge Instructions (Signed)
Keep area around G tube site very clean and dry at all times.  This may involve changing the drain sponge/dressing several times per day, as needed.  Follow up with your doctor for recheck tomorrow. Return to ER if worse, new symptoms, fevers, vomiting, abdominal pain, other concern.     Care of a Feeding Tube People who have trouble swallowing or cannot take food or medicine by mouth are sometimes given feeding tubes. A feeding tube can go into the nose and down to the stomach or through the skin in the abdomen and into the stomach or small bowel. Some of the names of these feeding tubes are gastrostomy tubes, PEG lines, nasogastric tubes, and gastrojejunostomy tubes.  SUPPLIES NEEDED TO CARE FOR THE TUBE SITE  Clean gloves.  Clean wash cloth, gauze pads, or soft paper towel.  Cotton swabs.  Skin barrier ointment or cream.  Soap and water.  Pre-cut foam pads or gauze (that go around the tube).  Tube tape. TUBE SITE CARE 1. Have all supplies ready and available. 2. Wash hands well. 3. Put on clean gloves. 4. Remove the soiled foam pad or gauze, if present, that is found under the tube stabilizer. Change the foam pad or gauze daily or when soiled or moist. 5. Check the skin around the tube site for redness, rash, swelling, drainage, or extra tissue growth. If you notice any of these, call your caregiver. 6. Moisten gauze and cotton swabs with water and soap. 7. Wipe the area closest to the tube (right near the stoma) with cotton swabs. Wipe the surrounding skin with moistened gauze. Rinse with water. 8. Dry the skin and stoma site with a dry gauze pad or soft paper towel. Do not use antibiotic ointments at the tube site. 9. If the skin is red, apply a skin barrier cream or ointment (such as petroleum jelly) in a circular motion, using a cotton swab. The cream or ointment will provide a moisture barrier for the skin and helps with wound healing. 10. Apply a new pre-cut foam pad or  gauze around the tube. Secure it with tape around the edges. If no drainage is present, foam pads or gauze may be left off. 11. Use tape or an anchoring device to fasten the feeding tube to the skin for comfort or as directed. Rotate where you tape the tube to avoid skin damage from the adhesive. 12. Position the person in a semi-upright position (30-45 degree angle). 13. Throw away used supplies. 14. Remove gloves. 15. Wash hands. SUPPLIES NEEDED TO FLUSH A FEEDING TUBE  Clean gloves.  60 mL syringe (that connects to the feeding tube).  Towel.  Water. FLUSHING A FEEDING TUBE  1. Have all supplies ready and available. 2. Wash hands well. 3. Put on clean gloves. 4. Draw up 30 mL of water in the syringe. 5. Kink the feeding tube while disconnecting it from the feeding-bag tubing or while removing the plug at the end of the tube. Kinking closes the tube and prevents secretions in the tube from spilling out. 6. Insert the tip of the syringe into the end of the feeding tube. Release the kink. Slowly inject the water. 7. If unable to inject the water, the person with the feeding tube should lay on his or her left side. The tip of the tube may be against the stomach wall, blocking fluid flow. Changing positions may move the tip away from the stomach wall. After repositioning, try injecting the water again. 8.  After injecting the water, remove the syringe. 9. Always flush before giving the first medicine, between medicines, and after the final medicine before starting a feeding. This prevents medicines from clogging the tube. 10. Throw away used supplies. 11. Remove gloves. 12. Wash hands. Document Released: 12/17/2005 Document Revised: 12/03/2012 Document Reviewed: 07/31/2012 [facility] Patient Information 2015 [place], [place]. This information is not intended to replace advice given to you by your health care provider. Make sure you discuss any questions you have with your health care  provider.

## 2014-09-19 NOTE — ED Provider Notes (Addendum)
CSN: [id number]     Arrival date & time 09/19/14  3818 History   First MD Initiated Contact with Patient 09/19/14 0827     Chief Complaint  Patient presents with  . GI Problem     (Consider location/radiation/quality/duration/timing/severity/associated sxs/prior Treatment) Patient is a 80 y.o. male presenting with GI illness. The history is provided by the patient.  GI Problem Pertinent negatives include no chest pain, no abdominal pain, no headaches and no shortness of breath.  pt presents c/o drainage around g tube site.  Has had GJ tube since 2013. States he gets all tube feeds through G port, and does meds through J port.  During recent hospital stay, pts balloon had been pulled through into subcut tissue and eventually out/external to pt.  Pt had IR adjust tube then.  Pt presented yesterday w drainage of gastric contents/tube feeds around tube, irritating skin.  He underwent GJ tube change in IR yesterday without complication.  Today states drainage persists.   Is tolerating tube feedings. No nv. No abd pain. No fever or chills.      Past Medical History  Diagnosis Date  . DM2 (diabetes mellitus, type 2)   . Hyperlipidemia   . Hypertension   . Cerebral vascular disease   . [place] (hepatocellular carcinoma)     s/p right lobe resection 2012  . Blood dyscrasia     THROMBCYTOPATHIA  . Peripheral vascular disease     LICA stent [id number]  . Pneumonia 11/03/2012    aspiration pneumonias  . COPD (chronic obstructive pulmonary disease)     Gold C  . Thrombocytopenia   . Severe malnutrition   . Dysphagia     s/p PEG placement  . Paroxysmal atrial flutter     and fibrillation with known bradycardia, Chads2vasc of 4, not on A/C due to thrombocytopenia and functional status  . Dissecting aortic aneurysm, abdominal 11/13/2011    Overview:  Severe aortoiliac stenosis with possible a chronic dissection versus  ulcerated plaque/thrombus extending above and below the aortic  bifurcation;  without aneurysmal dilation  as per CT abdomen noted 09/25/2011    Past Surgical History  Procedure Laterality Date  . Carotid stent  2002  . Liver canc    . Posterior cervical fusion/foraminotomy  09/25/2012    Procedure: POSTERIOR CERVICAL FUSION/FORAMINOTOMY LEVEL 2;  Surgeon: Eustace Moore, MD;  Location: [place] [facility];  Service: Neurosurgery;  Laterality: N/A;  Posterior Cervical three-four laminectomy, posterior cervical three-four, four-five fusion  . Esophagogastroduodenoscopy  10/01/2012    Procedure: ESOPHAGOGASTRODUODENOSCOPY (EGD);  Surgeon: Gwenyth Ober, MD;  Location: [place];  Service: General;  Laterality: N/A;  wyatt/leone  . Peg placement  10/01/2012    Procedure: PERCUTANEOUS ENDOSCOPIC GASTROSTOMY (PEG) PLACEMENT;  Surgeon: Gwenyth Ober, MD;  Location: [place];  Service: General;  Laterality: N/A;  . Esophagogastroduodenoscopy  10/31/2012    Procedure: ESOPHAGOGASTRODUODENOSCOPY (EGD);  Surgeon: Beryle Beams, MD;  Location: [facility] END[facility];  Service: Endoscopy;  Laterality: N/A;   Family History  Problem Relation Age of Onset  . CAD Neg Hx   . Cancer Father     throat, died young  . Liver disease Brother    History  Substance Use Topics  . Smoking status: Former Smoker -- 1.00 packs/day for 50 years    Types: Cigarettes    Quit date: 01/01/2012  . Smokeless tobacco: Never Used  . Alcohol Use: Yes     Comment: 3 beers per week  Review of Systems  Constitutional: Negative for fever and chills.  HENT: Negative for sore throat.   Eyes: Negative for redness.  Respiratory: Negative for shortness of breath.   Cardiovascular: Negative for chest pain.  Gastrointestinal: Negative for vomiting and abdominal pain.  Genitourinary: Negative for flank pain.  Musculoskeletal: Negative for back pain and neck pain.  Skin: Negative for rash.  Neurological: Negative for headaches.  Hematological: Does not bruise/bleed easily.  Psychiatric/Behavioral: Negative for  confusion.      Allergies  Review of patient's allergies indicates no known allergies.  Home Medications   Prior to Admission medications   Medication Sig Start Date End Date Taking? Authorizing Provider  albuterol (PROVENTIL) (2.5 MG/3ML) 0.083% nebulizer solution Take 3 mLs (2.5 mg total) by nebulization every 6 (six) hours as needed for wheezing or shortness of breath. 09/12/14   Oswald Hillock, MD  aspirin EC 81 MG tablet Take 81 mg by mouth daily.    Historical Provider, MD  levothyroxine (SYNTHROID, LEVOTHROID) 88 MCG tablet Take 88 mcg by mouth daily before breakfast.    Historical Provider, MD  linagliptin (TRADJENTA) 5 MG TABS tablet Take 5 mg by mouth daily.     Historical Provider, MD  mirtazapine (REMERON) 30 MG tablet Take 0.5 tablets (15 mg total) by mouth at bedtime. 09/12/14   Oswald Hillock, MD  mometasone-formoterol (DULERA) 100-5 MCG/ACT AERO Inhale 2 puffs into the lungs 2 (two) times daily. 09/12/14   Oswald Hillock, MD  Nutritional Supplements (FEEDING SUPPLEMENT, OSMOLITE 1.5 CAL,) LIQD Place 1,000 mLs into feeding tube continuous. 10/19/13   Nita Sells, MD  oxyCODONE (OXY IR/ROXICODONE) 5 MG immediate release tablet Take 15 mg by mouth every 4 (four) hours as needed for severe pain (pain in neck & shoulder).     Historical Provider, MD  tiotropium (SPIRIVA) 18 MCG inhalation capsule Place 18 mcg into inhaler and inhale 2 (two) times daily.    Historical Provider, MD  Water For Irrigation, Sterile (FREE WATER) SOLN Place 200 mLs into feeding tube every 8 (eight) hours. 10/19/13   Nita Sells, MD   BP 121/61  Pulse 65  Temp(Src) 97.6 F (36.4 C) (Oral)  Resp 24  SpO2 99% Physical Exam  Nursing note and vitals reviewed. Constitutional: He is oriented to person, place, and time. He appears well-developed and well-nourished. No distress.  HENT:  Mouth/Throat: Oropharynx is clear and moist.  Eyes: Conjunctivae are normal. No scleral icterus.  Neck: Neck  supple. No tracheal deviation present.  Cardiovascular: Normal rate, regular rhythm, normal heart sounds and intact distal pulses.   Pulmonary/Chest: Effort normal and breath sounds normal. No accessory muscle usage. No respiratory distress.  Abdominal: Soft. Bowel sounds are normal. He exhibits no distension. There is no tenderness. There is no rebound and no guarding.  Drain sponge saturated w tube feeds/yellow gastric contents. No active drainage. No purulent drainage. Skin excoriated in and around tube site. No cellulitis.   Musculoskeletal: Normal range of motion. He exhibits no edema.  Neurological: He is alert and oriented to person, place, and time.  Skin: Skin is warm and dry. He is not diaphoretic.  Psychiatric: He has a normal mood and affect.    ED Course  Procedures (including critical care time)  Imaging Review Dg Chest 2 View  09/18/2014   CLINICAL DATA:  Shortness of breath.  Smoker.  EXAM: CHEST  2 VIEW  COMPARISON:  09/10/2014.  FINDINGS: Normal sized heart. Stable linear scarring at the left  lung base. The remainder of the lungs are clear and hyperexpanded. Mild central peribronchial thickening. Thoracic spine degenerative changes. Multiple surgical clips in the right upper abdomen.  IMPRESSION: 1. No acute abnormality. 2. Mild changes of COPD and chronic bronchitis.   Electronically Signed   By: Enrique Sack M.D.   On: 09/18/2014 07:58   Ir Gj Tube Change  09/18/2014   INDICATION: Leaking gastrostomy tube  EXAM: FLUOROSCOPIC GUIDED REPLACEMENT/UPSIZING OF GASTROJEJUNOSTOMY TUBE  COMPARISON:  Fluoroscopic guided repositioning of gastrojejunostomy tube - 09/12/2014; fluoroscopic guided replacement of gastrojejunostomy tube - 01/22/2014  MEDICATIONS: None.  CONTRAST:  15 mL OMNIPAQUE IOHEXOL 300 MG/ML SOLN - administered into the gastric lumen as well as the proximal jejunum.  FLUOROSCOPY TIME:  54 seconds  COMPLICATIONS: None.  PROCEDURE: The upper abdomen and the external portion  of the existing gastrojejunostomy tube was prepped and draped in the usual sterile fashion, and a sterile drape was applied covering the operative field. Maximum barrier sterile technique with sterile gowns and gloves were used for the procedure. A timeout was performed prior to the initiation of the procedure.  The existing gastrojejunostomy tube was cut, dust deflating the retention gastrostomy balloon. The jejunal catheter was slightly retracted, cut and cannulated with a stiff Glidewire which was advanced through the jejunostomy lumen and coil within the proximal small bowel.  Under intermittent fluoroscopic guidance, a new, slightly larger 24 French balloon retention gastrojejunostomy tube was advanced over the stiff Glidewire with tip ultimately terminating within the proximal jejunum. The gastrostomy retention balloon was inflated with approximately 10 cc of saline and diet dilute contrast.  Contrast injection of the jejunal lumen demonstrates appropriate positioning within the proximal small bowel. Contrast injection of the gastrostomy lumen demonstrates appropriate positioning within the gastric lumen.  The procedure was terminated. Both lumens were flushed with saline. A dressing was placed. The patient tolerated the procedure well without immediate postprocedural complication.  IMPRESSION: Successful fluoroscopic guided replacement of a new slightly larger 24 French balloon retention gastrojejunostomy tube with tip of the jejunostomy lumen terminating within the proximal jejunum. Both lumens ready for immediate use.   Electronically Signed   By: Sandi Mariscal M.D.   On: 09/18/2014 11:49      MDM   Reviewed nursing notes and prior charts for additional history.   Reviewed records, pt during recent hospital stay had inadvertently pulled balloon into subcut tissue, then external to skin surface, and tube was subsequently replaced by IR.  Yesterday, due to leaking of gastric contents around tube, IR  replaced w slightly larger tube.  Pt notes today leaking around tube persists.  No abd pain. No fevers. Tolerating tube feeds.    There was fair amount of 'play' between balloon internally, and external phalange - cleaned tube/gastric contents from skin surface and phalanx w warm soap and water, dried area, and then gently, not tightly, snugged up phalange to see if that would improve situation.  Drain sponge, new dressing.   abd soft nt. Pt appears stable for d/c.  Discussed need to keep skin very clean/dry, which may involve changing dressing several times per day.  Also discussed that if draining around tube continues to be problem, or if new symptoms such as abd pain or fever, will need to return.      Mirna Mires, MD 09/19/14 [phone]

## 2014-09-19 NOTE — ED Notes (Signed)
Pt states he was seen yesterday to have his G tube manipulated and last night it began leaking during the night. There is leakage around the tube and onto the stomach. Pt denying pain at this time.

## 2014-09-22 ENCOUNTER — Emergency Department (HOSPITAL_COMMUNITY)
Admission: EM | Admit: 2014-09-22 | Discharge: 2014-09-22 | Disposition: A | Discharge status: Home / Self Care | Payer: PRIVATE HEALTH INSURANCE | Attending: Emergency Medicine | Admitting: Emergency Medicine

## 2014-09-22 DIAGNOSIS — Z7982 Long term (current) use of aspirin: Secondary | ICD-10-CM | POA: Diagnosis not present

## 2014-09-22 DIAGNOSIS — E119 Type 2 diabetes mellitus without complications: Secondary | ICD-10-CM | POA: Insufficient documentation

## 2014-09-22 DIAGNOSIS — K9423 Gastrostomy malfunction: Secondary | ICD-10-CM | POA: Diagnosis not present

## 2014-09-22 DIAGNOSIS — Z862 Personal history of diseases of the blood and blood-forming organs and certain disorders involving the immune mechanism: Secondary | ICD-10-CM | POA: Diagnosis not present

## 2014-09-22 DIAGNOSIS — Z8701 Personal history of pneumonia (recurrent): Secondary | ICD-10-CM | POA: Diagnosis not present

## 2014-09-22 DIAGNOSIS — IMO0002 Reserved for concepts with insufficient information to code with codable children: Secondary | ICD-10-CM | POA: Diagnosis not present

## 2014-09-22 DIAGNOSIS — J449 Chronic obstructive pulmonary disease, unspecified: Secondary | ICD-10-CM | POA: Diagnosis not present

## 2014-09-22 DIAGNOSIS — Z9889 Other specified postprocedural states: Secondary | ICD-10-CM | POA: Insufficient documentation

## 2014-09-22 DIAGNOSIS — Z87891 Personal history of nicotine dependence: Secondary | ICD-10-CM | POA: Insufficient documentation

## 2014-09-22 DIAGNOSIS — K9429 Other complications of gastrostomy: Secondary | ICD-10-CM

## 2014-09-22 DIAGNOSIS — I10 Essential (primary) hypertension: Secondary | ICD-10-CM | POA: Diagnosis not present

## 2014-09-22 DIAGNOSIS — Z79899 Other long term (current) drug therapy: Secondary | ICD-10-CM | POA: Diagnosis not present

## 2014-09-22 DIAGNOSIS — Z8505 Personal history of malignant neoplasm of liver: Secondary | ICD-10-CM | POA: Diagnosis not present

## 2014-09-22 DIAGNOSIS — J4489 Other specified chronic obstructive pulmonary disease: Secondary | ICD-10-CM | POA: Insufficient documentation

## 2014-09-22 MED ORDER — ACETAMINOPHEN 500 MG PO TABS
1000.0000 mg | ORAL_TABLET | Freq: Once | ORAL | Status: AC
  Administered 2014-09-22: 1000 mg via ORAL
  Filled 2014-09-22: qty 2

## 2014-09-22 NOTE — ED Notes (Signed)
Pt reports he had new feeding tube placed on Saturday, went back to [facility] on Monday to have tube adjusted. Now pt thinks tube is too small and has been leaking all night. Abdominal pain 8/10. Reports some SOB. Productive cough.

## 2014-09-22 NOTE — ED Notes (Signed)
EDP made aware pts heart rate drops in 30's for few seconds, EKG strip printed out for EDP, no changes in pt, no new orders at this time.

## 2014-09-22 NOTE — ED Provider Notes (Signed)
TIME SEEN: 8:40 AM  CHIEF COMPLAINT:  Leaking around feeding tube  HPI: Patient is a 80 year old male with history of diabetes, hypertension, hyperlipidemia, COPD, dysphagia status post GJ tube placement in 2013 who presents to the emergency department with gastric contents leaking from around his feeding tube. He states that he accidentally pulled his G-tube out and had it replaced by interventional radiology on 09/12/14. He was having some leaking of gastric contents around the feeding tube site and had it replaced with a larger 24 French tube on 09/18/14 again by interventional radiology. He reports that he feels that this area is too "tight" and he is still having gastric contents draining. He is having some skin irritation to this area. Otherwise denies abdominal pain, fevers, vomiting or diarrhea. Reports that he takes all of his medications and nutrition through his GJ tube.  ROS: See HPI Constitutional: no fever  Eyes: no drainage  ENT: no runny nose   Cardiovascular:  no chest pain  Resp: no SOB  GI: no vomiting GU: no dysuria Integumentary: no rash  Allergy: no hives  Musculoskeletal: no leg swelling  Neurological: no slurred speech ROS otherwise negative  PAST MEDICAL HISTORY/PAST SURGICAL HISTORY:  Past Medical History  Diagnosis Date  . DM2 (diabetes mellitus, type 2)   . Hyperlipidemia   . Hypertension   . Cerebral vascular disease   . Swan (hepatocellular carcinoma)     s/p right lobe resection 2012  . Blood dyscrasia     THROMBCYTOPATHIA  . Peripheral vascular disease     LICA stent [id number]  . Pneumonia 11/03/2012    aspiration pneumonias  . COPD (chronic obstructive pulmonary disease)     Gold C  . Thrombocytopenia   . Severe malnutrition   . Dysphagia     s/p PEG placement  . Paroxysmal atrial flutter     and fibrillation with known bradycardia, Chads2vasc of 4, not on A/C due to thrombocytopenia and functional status  . Dissecting aortic aneurysm, abdominal  11/13/2011    Overview:  Severe aortoiliac stenosis with possible a chronic dissection versus  ulcerated plaque/thrombus extending above and below the aortic  bifurcation; without aneurysmal dilation  as per CT abdomen noted 09/25/2011     MEDICATIONS:  Prior to Admission medications   Medication Sig Start Date End Date Taking? Authorizing Provider  albuterol (PROVENTIL) (2.5 MG/3ML) 0.083% nebulizer solution Take 3 mLs (2.5 mg total) by nebulization every 6 (six) hours as needed for wheezing or shortness of breath. 09/12/14   Oswald Hillock, MD  aspirin EC 81 MG tablet Take 81 mg by mouth daily.    Historical Provider, MD  levothyroxine (SYNTHROID, LEVOTHROID) 88 MCG tablet Take 88 mcg by mouth daily before breakfast.    Historical Provider, MD  linagliptin (TRADJENTA) 5 MG TABS tablet Take 5 mg by mouth daily.     Historical Provider, MD  mirtazapine (REMERON) 30 MG tablet Take 0.5 tablets (15 mg total) by mouth at bedtime. 09/12/14   Oswald Hillock, MD  mometasone-formoterol (DULERA) 100-5 MCG/ACT AERO Inhale 2 puffs into the lungs 2 (two) times daily. 09/12/14   Oswald Hillock, MD  Nutritional Supplements (FEEDING SUPPLEMENT, OSMOLITE 1.5 CAL,) LIQD Place 1,000 mLs into feeding tube continuous. 10/19/13   Nita Sells, MD  oxyCODONE (OXY IR/ROXICODONE) 5 MG immediate release tablet Take 15 mg by mouth every 4 (four) hours as needed for severe pain (pain in neck & shoulder).     Historical Provider, MD  tiotropium (  SPIRIVA) 18 MCG inhalation capsule Place 18 mcg into inhaler and inhale 2 (two) times daily.    Historical Provider, MD  Water For Irrigation, Sterile (FREE WATER) SOLN Place 200 mLs into feeding tube every 8 (eight) hours. 10/19/13   Nita Sells, MD    ALLERGIES:  No Known Allergies  SOCIAL HISTORY:  History  Substance Use Topics  . Smoking status: Former Smoker -- 1.00 packs/day for 50 years    Types: Cigarettes    Quit date: 01/01/2012  . Smokeless tobacco: Never  Used  . Alcohol Use: Yes     Comment: 3 beers per week    FAMILY HISTORY: Family History  Problem Relation Age of Onset  . CAD Neg Hx   . Cancer Father     throat, died young  . Liver disease Brother     EXAM: BP 143/74  Pulse 89  Temp(Src) 97.5 F (36.4 C) (Axillary)  SpO2 99% CONSTITUTIONAL: Alert and oriented and responds appropriately to questions. Well-appearing; well-nourished, elderly, appears chronically ill, in no distress HEAD: Normocephalic EYES: Conjunctivae clear, PERRL ENT: normal nose; no rhinorrhea; moist mucous membranes; pharynx without lesions noted NECK: Supple, no meningismus, no LAD  CARD: RRR; S1 and S2 appreciated; no murmurs, no clicks, no rubs, no gallops RESP: Normal chest excursion without splinting or tachypnea; breath sounds clear and equal bilaterally; no wheezes, no rhonchi, no rales,  ABD/GI: Normal bowel sounds; non-distended; soft, non-tender, no rebound, no guarding; G./J-tube present in the left abdomen with surrounding erythema and mild skin breakdown immediately around the site with yellow appearing gastric contents of on the gauze and on his skin and down his pants BACK:  The back appears normal and is non-tender to palpation, there is no CVA tenderness EXT: Normal ROM in all joints; non-tender to palpation; no edema; normal capillary refill; no cyanosis    SKIN: Normal color for age and race; warm NEURO: Moves all extremities equally PSYCH: The patient's mood and manner are appropriate. Grooming and personal hygiene are appropriate.  MEDICAL DECISION MAKING: Patient here with leaking around his GJ tube site. He does have some skin irritation but otherwise his abdominal exam is benign. No systemic symptoms. He feels that his GJ tube needs to be replaced again. When I am manipulating the [place] tube, patient does have some pain in this area. He does not feel loose at all but there is significant amount of active gastric drainage. No bleeding. No  purulent. We'll discuss with interventional radiology for further recommendations. Discussed with patient that this procedure may not be done emergently today but may be scheduled as an outpatient. We'll give Tylenol for the pain that he is experiencing from irritation of the skin around the site.  ED PROGRESS: Discussed with interventional radiology PA who will see the patient in the ED for recommendations.   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 Reviewed monitor recording. Patient is in atrial fibrillation and I do not feel the mother is picking up his heart rate correctly. Denies any chest pain, shortness of breath or dizziness. We'll continue to keep on monitor. EKG shows no new ischemic changes. Atrial fibrillation is his baseline.  11:10 AM  Pt has been seen by IR PA he does not recommend replacing his tube at this time. Discussed with patient that continually repeating is to have a larger tube will increase the size of the hole  and increase drainage. He has replaced dressing. Has recommended using A&D ointment over-the-counter as a. Her cream on his irritated skin. We'll have him use Tylenol for pain control. He has discussed with patient that if he has concerns he may come directly to the radiology department for evaluation instead of coming to the ED. Discussed return precautions. Patient verbalizes understanding and is comfortable with plan.   EKG Interpretation  Date/Time:  Wednesday September 22 2014 08:26:13 EDT Ventricular Rate:  76 PR Interval:    QRS Duration: 123 QT Interval:  419 QTC Calculation: 471 R Axis:   -62 Text Interpretation:  Atrial fibrillation Nonspecific IVCD with LAD LVH with secondary repolarization abnormality Anterior infarct, old No significant change since last tracing Confirmed by WARD,  DO, KRISTEN ([id number]) on 09/22/2014 9:43:33 AM           [place],  DO 09/22/14 1117

## 2014-09-22 NOTE — Discharge Instructions (Signed)
You may use Tylenol 1000 mg every 6 hours as needed for pain. Advised pt to use A&D ointment (over the counter) or 'Boudreaux's Butt Paste' to the skin around the site as a protective barrier and to keep clean, dry single layer gauze or drain sponge under the bumper at all times.  Would not recommend another upsize just yet.  If he continues to leak, we will need to get him seen by a Wound/Ostomy Nurse (can be set up by your PCP) for more recommendations.  He can contact our radiology department at [phone] with further issues and we are happy to see him directly instead of him having to come through the ER.    Gastrostomy Tube Home Guide A gastrostomy tube is a tube that is surgically placed into the stomach. It is also called a "G-tube." G-tubes are used when a person is unable to eat and drink enough on their own to stay healthy. The tube is inserted into the stomach through a small cut (incision) in the skin. This tube is used for:  Feeding.  Giving medication. GASTROSTOMY TUBE CARE  Wash your hands with soap and water.  Remove the old dressing (if any). Some styles of G-tubes may need a dressing inserted between the skin and the G-tube. Other types of G-tubes do not require a dressing. Ask your health care provider if a dressing is needed.  Check the area where the tube enters the skin (insertion site) for redness, swelling, or pus-like (purulent) drainage. A small amount of clear or tan liquid drainage is normal. Check to make sure scar tissue (skin) is not growing around the insertion site. This could have a raised, bumpy appearance.  A cotton swab can be used to clean the skin around the tube:  When the G-tube is first put in, a normal saline solution or water can be used to clean the skin.  Mild soap and warm water can be used when the skin around the G-tube site has healed.  Roll the cotton swab around the G-tube insertion site to remove any drainage or crusting at the insertion  site. STOMACH RESIDUALS Feeding tube residuals are the amount of liquids that are in the stomach at any given time. Residuals may be checked before giving feedings, medications, or as instructed by your health care provider.  Ask your health care provider if there are instances when you would not start tube feedings depending on the amount or type of contents withdrawn from the stomach.  Check residuals by attaching a syringe to the G-tube and pulling back on the syringe plunger. Note the amount, and return the residual back into the stomach. FLUSHING THE G-TUBE  The G-tube should be periodically flushed with clean warm water to keep it from clogging.  Flush the G-tube after feedings or medications. Draw up 30 mL of warm water in a syringe. Connect the syringe to the G-tube and slowly push the water into the tube.  Do not push feedings, medications, or flushes rapidly. Flush the G-tube gently and slowly.  Only use syringes made for G-tubes to flush medications or feedings.  Your health care provider may want the G-tube flushed more often or with more water. If this is the case, follow your health care provider's instructions. FEEDINGS Your health care provider will determine whether feedings are given as a bolus (a certain amount given at one time and at scheduled times) or whether feedings will be given continuously on a feeding pump.   Formulas  should be given at room temperature.  If feedings are continuous, no more than 4 hours worth of feedings should be placed in the feeding bag. This helps prevent spoilage or accidental excess infusion.  Cover and place unused formula in the refrigerator.  If feedings are continuous, stop the feedings when medications or flushes are given. Be sure to restart the feedings.  Feeding bags and syringes should be replaced as instructed by your health care provider. GIVING MEDICATION   In general, it is best if all medications are in a liquid form  for G-tube administration. Liquid medications are less likely to clog the G-tube.  Mix the liquid medication with 30 mL (or amount recommended by your health care provider) of warm water.  Draw up the medication into the syringe.  Attach the syringe to the G-tube and slowly push the mixture into the G-tube.  After giving the medication, draw up 30 mL of warm water in the syringe and slowly flush the G-tube.  For pills or capsules, check with your health care provider first before crushing medications. Some pills are not effective if they are crushed. Some capsules are sustained-release medications.  If appropriate, crush the pill or capsule and mix with 30 mL of warm water. Using the syringe, slowly push the medication through the tube, then flush the tube with another 30 mL of tap water. G-TUBE PROBLEMS G-tube was pulled out.  Cause: May have been pulled out accidentally.  Solutions: Cover the opening with clean dressing and tape. Call your health care provider right away. The G-tube should be put in as soon as possible (within 4 hours) so the G-tube opening (tract) does not close. The G-tube needs to be put in at a health care setting. An X-ray needs to be done to confirm placement before the G-tube can be used again. Redness, irritation, soreness, or foul odor around the gastrostomy site.  Cause: May be caused by leakage or infection.  Solutions: Call your health care provider right away. Large amount of leakage of fluid or mucus-like liquid present (a large amount means it soaks clothing).  Cause: Many reasons could cause the G-tube to leak.  Solutions: Call your health care provider to discuss the amount of leakage. Skin or scar tissue appears to be growing where tube enters skin.   Cause: Tissue growth may develop around the insertion site if the G-tube is moved or pulled on excessively.  Solutions: Secure tube with tape so that excess movement does not occur. Call your health  care provider. G-tube is clogged.  Cause: Thick formula or medication.  Solutions: Try to slowly push warm water into the tube with a large syringe. Never try to push any object into the tube to unclog it. Do not force fluid into the G-tube. If you are unable to unclog the tube, call your health care provider right away. TIPS  Head of bed (HOB) position refers to the upright position of a person's upper body.  When giving medications or a feeding bolus, keep the [facility] up as told by your health care provider. Do this during the feeding and for 1 hour after the feeding or medication administration.  If continuous feedings are being given, it is best to keep the [facility] up as told by your health care provider. When ADLs (activities of daily living) are performed and the [facility] needs to be flat, be sure to turn the feeding pump off. Restart the feeding pump when the [facility] is returned to the  recommended height.  Do not pull or put tension on the tube.  To prevent fluid backflow, kink the G-tube before removing the cap or disconnecting a syringe.  Check the G-tube length every day. Measure from the insertion site to the end of the G-tube. If the length is longer than previous measurements, the tube may be coming out. Call your health care provider if you notice increasing G-tube length.  Oral care, such as brushing teeth, must be continued.  You may need to remove excess air (vent) from the G-tube. Your health care provider will tell you if this is needed.  Always call your health care provider if you have questions or problems with the G-tube. SEEK IMMEDIATE MEDICAL CARE IF:   You have severe abdominal pain, tenderness, or abdominal bloating (distension).  You have nausea or vomiting.  You are constipated or have problems moving your bowels.  The G-tube insertion site is red, swollen, has a foul smell, or has yellow or brown drainage.  You have difficulty breathing or shortness of breath.  You  have a fever.  You have a large amount of feeding tube residuals.  The G-tube is clogged and cannot be flushed. MAKE SURE YOU:   Understand these instructions.  Will watch your condition.  Will get help right away if you are not doing well or get worse. Document Released: 02/25/2002 Document Revised: 05/03/2014 Document Reviewed: 08/24/2013 [facility] Patient Information 2015 [place], [place]. This information is not intended to replace advice given to you by your health care provider. Make sure you discuss any questions you have with your health care provider.

## 2014-09-22 NOTE — ED Notes (Signed)
Pt states has had a feeding tube x 2 years, has had problems in the past w/ his tube and changed multiple times, states accidentally pulled out Saturday, had to have it replaced, then Monday had it tightened, since then has had problems with it leaking and discomfort where tube is. Around pts tube area is red and raw looking, yellowish drainage coming from around tube area. Pt also states has had a cough which is not new, states since he's had swallowing problems x 5 years, he states his coughing is probably why he has problems with his tube.

## 2014-09-22 NOTE — Consult Note (Signed)
IR called to eval G-J tube for leaking and skin irritation. Pt has had G-J tube for several years and has had numerous exchanges and has gradually been upsized. Most recent exchange/upsize was just a few days ago on 9/19 He now has a 24 Fr tube in.  He has had continued leakage around the tube which is causing skin irritation.  At the site, there is mild/moderate erythema secondary to chronic moisture leakage. The skin tract has some erosion but there isn't any significant laxity in the tube. The balloon is appropriately inflated with ~8cc saline. The bumper is secure.  Advised pt to use A&D ointment or 'Boudreaux's Butt Paste' to the skin around the site as a protective barrier and to keep clean, dry single layer gauze or drain sponge under the bumper at all times.  Would not recommend another upsize just yet. If he continues to leak, we will need to get him seen by a Wound/Ostomy RN for more recommendations.  He can contact our dept at [phone] with further issues and we are happy to see him directly instead of him having to come through the ER.  Ascencion Dike PA-C Interventional Radiology 09/22/2014 10:07 AM

## 2014-09-24 ENCOUNTER — Ambulatory Visit (HOSPITAL_COMMUNITY)
Admission: RE | Admit: 2014-09-24 | Discharge: 2014-09-24 | Disposition: A | Discharge status: Home / Self Care | Payer: PRIVATE HEALTH INSURANCE | Source: Ambulatory Visit | Attending: Radiology | Admitting: Radiology

## 2014-09-24 ENCOUNTER — Other Ambulatory Visit (HOSPITAL_COMMUNITY): Payer: Self-pay | Admitting: Radiology

## 2014-09-24 DIAGNOSIS — Z789 Other specified health status: Secondary | ICD-10-CM

## 2014-11-16 ENCOUNTER — Other Ambulatory Visit (HOSPITAL_COMMUNITY): Payer: Self-pay | Admitting: Interventional Radiology

## 2014-11-16 DIAGNOSIS — R633 Feeding difficulties, unspecified: Secondary | ICD-10-CM

## 2014-11-17 ENCOUNTER — Ambulatory Visit (HOSPITAL_COMMUNITY)
Admission: RE | Admit: 2014-11-17 | Discharge: 2014-11-17 | Disposition: A | Discharge status: Home / Self Care | Payer: PRIVATE HEALTH INSURANCE | Source: Ambulatory Visit | Attending: Interventional Radiology | Admitting: Interventional Radiology

## 2014-11-17 ENCOUNTER — Other Ambulatory Visit (HOSPITAL_COMMUNITY): Payer: Self-pay | Admitting: Interventional Radiology

## 2014-11-17 DIAGNOSIS — R1314 Dysphagia, pharyngoesophageal phase: Secondary | ICD-10-CM | POA: Insufficient documentation

## 2014-11-17 DIAGNOSIS — R633 Feeding difficulties, unspecified: Secondary | ICD-10-CM

## 2014-11-17 DIAGNOSIS — K9423 Gastrostomy malfunction: Secondary | ICD-10-CM | POA: Diagnosis present

## 2014-11-17 DIAGNOSIS — Y833 Surgical operation with formation of external stoma as the cause of abnormal reaction of the patient, or of later complication, without mention of misadventure at the time of the procedure: Secondary | ICD-10-CM | POA: Insufficient documentation

## 2014-11-17 MED ORDER — LIDOCAINE VISCOUS 2 % MT SOLN
OROMUCOSAL | Status: AC
  Filled 2014-11-17: qty 15

## 2014-11-17 MED ORDER — IOHEXOL 300 MG/ML  SOLN
10.0000 mL | Freq: Once | INTRAMUSCULAR | Status: AC | PRN
  Administered 2014-11-17: 10 mL

## 2014-11-17 MED ORDER — LIDOCAINE-EPINEPHRINE 2 %-1:100000 IJ SOLN
INTRAMUSCULAR | Status: AC
  Filled 2014-11-17: qty 1

## 2014-11-17 NOTE — Procedures (Signed)
Successful fluoroscopic guided replacement of a 22 Fr gastrojejunostomy tube.  No immediate post procedural complications.  The feeding tube is ready for immediate use.

## 2014-11-24 ENCOUNTER — Encounter (HOSPITAL_COMMUNITY): Payer: Self-pay | Admitting: *Deleted

## 2014-12-02 ENCOUNTER — Other Ambulatory Visit (HOSPITAL_COMMUNITY): Payer: Self-pay | Admitting: Diagnostic Radiology

## 2014-12-02 ENCOUNTER — Ambulatory Visit (HOSPITAL_COMMUNITY)
Admission: RE | Admit: 2014-12-02 | Discharge: 2014-12-02 | Disposition: A | Discharge status: Home / Self Care | Payer: PRIVATE HEALTH INSURANCE | Source: Ambulatory Visit | Attending: Diagnostic Radiology | Admitting: Diagnostic Radiology

## 2014-12-02 DIAGNOSIS — R633 Feeding difficulties, unspecified: Secondary | ICD-10-CM

## 2014-12-02 MED ORDER — LIDOCAINE VISCOUS 2 % MT SOLN: 15.0000 mL | Freq: Once | OROMUCOSAL | Status: DC

## 2014-12-02 MED ORDER — IOHEXOL 300 MG/ML  SOLN
20.0000 mL | Freq: Once | INTRAMUSCULAR | Status: AC | PRN
  Administered 2014-12-02: 15 mL

## 2014-12-02 MED ORDER — LIDOCAINE VISCOUS 2 % MT SOLN
OROMUCOSAL | Status: AC
  Filled 2014-12-02: qty 15

## 2014-12-14 ENCOUNTER — Ambulatory Visit (HOSPITAL_COMMUNITY)
Admission: RE | Admit: 2014-12-14 | Discharge: 2014-12-14 | Disposition: A | Discharge status: Home / Self Care | Payer: PRIVATE HEALTH INSURANCE | Source: Ambulatory Visit | Attending: Cardiovascular Disease | Admitting: Cardiovascular Disease

## 2014-12-14 DIAGNOSIS — I6523 Occlusion and stenosis of bilateral carotid arteries: Secondary | ICD-10-CM | POA: Diagnosis not present

## 2014-12-14 DIAGNOSIS — I6529 Occlusion and stenosis of unspecified carotid artery: Secondary | ICD-10-CM

## 2014-12-14 NOTE — Progress Notes (Signed)
Carotid Duplex Completed. °Brianna L Mazza,RVT °

## 2014-12-22 ENCOUNTER — Encounter: Payer: Self-pay | Admitting: *Deleted

## 2015-01-04 DIAGNOSIS — E039 Hypothyroidism, unspecified: Secondary | ICD-10-CM | POA: Diagnosis not present

## 2015-01-04 DIAGNOSIS — N182 Chronic kidney disease, stage 2 (mild): Secondary | ICD-10-CM | POA: Diagnosis not present

## 2015-01-04 DIAGNOSIS — E1121 Type 2 diabetes mellitus with diabetic nephropathy: Secondary | ICD-10-CM | POA: Diagnosis not present

## 2015-01-04 DIAGNOSIS — I10 Essential (primary) hypertension: Secondary | ICD-10-CM | POA: Diagnosis not present

## 2015-01-17 DIAGNOSIS — E860 Dehydration: Secondary | ICD-10-CM | POA: Diagnosis not present

## 2015-01-17 DIAGNOSIS — R1312 Dysphagia, oropharyngeal phase: Secondary | ICD-10-CM | POA: Diagnosis not present

## 2015-01-17 DIAGNOSIS — J690 Pneumonitis due to inhalation of food and vomit: Secondary | ICD-10-CM | POA: Diagnosis not present

## 2015-01-17 DIAGNOSIS — J449 Chronic obstructive pulmonary disease, unspecified: Secondary | ICD-10-CM | POA: Diagnosis not present

## 2015-01-25 DIAGNOSIS — Z8505 Personal history of malignant neoplasm of liver: Secondary | ICD-10-CM | POA: Diagnosis not present

## 2015-01-25 DIAGNOSIS — Z434 Encounter for attention to other artificial openings of digestive tract: Secondary | ICD-10-CM | POA: Diagnosis not present

## 2015-01-25 DIAGNOSIS — Z431 Encounter for attention to gastrostomy: Secondary | ICD-10-CM | POA: Diagnosis not present

## 2015-01-25 DIAGNOSIS — E119 Type 2 diabetes mellitus without complications: Secondary | ICD-10-CM | POA: Diagnosis not present

## 2015-01-25 DIAGNOSIS — Z4659 Encounter for fitting and adjustment of other gastrointestinal appliance and device: Secondary | ICD-10-CM | POA: Diagnosis not present

## 2015-01-27 DIAGNOSIS — K9423 Gastrostomy malfunction: Secondary | ICD-10-CM | POA: Diagnosis not present

## 2015-01-27 DIAGNOSIS — E119 Type 2 diabetes mellitus without complications: Secondary | ICD-10-CM | POA: Diagnosis not present

## 2015-01-27 DIAGNOSIS — Z79899 Other long term (current) drug therapy: Secondary | ICD-10-CM | POA: Diagnosis not present

## 2015-01-27 DIAGNOSIS — Z5181 Encounter for therapeutic drug level monitoring: Secondary | ICD-10-CM | POA: Diagnosis not present

## 2015-01-27 DIAGNOSIS — Z431 Encounter for attention to gastrostomy: Secondary | ICD-10-CM | POA: Diagnosis not present

## 2015-01-30 ENCOUNTER — Encounter (HOSPITAL_COMMUNITY): Payer: Self-pay | Admitting: Emergency Medicine

## 2015-01-30 ENCOUNTER — Emergency Department (HOSPITAL_COMMUNITY)
Admission: EM | Admit: 2015-01-30 | Discharge: 2015-01-30 | Disposition: A | Discharge status: Home / Self Care | Payer: Medicare Other | Attending: Emergency Medicine | Admitting: Emergency Medicine

## 2015-01-30 DIAGNOSIS — Z8701 Personal history of pneumonia (recurrent): Secondary | ICD-10-CM | POA: Diagnosis not present

## 2015-01-30 DIAGNOSIS — Z79899 Other long term (current) drug therapy: Secondary | ICD-10-CM | POA: Insufficient documentation

## 2015-01-30 DIAGNOSIS — T85598A Other mechanical complication of other gastrointestinal prosthetic devices, implants and grafts, initial encounter: Secondary | ICD-10-CM | POA: Diagnosis not present

## 2015-01-30 DIAGNOSIS — Z8505 Personal history of malignant neoplasm of liver: Secondary | ICD-10-CM | POA: Diagnosis not present

## 2015-01-30 DIAGNOSIS — Z7982 Long term (current) use of aspirin: Secondary | ICD-10-CM | POA: Insufficient documentation

## 2015-01-30 DIAGNOSIS — Z7951 Long term (current) use of inhaled steroids: Secondary | ICD-10-CM | POA: Diagnosis not present

## 2015-01-30 DIAGNOSIS — J449 Chronic obstructive pulmonary disease, unspecified: Secondary | ICD-10-CM | POA: Insufficient documentation

## 2015-01-30 DIAGNOSIS — I10 Essential (primary) hypertension: Secondary | ICD-10-CM | POA: Diagnosis not present

## 2015-01-30 DIAGNOSIS — Z9889 Other specified postprocedural states: Secondary | ICD-10-CM | POA: Diagnosis not present

## 2015-01-30 DIAGNOSIS — Z87891 Personal history of nicotine dependence: Secondary | ICD-10-CM | POA: Diagnosis not present

## 2015-01-30 DIAGNOSIS — K9429 Other complications of gastrostomy: Secondary | ICD-10-CM | POA: Diagnosis not present

## 2015-01-30 DIAGNOSIS — E119 Type 2 diabetes mellitus without complications: Secondary | ICD-10-CM | POA: Insufficient documentation

## 2015-01-30 DIAGNOSIS — Z862 Personal history of diseases of the blood and blood-forming organs and certain disorders involving the immune mechanism: Secondary | ICD-10-CM | POA: Insufficient documentation

## 2015-01-30 DIAGNOSIS — K9420 Gastrostomy complication, unspecified: Secondary | ICD-10-CM

## 2015-01-30 DIAGNOSIS — K9423 Gastrostomy malfunction: Secondary | ICD-10-CM

## 2015-01-30 MED ORDER — BACITRACIN ZINC 500 UNIT/GM EX OINT
TOPICAL_OINTMENT | Freq: Two times a day (BID) | CUTANEOUS | Status: DC
  Administered 2015-01-30: 1 via TOPICAL

## 2015-01-30 NOTE — ED Notes (Signed)
Per pt states he had an appointment at [facility] last Tuesday and when he was getting out of car his tube got caught on seat belt-came all the way out-had it replaced at that time-

## 2015-01-30 NOTE — ED Provider Notes (Signed)
CSN: [id number]     Arrival date & time 01/30/15  1058 History   First MD Initiated Contact with Patient 01/30/15 1204     Chief Complaint  Patient presents with  . feeding tube leaking      (Consider location/radiation/quality/duration/timing/severity/associated sxs/prior Treatment) HPI Patient reports he tripped while getting out of his daughter's car 5 days ago pulling out his feeding tube causing bleeding at the ostomy site. He had his feeding tube exchanged at [facility] in the radiology department 5 days ago. Since he's had his feeding tube replaced he's noticed leakage around the tube at the ostomy the site each time he eats or drinks. Skin burns at the site. No other associated symptoms. No treatment prior to coming here. He comes requesting changing of his gastrojejunostomy tube Past Medical History  Diagnosis Date  . DM2 (diabetes mellitus, type 2)   . Hyperlipidemia   . Hypertension   . Cerebral vascular disease   . [place] (hepatocellular carcinoma)     s/p right lobe resection 2012  . Blood dyscrasia     THROMBCYTOPATHIA  . Peripheral vascular disease     LICA stent [id number]  . Pneumonia 11/03/2012    aspiration pneumonias  . COPD (chronic obstructive pulmonary disease)     Gold C  . Thrombocytopenia   . Severe malnutrition   . Dysphagia     s/p PEG placement  . Paroxysmal atrial flutter     and fibrillation with known bradycardia, Chads2vasc of 4, not on A/C due to thrombocytopenia and functional status  . Dissecting aortic aneurysm, abdominal 11/13/2011    Overview:  Severe aortoiliac stenosis with possible a chronic dissection versus  ulcerated plaque/thrombus extending above and below the aortic  bifurcation; without aneurysmal dilation  as per CT abdomen noted 09/25/2011    Past Surgical History  Procedure Laterality Date  . Carotid stent  2002  . Liver canc    . Posterior cervical fusion/foraminotomy  09/25/2012    Procedure: POSTERIOR CERVICAL  FUSION/FORAMINOTOMY LEVEL 2;  Surgeon: Eustace Moore, MD;  Location: [place] [facility];  Service: Neurosurgery;  Laterality: N/A;  Posterior Cervical three-four laminectomy, posterior cervical three-four, four-five fusion  . Esophagogastroduodenoscopy  10/01/2012    Procedure: ESOPHAGOGASTRODUODENOSCOPY (EGD);  Surgeon: Gwenyth Ober, MD;  Location: [place];  Service: General;  Laterality: N/A;  wyatt/leone  . Peg placement  10/01/2012    Procedure: PERCUTANEOUS ENDOSCOPIC GASTROSTOMY (PEG) PLACEMENT;  Surgeon: Gwenyth Ober, MD;  Location: [place];  Service: General;  Laterality: N/A;  . Esophagogastroduodenoscopy  10/31/2012    Procedure: ESOPHAGOGASTRODUODENOSCOPY (EGD);  Surgeon: Beryle Beams, MD;  Location: [facility];  Service: Endoscopy;  Laterality: N/A;   Family History  Problem Relation Age of Onset  . CAD Neg Hx   . Cancer Father     throat, died young  . Liver disease Brother    History  Substance Use Topics  . Smoking status: Former Smoker -- 1.00 packs/day for 50 years    Types: Cigarettes    Quit date: 01/01/2012  . Smokeless tobacco: Never Used  . Alcohol Use: Yes     Comment: 3 beers per week    Review of Systems  Constitutional: Negative.   HENT: Negative.   Respiratory: Negative.   Cardiovascular: Negative.   Gastrointestinal: Negative.        Chronic dysphagia  Musculoskeletal: Negative.   Skin: Positive for wound.       Reddened skin around ostomy site at  abdomen  Neurological: Negative.   Psychiatric/Behavioral: Negative.   All other systems reviewed and are negative.     Allergies  Review of patient's allergies indicates no known allergies.  Home Medications   Prior to Admission medications   Medication Sig Start Date End Date Taking? Authorizing Provider  albuterol (PROVENTIL) (2.5 MG/3ML) 0.083% nebulizer solution Take 3 mLs (2.5 mg total) by nebulization every 6 (six) hours as needed for wheezing or shortness of breath. 09/12/14   Oswald Hillock, MD  aspirin EC 81 MG tablet Take 81 mg by mouth daily.    Historical Provider, MD  levothyroxine (SYNTHROID, LEVOTHROID) 88 MCG tablet Take 88 mcg by mouth daily before breakfast.    Historical Provider, MD  linagliptin (TRADJENTA) 5 MG TABS tablet Take 5 mg by mouth daily.     Historical Provider, MD  mirtazapine (REMERON) 30 MG tablet Take 0.5 tablets (15 mg total) by mouth at bedtime. 09/12/14   Oswald Hillock, MD  mometasone-formoterol (DULERA) 100-5 MCG/ACT AERO Inhale 2 puffs into the lungs 2 (two) times daily. 09/12/14   Oswald Hillock, MD  Nutritional Supplements (FEEDING SUPPLEMENT, OSMOLITE 1.5 CAL,) LIQD Place 1,000 mLs into feeding tube continuous. 10/19/13   Nita Sells, MD  oxyCODONE (OXY IR/ROXICODONE) 5 MG immediate release tablet Take 15 mg by mouth every 4 (four) hours as needed for severe pain (pain in neck & shoulder).     Historical Provider, MD  tiotropium (SPIRIVA) 18 MCG inhalation capsule Place 18 mcg into inhaler and inhale 2 (two) times daily.    Historical Provider, MD  Water For Irrigation, Sterile (FREE WATER) SOLN Place 200 mLs into feeding tube every 8 (eight) hours. 10/19/13   Nita Sells, MD   BP 123/58 mmHg  Pulse 71  Temp(Src) 98 F (36.7 C) (Oral)  Resp 16  SpO2 98% Physical Exam  Constitutional: No distress.  Cachectic chronically ill-appearing  HENT:  Head: Normocephalic and atraumatic.  Eyes: Conjunctivae are normal. Pupils are equal, round, and reactive to light.  Neck: Neck supple. No tracheal deviation present. No thyromegaly present.  Cardiovascular: Normal rate.   Pulmonary/Chest: Effort normal and breath sounds normal.  Abdominal: Soft. Bowel sounds are normal. He exhibits no distension. There is no tenderness.  Feeding tube at site at left mid abdomen with yellow appearing liquid in the tube. Skin around the ostomy site mildly reddened.  Musculoskeletal: Normal range of motion. He exhibits no edema or tenderness.   Neurological: He is alert. Coordination normal.  Skin: Skin is warm and dry. No rash noted.  Psychiatric: He has a normal mood and affect.  Nursing note and vitals reviewed.   ED Course  Procedures (including critical care time) Labs Review Labs Reviewed - No data to display  Imaging Review No results found.   EKG Interpretation None       MDM Final diagnoses:  None    spoke with Dr. Vernard Gambles from interventional radiology plan patient to call radiology suite tomorrow to arrange to have feeding tube placed  Diagnosis complication of feeding tube    Orlie Dakin, MD 01/30/15 1257

## 2015-01-30 NOTE — Discharge Instructions (Signed)
Call [facility] interventional radiology tomorrow morning at 8 AM at [phone]. The office will give you a time to appear at the radiology department at [facility] to have your feeding tube exchanged

## 2015-01-31 ENCOUNTER — Other Ambulatory Visit (HOSPITAL_COMMUNITY): Payer: Self-pay | Admitting: Interventional Radiology

## 2015-01-31 ENCOUNTER — Ambulatory Visit (HOSPITAL_COMMUNITY)
Admission: RE | Admit: 2015-01-31 | Discharge: 2015-01-31 | Disposition: A | Discharge status: Home / Self Care | Payer: Medicare Other | Source: Ambulatory Visit | Attending: Interventional Radiology | Admitting: Interventional Radiology

## 2015-01-31 DIAGNOSIS — R633 Feeding difficulties, unspecified: Secondary | ICD-10-CM

## 2015-01-31 DIAGNOSIS — Z431 Encounter for attention to gastrostomy: Secondary | ICD-10-CM | POA: Insufficient documentation

## 2015-01-31 DIAGNOSIS — T85598A Other mechanical complication of other gastrointestinal prosthetic devices, implants and grafts, initial encounter: Secondary | ICD-10-CM | POA: Diagnosis not present

## 2015-01-31 MED ORDER — LIDOCAINE VISCOUS 2 % MT SOLN
OROMUCOSAL | Status: AC
  Filled 2015-01-31: qty 15

## 2015-01-31 MED ORDER — IOHEXOL 300 MG/ML  SOLN
10.0000 mL | Freq: Once | INTRAMUSCULAR | Status: AC | PRN
  Administered 2015-01-31: 10 mL

## 2015-01-31 MED ORDER — LIDOCAINE VISCOUS 2 % MT SOLN
15.0000 mL | Freq: Once | OROMUCOSAL | Status: AC
  Administered 2015-01-31: 15 mL via OROMUCOSAL

## 2015-01-31 NOTE — Procedures (Signed)
Procedure:  Exchange of perc GJ tube Findings:  New 24 Fr balloon retention GJ tube placed over wire.  Distal lumen tip in jejunum.  OK to use.

## 2015-02-03 ENCOUNTER — Other Ambulatory Visit (HOSPITAL_COMMUNITY): Payer: Self-pay | Admitting: Diagnostic Radiology

## 2015-02-03 ENCOUNTER — Ambulatory Visit (HOSPITAL_COMMUNITY)
Admission: RE | Admit: 2015-02-03 | Discharge: 2015-02-03 | Disposition: A | Discharge status: Home / Self Care | Payer: Medicare Other | Source: Ambulatory Visit | Attending: Diagnostic Radiology | Admitting: Diagnostic Radiology

## 2015-02-03 DIAGNOSIS — R1310 Dysphagia, unspecified: Secondary | ICD-10-CM

## 2015-02-03 DIAGNOSIS — K9429 Other complications of gastrostomy: Secondary | ICD-10-CM | POA: Diagnosis not present

## 2015-02-03 DIAGNOSIS — T85598D Other mechanical complication of other gastrointestinal prosthetic devices, implants and grafts, subsequent encounter: Secondary | ICD-10-CM | POA: Diagnosis not present

## 2015-02-03 DIAGNOSIS — R4702 Dysphasia: Secondary | ICD-10-CM | POA: Diagnosis not present

## 2015-02-03 DIAGNOSIS — Y848 Other medical procedures as the cause of abnormal reaction of the patient, or of later complication, without mention of misadventure at the time of the procedure: Secondary | ICD-10-CM | POA: Diagnosis not present

## 2015-02-03 MED ORDER — LIDOCAINE VISCOUS 2 % MT SOLN
OROMUCOSAL | Status: AC
  Filled 2015-02-03: qty 15

## 2015-02-03 MED ORDER — IOHEXOL 300 MG/ML  SOLN
50.0000 mL | Freq: Once | INTRAMUSCULAR | Status: AC | PRN
  Administered 2015-02-03: 10 mL via INTRAVENOUS

## 2015-02-03 NOTE — Care Management (Signed)
Received a call from [facility] in IR to set up a [facility] for Mr Hennon for assistance in the management of his PEG tube. I spoke with Mr Wittwer to offer choice. He advised that he had used [facility] in the past and was comfortable using them again. Referral called to Katie at [phone]. I also faxed the order to [facility] at [phone].

## 2015-02-08 DIAGNOSIS — I10 Essential (primary) hypertension: Secondary | ICD-10-CM | POA: Diagnosis not present

## 2015-02-08 DIAGNOSIS — I081 Rheumatic disorders of both mitral and tricuspid valves: Secondary | ICD-10-CM | POA: Diagnosis not present

## 2015-02-08 DIAGNOSIS — J449 Chronic obstructive pulmonary disease, unspecified: Secondary | ICD-10-CM | POA: Diagnosis not present

## 2015-02-08 DIAGNOSIS — E46 Unspecified protein-calorie malnutrition: Secondary | ICD-10-CM | POA: Diagnosis not present

## 2015-02-08 DIAGNOSIS — I340 Nonrheumatic mitral (valve) insufficiency: Secondary | ICD-10-CM | POA: Diagnosis not present

## 2015-02-08 DIAGNOSIS — E785 Hyperlipidemia, unspecified: Secondary | ICD-10-CM | POA: Diagnosis not present

## 2015-02-08 DIAGNOSIS — R0602 Shortness of breath: Secondary | ICD-10-CM | POA: Diagnosis not present

## 2015-02-16 DIAGNOSIS — J449 Chronic obstructive pulmonary disease, unspecified: Secondary | ICD-10-CM | POA: Diagnosis not present

## 2015-02-16 DIAGNOSIS — R1312 Dysphagia, oropharyngeal phase: Secondary | ICD-10-CM | POA: Diagnosis not present

## 2015-02-16 DIAGNOSIS — J690 Pneumonitis due to inhalation of food and vomit: Secondary | ICD-10-CM | POA: Diagnosis not present

## 2015-02-16 DIAGNOSIS — E860 Dehydration: Secondary | ICD-10-CM | POA: Diagnosis not present

## 2015-02-17 DIAGNOSIS — I10 Essential (primary) hypertension: Secondary | ICD-10-CM | POA: Diagnosis not present

## 2015-02-17 DIAGNOSIS — E119 Type 2 diabetes mellitus without complications: Secondary | ICD-10-CM | POA: Diagnosis not present

## 2015-02-17 DIAGNOSIS — J449 Chronic obstructive pulmonary disease, unspecified: Secondary | ICD-10-CM | POA: Diagnosis not present

## 2015-02-17 DIAGNOSIS — I739 Peripheral vascular disease, unspecified: Secondary | ICD-10-CM | POA: Diagnosis not present

## 2015-03-08 ENCOUNTER — Encounter (HOSPITAL_COMMUNITY): Payer: Self-pay

## 2015-03-08 ENCOUNTER — Emergency Department (HOSPITAL_COMMUNITY)
Admission: EM | Admit: 2015-03-08 | Discharge: 2015-03-08 | Disposition: A | Discharge status: Home / Self Care | Payer: Medicare Other | Attending: Emergency Medicine | Admitting: Emergency Medicine

## 2015-03-08 ENCOUNTER — Emergency Department (HOSPITAL_COMMUNITY): Payer: Medicare Other

## 2015-03-08 DIAGNOSIS — Z7982 Long term (current) use of aspirin: Secondary | ICD-10-CM | POA: Diagnosis not present

## 2015-03-08 DIAGNOSIS — E119 Type 2 diabetes mellitus without complications: Secondary | ICD-10-CM | POA: Diagnosis not present

## 2015-03-08 DIAGNOSIS — K9429 Other complications of gastrostomy: Secondary | ICD-10-CM

## 2015-03-08 DIAGNOSIS — Z7951 Long term (current) use of inhaled steroids: Secondary | ICD-10-CM | POA: Diagnosis not present

## 2015-03-08 DIAGNOSIS — Z79899 Other long term (current) drug therapy: Secondary | ICD-10-CM | POA: Diagnosis not present

## 2015-03-08 DIAGNOSIS — I4892 Unspecified atrial flutter: Secondary | ICD-10-CM | POA: Diagnosis not present

## 2015-03-08 DIAGNOSIS — Z862 Personal history of diseases of the blood and blood-forming organs and certain disorders involving the immune mechanism: Secondary | ICD-10-CM | POA: Diagnosis not present

## 2015-03-08 DIAGNOSIS — Z8673 Personal history of transient ischemic attack (TIA), and cerebral infarction without residual deficits: Secondary | ICD-10-CM | POA: Diagnosis not present

## 2015-03-08 DIAGNOSIS — Z8701 Personal history of pneumonia (recurrent): Secondary | ICD-10-CM | POA: Diagnosis not present

## 2015-03-08 DIAGNOSIS — G8918 Other acute postprocedural pain: Secondary | ICD-10-CM | POA: Diagnosis not present

## 2015-03-08 DIAGNOSIS — R1012 Left upper quadrant pain: Secondary | ICD-10-CM | POA: Insufficient documentation

## 2015-03-08 DIAGNOSIS — J449 Chronic obstructive pulmonary disease, unspecified: Secondary | ICD-10-CM | POA: Diagnosis not present

## 2015-03-08 DIAGNOSIS — Z87891 Personal history of nicotine dependence: Secondary | ICD-10-CM | POA: Diagnosis not present

## 2015-03-08 DIAGNOSIS — I10 Essential (primary) hypertension: Secondary | ICD-10-CM | POA: Diagnosis not present

## 2015-03-08 DIAGNOSIS — Z931 Gastrostomy status: Secondary | ICD-10-CM | POA: Diagnosis not present

## 2015-03-08 MED ORDER — IOHEXOL 300 MG/ML  SOLN
50.0000 mL | Freq: Once | INTRAMUSCULAR | Status: AC | PRN
  Administered 2015-03-08: 50 mL

## 2015-03-08 NOTE — Discharge Instructions (Signed)
Return to the ED with any concerns including increased pain, bleeding or leakage around gtube site, not able to use g tube, or any other alarming symptoms

## 2015-03-08 NOTE — ED Provider Notes (Signed)
CSN: [id number]     Arrival date & time 03/08/15  1608 History   First MD Initiated Contact with Patient 03/08/15 1803     Chief Complaint  Patient presents with  . pain at feeding tube      (Consider location/radiation/quality/duration/timing/severity/associated sxs/prior Treatment) HPI  Pt presents with c/o pain in stomach near [place] tube site.  He states that it was hurting prior to arrival in the ED but now it is resolved.  He states the tube has been functioning without difficulty.  No significant leaking or redness or drainage around tube site.  He states that pain is intermittent and is located in left upper abdomen when it is present.  He is concerned that the tube is not in the right position.  No treatment prior to arrival.  There are no other associated systemic symptoms, there are no other alleviating or modifying factors.   Past Medical History  Diagnosis Date  . DM2 (diabetes mellitus, type 2)   . Hyperlipidemia   . Hypertension   . Cerebral vascular disease   . [place] (hepatocellular carcinoma)     s/p right lobe resection 2012  . Blood dyscrasia     THROMBCYTOPATHIA  . Peripheral vascular disease     LICA stent [id number]  . Pneumonia 11/03/2012    aspiration pneumonias  . COPD (chronic obstructive pulmonary disease)     Gold C  . Thrombocytopenia   . Severe malnutrition   . Dysphagia     s/p PEG placement  . Paroxysmal atrial flutter     and fibrillation with known bradycardia, Chads2vasc of 4, not on A/C due to thrombocytopenia and functional status  . Dissecting aortic aneurysm, abdominal 11/13/2011    Overview:  Severe aortoiliac stenosis with possible a chronic dissection versus  ulcerated plaque/thrombus extending above and below the aortic  bifurcation; without aneurysmal dilation  as per CT abdomen noted 09/25/2011    Past Surgical History  Procedure Laterality Date  . Carotid stent  2002  . Liver canc    . Posterior cervical fusion/foraminotomy  09/25/2012   Procedure: POSTERIOR CERVICAL FUSION/FORAMINOTOMY LEVEL 2;  Surgeon: Eustace Moore, MD;  Location: [place] [facility];  Service: Neurosurgery;  Laterality: N/A;  Posterior Cervical three-four laminectomy, posterior cervical three-four, four-five fusion  . Esophagogastroduodenoscopy  10/01/2012    Procedure: ESOPHAGOGASTRODUODENOSCOPY (EGD);  Surgeon: Gwenyth Ober, MD;  Location: [facility];  Service: General;  Laterality: N/A;  wyatt/leone  . Peg placement  10/01/2012    Procedure: PERCUTANEOUS ENDOSCOPIC GASTROSTOMY (PEG) PLACEMENT;  Surgeon: Gwenyth Ober, MD;  Location: [place];  Service: General;  Laterality: N/A;  . Esophagogastroduodenoscopy  10/31/2012    Procedure: ESOPHAGOGASTRODUODENOSCOPY (EGD);  Surgeon: Beryle Beams, MD;  Location: [facility];  Service: Endoscopy;  Laterality: N/A;   Family History  Problem Relation Age of Onset  . CAD Neg Hx   . Cancer Father     throat, died young  . Liver disease Brother    History  Substance Use Topics  . Smoking status: Former Smoker -- 1.00 packs/day for 50 years    Types: Cigarettes    Quit date: 01/01/2012  . Smokeless tobacco: Never Used  . Alcohol Use: Yes     Comment: 3 beers per week    Review of Systems  ROS reviewed and all otherwise negative except for mentioned in HPI    Allergies  Review of patient's allergies indicates no known allergies.  Home Medications   Prior to  Admission medications   Medication Sig Start Date End Date Taking? Authorizing Provider  albuterol (PROVENTIL) (2.5 MG/3ML) 0.083% nebulizer solution Take 3 mLs (2.5 mg total) by nebulization every 6 (six) hours as needed for wheezing or shortness of breath. 09/12/14   Oswald Hillock, MD  aspirin EC 81 MG tablet Take 81 mg by mouth daily.    Historical Provider, MD  levothyroxine (SYNTHROID, LEVOTHROID) 88 MCG tablet Take 88 mcg by mouth daily before breakfast.    Historical Provider, MD  linagliptin (TRADJENTA) 5 MG TABS tablet Take 5 mg by mouth  daily.     Historical Provider, MD  mirtazapine (REMERON) 30 MG tablet Take 0.5 tablets (15 mg total) by mouth at bedtime. 09/12/14   Oswald Hillock, MD  mometasone-formoterol (DULERA) 100-5 MCG/ACT AERO Inhale 2 puffs into the lungs 2 (two) times daily. 09/12/14   Oswald Hillock, MD  Nutritional Supplements (FEEDING SUPPLEMENT, OSMOLITE 1.5 CAL,) LIQD Place 1,000 mLs into feeding tube continuous. 10/19/13   Nita Sells, MD  oxyCODONE (OXY IR/ROXICODONE) 5 MG immediate release tablet Take 15 mg by mouth every 4 (four) hours as needed for severe pain (pain in neck & shoulder).     Historical Provider, MD  tiotropium (SPIRIVA) 18 MCG inhalation capsule Place 18 mcg into inhaler and inhale 2 (two) times daily.    Historical Provider, MD  Water For Irrigation, Sterile (FREE WATER) SOLN Place 200 mLs into feeding tube every 8 (eight) hours. 10/19/13   Nita Sells, MD   BP 123/66 mmHg  Pulse 50  Temp(Src) 97.7 F (36.5 C) (Oral)  Resp 16  SpO2 99%  Vitals reviewed Physical Exam  Physical Examination: General appearance - alert, well appearing, and in no distress Mental status - alert, oriented to person, place, and time Eyes - no conjunctival injection, no scleral icterus Chest - clear to auscultation, no wheezes, rales or rhonchi, symmetric air entry Heart - normal rate, regular rhythm, normal S1, S2, no murmurs, rubs, clicks or gallops Abdomen - soft, nontender, nondistended, no masses or organomegaly, gtube site in left upper abdomen, no erythema surrounding, no drainage Extremities - peripheral pulses normal, no pedal edema, no clubbing or cyanosis Skin - normal coloration and turgor, no rashes  ED Course  Procedures (including critical care time) Labs Review Labs Reviewed - No data to display  Imaging Review Dg Abd 1 View  03/08/2015   CLINICAL DATA:  Pain around feeding tube and leakage.  EXAM: ABDOMEN - 1 VIEW  COMPARISON:  02/03/2015  FINDINGS: Injected contrast is in the  proximal small bowel, which is decompressed. No evidence of leakage. There is a small amount of contrast in the decompressed stomach. To the dual-lumen GJ catheter is stable in position. Surgical clips in the right upper abdomen. Normal bowel gas pattern. Transvenous pacing lead noted.  No abnormal abdominal calcifications.  Regional bones unremarkable.  IMPRESSION: 1. GJ tube is patent and well positioned.   Electronically Signed   By: Lucrezia Europe M.D.   On: 03/08/2015 19:19     EKG Interpretation None      MDM   Final diagnoses:  Pain from gastrostomy tube    Pt presenting with c/o pain from gj tube site- he states he is concerned that the tube is not in the right place.  His abdomen is nontender, the tube is functioning normally.  Tube study shows that tube is in proper position, no perforation.   Xray images reviewed and interpreted by me as  well.  Discharged with strict return precautions.  Pt agreeable with plan.     Alfonzo Beers, MD 03/08/15 2114

## 2015-03-08 NOTE — ED Notes (Signed)
Pt left for XRay 

## 2015-03-08 NOTE — ED Notes (Signed)
Pt here for pain around his feeding tube. States it comes and goes. He drove himself. States sometimes it leaks and sometimes it doesn't it doesn't hurt when he puts feeding through it.

## 2015-04-01 ENCOUNTER — Encounter (HOSPITAL_COMMUNITY): Payer: Self-pay | Admitting: Emergency Medicine

## 2015-04-01 ENCOUNTER — Emergency Department (HOSPITAL_COMMUNITY)
Admission: EM | Admit: 2015-04-01 | Discharge: 2015-04-01 | Disposition: A | Discharge status: Home / Self Care | Payer: Medicare Other | Attending: Emergency Medicine | Admitting: Emergency Medicine

## 2015-04-01 ENCOUNTER — Emergency Department (HOSPITAL_COMMUNITY): Payer: Medicare Other

## 2015-04-01 DIAGNOSIS — I10 Essential (primary) hypertension: Secondary | ICD-10-CM | POA: Insufficient documentation

## 2015-04-01 DIAGNOSIS — Z8673 Personal history of transient ischemic attack (TIA), and cerebral infarction without residual deficits: Secondary | ICD-10-CM | POA: Diagnosis not present

## 2015-04-01 DIAGNOSIS — Z7982 Long term (current) use of aspirin: Secondary | ICD-10-CM | POA: Insufficient documentation

## 2015-04-01 DIAGNOSIS — T85598A Other mechanical complication of other gastrointestinal prosthetic devices, implants and grafts, initial encounter: Secondary | ICD-10-CM | POA: Diagnosis present

## 2015-04-01 DIAGNOSIS — Z7951 Long term (current) use of inhaled steroids: Secondary | ICD-10-CM | POA: Insufficient documentation

## 2015-04-01 DIAGNOSIS — Z862 Personal history of diseases of the blood and blood-forming organs and certain disorders involving the immune mechanism: Secondary | ICD-10-CM | POA: Insufficient documentation

## 2015-04-01 DIAGNOSIS — Z8701 Personal history of pneumonia (recurrent): Secondary | ICD-10-CM | POA: Insufficient documentation

## 2015-04-01 DIAGNOSIS — Z8505 Personal history of malignant neoplasm of liver: Secondary | ICD-10-CM | POA: Diagnosis not present

## 2015-04-01 DIAGNOSIS — Z79899 Other long term (current) drug therapy: Secondary | ICD-10-CM | POA: Diagnosis not present

## 2015-04-01 DIAGNOSIS — Z87891 Personal history of nicotine dependence: Secondary | ICD-10-CM | POA: Insufficient documentation

## 2015-04-01 DIAGNOSIS — E119 Type 2 diabetes mellitus without complications: Secondary | ICD-10-CM | POA: Insufficient documentation

## 2015-04-01 DIAGNOSIS — J449 Chronic obstructive pulmonary disease, unspecified: Secondary | ICD-10-CM | POA: Insufficient documentation

## 2015-04-01 DIAGNOSIS — Y833 Surgical operation with formation of external stoma as the cause of abnormal reaction of the patient, or of later complication, without mention of misadventure at the time of the procedure: Secondary | ICD-10-CM | POA: Insufficient documentation

## 2015-04-01 DIAGNOSIS — K9420 Gastrostomy complication, unspecified: Secondary | ICD-10-CM

## 2015-04-01 DIAGNOSIS — K9423 Gastrostomy malfunction: Secondary | ICD-10-CM

## 2015-04-01 LAB — URINE MICROSCOPIC-ADD ON

## 2015-04-01 LAB — CBC WITH DIFFERENTIAL/PLATELET
Basophils Absolute: 0 10*3/uL (ref 0.0–0.1)
Basophils Relative: 0 % (ref 0–1)
EOS PCT: 10 % — AB (ref 0–5)
Eosinophils Absolute: 0.6 10*3/uL (ref 0.0–0.7)
HEMATOCRIT: 45.6 % (ref 39.0–52.0)
Hemoglobin: 15.8 g/dL (ref 13.0–17.0)
LYMPHS ABS: 1.4 10*3/uL (ref 0.7–4.0)
Lymphocytes Relative: 25 % (ref 12–46)
MCH: 32.6 pg (ref 26.0–34.0)
MCHC: 34.6 g/dL (ref 30.0–36.0)
MCV: 94.2 fL (ref 78.0–100.0)
MONO ABS: 0.7 10*3/uL (ref 0.1–1.0)
Monocytes Relative: 12 % (ref 3–12)
Neutro Abs: 2.8 10*3/uL (ref 1.7–7.7)
Neutrophils Relative %: 52 % (ref 43–77)
PLATELETS: 87 10*3/uL — AB (ref 150–400)
RBC: 4.84 MIL/uL (ref 4.22–5.81)
RDW: 13.5 % (ref 11.5–15.5)
WBC: 5.4 10*3/uL (ref 4.0–10.5)

## 2015-04-01 LAB — COMPREHENSIVE METABOLIC PANEL
ALT: 14 U/L (ref 0–53)
ANION GAP: 11 (ref 5–15)
AST: 34 U/L (ref 0–37)
Albumin: 3.7 g/dL (ref 3.5–5.2)
Alkaline Phosphatase: 86 U/L (ref 39–117)
BILIRUBIN TOTAL: 1.4 mg/dL — AB (ref 0.3–1.2)
BUN: 21 mg/dL (ref 6–23)
CO2: 28 mmol/L (ref 19–32)
Calcium: 9.3 mg/dL (ref 8.4–10.5)
Chloride: 100 mmol/L (ref 96–112)
Creatinine, Ser: 0.7 mg/dL (ref 0.50–1.35)
GFR calc Af Amer: >90 mL/min (ref >90)
GFR, EST NON AFRICAN AMERICAN: 86 mL/min — AB (ref >90)
Glucose, Bld: 108 mg/dL — ABNORMAL HIGH (ref 70–99)
Potassium: 4.3 mmol/L (ref 3.5–5.1)
Sodium: 139 mmol/L (ref 135–145)
Total Protein: 7.1 g/dL (ref 6.0–8.3)

## 2015-04-01 LAB — URINALYSIS, ROUTINE W REFLEX MICROSCOPIC
Bilirubin Urine: NEGATIVE
Glucose, UA: NEGATIVE mg/dL
Hgb urine dipstick: NEGATIVE
Ketones, ur: NEGATIVE mg/dL
Nitrite: NEGATIVE
PROTEIN: NEGATIVE mg/dL
Specific Gravity, Urine: 1.019 (ref 1.005–1.030)
Urobilinogen, UA: 1 mg/dL (ref 0.0–1.0)
pH: 8 (ref 5.0–8.0)

## 2015-04-01 MED ORDER — IOHEXOL 300 MG/ML  SOLN
50.0000 mL | Freq: Once | INTRAMUSCULAR | Status: AC | PRN
  Administered 2015-04-01: 20 mL
  Administered 2015-04-01: 1 mL

## 2015-04-01 NOTE — ED Notes (Signed)
Called lab about missing platelet results. Per lab, pt is thrombocytopenic and they had to run a smear. States they should result shortly.

## 2015-04-01 NOTE — ED Notes (Signed)
Reevaluated patient. Patient advised he is still having pain at the feeding tube site, but no worse.   Patient advised of wait time.

## 2015-04-01 NOTE — ED Notes (Addendum)
States feeding tube insertion site is sore, red, draining yellowish greenish liquid. States "it is too tight"  Has had a feeding tube for 3.5 yrs, this one placed a [facility].

## 2015-04-01 NOTE — Discharge Instructions (Signed)
Care of a Feeding Tube People who have trouble swallowing or cannot take food or medicine by mouth are sometimes given feeding tubes. A feeding tube can go into the nose and down to the stomach or through the skin in the abdomen and into the stomach or small bowel. Some of the names of these feeding tubes are gastrostomy tubes, PEG lines, nasogastric tubes, and gastrojejunostomy tubes.  SUPPLIES NEEDED TO CARE FOR THE TUBE SITE  Clean gloves.  Clean wash cloth, gauze pads, or soft paper towel.  Cotton swabs.  Skin barrier ointment or cream.  Soap and water.  Pre-cut foam pads or gauze (that go around the tube).  Tube tape. TUBE SITE CARE 1. Have all supplies ready and available. 2. Wash hands well. 3. Put on clean gloves. 4. Remove the soiled foam pad or gauze, if present, that is found under the tube stabilizer. Change the foam pad or gauze daily or when soiled or moist. 5. Check the skin around the tube site for redness, rash, swelling, drainage, or extra tissue growth. If you notice any of these, call your caregiver. 6. Moisten gauze and cotton swabs with water and soap. 7. Wipe the area closest to the tube (right near the stoma) with cotton swabs. Wipe the surrounding skin with moistened gauze. Rinse with water. 8. Dry the skin and stoma site with a dry gauze pad or soft paper towel. Do not use antibiotic ointments at the tube site. 9. If the skin is red, apply a skin barrier cream or ointment (such as petroleum jelly) in a circular motion, using a cotton swab. The cream or ointment will provide a moisture barrier for the skin and helps with wound healing. 10. Apply a new pre-cut foam pad or gauze around the tube. Secure it with tape around the edges. If no drainage is present, foam pads or gauze may be left off. 11. Use tape or an anchoring device to fasten the feeding tube to the skin for comfort or as directed. Rotate where you tape the tube to avoid skin damage from the  adhesive. 12. Position the person in a semi-upright position (30-45 degree angle). 13. Throw away used supplies. 14. Remove gloves. 15. Wash hands. SUPPLIES NEEDED TO FLUSH A FEEDING TUBE  Clean gloves.  60 mL syringe (that connects to the feeding tube).  Towel.  Water. FLUSHING A FEEDING TUBE  1. Have all supplies ready and available. 2. Wash hands well. 3. Put on clean gloves. 4. Draw up 30 mL of water in the syringe. 5. Kink the feeding tube while disconnecting it from the feeding-bag tubing or while removing the plug at the end of the tube. Kinking closes the tube and prevents secretions in the tube from spilling out. 6. Insert the tip of the syringe into the end of the feeding tube. Release the kink. Slowly inject the water. 7. If unable to inject the water, the person with the feeding tube should lay on his or her left side. The tip of the tube may be against the stomach wall, blocking fluid flow. Changing positions may move the tip away from the stomach wall. After repositioning, try injecting the water again. 8. After injecting the water, remove the syringe. 9. Always flush before giving the first medicine, between medicines, and after the final medicine before starting a feeding. This prevents medicines from clogging the tube. 10. Throw away used supplies. 11. Remove gloves. 12. Wash hands. Document Released: 12/17/2005 Document Revised: 12/03/2012 Document Reviewed: 07/31/2012  [employer]® Patient Information ©2015 [employer]. This information is not intended to replace advice given to you by your health care provider. Make sure you discuss any questions you have with your health care provider. ° °

## 2015-04-01 NOTE — ED Notes (Signed)
MD Rancour at bedside 

## 2015-04-01 NOTE — ED Provider Notes (Signed)
CSN: [id number]     Arrival date & time 04/01/15  1151 History   First MD Initiated Contact with Patient 04/01/15 1503     Chief Complaint  Patient presents with  . feeding tube problem      (Consider location/radiation/quality/duration/timing/severity/associated sxs/prior Treatment) HPI Comments: Patient presents with pain near his GJ tube site. This is nothing going on for several days. He states this been read and irritated with some drainage around the G-tube site. It is still functioning normally. He is able to tolerate his feedings without a problem. No vomiting. No fever. No significant abdominal pain. No diarrhea. Tube was placed at [place] 3 years ago. He reports there is some yellowish drainage around the tube and the skin is sore and red but denies any bleeding.  The history is provided by the patient.    Past Medical History  Diagnosis Date  . DM2 (diabetes mellitus, type 2)   . Hyperlipidemia   . Hypertension   . Cerebral vascular disease   . [place] (hepatocellular carcinoma)     s/p right lobe resection 2012  . Blood dyscrasia     THROMBCYTOPATHIA  . Peripheral vascular disease     LICA stent [id number]  . Pneumonia 11/03/2012    aspiration pneumonias  . COPD (chronic obstructive pulmonary disease)     Gold C  . Thrombocytopenia   . Severe malnutrition   . Dysphagia     s/p PEG placement  . Paroxysmal atrial flutter     and fibrillation with known bradycardia, Chads2vasc of 4, not on A/C due to thrombocytopenia and functional status  . Dissecting aortic aneurysm, abdominal 11/13/2011    Overview:  Severe aortoiliac stenosis with possible a chronic dissection versus  ulcerated plaque/thrombus extending above and below the aortic  bifurcation; without aneurysmal dilation  as per CT abdomen noted 09/25/2011    Past Surgical History  Procedure Laterality Date  . Carotid stent  2002  . Liver canc    . Posterior cervical fusion/foraminotomy  09/25/2012    Procedure: POSTERIOR  CERVICAL FUSION/FORAMINOTOMY LEVEL 2;  Surgeon: Eustace Moore, MD;  Location: [facility];  Service: Neurosurgery;  Laterality: N/A;  Posterior Cervical three-four laminectomy, posterior cervical three-four, four-five fusion  . Esophagogastroduodenoscopy  10/01/2012    Procedure: ESOPHAGOGASTRODUODENOSCOPY (EGD);  Surgeon: Gwenyth Ober, MD;  Location: [place] [facility];  Service: General;  Laterality: N/A;  wyatt/leone  . Peg placement  10/01/2012    Procedure: PERCUTANEOUS ENDOSCOPIC GASTROSTOMY (PEG) PLACEMENT;  Surgeon: Gwenyth Ober, MD;  Location: [place] [facility];  Service: General;  Laterality: N/A;  . Esophagogastroduodenoscopy  10/31/2012    Procedure: ESOPHAGOGASTRODUODENOSCOPY (EGD);  Surgeon: Beryle Beams, MD;  Location: [facility];  Service: Endoscopy;  Laterality: N/A;   Family History  Problem Relation Age of Onset  . CAD Neg Hx   . Cancer Father     throat, died young  . Liver disease Brother    History  Substance Use Topics  . Smoking status: Former Smoker -- 1.00 packs/day for 50 years    Types: Cigarettes    Quit date: 01/01/2012  . Smokeless tobacco: Never Used  . Alcohol Use: Yes     Comment: 3 beers per week    Review of Systems  Constitutional: Negative for fever, activity change and appetite change.  HENT: Negative for congestion and rhinorrhea.   Respiratory: Negative for cough, chest tightness and shortness of breath.   Cardiovascular: Negative for chest pain.  Gastrointestinal: Negative for  nausea, vomiting and abdominal pain.  Genitourinary: Negative for dysuria and hematuria.  Musculoskeletal: Negative for myalgias, back pain and arthralgias.  Skin: Negative for rash.  Neurological: Negative for dizziness, weakness, light-headedness and headaches.  A complete 10 system review of systems was obtained and all systems are negative except as noted in the HPI and PMH.      Allergies  Review of patient's allergies indicates no known allergies.  Home  Medications   Prior to Admission medications   Medication Sig Start Date End Date Taking? Authorizing Provider  albuterol (PROVENTIL) (2.5 MG/3ML) 0.083% nebulizer solution Take 3 mLs (2.5 mg total) by nebulization every 6 (six) hours as needed for wheezing or shortness of breath. 09/12/14   Oswald Hillock, MD  aspirin EC 81 MG tablet Take 81 mg by mouth daily.    Historical Provider, MD  levothyroxine (SYNTHROID, LEVOTHROID) 88 MCG tablet Take 88 mcg by mouth daily before breakfast.    Historical Provider, MD  linagliptin (TRADJENTA) 5 MG TABS tablet Take 5 mg by mouth daily.     Historical Provider, MD  mirtazapine (REMERON) 30 MG tablet Take 0.5 tablets (15 mg total) by mouth at bedtime. 09/12/14   Oswald Hillock, MD  mometasone-formoterol (DULERA) 100-5 MCG/ACT AERO Inhale 2 puffs into the lungs 2 (two) times daily. 09/12/14   Oswald Hillock, MD  Nutritional Supplements (FEEDING SUPPLEMENT, OSMOLITE 1.5 CAL,) LIQD Place 1,000 mLs into feeding tube continuous. 10/19/13   Nita Sells, MD  oxyCODONE (OXY IR/ROXICODONE) 5 MG immediate release tablet Take 15 mg by mouth every 4 (four) hours as needed for severe pain (pain in neck & shoulder).     Historical Provider, MD  tiotropium (SPIRIVA) 18 MCG inhalation capsule Place 18 mcg into inhaler and inhale 2 (two) times daily.    Historical Provider, MD  Water For Irrigation, Sterile (FREE WATER) SOLN Place 200 mLs into feeding tube every 8 (eight) hours. 10/19/13   Nita Sells, MD   BP 156/67 mmHg  Pulse 50  Temp(Src) 97.9 F (36.6 C) (Oral)  Resp 18  Ht 5\' 9"  (1.753 m)  Wt 125 lb 9.6 oz (56.972 kg)  BMI 18.54 kg/m2  SpO2 99% Physical Exam  Constitutional: He is oriented to person, place, and time. He appears well-developed and well-nourished. No distress.  HENT:  Head: Normocephalic and atraumatic.  Mouth/Throat: Oropharynx is clear and moist. No oropharyngeal exudate.  Eyes: Conjunctivae and EOM are normal. Pupils are equal, round,  and reactive to light.  Neck: Normal range of motion. Neck supple.  No meningismus.  Cardiovascular: Normal rate, regular rhythm, normal heart sounds and intact distal pulses.   No murmur heard. Pulmonary/Chest: Effort normal and breath sounds normal. No respiratory distress.  Abdominal: Soft. There is tenderness. There is no rebound and no guarding.  GJ tube in left upper quadrant. There is surrounding erythema and maceration of the skin. There is some yellowish discharge from around the J-tube site. No significant abdominal pain.   Musculoskeletal: Normal range of motion. He exhibits no edema or tenderness.  Neurological: He is alert and oriented to person, place, and time. No cranial nerve deficit. He exhibits normal muscle tone. Coordination normal.  No ataxia on finger to nose bilaterally. No pronator drift. 5/5 strength throughout. CN 2-12 intact. Negative Romberg. Equal grip strength. Sensation intact. Gait is normal.   Skin: Skin is warm.  Psychiatric: He has a normal mood and affect. His behavior is normal.  Nursing note and vitals reviewed.  ED Course  Procedures (including critical care time) Labs Review Labs Reviewed  CBC WITH DIFFERENTIAL/PLATELET - Abnormal; Notable for the following:    Platelets 87 (*)    Eosinophils Relative 10 (*)    All other components within normal limits  COMPREHENSIVE METABOLIC PANEL - Abnormal; Notable for the following:    Glucose, Bld 108 (*)    Total Bilirubin 1.4 (*)    GFR calc non Af Amer 86 (*)    All other components within normal limits  URINALYSIS, ROUTINE W REFLEX MICROSCOPIC - Abnormal; Notable for the following:    Leukocytes, UA TRACE (*)    All other components within normal limits  URINE MICROSCOPIC-ADD ON - Abnormal; Notable for the following:    Bacteria, UA FEW (*)    Casts HYALINE CASTS (*)    All other components within normal limits    Imaging Review Dg Abd 1 View  04/01/2015   CLINICAL DATA:  Pain and feeding  tube insertion site. Direct examination of the gastrojejunostomy tube insertion site demonstrates no evidence of erythema or skin breakdown.  EXAM: ABDOMEN - 1 VIEW  COMPARISON:  03/08/2015  FINDINGS: Contrast was injected in the jejunal port which fills the jejunum. The tube is a probe early positioned in the proximal jejunum. The balloon is positioned in the lumen of the stomach. There is no contrast extravasation to suggest leakage.  IMPRESSION: The gastrojejunostomy tube is appropriately positioned and without leakage.   Electronically Signed   By: Marybelle Killings M.D.   On: 04/01/2015 16:34     EKG Interpretation None      MDM   Final diagnoses:  Complication of feeding tube   Pain at Rives site with leakage and maceration of the skin.  No vomiting.  GJ tube functioning appropriately on x-ray. Discussed with Dr. Barbie Banner.  Patient's skin is irritated and macerated. Advised to put gauze dressing behind hub to prevent skin breakdown. No evidence of active infection. Labs show stable thrombocytopenia. No evidence of active bleeding. Urinalysis is negative. Patient instructed to follow up with his PCP for reevaluation next week. Return precautions discussed.  Ezequiel Essex, MD 04/02/15 Laureen Abrahams

## 2015-04-30 ENCOUNTER — Emergency Department (HOSPITAL_COMMUNITY): Payer: Medicare Other

## 2015-04-30 ENCOUNTER — Encounter (HOSPITAL_COMMUNITY): Payer: Self-pay | Admitting: *Deleted

## 2015-04-30 ENCOUNTER — Emergency Department (HOSPITAL_COMMUNITY)
Admission: EM | Admit: 2015-04-30 | Discharge: 2015-04-30 | Disposition: A | Discharge status: Home / Self Care | Payer: Medicare Other | Attending: Emergency Medicine | Admitting: Emergency Medicine

## 2015-04-30 DIAGNOSIS — I10 Essential (primary) hypertension: Secondary | ICD-10-CM | POA: Diagnosis not present

## 2015-04-30 DIAGNOSIS — K9423 Gastrostomy malfunction: Secondary | ICD-10-CM | POA: Insufficient documentation

## 2015-04-30 DIAGNOSIS — Z79899 Other long term (current) drug therapy: Secondary | ICD-10-CM | POA: Insufficient documentation

## 2015-04-30 DIAGNOSIS — Z87891 Personal history of nicotine dependence: Secondary | ICD-10-CM | POA: Diagnosis not present

## 2015-04-30 DIAGNOSIS — Z7951 Long term (current) use of inhaled steroids: Secondary | ICD-10-CM | POA: Insufficient documentation

## 2015-04-30 DIAGNOSIS — E119 Type 2 diabetes mellitus without complications: Secondary | ICD-10-CM | POA: Insufficient documentation

## 2015-04-30 DIAGNOSIS — Z8505 Personal history of malignant neoplasm of liver: Secondary | ICD-10-CM | POA: Insufficient documentation

## 2015-04-30 DIAGNOSIS — Z7982 Long term (current) use of aspirin: Secondary | ICD-10-CM | POA: Diagnosis not present

## 2015-04-30 DIAGNOSIS — Z8701 Personal history of pneumonia (recurrent): Secondary | ICD-10-CM | POA: Diagnosis not present

## 2015-04-30 DIAGNOSIS — Z8673 Personal history of transient ischemic attack (TIA), and cerebral infarction without residual deficits: Secondary | ICD-10-CM | POA: Diagnosis not present

## 2015-04-30 DIAGNOSIS — J449 Chronic obstructive pulmonary disease, unspecified: Secondary | ICD-10-CM | POA: Diagnosis not present

## 2015-04-30 DIAGNOSIS — Z862 Personal history of diseases of the blood and blood-forming organs and certain disorders involving the immune mechanism: Secondary | ICD-10-CM | POA: Diagnosis not present

## 2015-04-30 NOTE — ED Notes (Signed)
Pt states that he noticed his feeding tube was clogged this morning. Pt states that he has tried using coke but it did not help

## 2015-04-30 NOTE — ED Provider Notes (Signed)
CSN: [id number]     Arrival date & time 04/30/15  1316 History   First MD Initiated Contact with Patient 04/30/15 1701     Chief Complaint  Patient presents with  . feeding tube clogged      (Consider location/radiation/quality/duration/timing/severity/associated sxs/prior Treatment) The history is provided by the patient.  [name] is a 81 y.o. male hx of DM, HL, HTN, stroke with chronic feeding tube here presenting with feeding tube clogged. He states that the smaller of his port has clogged. He usually put tube feeds into that tube. Tried to use coke but unable to unclog it. Denies vomiting or abdominal pain. Denies fevers.    Past Medical History  Diagnosis Date  . DM2 (diabetes mellitus, type 2)   . Hyperlipidemia   . Hypertension   . Cerebral vascular disease   . [place] (hepatocellular carcinoma)     s/p right lobe resection 2012  . Blood dyscrasia     THROMBCYTOPATHIA  . Peripheral vascular disease     LICA stent [id number]  . Pneumonia 11/03/2012    aspiration pneumonias  . COPD (chronic obstructive pulmonary disease)     Gold C  . Thrombocytopenia   . Severe malnutrition   . Dysphagia     s/p PEG placement  . Paroxysmal atrial flutter     and fibrillation with known bradycardia, Chads2vasc of 4, not on A/C due to thrombocytopenia and functional status  . Dissecting aortic aneurysm, abdominal 11/13/2011    Overview:  Severe aortoiliac stenosis with possible a chronic dissection versus  ulcerated plaque/thrombus extending above and below the aortic  bifurcation; without aneurysmal dilation  as per CT abdomen noted 09/25/2011    Past Surgical History  Procedure Laterality Date  . Carotid stent  2002  . Liver canc    . Posterior cervical fusion/foraminotomy  09/25/2012    Procedure: POSTERIOR CERVICAL FUSION/FORAMINOTOMY LEVEL 2;  Surgeon: Eustace Moore, MD;  Location: [place] [facility];  Service: Neurosurgery;  Laterality: N/A;  Posterior Cervical three-four laminectomy,  posterior cervical three-four, four-five fusion  . Esophagogastroduodenoscopy  10/01/2012    Procedure: ESOPHAGOGASTRODUODENOSCOPY (EGD);  Surgeon: Gwenyth Ober, MD;  Location: [place] [place];  Service: General;  Laterality: N/A;  wyatt/leone  . Peg placement  10/01/2012    Procedure: PERCUTANEOUS ENDOSCOPIC GASTROSTOMY (PEG) PLACEMENT;  Surgeon: Gwenyth Ober, MD;  Location: [place];  Service: General;  Laterality: N/A;  . Esophagogastroduodenoscopy  10/31/2012    Procedure: ESOPHAGOGASTRODUODENOSCOPY (EGD);  Surgeon: Beryle Beams, MD;  Location: [facility];  Service: Endoscopy;  Laterality: N/A;   Family History  Problem Relation Age of Onset  . CAD Neg Hx   . Cancer Father     throat, died young  . Liver disease Brother    History  Substance Use Topics  . Smoking status: Former Smoker -- 1.00 packs/day for 50 years    Types: Cigarettes    Quit date: 01/01/2012  . Smokeless tobacco: Never Used  . Alcohol Use: Yes     Comment: 3 beers per week    Review of Systems  Gastrointestinal:       G tube malfunction   All other systems reviewed and are negative.     Allergies  Review of patient's allergies indicates no known allergies.  Home Medications   Prior to Admission medications   Medication Sig Start Date End Date Taking? Authorizing Provider  albuterol (PROVENTIL) (2.5 MG/3ML) 0.083% nebulizer solution Take 3 mLs (2.5 mg total) by  nebulization every 6 (six) hours as needed for wheezing or shortness of breath. 09/12/14   Oswald Hillock, MD  aspirin EC 81 MG tablet Take 81 mg by mouth daily.    Historical Provider, MD  levothyroxine (SYNTHROID, LEVOTHROID) 88 MCG tablet Take 88 mcg by mouth daily before breakfast.    Historical Provider, MD  linagliptin (TRADJENTA) 5 MG TABS tablet Take 5 mg by mouth daily.     Historical Provider, MD  mirtazapine (REMERON) 30 MG tablet Take 0.5 tablets (15 mg total) by mouth at bedtime. 09/12/14   Oswald Hillock, MD  mometasone-formoterol  (DULERA) 100-5 MCG/ACT AERO Inhale 2 puffs into the lungs 2 (two) times daily. 09/12/14   Oswald Hillock, MD  Nutritional Supplements (FEEDING SUPPLEMENT, OSMOLITE 1.5 CAL,) LIQD Place 1,000 mLs into feeding tube continuous. 10/19/13   Nita Sells, MD  oxyCODONE (OXY IR/ROXICODONE) 5 MG immediate release tablet Take 15 mg by mouth every 4 (four) hours as needed for severe pain (pain in neck & shoulder).     Historical Provider, MD  tiotropium (SPIRIVA) 18 MCG inhalation capsule Place 18 mcg into inhaler and inhale 2 (two) times daily.    Historical Provider, MD  Water For Irrigation, Sterile (FREE WATER) SOLN Place 200 mLs into feeding tube every 8 (eight) hours. 10/19/13   Nita Sells, MD   BP 172/66 mmHg  Pulse 50  Temp(Src) 97.4 F (36.3 C) (Oral)  Resp 18  SpO2 100% Physical Exam  Constitutional: He is oriented to person, place, and time.  Chronically ill, NAD   HENT:  Head: Normocephalic.  Mouth/Throat: Oropharynx is clear and moist.  Eyes: Conjunctivae are normal. Pupils are equal, round, and reactive to light.  Neck: Normal range of motion. Neck supple.  Cardiovascular: Normal rate, regular rhythm and normal heart sounds.   Pulmonary/Chest: Effort normal and breath sounds normal. No respiratory distress. He has no wheezes. He has no rales.  Abdominal: Soft. Bowel sounds are normal. He exhibits no distension. There is no tenderness.  G tube in place, small port clogged   Musculoskeletal: Normal range of motion. He exhibits no edema or tenderness.  Neurological: He is alert and oriented to person, place, and time. No cranial nerve deficit. Coordination normal.  Skin: Skin is warm and dry.  Psychiatric: He has a normal mood and affect. His behavior is normal. Judgment normal.  Nursing note and vitals reviewed.   ED Course  Procedures (including critical care time) Labs Review Labs Reviewed - No data to display  Imaging Review No results found.   EKG  Interpretation None      MDM   Final diagnoses:  None   [name] is a 81 y.o. male here with G tube clogged. Attempted to flush the smaller port but was unable to. Will replace G tube. Patient has G tube for many years.   5:48 PM Upon further chart review. It is a GJ tube that always gets replaced at IR. Discussed with Dr. Earleen Newport from [facility]. He will bring patient back on Monday morning to get it replaced. He can use the other port for the time being for the tube feeds.     Wandra Arthurs, MD 04/30/15 [phone]

## 2015-04-30 NOTE — Discharge Instructions (Signed)
Go over to Interventional Radiology on Monday morning to replace the tube.   Use the other tube for your tube feedings for now.   Follow up with your doctor.   Return to ER if you have vomiting, severe abdominal pain.

## 2015-05-02 ENCOUNTER — Other Ambulatory Visit (HOSPITAL_COMMUNITY): Payer: Self-pay | Admitting: Interventional Radiology

## 2015-05-02 ENCOUNTER — Ambulatory Visit (HOSPITAL_COMMUNITY)
Admission: RE | Admit: 2015-05-02 | Discharge: 2015-05-02 | Disposition: A | Discharge status: Home / Self Care | Payer: Medicare Other | Source: Ambulatory Visit | Attending: Interventional Radiology | Admitting: Interventional Radiology

## 2015-05-02 ENCOUNTER — Encounter: Payer: Self-pay | Admitting: *Deleted

## 2015-05-02 DIAGNOSIS — E43 Unspecified severe protein-calorie malnutrition: Secondary | ICD-10-CM

## 2015-05-02 DIAGNOSIS — K9423 Gastrostomy malfunction: Secondary | ICD-10-CM | POA: Diagnosis present

## 2015-05-02 DIAGNOSIS — Y732 Prosthetic and other implants, materials and accessory gastroenterology and urology devices associated with adverse incidents: Secondary | ICD-10-CM | POA: Diagnosis not present

## 2015-05-02 MED ORDER — IOHEXOL 300 MG/ML  SOLN
50.0000 mL | Freq: Once | INTRAMUSCULAR | Status: AC | PRN
  Administered 2015-05-02: 40 mL via INTRAVENOUS

## 2015-05-02 NOTE — Procedures (Signed)
GJ tube recanalized No comp

## 2015-05-06 ENCOUNTER — Other Ambulatory Visit (HOSPITAL_COMMUNITY): Payer: Self-pay | Admitting: Diagnostic Radiology

## 2015-05-06 ENCOUNTER — Ambulatory Visit (HOSPITAL_COMMUNITY)
Admission: RE | Admit: 2015-05-06 | Discharge: 2015-05-06 | Disposition: A | Discharge status: Home / Self Care | Payer: Medicare Other | Source: Ambulatory Visit | Attending: Diagnostic Radiology | Admitting: Diagnostic Radiology

## 2015-05-06 DIAGNOSIS — I639 Cerebral infarction, unspecified: Secondary | ICD-10-CM

## 2015-05-06 DIAGNOSIS — Z434 Encounter for attention to other artificial openings of digestive tract: Secondary | ICD-10-CM | POA: Insufficient documentation

## 2015-05-06 MED ORDER — IOHEXOL 300 MG/ML  SOLN
50.0000 mL | Freq: Once | INTRAMUSCULAR | Status: AC | PRN
  Administered 2015-05-06: 20 mL

## 2015-05-06 MED ORDER — LIDOCAINE VISCOUS 2 % MT SOLN
OROMUCOSAL | Status: AC
  Filled 2015-05-06: qty 15

## 2015-05-06 NOTE — Procedures (Signed)
Successful exchange of the 24 [place] GJ tube.  Tip in small bowel.  No immediate complication.

## 2015-05-26 ENCOUNTER — Encounter: Payer: Self-pay | Admitting: Cardiovascular Disease

## 2015-06-17 ENCOUNTER — Other Ambulatory Visit: Payer: Self-pay | Admitting: Cardiovascular Disease

## 2015-06-17 DIAGNOSIS — I6529 Occlusion and stenosis of unspecified carotid artery: Secondary | ICD-10-CM

## 2015-06-20 ENCOUNTER — Emergency Department (HOSPITAL_COMMUNITY)
Admission: EM | Admit: 2015-06-20 | Discharge: 2015-06-21 | Disposition: A | Discharge status: Home / Self Care | Payer: No Typology Code available for payment source | Attending: Emergency Medicine | Admitting: Emergency Medicine

## 2015-06-20 ENCOUNTER — Emergency Department (HOSPITAL_COMMUNITY): Payer: No Typology Code available for payment source

## 2015-06-20 ENCOUNTER — Encounter (HOSPITAL_COMMUNITY): Payer: Self-pay | Admitting: Emergency Medicine

## 2015-06-20 DIAGNOSIS — Y9389 Activity, other specified: Secondary | ICD-10-CM | POA: Diagnosis not present

## 2015-06-20 DIAGNOSIS — Y998 Other external cause status: Secondary | ICD-10-CM | POA: Diagnosis not present

## 2015-06-20 DIAGNOSIS — S51812A Laceration without foreign body of left forearm, initial encounter: Secondary | ICD-10-CM | POA: Diagnosis not present

## 2015-06-20 DIAGNOSIS — Z8701 Personal history of pneumonia (recurrent): Secondary | ICD-10-CM | POA: Diagnosis not present

## 2015-06-20 DIAGNOSIS — Z87891 Personal history of nicotine dependence: Secondary | ICD-10-CM | POA: Diagnosis not present

## 2015-06-20 DIAGNOSIS — J441 Chronic obstructive pulmonary disease with (acute) exacerbation: Secondary | ICD-10-CM | POA: Diagnosis not present

## 2015-06-20 DIAGNOSIS — Z7951 Long term (current) use of inhaled steroids: Secondary | ICD-10-CM | POA: Insufficient documentation

## 2015-06-20 DIAGNOSIS — S9001XA Contusion of right ankle, initial encounter: Secondary | ICD-10-CM | POA: Diagnosis not present

## 2015-06-20 DIAGNOSIS — S199XXA Unspecified injury of neck, initial encounter: Secondary | ICD-10-CM | POA: Insufficient documentation

## 2015-06-20 DIAGNOSIS — I10 Essential (primary) hypertension: Secondary | ICD-10-CM | POA: Insufficient documentation

## 2015-06-20 DIAGNOSIS — T148XXA Other injury of unspecified body region, initial encounter: Secondary | ICD-10-CM

## 2015-06-20 DIAGNOSIS — M542 Cervicalgia: Secondary | ICD-10-CM

## 2015-06-20 DIAGNOSIS — R001 Bradycardia, unspecified: Secondary | ICD-10-CM | POA: Insufficient documentation

## 2015-06-20 DIAGNOSIS — S99911A Unspecified injury of right ankle, initial encounter: Secondary | ICD-10-CM | POA: Diagnosis present

## 2015-06-20 DIAGNOSIS — Z79899 Other long term (current) drug therapy: Secondary | ICD-10-CM | POA: Diagnosis not present

## 2015-06-20 DIAGNOSIS — S3992XA Unspecified injury of lower back, initial encounter: Secondary | ICD-10-CM | POA: Diagnosis not present

## 2015-06-20 DIAGNOSIS — Z862 Personal history of diseases of the blood and blood-forming organs and certain disorders involving the immune mechanism: Secondary | ICD-10-CM | POA: Insufficient documentation

## 2015-06-20 DIAGNOSIS — S61412A Laceration without foreign body of left hand, initial encounter: Secondary | ICD-10-CM | POA: Diagnosis not present

## 2015-06-20 DIAGNOSIS — Z7982 Long term (current) use of aspirin: Secondary | ICD-10-CM | POA: Diagnosis not present

## 2015-06-20 DIAGNOSIS — Y92410 Unspecified street and highway as the place of occurrence of the external cause: Secondary | ICD-10-CM | POA: Diagnosis not present

## 2015-06-20 DIAGNOSIS — E119 Type 2 diabetes mellitus without complications: Secondary | ICD-10-CM | POA: Diagnosis not present

## 2015-06-20 LAB — CBC WITH DIFFERENTIAL/PLATELET
BASOS ABS: 0 10*3/uL (ref 0.0–0.1)
Basophils Relative: 0 % (ref 0–1)
EOS ABS: 0.1 10*3/uL (ref 0.0–0.7)
EOS PCT: 1 % (ref 0–5)
HCT: 44.2 % (ref 39.0–52.0)
Hemoglobin: 15.5 g/dL (ref 13.0–17.0)
Lymphocytes Relative: 14 % (ref 12–46)
Lymphs Abs: 1 10*3/uL (ref 0.7–4.0)
MCH: 32.8 pg (ref 26.0–34.0)
MCHC: 35.1 g/dL (ref 30.0–36.0)
MCV: 93.4 fL (ref 78.0–100.0)
Monocytes Absolute: 0.9 10*3/uL (ref 0.1–1.0)
Monocytes Relative: 12 % (ref 3–12)
Neutro Abs: 5.3 10*3/uL (ref 1.7–7.7)
Neutrophils Relative %: 73 % (ref 43–77)
PLATELETS: 83 10*3/uL — AB (ref 150–400)
RBC: 4.73 MIL/uL (ref 4.22–5.81)
RDW: 13.7 % (ref 11.5–15.5)
WBC: 7.3 10*3/uL (ref 4.0–10.5)

## 2015-06-20 LAB — BASIC METABOLIC PANEL
Anion gap: 12 (ref 5–15)
BUN: 25 mg/dL — ABNORMAL HIGH (ref 6–20)
CHLORIDE: 100 mmol/L — AB (ref 101–111)
CO2: 26 mmol/L (ref 22–32)
Calcium: 9.3 mg/dL (ref 8.9–10.3)
Creatinine, Ser: 0.91 mg/dL (ref 0.61–1.24)
GFR calc Af Amer: >60 mL/min (ref >60)
GFR calc non Af Amer: >60 mL/min (ref >60)
GLUCOSE: 128 mg/dL — AB (ref 65–99)
POTASSIUM: 4.2 mmol/L (ref 3.5–5.1)
Sodium: 138 mmol/L (ref 135–145)

## 2015-06-20 MED ORDER — IPRATROPIUM-ALBUTEROL 0.5-2.5 (3) MG/3ML IN SOLN
RESPIRATORY_TRACT | Status: AC
  Filled 2015-06-20: qty 3

## 2015-06-20 MED ORDER — FENTANYL CITRATE (PF) 100 MCG/2ML IJ SOLN
50.0000 ug | Freq: Once | INTRAMUSCULAR | Status: AC
  Administered 2015-06-20: 50 ug via INTRAVENOUS
  Filled 2015-06-20: qty 2

## 2015-06-20 MED ORDER — IPRATROPIUM-ALBUTEROL 0.5-2.5 (3) MG/3ML IN SOLN
3.0000 mL | Freq: Once | RESPIRATORY_TRACT | Status: AC
  Administered 2015-06-20: 3 mL via RESPIRATORY_TRACT

## 2015-06-20 MED ORDER — IOHEXOL 300 MG/ML  SOLN
100.0000 mL | Freq: Once | INTRAMUSCULAR | Status: AC | PRN
  Administered 2015-06-20: 100 mL via INTRAVENOUS

## 2015-06-20 NOTE — ED Notes (Signed)
Swelling noted to right ankle. Denies tenderness.  [facility] said there was some glass noted in his shoes.  No lacerations noted to feet at this time.

## 2015-06-20 NOTE — ED Notes (Signed)
C- collar applied at triage . 
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
## 2015-06-20 NOTE — ED Notes (Signed)
Alesia Morin., RN advised the patient is having trouble breathing with the neck brace.  The patent does have COPD and forgot his inhaler.  The patient asked if we could give him a breathing treatment.
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
## 2015-06-21 MED ORDER — OXYCODONE HCL 5 MG PO TABS: 5.0000 mg | ORAL_TABLET | ORAL | Status: DC | PRN
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
## 2015-06-21 NOTE — ED Notes (Signed)
Pt ambulated in hall.  Gait steady and even.  Pt denies any dizziness.

## 2015-06-21 NOTE — ED Notes (Signed)
Wound care and ASO applied by Olen Cordial, EMT.  Cleansing, bacitracin and gauze covers placed over abrasions on bilateral ankles and left forearm.

## 2015-06-27 ENCOUNTER — Encounter (HOSPITAL_COMMUNITY): Payer: Medicare Other

## 2015-07-19 ENCOUNTER — Other Ambulatory Visit (HOSPITAL_COMMUNITY): Payer: Self-pay | Admitting: Interventional Radiology

## 2015-07-19 ENCOUNTER — Ambulatory Visit (HOSPITAL_COMMUNITY)
Admission: RE | Admit: 2015-07-19 | Discharge: 2015-07-19 | Disposition: A | Discharge status: Home / Self Care | Payer: Medicare Other | Source: Ambulatory Visit | Attending: Interventional Radiology | Admitting: Interventional Radiology

## 2015-07-19 DIAGNOSIS — Y833 Surgical operation with formation of external stoma as the cause of abnormal reaction of the patient, or of later complication, without mention of misadventure at the time of the procedure: Secondary | ICD-10-CM | POA: Insufficient documentation

## 2015-07-19 DIAGNOSIS — R6251 Failure to thrive (child): Secondary | ICD-10-CM

## 2015-07-19 DIAGNOSIS — K9413 Enterostomy malfunction: Secondary | ICD-10-CM | POA: Diagnosis present

## 2015-07-19 MED ORDER — IOHEXOL 300 MG/ML  SOLN
50.0000 mL | Freq: Once | INTRAMUSCULAR | Status: AC | PRN
  Administered 2015-07-19: 10 mL via INTRAVENOUS

## 2015-07-19 NOTE — Procedures (Signed)
24 Fr GJ conversion Tip jejunum No comp/EBL

## 2015-09-20 ENCOUNTER — Other Ambulatory Visit (HOSPITAL_COMMUNITY): Payer: Self-pay | Admitting: Interventional Radiology

## 2015-09-20 ENCOUNTER — Ambulatory Visit (HOSPITAL_COMMUNITY)
Admission: RE | Admit: 2015-09-20 | Discharge: 2015-09-20 | Disposition: A | Discharge status: Home / Self Care | Payer: Medicare Other | Source: Ambulatory Visit | Attending: Interventional Radiology | Admitting: Interventional Radiology

## 2015-09-20 DIAGNOSIS — K9413 Enterostomy malfunction: Secondary | ICD-10-CM | POA: Diagnosis not present

## 2015-09-20 DIAGNOSIS — K9423 Gastrostomy malfunction: Secondary | ICD-10-CM

## 2015-09-20 DIAGNOSIS — Y733 Surgical instruments, materials and gastroenterology and urology devices (including sutures) associated with adverse incidents: Secondary | ICD-10-CM | POA: Insufficient documentation

## 2015-09-20 MED ORDER — IOHEXOL 300 MG/ML  SOLN
50.0000 mL | Freq: Once | INTRAMUSCULAR | Status: DC | PRN
  Administered 2015-09-20: 15 mL via INTRAVENOUS
  Filled 2015-09-20: qty 50

## 2015-10-01 DIAGNOSIS — J939 Pneumothorax, unspecified: Secondary | ICD-10-CM

## 2015-10-01 HISTORY — DX: Pneumothorax, unspecified: J93.9

## 2015-10-10 ENCOUNTER — Encounter (HOSPITAL_COMMUNITY): Payer: Self-pay | Admitting: *Deleted

## 2015-10-10 ENCOUNTER — Emergency Department (HOSPITAL_COMMUNITY): Payer: Medicare Other

## 2015-10-10 ENCOUNTER — Observation Stay (HOSPITAL_COMMUNITY)
Admission: EM | Admit: 2015-10-10 | Discharge: 2015-10-13 | Disposition: A | Discharge status: Home Health | Payer: Medicare Other | Attending: Internal Medicine | Admitting: Internal Medicine

## 2015-10-10 DIAGNOSIS — Z23 Encounter for immunization: Secondary | ICD-10-CM | POA: Diagnosis not present

## 2015-10-10 DIAGNOSIS — Z950 Presence of cardiac pacemaker: Secondary | ICD-10-CM | POA: Diagnosis not present

## 2015-10-10 DIAGNOSIS — I482 Chronic atrial fibrillation, unspecified: Secondary | ICD-10-CM | POA: Insufficient documentation

## 2015-10-10 DIAGNOSIS — Z8249 Family history of ischemic heart disease and other diseases of the circulatory system: Secondary | ICD-10-CM | POA: Insufficient documentation

## 2015-10-10 DIAGNOSIS — D696 Thrombocytopenia, unspecified: Secondary | ICD-10-CM | POA: Diagnosis not present

## 2015-10-10 DIAGNOSIS — R64 Cachexia: Secondary | ICD-10-CM | POA: Diagnosis not present

## 2015-10-10 DIAGNOSIS — E119 Type 2 diabetes mellitus without complications: Secondary | ICD-10-CM | POA: Insufficient documentation

## 2015-10-10 DIAGNOSIS — Z681 Body mass index (BMI) 19 or less, adult: Secondary | ICD-10-CM | POA: Insufficient documentation

## 2015-10-10 DIAGNOSIS — E039 Hypothyroidism, unspecified: Secondary | ICD-10-CM | POA: Diagnosis present

## 2015-10-10 DIAGNOSIS — I10 Essential (primary) hypertension: Secondary | ICD-10-CM | POA: Diagnosis not present

## 2015-10-10 DIAGNOSIS — J939 Pneumothorax, unspecified: Secondary | ICD-10-CM | POA: Diagnosis present

## 2015-10-10 DIAGNOSIS — E785 Hyperlipidemia, unspecified: Secondary | ICD-10-CM | POA: Diagnosis not present

## 2015-10-10 DIAGNOSIS — E43 Unspecified severe protein-calorie malnutrition: Secondary | ICD-10-CM | POA: Diagnosis present

## 2015-10-10 DIAGNOSIS — Z931 Gastrostomy status: Secondary | ICD-10-CM | POA: Insufficient documentation

## 2015-10-10 DIAGNOSIS — D61818 Other pancytopenia: Secondary | ICD-10-CM | POA: Insufficient documentation

## 2015-10-10 DIAGNOSIS — F17201 Nicotine dependence, unspecified, in remission: Secondary | ICD-10-CM

## 2015-10-10 DIAGNOSIS — I4892 Unspecified atrial flutter: Secondary | ICD-10-CM | POA: Diagnosis present

## 2015-10-10 DIAGNOSIS — Z87891 Personal history of nicotine dependence: Secondary | ICD-10-CM | POA: Insufficient documentation

## 2015-10-10 DIAGNOSIS — R1310 Dysphagia, unspecified: Secondary | ICD-10-CM | POA: Diagnosis not present

## 2015-10-10 DIAGNOSIS — F1010 Alcohol abuse, uncomplicated: Secondary | ICD-10-CM | POA: Diagnosis not present

## 2015-10-10 DIAGNOSIS — Z8505 Personal history of malignant neoplasm of liver: Secondary | ICD-10-CM | POA: Diagnosis not present

## 2015-10-10 DIAGNOSIS — K9422 Gastrostomy infection: Secondary | ICD-10-CM

## 2015-10-10 DIAGNOSIS — Z7982 Long term (current) use of aspirin: Secondary | ICD-10-CM | POA: Diagnosis not present

## 2015-10-10 DIAGNOSIS — J9383 Other pneumothorax: Secondary | ICD-10-CM

## 2015-10-10 DIAGNOSIS — T85598A Other mechanical complication of other gastrointestinal prosthetic devices, implants and grafts, initial encounter: Secondary | ICD-10-CM

## 2015-10-10 DIAGNOSIS — K9423 Gastrostomy malfunction: Secondary | ICD-10-CM

## 2015-10-10 DIAGNOSIS — C220 Liver cell carcinoma: Secondary | ICD-10-CM | POA: Diagnosis present

## 2015-10-10 DIAGNOSIS — J449 Chronic obstructive pulmonary disease, unspecified: Secondary | ICD-10-CM | POA: Insufficient documentation

## 2015-10-10 DIAGNOSIS — F1011 Alcohol abuse, in remission: Secondary | ICD-10-CM | POA: Diagnosis present

## 2015-10-10 HISTORY — DX: Pneumothorax, unspecified: J93.9

## 2015-10-10 LAB — BASIC METABOLIC PANEL
Anion gap: 11 (ref 5–15)
BUN: 30 mg/dL — AB (ref 6–20)
CO2: 32 mmol/L (ref 22–32)
CREATININE: 0.77 mg/dL (ref 0.61–1.24)
Calcium: 9.7 mg/dL (ref 8.9–10.3)
Chloride: 99 mmol/L — ABNORMAL LOW (ref 101–111)
GFR calc Af Amer: >60 mL/min (ref >60)
GLUCOSE: 183 mg/dL — AB (ref 65–99)
Potassium: 4.6 mmol/L (ref 3.5–5.1)
SODIUM: 142 mmol/L (ref 135–145)

## 2015-10-10 LAB — CBC
HCT: 47 % (ref 39.0–52.0)
Hemoglobin: 15.8 g/dL (ref 13.0–17.0)
MCH: 32.3 pg (ref 26.0–34.0)
MCHC: 33.6 g/dL (ref 30.0–36.0)
MCV: 96.1 fL (ref 78.0–100.0)
PLATELETS: 94 10*3/uL — AB (ref 150–400)
RBC: 4.89 MIL/uL (ref 4.22–5.81)
RDW: 13.4 % (ref 11.5–15.5)
WBC: 6.3 10*3/uL (ref 4.0–10.5)

## 2015-10-10 LAB — I-STAT TROPONIN, ED: TROPONIN I, POC: 0.03 ng/mL (ref 0.00–0.08)

## 2015-10-10 MED ORDER — SENNOSIDES-DOCUSATE SODIUM 8.6-50 MG PO TABS: 1.0000 | ORAL_TABLET | Freq: Every evening | ORAL | Status: DC | PRN

## 2015-10-10 MED ORDER — ONDANSETRON HCL 4 MG PO TABS: 4.0000 mg | ORAL_TABLET | Freq: Four times a day (QID) | ORAL | Status: DC | PRN

## 2015-10-10 MED ORDER — LEVOTHYROXINE SODIUM 100 MCG PO TABS
100.0000 ug | ORAL_TABLET | Freq: Every day | ORAL | Status: DC
  Administered 2015-10-11 – 2015-10-13 (×3): 100 ug via ORAL
  Filled 2015-10-10 (×3): qty 1

## 2015-10-10 MED ORDER — ACETAMINOPHEN 325 MG PO TABS: 650.0000 mg | ORAL_TABLET | Freq: Four times a day (QID) | ORAL | Status: DC | PRN

## 2015-10-10 MED ORDER — GUAIFENESIN 100 MG/5ML PO SOLN: 15.0000 mL | Freq: Four times a day (QID) | ORAL | Status: DC | PRN

## 2015-10-10 MED ORDER — TIOTROPIUM BROMIDE MONOHYDRATE 18 MCG IN CAPS
18.0000 ug | ORAL_CAPSULE | Freq: Two times a day (BID) | RESPIRATORY_TRACT | Status: DC
  Administered 2015-10-10 – 2015-10-13 (×5): 18 ug via RESPIRATORY_TRACT
  Filled 2015-10-10: qty 5

## 2015-10-10 MED ORDER — OSMOLITE 1.5 CAL PO LIQD
1000.0000 mL | ORAL | Status: DC
  Filled 2015-10-10 (×3): qty 1000

## 2015-10-10 MED ORDER — FREE WATER
200.0000 mL | Freq: Three times a day (TID) | Status: DC
  Administered 2015-10-10 – 2015-10-13 (×8): 200 mL

## 2015-10-10 MED ORDER — ASPIRIN 81 MG PO CHEW
324.0000 mg | CHEWABLE_TABLET | Freq: Once | ORAL | Status: DC
  Filled 2015-10-10: qty 4

## 2015-10-10 MED ORDER — BISACODYL 10 MG RE SUPP
10.0000 mg | Freq: Every day | RECTAL | Status: DC | PRN
Start: 2015-10-10 — End: 2015-10-13

## 2015-10-10 MED ORDER — NITROGLYCERIN 0.4 MG SL SUBL
0.4000 mg | SUBLINGUAL_TABLET | SUBLINGUAL | Status: DC | PRN
  Filled 2015-10-10: qty 1

## 2015-10-10 MED ORDER — JEVITY 1.2 CAL PO LIQD
1000.0000 mL | ORAL | Status: DC
  Administered 2015-10-10: 1000 mL
  Filled 2015-10-10 (×2): qty 1000

## 2015-10-10 MED ORDER — IPRATROPIUM-ALBUTEROL 0.5-2.5 (3) MG/3ML IN SOLN
3.0000 mL | RESPIRATORY_TRACT | Status: DC | PRN
  Administered 2015-10-12: 3 mL via RESPIRATORY_TRACT
  Filled 2015-10-10: qty 3

## 2015-10-10 MED ORDER — ONDANSETRON HCL 4 MG/2ML IJ SOLN: 4.0000 mg | Freq: Four times a day (QID) | INTRAMUSCULAR | Status: DC | PRN

## 2015-10-10 MED ORDER — MOMETASONE FURO-FORMOTEROL FUM 100-5 MCG/ACT IN AERO
2.0000 | INHALATION_SPRAY | Freq: Two times a day (BID) | RESPIRATORY_TRACT | Status: DC
  Administered 2015-10-10 – 2015-10-13 (×5): 2 via RESPIRATORY_TRACT
  Filled 2015-10-10: qty 8.8

## 2015-10-10 MED ORDER — ACETAMINOPHEN 650 MG RE SUPP: 650.0000 mg | Freq: Four times a day (QID) | RECTAL | Status: DC | PRN

## 2015-10-10 MED ORDER — ASPIRIN EC 81 MG PO TBEC
81.0000 mg | DELAYED_RELEASE_TABLET | Freq: Every day | ORAL | Status: DC
  Administered 2015-10-10 – 2015-10-13 (×4): 81 mg via ORAL
  Filled 2015-10-10 (×3): qty 1

## 2015-10-10 MED ORDER — OXYCODONE HCL 5 MG PO TABS
15.0000 mg | ORAL_TABLET | ORAL | Status: DC | PRN
  Filled 2015-10-10: qty 3

## 2015-10-10 MED ORDER — MIRTAZAPINE 15 MG PO TABS
15.0000 mg | ORAL_TABLET | Freq: Every day | ORAL | Status: DC
  Administered 2015-10-10 – 2015-10-12 (×3): 15 mg
  Filled 2015-10-10 (×4): qty 1

## 2015-10-10 MED ORDER — JEVITY 1.2 CAL PO LIQD: 1000.0000 mL | ORAL | Status: DC

## 2015-10-10 MED ORDER — SODIUM CHLORIDE 0.9 % IJ SOLN
3.0000 mL | Freq: Two times a day (BID) | INTRAMUSCULAR | Status: DC
  Administered 2015-10-10 – 2015-10-13 (×2): 3 mL via INTRAVENOUS

## 2015-10-10 MED ORDER — SODIUM CHLORIDE 0.9 % IV SOLN
INTRAVENOUS | Status: AC
  Administered 2015-10-10: 16:00:00 via INTRAVENOUS

## 2015-10-10 MED ORDER — ALUM & MAG HYDROXIDE-SIMETH 200-200-20 MG/5ML PO SUSP: 30.0000 mL | Freq: Four times a day (QID) | ORAL | Status: DC | PRN

## 2015-10-10 NOTE — Progress Notes (Addendum)
[facility].  Consult also stated pt needed nursing home placement- per note pt is independent and does not appear to have any skillable needs at this time (wound care, PT/OT, IV meds etc)  CSW informed RNCM of consult and can assess if pt is appropriate for home health social work referral when ready for DC.  CSW signing off- please reconsult if needed  Domenica Reamer, [place] Worker [phone]

## 2015-10-10 NOTE — ED Notes (Signed)
Pt reports left side chest pain since yesterday. ekg done at triage, no acute distress noted.

## 2015-10-10 NOTE — Consult Note (Signed)
[address]       [place] [address]             [phone]           [address]       [place], [address]             [phone]        [name] [place] Medical Record #[id number] Date of Birth: 1931-05-12  Referring: No ref. provider found Primary Care: ANTHONY,ARENNETTE, NP  Chief Complaint:    Chief Complaint  Patient presents with  . Chest Pain    History of Present Illness:    This is an 81 y o male who presented to ER this morning after awakening at approximate 5:30 AM with left-sided chest pain. The pain was a dull aching-type sensation which worsened with deep breathing. He described the pain as pretty severe. Chest x-ray in the emergency department revealed a small left-sided pneumothorax and he was admitted for further evaluation and treatment to include cardiothoracic surgical consultation. The patient has extensive past medical history including severe COPD and recurrent aspiration pneumonia.    Current Activity/ Functional Status: Patient is independent with mobility/ambulation, transfers, ADL's, IADL's.   Zubrod Score: At the time of surgery this patient's most appropriate activity status/level should be described as: []     0    Normal activity, no symptoms [x]     1    Restricted in physical strenuous activity but ambulatory, able to do out light work []     2    Ambulatory and capable of self care, unable to do work activities, up and about                 more than 50%  Of the time                            []     3    Only limited self care, in bed greater than 50% of waking hours []     4    Completely disabled, no self care, confined to bed or chair []     5    Moribund  Past Medical History  Diagnosis Date  . DM2 (diabetes mellitus, type 2) ([place])   . Hyperlipidemia   . Hypertension   . Cerebral vascular disease   . Ider (hepatocellular carcinoma) ([place])     s/p right lobe resection 2012  . Blood  dyscrasia     THROMBCYTOPATHIA  . Peripheral vascular disease ([place])     LICA stent [id number]  . Pneumonia 11/03/2012    aspiration pneumonias  . COPD (chronic obstructive pulmonary disease) ([facility])     [place] C  . Thrombocytopenia ([place])   . Severe malnutrition ([place])   . Dysphagia     s/p PEG placement  . Paroxysmal atrial flutter ([facility])     and fibrillation with known bradycardia, Chads2vasc of 4, not on A/C due to thrombocytopenia and functional status  . Dissecting aortic aneurysm, abdominal ([place]) 11/13/2011    Overview:  Severe aortoiliac stenosis with possible a chronic dissection versus  ulcerated plaque/thrombus extending above and below the aortic  bifurcation; without aneurysmal dilation  as per CT abdomen noted 09/25/2011     Past Surgical History  Procedure Laterality Date  . Carotid stent  2002  . Liver canc    . Posterior cervical fusion/foraminotomy  09/25/2012  Procedure: POSTERIOR CERVICAL FUSION/FORAMINOTOMY LEVEL 2;  Surgeon: Eustace Moore, MD;  Location: [facility];  Service: Neurosurgery;  Laterality: N/A;  Posterior Cervical three-four laminectomy, posterior cervical three-four, four-five fusion  . Esophagogastroduodenoscopy  10/01/2012    Procedure: ESOPHAGOGASTRODUODENOSCOPY (EGD);  Surgeon: Gwenyth Ober, MD;  Location: [facility];  Service: General;  Laterality: N/A;  wyatt/leone  . Peg placement  10/01/2012    Procedure: PERCUTANEOUS ENDOSCOPIC GASTROSTOMY (PEG) PLACEMENT;  Surgeon: Gwenyth Ober, MD;  Location: [facility];  Service: General;  Laterality: N/A;  . Esophagogastroduodenoscopy  10/31/2012    Procedure: ESOPHAGOGASTRODUODENOSCOPY (EGD);  Surgeon: Beryle Beams, MD;  Location: [facility];  Service: Endoscopy;  Laterality: N/A;  . Carotid angiogram  2012  . Doppler echocardiography  2011    History  Smoking status  . Former Smoker -- 0.00 packs/day for 50 years  . Types: Cigarettes  . Quit date: 01/01/2012  Smokeless tobacco  . Never Used      History  Alcohol Use  . Yes    Comment: 3 beers per week    Social History   Social History  . Marital Status: Widowed    Spouse Name: N/A  . Number of Children: 2  . Years of Education: N/A   Occupational History  .     Social History Main Topics  . Smoking status: Former Smoker -- 0.00 packs/day for 50 years    Types: Cigarettes    Quit date: 01/01/2012  . Smokeless tobacco: Never Used  . Alcohol Use: Yes     Comment: 3 beers per week  . Drug Use: No  . Sexual Activity: Not on file   Other Topics Concern  . Not on file   Social History Narrative   Lives with male friend who is also a caretaker.      No Known Allergies  Current Facility-Administered Medications  Medication Dose Route Frequency Provider Last Rate Last Dose  . 0.9 %  sodium chloride infusion   Intravenous Continuous Bobby Rumpf York, PA-C      . acetaminophen (TYLENOL) tablet 650 mg  650 mg Oral Q6H PRN Melton Alar, PA-C       Or  . acetaminophen (TYLENOL) suppository 650 mg  650 mg Rectal Q6H PRN Melton Alar, PA-C      . alum & mag hydroxide-simeth (MAALOX/MYLANTA) 200-200-20 MG/5ML suspension 30 mL  30 mL Oral Q6H PRN Melton Alar, PA-C      . aspirin EC tablet 81 mg  81 mg Oral Daily Marianne L York, PA-C      . bisacodyl (DULCOLAX) suppository 10 mg  10 mg Rectal Daily PRN Melton Alar, PA-C      . feeding supplement (OSMOLITE 1.5 CAL) liquid 1,000 mL  1,000 mL Per Tube Continuous Melton Alar, PA-C      . free water 200 mL  200 mL Per Tube 3 times per day Melton Alar, PA-C      . guaiFENesin (ROBITUSSIN) 100 MG/5ML solution 300 mg  15 mL Per Tube Q6H PRN Melton Alar, PA-C      . ipratropium-albuterol (DUONEB) 0.5-2.5 (3) MG/3ML nebulizer solution 3 mL  3 mL Nebulization Q2H PRN Melton Alar, PA-C      . [START ON 10/11/2015] levothyroxine (SYNTHROID, LEVOTHROID) tablet 100 mcg  100 mcg Oral QAC breakfast Melton Alar, PA-C      . mirtazapine (REMERON) tablet 15  mg  15 mg Per Tube  QHS Melton Alar, PA-C      . mometasone-formoterol (DULERA) 100-5 MCG/ACT inhaler 2 puff  2 puff Inhalation BID Melton Alar, PA-C      . ondansetron (ZOFRAN) tablet 4 mg  4 mg Oral Q6H PRN Melton Alar, PA-C       Or  . ondansetron (ZOFRAN) injection 4 mg  4 mg Intravenous Q6H PRN Melton Alar, PA-C      . oxyCODONE (Oxy IR/ROXICODONE) immediate release tablet 15 mg  15 mg Oral Q4H PRN Melton Alar, PA-C      . senna-docusate (Senokot-S) tablet 1 tablet  1 tablet Oral QHS PRN Melton Alar, PA-C      . sodium chloride 0.9 % injection 3 mL  3 mL Intravenous Q12H Marianne L York, PA-C      . tiotropium (SPIRIVA) inhalation capsule 18 mcg  18 mcg Inhalation BID Melton Alar, PA-C        Prescriptions prior to admission  Medication Sig Dispense Refill Last Dose  . albuterol (PROVENTIL) (2.5 MG/3ML) 0.083% nebulizer solution Take 3 mLs (2.5 mg total) by nebulization every 6 (six) hours as needed for wheezing or shortness of breath. 75 mL 12 Past Month at Unknown time  . aspirin EC 81 MG tablet Take 81 mg by mouth daily.   Past Week at Unknown time  . ATROVENT HFA 17 MCG/ACT inhaler Inhale 2 puffs into the lungs every 6 (six) hours as needed for wheezing.    Past Week at Unknown time  . budesonide (PULMICORT) 0.25 MG/2ML nebulizer solution Take 0.25 mg by nebulization every 6 (six) hours.    10/10/2015 at Unknown time  . cyproheptadine (PERIACTIN) 4 MG tablet Take 4 mg by mouth daily.   10/09/2015 at Unknown time  . levothyroxine (SYNTHROID, LEVOTHROID) 100 MCG tablet Take 100 mcg by mouth daily.   10/10/2015 at Unknown time  . linagliptin (TRADJENTA) 5 MG TABS tablet Take 5 mg by mouth daily.    10/09/2015 at Unknown time  . mirtazapine (REMERON) 30 MG tablet Take 0.5 tablets (15 mg total) by mouth at bedtime. 30 tablet 2 10/09/2015 at Unknown time  . mometasone-formoterol (DULERA) 100-5 MCG/ACT AERO Inhale 2 puffs into the lungs 2 (two) times daily. 1 Inhaler 2  Past Month at Unknown time  . Nutritional Supplements (FEEDING SUPPLEMENT, OSMOLITE 1.5 CAL,) LIQD Place 1,000 mLs into feeding tube continuous. 1000 mL 0 Past Week at Unknown time  . oxyCODONE (ROXICODONE) 15 MG immediate release tablet Take 1 tablet by mouth every 4 (four) hours.  0 Past Week at Unknown time  . oxyCODONE (ROXICODONE) 5 MG immediate release tablet Take 1 tablet (5 mg total) by mouth every 4 (four) hours as needed for severe pain. (Patient taking differently: Take 15 mg by mouth every 4 (four) hours as needed for severe pain. ) 15 tablet 0 Past Week at Unknown time  . tiotropium (SPIRIVA) 18 MCG inhalation capsule Place 18 mcg into inhaler and inhale 2 (two) times daily.   10/09/2015 at Unknown time  . Water For Irrigation, Sterile (FREE WATER) SOLN Place 200 mLs into feeding tube every 8 (eight) hours.   Past Month at Unknown time    Family History  Problem Relation Age of Onset  . CAD Neg Hx   . Cancer Father     throat, died young  . Liver disease Brother      Review of Systems:      Cardiac Review of  Systems: Y or N  Chest Pain [  y  ]  Resting SOB [   ] Exertional SOB  [ = ]  Orthopnea [  ]   Pedal Edema [   ]    Palpitations [  ] Syncope  [  ]   Presyncope [   ]  General Review of Systems: [Y] = yes [  ]=no Constitional: recent weight change [  ]; anorexia [+  ]; fatigue [ + ]; nausea [  ]; night sweats [  ]; fever [  ]; or chills [  ]                                                               Dental: poor dentition[  ]; Last Dentist visit: edebtulous  Eye : blurred vision [  ]; diplopia [   ]; vision changes [  ];  Amaurosis fugax[  ]; Resp: cough [  ];  wheezing[  ];  hemoptysis[  ]; shortness of breath[+  ]; paroxysmal nocturnal dyspnea[  ]; dyspnea on exertion[ +]; or orthopnea[  ];  GI:  gallstones[  ], vomiting[  ];  Dysphagia[+  ]; melena[  ];  hematochezia [  ]; heartburn[  ];   Hx of  Colonoscopy[  ]; GU: kidney stones [  ]; hematuria[  ];   dysuria [   ];  nocturia[  ];  history of     obstruction [  ]; urinary frequency [  ]             Skin: rash, swelling[  ];, hair loss[  ];  peripheral edema[  ];  or itching[  ];dry skin Musculosketetal: myalgias[  ];  joint swelling[  ];  joint erythema[  ];  joint pain[  ];  back pain[ + ];  Heme/Lymph: bruising[  ];  bleeding[  ];  anemia[  ];  Neuro: TIA[  ];  headaches[  ];  stroke[  ];  vertigo[  ];  seizures[  ];   paresthesias[  ];  difficulty walking[  ];  Psych:depression[  ]; anxiety[  ];  Endocrine: diabetes[  ];  thyroid dysfunction[  ];  Immunizations: Flu [  ]; Pneumococcal[  ];  Other:tube fed, does eat some soft food Recently quit smoking and ETOH  Physical Exam: BP 159/80 mmHg  Pulse 51  Temp(Src) 98.1 F (36.7 C) (Oral)  Resp 19  SpO2 100%   General appearance: alert, cooperative, cachectic, distracted and no distress Head: Normocephalic, without obvious abnormality, atraumatic Neck: no adenopathy, no carotid bruit, no JVD, supple, symmetrical, trachea midline and thyroid not enlarged, symmetric, no tenderness/mass/nodules Lymph nodes: Cervical, supraclavicular, and axillary nodes normal. Resp: dimin in lower fields, fair exchange throughout Back: symmetric, no curvature. ROM normal. No CVA tenderness. Cardio: irregularly irregular rhythm GI: soft, non-tender; bowel sounds normal; no masses,  no organomegaly Extremities: dp/pt absent, no edema Neurologic: Grossly normal Skin dry, poor turgor Diagnostic Studies & Laboratory data:     Recent Radiology Findings:   Dg Chest 2 View  10/10/2015   CLINICAL DATA:  One day history of left-sided chest pain  EXAM: CHEST  2 VIEW  COMPARISON:  Chest radiograph September 18, 2014 and chest CT June 20, 2015  FINDINGS: There is a  focal pneumothorax at the left base without tension component. There is scarring and fibrosis in the left base. Lungs elsewhere clear. Heart size and pulmonary vascularity are normal. No adenopathy. Pacemaker  lead is attached to the middle cardiac vein. No adenopathy. There is degenerative change in the thoracic spine.  IMPRESSION: Pneumothorax left base without tension component. Scarring/ fibrosis left base. No edema or consolidation. Pacemaker lead attached to middle cardiac vein.  Critical Value/emergent results were called by telephone at the time of interpretation on 10/10/2015 at 12:29 pm to Dr. Sherwood Gambler , who verbally acknowledged these results.   Electronically Signed   By: Lowella Grip III M.D.   On: 10/10/2015 12:30     I have independently reviewed the above radiologic studies.  Recent Lab Findings: Lab Results  Component Value Date   WBC 6.3 10/10/2015   HGB 15.8 10/10/2015   HCT 47.0 10/10/2015   PLT 94* 10/10/2015   GLUCOSE 183* 10/10/2015   CHOL 72 11/03/2012   TRIG 80 11/03/2012   HDL 16* 11/03/2012   LDLCALC 40 11/03/2012   ALT 14 04/01/2015   AST 34 04/01/2015   NA 142 10/10/2015   K 4.6 10/10/2015   CL 99* 10/10/2015   CREATININE 0.77 10/10/2015   BUN 30* 10/10/2015   CO2 32 10/10/2015   TSH 2.960 09/11/2014   INR 1.29 07/11/2013   HGBA1C 6.7* 09/11/2014      Assessment / Plan:  Small left pntx with severe COPD, multiple co-morbidities. Hopefully we can avoid any procedures or surgical intervention     Repeat pa/lat cxr in am     GOLD,WAYNE E, PA-C 10/10/2015 2:52 PM

## 2015-10-10 NOTE — ED Provider Notes (Signed)
CSN: [id number]     Arrival date & time 10/10/15  1058 History   First MD Initiated Contact with Patient 10/10/15 1118     Chief Complaint  Patient presents with  . Chest Pain     (Consider location/radiation/quality/duration/timing/severity/associated sxs/prior Treatment) HPI  81 year old male with a history of A. fib, COPD presents with chest pain since this morning. He states he woke up and noticed chest pain. Has a chronic cough, chronic dyspnea that is not worse than normal. Chest pain is sharp. No exertional component. Was a 63/10, now a 6 without treatment. No prior CAD but does have a pacemaker. No leg swelling.   Past Medical History  Diagnosis Date  . DM2 (diabetes mellitus, type 2) ([place])   . Hyperlipidemia   . Hypertension   . Cerebral vascular disease   . [place] (hepatocellular carcinoma) ([place])     s/p right lobe resection 2012  . Blood dyscrasia     THROMBCYTOPATHIA  . Peripheral vascular disease ([place])     LICA stent [id number]  . Pneumonia 11/03/2012    aspiration pneumonias  . COPD (chronic obstructive pulmonary disease) (HCC)     Gold C  . Thrombocytopenia ([place])   . Severe malnutrition ([place])   . Dysphagia     s/p PEG placement  . Paroxysmal atrial flutter (HCC)     and fibrillation with known bradycardia, Chads2vasc of 4, not on A/C due to thrombocytopenia and functional status  . Dissecting aortic aneurysm, abdominal ([place]) 11/13/2011    Overview:  Severe aortoiliac stenosis with possible a chronic dissection versus  ulcerated plaque/thrombus extending above and below the aortic  bifurcation; without aneurysmal dilation  as per CT abdomen noted 09/25/2011    Past Surgical History  Procedure Laterality Date  . Carotid stent  2002  . Liver canc    . Posterior cervical fusion/foraminotomy  09/25/2012    Procedure: POSTERIOR CERVICAL FUSION/FORAMINOTOMY LEVEL 2;  Surgeon: Eustace Moore, MD;  Location: [facility];  Service: Neurosurgery;  Laterality: N/A;  Posterior  Cervical three-four laminectomy, posterior cervical three-four, four-five fusion  . Esophagogastroduodenoscopy  10/01/2012    Procedure: ESOPHAGOGASTRODUODENOSCOPY (EGD);  Surgeon: Gwenyth Ober, MD;  Location: [facility];  Service: General;  Laterality: N/A;  wyatt/leone  . Peg placement  10/01/2012    Procedure: PERCUTANEOUS ENDOSCOPIC GASTROSTOMY (PEG) PLACEMENT;  Surgeon: Gwenyth Ober, MD;  Location: [facility];  Service: General;  Laterality: N/A;  . Esophagogastroduodenoscopy  10/31/2012    Procedure: ESOPHAGOGASTRODUODENOSCOPY (EGD);  Surgeon: Beryle Beams, MD;  Location: [facility];  Service: Endoscopy;  Laterality: N/A;  . Carotid angiogram  2012  . Doppler echocardiography  2011   Family History  Problem Relation Age of Onset  . CAD Neg Hx   . Cancer Father     throat, died young  . Liver disease Brother    Social History  Substance Use Topics  . Smoking status: Former Smoker -- 0.00 packs/day for 50 years    Types: Cigarettes    Quit date: 01/01/2012  . Smokeless tobacco: Never Used  . Alcohol Use: Yes     Comment: 3 beers per week    Review of Systems  Respiratory: Positive for cough and shortness of breath.   Cardiovascular: Positive for chest pain. Negative for leg swelling.  All other systems reviewed and are negative.     Allergies  Review of patient's allergies indicates no known allergies.  Home Medications   Prior to Admission medications  Medication Sig Start Date End Date Taking? Authorizing Provider  albuterol (PROVENTIL) (2.5 MG/3ML) 0.083% nebulizer solution Take 3 mLs (2.5 mg total) by nebulization every 6 (six) hours as needed for wheezing or shortness of breath. 09/12/14   Oswald Hillock, MD  aspirin EC 81 MG tablet Take 81 mg by mouth daily.    Historical Provider, MD  ATROVENT HFA 17 MCG/ACT inhaler Inhale 2 puffs into the lungs every 6 (six) hours as needed for wheezing.  06/01/15   Historical Provider, MD  budesonide (PULMICORT) 0.25  MG/2ML nebulizer solution Take 0.25 mg by nebulization every 6 (six) hours.  05/24/15   Historical Provider, MD  cyproheptadine (PERIACTIN) 4 MG tablet Take 4 mg by mouth daily. 05/24/15   Historical Provider, MD  levothyroxine (SYNTHROID, LEVOTHROID) 100 MCG tablet Take 100 mcg by mouth daily. 05/31/15   Historical Provider, MD  lidocaine-prilocaine (EMLA) cream Apply 1 application topically once.  03/24/15   Historical Provider, MD  linagliptin (TRADJENTA) 5 MG TABS tablet Take 5 mg by mouth daily.     Historical Provider, MD  mirtazapine (REMERON) 30 MG tablet Take 0.5 tablets (15 mg total) by mouth at bedtime. 09/12/14   Oswald Hillock, MD  mometasone-formoterol (DULERA) 100-5 MCG/ACT AERO Inhale 2 puffs into the lungs 2 (two) times daily. 09/12/14   Oswald Hillock, MD  Nutritional Supplements (FEEDING SUPPLEMENT, OSMOLITE 1.5 CAL,) LIQD Place 1,000 mLs into feeding tube continuous. 10/19/13   Nita Sells, MD  oxyCODONE (ROXICODONE) 5 MG immediate release tablet Take 1 tablet (5 mg total) by mouth every 4 (four) hours as needed for severe pain. 06/21/15   Antonietta Breach, PA-C  tiotropium (SPIRIVA) 18 MCG inhalation capsule Place 18 mcg into inhaler and inhale 2 (two) times daily.    Historical Provider, MD  Water For Irrigation, Sterile (FREE WATER) SOLN Place 200 mLs into feeding tube every 8 (eight) hours. 10/19/13   Nita Sells, MD   BP 123/72 mmHg  Pulse 72  Temp(Src) 97.7 F (36.5 C) (Oral)  Resp 24  SpO2 97% Physical Exam  Constitutional: He is oriented to person, place, and time. He appears well-developed and well-nourished.  Chronically ill appearing  HENT:  Head: Normocephalic and atraumatic.  Right Ear: External ear normal.  Left Ear: External ear normal.  Nose: Nose normal.  Eyes: Right eye exhibits no discharge. Left eye exhibits no discharge.  Neck: Neck supple.  Cardiovascular: Normal rate, regular rhythm, normal heart sounds and intact distal pulses.    Pulmonary/Chest: Effort normal. He has decreased breath sounds (diffusely, bilateral).  Abdominal: Soft. There is no tenderness.  Musculoskeletal: He exhibits no edema.  Neurological: He is alert and oriented to person, place, and time.  Skin: Skin is warm and dry.  Nursing note and vitals reviewed.   ED Course  Procedures (including critical care time) Labs Review Labs Reviewed  BASIC METABOLIC PANEL - Abnormal; Notable for the following:    Chloride 99 (*)    Glucose, Bld 183 (*)    BUN 30 (*)    All other components within normal limits  CBC - Abnormal; Notable for the following:    Platelets 94 (*)    All other components within normal limits  I-STAT TROPOININ, ED    Imaging Review Dg Chest 2 View  10/10/2015   CLINICAL DATA:  One day history of left-sided chest pain  EXAM: CHEST  2 VIEW  COMPARISON:  Chest radiograph September 18, 2014 and chest CT June 20, 2015  FINDINGS: There is a focal pneumothorax at the left base without tension component. There is scarring and fibrosis in the left base. Lungs elsewhere clear. Heart size and pulmonary vascularity are normal. No adenopathy. Pacemaker lead is attached to the middle cardiac vein. No adenopathy. There is degenerative change in the thoracic spine.  IMPRESSION: Pneumothorax left base without tension component. Scarring/ fibrosis left base. No edema or consolidation. Pacemaker lead attached to middle cardiac vein.  Critical Value/emergent results were called by telephone at the time of interpretation on 10/10/2015 at 12:29 pm to Dr. Sherwood Gambler , who verbally acknowledged these results.   Electronically Signed   By: Lowella Grip III M.D.   On: 10/10/2015 12:30   I have personally reviewed and evaluated these images and lab results as part of my medical decision-making.   EKG Interpretation   Date/Time:  Monday October 10 2015 11:37:45 EDT Ventricular Rate:  69 PR Interval:    QRS Duration: 120 QT Interval:  385 QTC  Calculation: 412 R Axis:   -88 Text Interpretation:  Atrial fibrillation Left anterior fascicular block  Anterior infarct, old no significant change since earlier in the day  Confirmed by [place] ([id number]) on 10/10/2015 12:29:30 PM      MDM   Final diagnoses:  Pneumothorax, left    Patient is seen and stable and resting comfortably. Without any acute intervention his chest pain resolved. Likely his chest pain is coming from the small pneumothorax seen in his left lower lobe. No hypoxia but will place on oxygen. Discussed with CT surgery has reviewed the x-ray and recommends admission to the hospitalist overnight with repeat x-ray in the morning or as needed. Does not think it chest would be beneficial at this time. Admit to telemetry.      Sherwood Gambler, MD 10/10/15 [phone]

## 2015-10-10 NOTE — H&P (Signed)
[facility] History and Physical                                                                                    Mat Stuard, is a 81 y.o. male  MRN: [id number]   DOB - 23-Dec-1931  Admit Date - 10/10/2015  Outpatient Primary MD for the patient is ANTHONY,ARENNETTE, NP  Referring Physician:  Dr. Regenia Skeeter  Chief Complaint:   Chief Complaint  Patient presents with  . Chest Pain     HPI  [name]  is a 81 y.o. male, with severe COPD, dysphagia s/p PEG, CVA, recurrent aspiration pneumonia, hepatocellular carcinoma, chronic pancytopenia, type 2 diabetes, and history of dissecting aortic aneurysm.  He reports that he awoke at 5:30 this morning with left-sided chest pain. It did not radiate. It felt like a dull ache. It was worse with deep breathing. It did not improve until he received pain medication in the emergency department. He has never felt pain like this before. He denies any palpitations. He has a cough but is bringing up clear sputum-he reports this is normal for him. He has not seen any hemoptysis.  Chest x-ray the emergency department reveals a small left pneumothorax. Point-of-care troponin is 0.03, EKG shows A. fib without acute ST changes. Platelets are 94, chloride 99, BUN 30. Pulse rate is in the 40s to 60s. Vital signs are otherwise stable.  Review of Systems   In addition to the HPI above,  No Fever-chills, No Headache, No changes with Vision or hearing, ++ Chronic swallowing problems. ++ Progressive dyspnea on exertion. No Abdominal pain, No Nausea or Vomiting, Bowel movements are regular, No Blood in stool or Urine, No dysuria, No new skin rashes or bruises, No new joints pains-aches,  No new weakness, tingling, numbness in any extremity, No recent weight gain or loss, A full 10 point Review of Systems was done, except as stated above, all other Review of Systems were negative.  Past Medical History  Past Medical History  Diagnosis Date  . DM2  (diabetes mellitus, type 2) ([place])   . Hyperlipidemia   . Hypertension   . Cerebral vascular disease   . [place] (hepatocellular carcinoma) ([place])     s/p right lobe resection 2012  . Blood dyscrasia     THROMBCYTOPATHIA  . Peripheral vascular disease ([place])     LICA stent [id number]  . Pneumonia 11/03/2012    aspiration pneumonias  . COPD (chronic obstructive pulmonary disease) (HCC)     Gold C  . Thrombocytopenia ([place])   . Severe malnutrition ([place])   . Dysphagia     s/p PEG placement  . Paroxysmal atrial flutter (HCC)     and fibrillation with known bradycardia, Chads2vasc of 4, not on A/C due to thrombocytopenia and functional status  . Dissecting aortic aneurysm, abdominal ([place]) 11/13/2011    Overview:  Severe aortoiliac stenosis with possible a chronic dissection versus  ulcerated plaque/thrombus extending above and below the aortic  bifurcation; without aneurysmal dilation  as per CT abdomen noted 09/25/2011     Past Surgical History  Procedure Laterality Date  . Carotid stent  2002  . Liver canc    .  Posterior cervical fusion/foraminotomy  09/25/2012    Procedure: POSTERIOR CERVICAL FUSION/FORAMINOTOMY LEVEL 2;  Surgeon: Eustace Moore, MD;  Location: [facility];  Service: Neurosurgery;  Laterality: N/A;  Posterior Cervical three-four laminectomy, posterior cervical three-four, four-five fusion  . Esophagogastroduodenoscopy  10/01/2012    Procedure: ESOPHAGOGASTRODUODENOSCOPY (EGD);  Surgeon: Gwenyth Ober, MD;  Location: [facility];  Service: General;  Laterality: N/A;  wyatt/leone  . Peg placement  10/01/2012    Procedure: PERCUTANEOUS ENDOSCOPIC GASTROSTOMY (PEG) PLACEMENT;  Surgeon: Gwenyth Ober, MD;  Location: [facility];  Service: General;  Laterality: N/A;  . Esophagogastroduodenoscopy  10/31/2012    Procedure: ESOPHAGOGASTRODUODENOSCOPY (EGD);  Surgeon: Beryle Beams, MD;  Location: [facility]COPY;  Service: Endoscopy;  Laterality: N/A;  . Carotid angiogram  2012  . Doppler  echocardiography  2011      Social History Social History  Substance Use Topics  . Smoking status: Former Smoker -- 0.00 packs/day for 50 years    Types: Cigarettes    Quit date: 01/01/2012  . Smokeless tobacco: Never Used  . Alcohol Use: Yes     Comment: 3 beers per week   lives at home alone. Daughter checks on him.  Family History Family History  Problem Relation Age of Onset  . CAD Neg Hx   . Cancer Father     throat, died young  . Liver disease Brother     Prior to Admission medications   Medication Sig Start Date End Date Taking? Authorizing Provider  albuterol (PROVENTIL) (2.5 MG/3ML) 0.083% nebulizer solution Take 3 mLs (2.5 mg total) by nebulization every 6 (six) hours as needed for wheezing or shortness of breath. 09/12/14   Oswald Hillock, MD  aspirin EC 81 MG tablet Take 81 mg by mouth daily.    Historical Provider, MD  ATROVENT HFA 17 MCG/ACT inhaler Inhale 2 puffs into the lungs every 6 (six) hours as needed for wheezing.  06/01/15   Historical Provider, MD  budesonide (PULMICORT) 0.25 MG/2ML nebulizer solution Take 0.25 mg by nebulization every 6 (six) hours.  05/24/15   Historical Provider, MD  cyproheptadine (PERIACTIN) 4 MG tablet Take 4 mg by mouth daily. 05/24/15   Historical Provider, MD  levothyroxine (SYNTHROID, LEVOTHROID) 100 MCG tablet Take 100 mcg by mouth daily. 05/31/15   Historical Provider, MD  lidocaine-prilocaine (EMLA) cream Apply 1 application topically once.  03/24/15   Historical Provider, MD  linagliptin (TRADJENTA) 5 MG TABS tablet Take 5 mg by mouth daily.     Historical Provider, MD  mirtazapine (REMERON) 30 MG tablet Take 0.5 tablets (15 mg total) by mouth at bedtime. 09/12/14   Oswald Hillock, MD  mometasone-formoterol (DULERA) 100-5 MCG/ACT AERO Inhale 2 puffs into the lungs 2 (two) times daily. 09/12/14   Oswald Hillock, MD  Nutritional Supplements (FEEDING SUPPLEMENT, OSMOLITE 1.5 CAL,) LIQD Place 1,000 mLs into feeding tube continuous. 10/19/13    Nita Sells, MD  oxyCODONE (ROXICODONE) 5 MG immediate release tablet Take 1 tablet (5 mg total) by mouth every 4 (four) hours as needed for severe pain. 06/21/15   Antonietta Breach, PA-C  tiotropium (SPIRIVA) 18 MCG inhalation capsule Place 18 mcg into inhaler and inhale 2 (two) times daily.    Historical Provider, MD  Water For Irrigation, Sterile (FREE WATER) SOLN Place 200 mLs into feeding tube every 8 (eight) hours. 10/19/13   Nita Sells, MD    No Known Allergies  Physical Exam  Vitals  Blood pressure 152/58, pulse 49,  temperature 97.7 F (36.5 C), temperature source Oral, resp. rate 24, SpO2 97 %.   General:  Elderly, dirty, cachectic appearing male lying in bed in NAD, edentulous  Psych:  Normal affect and insight, Not Suicidal or Homicidal, Awake Alert, Oriented X 3.  Neuro:   No F.N deficits, ALL C.Nerves Intact, Strength 5/5 all 4 extremities, Sensation intact all 4 extremities.  ENT:  Ears and Eyes appear Normal, Conjunctivae clear, PER  dry oral mucosa  Neck:  Supple, No lymphadenopathy appreciated  Respiratory:  Decreased respiratory sounds at the bases, minimal wheezes, no crackles or rales. Normal breath sounds in upper lobes.  Cardiac:  Bradycardic, irregularly irregular, No Murmurs, no LE edema noted, no JVD.    Abdomen:  Positive bowel sounds, Soft, Non tender, Non distended,  No masses appreciated. Thickened skin, PEG in place  Skin:  No Cyanosis, Normal Skin Turgor, No Skin Rash or Bruise.  Extremities:  Able to move all 4. 5/5 strength in each,  no effusions.  Data Review  Wt Readings from Last 3 Encounters:  06/20/15 57.607 kg (127 lb)  04/01/15 56.972 kg (125 lb 9.6 oz)  09/18/14 55.792 kg (123 lb)    CBC  Recent Labs Lab 10/10/15 1138  WBC 6.3  HGB 15.8  HCT 47.0  PLT 94*  MCV 96.1  MCH 32.3  MCHC 33.6  RDW 13.4    Chemistries   Recent Labs Lab 10/10/15 1138  NA 142  K 4.6  CL 99*  CO2 32  GLUCOSE 183*  BUN 30*   CREATININE 0.77  CALCIUM 9.7    Imaging results:   Dg Chest 2 View  10/10/2015   CLINICAL DATA:  One day history of left-sided chest pain  EXAM: CHEST  2 VIEW  COMPARISON:  Chest radiograph September 18, 2014 and chest CT June 20, 2015  FINDINGS: There is a focal pneumothorax at the left base without tension component. There is scarring and fibrosis in the left base. Lungs elsewhere clear. Heart size and pulmonary vascularity are normal. No adenopathy. Pacemaker lead is attached to the middle cardiac vein. No adenopathy. There is degenerative change in the thoracic spine.  IMPRESSION: Pneumothorax left base without tension component. Scarring/ fibrosis left base. No edema or consolidation. Pacemaker lead attached to middle cardiac vein.  Critical Value/emergent results were called by telephone at the time of interpretation on 10/10/2015 at 12:29 pm to Dr. Sherwood Gambler , who verbally acknowledged these results.   Electronically Signed   By: Lowella Grip III M.D.   On: 10/10/2015 12:30   Ir Gj Tube Change  09/20/2015   CLINICAL DATA:  Damaged leaking gastrojejunostomy. Chronic tube feeds.  EXAM: FLUOROSCOPIC EXCHANGE OF THE 24 FRENCH GASTROJEJUNOSTOMY  Date:  9/20/20169/20/2016 3:39 pm  Radiologist:  M. Daryll Brod, MD  Guidance:  Fluoroscopic  FLUOROSCOPY TIME:  2 minutes 30 seconds, 54 mGy  MEDICATIONS AND MEDICAL HISTORY: None.  ANESTHESIA/SEDATION: None  CONTRAST:  6mL OMNIPAQUE IOHEXOL 300 MG/ML  SOLN  COMPLICATIONS: None immediate  PROCEDURE: Informed consent was obtained from the patient following explanation of the procedure, risks, benefits and alternatives. The patient understands, agrees and consents for the procedure. All questions were addressed. A time out was performed.  Maximal barrier sterile technique utilized including caps, mask, sterile gowns, sterile gloves, large sterile drape, hand hygiene, and Betadine.  Under sterile conditions, the existing damaged GJ tube was cut and  removed over a Glidewire. A C2 catheter and Glidewire were utilized to manipulate the  access into the proximal jejunum. Over the glidewire, a new 24 [place] GJ tube was advanced. Retention balloon inflated with 9 cc saline and retracted against the anterior gastric wall. Contrast injection confirms position. Images obtained for documentation. Feeding tube ready for use.  IMPRESSION: Successful fluoroscopic exchange of the 24 French gastrojejunostomy ready for use.   Electronically Signed   By: Jerilynn Mages.  Shick M.D.   On: 09/20/2015 16:33    My personal review of EKG: A. fib, Bradycardic   Assessment & Plan  Principal Problem:   Pneumothorax, left Active Problems:   HCC (hepatocellular carcinoma), surgery [facility] March 2012   Hypertension   Hypothyroid   Alcohol abuse, in remission   Atrial flutter, recurrent, history of RFA in 2006, at times brady.   Protein-calorie malnutrition, severe (HCC)   Tobacco abuse, in remission   Thrombocytopenia ([place])    Left Pneumothorax Likely spontaneous in the setting of severe COPD. Cardiothoracic surgery has been consult and will see the patient this evening. CTS recommends observation overnight with oxygen and follow-up chest x-ray in the a.m. Will leave on continuous pulse ox. If his oxygen sats drop suddenly check x-ray stat.  Severe COPD Currently stable. Not wheezing. Continue home medications DuoNeb's, Spiriva and Dulera. (Holding Pulmicort as it appeared to be a duplicate medication.)  Dysphagia Status post PEG. Takes Jevity as well as full liquid/pured diet. Speech evaluation requested.  Aspiration precautions  Atrial fibrillation Rate controlled. Not on anticoagulation due to chronic thrombocytopenia  Protein calorie malnutrition Nutrition consultation requested.  Hypothyroidism Continue Synthroid  Social Patient is cachectic, dirty, lives alone. We will ask for social work and case management to offer appropriate resources. PT eval  requested.  Consultants Called:  Cardio-thoracic surgery.  Family Communication:   [place], dtr via phone.  Code Status:  Full code. Patient does not want long-term life support. Daughter will have further conversations with patient about CODE STATUS.   Condition:  Guarded  Potential Disposition: To be determined.  Patient does not appear able to care for himself at home. Time spent in minutes : [place],  [place] on 10/10/2015 at 1:50 PM Between 7am to 7pm - Pager - [phone] After 7pm go to [email or web address] - password [email or web address] And look for the night coverage person covering me after hours

## 2015-10-11 ENCOUNTER — Observation Stay (HOSPITAL_COMMUNITY): Payer: Medicare Other

## 2015-10-11 DIAGNOSIS — I10 Essential (primary) hypertension: Secondary | ICD-10-CM | POA: Diagnosis not present

## 2015-10-11 DIAGNOSIS — I482 Chronic atrial fibrillation, unspecified: Secondary | ICD-10-CM | POA: Insufficient documentation

## 2015-10-11 DIAGNOSIS — J9383 Other pneumothorax: Secondary | ICD-10-CM

## 2015-10-11 DIAGNOSIS — J939 Pneumothorax, unspecified: Secondary | ICD-10-CM | POA: Diagnosis not present

## 2015-10-11 DIAGNOSIS — E039 Hypothyroidism, unspecified: Secondary | ICD-10-CM | POA: Diagnosis not present

## 2015-10-11 DIAGNOSIS — K9423 Gastrostomy malfunction: Secondary | ICD-10-CM | POA: Diagnosis not present

## 2015-10-11 LAB — BASIC METABOLIC PANEL
ANION GAP: 6 (ref 5–15)
BUN: 23 mg/dL — ABNORMAL HIGH (ref 6–20)
CO2: 28 mmol/L (ref 22–32)
Calcium: 8.4 mg/dL — ABNORMAL LOW (ref 8.9–10.3)
Chloride: 100 mmol/L — ABNORMAL LOW (ref 101–111)
Creatinine, Ser: 0.71 mg/dL (ref 0.61–1.24)
GFR calc non Af Amer: >60 mL/min (ref >60)
Glucose, Bld: 219 mg/dL — ABNORMAL HIGH (ref 65–99)
POTASSIUM: 4 mmol/L (ref 3.5–5.1)
SODIUM: 134 mmol/L — AB (ref 135–145)

## 2015-10-11 LAB — CBC
HCT: 40.3 % (ref 39.0–52.0)
Hemoglobin: 13.6 g/dL (ref 13.0–17.0)
MCH: 32.2 pg (ref 26.0–34.0)
MCHC: 33.7 g/dL (ref 30.0–36.0)
MCV: 95.5 fL (ref 78.0–100.0)
PLATELETS: 65 10*3/uL — AB (ref 150–400)
RBC: 4.22 MIL/uL (ref 4.22–5.81)
RDW: 13.5 % (ref 11.5–15.5)
WBC: 6.2 10*3/uL (ref 4.0–10.5)

## 2015-10-11 LAB — GLUCOSE, CAPILLARY
GLUCOSE-CAPILLARY: 132 mg/dL — AB (ref 65–99)
Glucose-Capillary: 105 mg/dL — ABNORMAL HIGH (ref 65–99)
Glucose-Capillary: 158 mg/dL — ABNORMAL HIGH (ref 65–99)

## 2015-10-11 MED ORDER — VITAL HIGH PROTEIN PO LIQD: 1000.0000 mL | ORAL | Status: DC

## 2015-10-11 MED ORDER — JEVITY 1.2 CAL PO LIQD
1000.0000 mL | ORAL | Status: DC
  Administered 2015-10-11 – 2015-10-13 (×3): 1000 mL
  Filled 2015-10-11 (×3): qty 1000

## 2015-10-11 MED ORDER — IOHEXOL 300 MG/ML  SOLN
50.0000 mL | Freq: Once | INTRAMUSCULAR | Status: DC | PRN
  Administered 2015-10-11: 50 mL via INTRATHECAL
  Filled 2015-10-11: qty 50

## 2015-10-11 MED ORDER — PNEUMOCOCCAL VAC POLYVALENT 25 MCG/0.5ML IJ INJ
0.5000 mL | INJECTION | INTRAMUSCULAR | Status: AC
  Administered 2015-10-13: 0.5 mL via INTRAMUSCULAR
  Filled 2015-10-11 (×2): qty 0.5

## 2015-10-11 MED ORDER — INFLUENZA VAC SPLIT QUAD 0.5 ML IM SUSY
0.5000 mL | PREFILLED_SYRINGE | INTRAMUSCULAR | Status: AC
  Administered 2015-10-13: 0.5 mL via INTRAMUSCULAR
  Filled 2015-10-11 (×2): qty 0.5

## 2015-10-11 NOTE — Progress Notes (Addendum)
[address]       [place] [address]             [phone]          Subjective: CXR is improved, sats 100 % on 2 liters He feels ok, not SOB  Objective: Vital signs in last 24 hours: Temp:  [97.7 F (36.5 C)-98.1 F (36.7 C)] 97.7 F (36.5 C) (10/11 0309) Pulse Rate:  [49-72] 53 (10/11 0309) Cardiac Rhythm:  [-] Atrial fibrillation;Ventricular paced (10/11 0701) Resp:  [15-24] 18 (10/11 0309) BP: (105-159)/(42-80) 152/42 mmHg (10/11 0309) SpO2:  [96 %-100 %] 100 % (10/11 0737)  Hemodynamic parameters for last 24 hours:    Intake/Output from previous day: 10/10 0701 - 10/11 0700 In: 916.3 [P.O.:120; NG/GT:796.3] Out: 475 [Urine:475] Intake/Output this shift:    General appearance: alert, cooperative and no distress Heart: regular rate and rhythm Lungs: clear to auscultation bilaterally  Lab Results:  Recent Labs  10/10/15 1138  WBC 6.3  HGB 15.8  HCT 47.0  PLT 94*   BMET:  Recent Labs  10/10/15 1138  NA 142  K 4.6  CL 99*  CO2 32  GLUCOSE 183*  BUN 30*  CREATININE 0.77  CALCIUM 9.7    PT/INR: No results for input(s): LABPROT, INR in the last 72 hours. ABG    Component Value Date/Time   PHART 7.316* 09/26/2012 0225   HCO3 22.6 09/26/2012 0225   TCO2 24.0 09/26/2012 0225   ACIDBASEDEF 2.6* 09/26/2012 0225   O2SAT 99.5 09/26/2012 0225   CBG (last 3)  No results for input(s): GLUCAP in the last 72 hours.  Meds Scheduled Meds: . aspirin EC  81 mg Oral Daily  . feeding supplement (JEVITY 1.2 CAL)  1,000 mL Per Tube Q24H  . free water  200 mL Per Tube 3 times per day  . levothyroxine  100 mcg Oral QAC breakfast  . mirtazapine  15 mg Per Tube QHS  . mometasone-formoterol  2 puff Inhalation BID  . sodium chloride  3 mL Intravenous Q12H  . tiotropium  18 mcg Inhalation BID   Continuous Infusions:  PRN Meds:.acetaminophen **OR** acetaminophen, alum & mag hydroxide-simeth, bisacodyl, guaiFENesin,  ipratropium-albuterol, ondansetron **OR** ondansetron (ZOFRAN) IV, oxyCODONE, senna-docusate  Xrays X-ray Chest Pa And Lateral  10/11/2015   CLINICAL DATA:  Followup of pneumothorax.  EXAM: CHEST  2 VIEW  COMPARISON:  1 day prior  FINDINGS: Single lead pacer, terminating in right ventricle. Midline trachea. Moderate cardiomegaly. Atherosclerosis in the transverse aorta. Right costophrenic angle minimally excluded. Left-sided pneumothorax is again identified. Slightly decreased, with the visceral pleural line 12 mm from the chest wall at the apex. 15 mm on the prior. No mediastinal shift. No congestive failure. Probable scarring and atelectasis at the left lung base.  IMPRESSION: Decrease in 5-10% left-sided pneumothorax.  Cardiomegaly without congestive failure.   Electronically Signed   By: Abigail Miyamoto M.D.   On: 10/11/2015 08:17   Dg Chest 2 View  10/10/2015   CLINICAL DATA:  One day history of left-sided chest pain  EXAM: CHEST  2 VIEW  COMPARISON:  Chest radiograph September 18, 2014 and chest CT June 20, 2015  FINDINGS: There is a focal pneumothorax at the left base without tension component. There is scarring and fibrosis in the left base. Lungs elsewhere clear. Heart size and pulmonary vascularity are normal. No adenopathy. Pacemaker lead is attached to the middle cardiac vein. No adenopathy. There is degenerative  change in the thoracic spine.  IMPRESSION: Pneumothorax left base without tension component. Scarring/ fibrosis left base. No edema or consolidation. Pacemaker lead attached to middle cardiac vein.  Critical Value/emergent results were called by telephone at the time of interpretation on 10/10/2015 at 12:29 pm to Dr. Sherwood Gambler , who verbally acknowledged these results.   Electronically Signed   By: Lowella Grip III M.D.   On: 10/10/2015 12:30    Assessment/Plan:  1 remains a non surgical intervention issue at this time.     LOS: 1 day    GOLD,WAYNE E 10/11/2015  I  have seen and examined the patient and agree with the assessment and plan as outlined.  Pneumothorax appears smaller.  I remain hopeful that this can be managed without chest tube placement.  I spent in excess of 15 minutes during the conduct of this hospital encounter and >50% of this time involved direct face-to-face encounter with the patient for counseling and/or coordination of their care.   Rexene Alberts, MD 10/11/2015 6:29 PM

## 2015-10-11 NOTE — Evaluation (Addendum)
Physical Therapy Evaluation Patient Details Name: [name] MRN: [id number] DOB: January 31, 1931 Today's Date: 10/11/2015   History of Present Illness  [name] is a 81 y.o. male with a Past Medical History of COPD, dysphagia status post PEG placement, CVA, multiple aspiration pneumonias, hepatocellular carcinoma, Diabetes, dissecting aortic aneurysm. who presents with shortness of breath consistent with left pneumothorax.  Clinical Impression  Pt admitted with/for SOB.  Pt currently limited functionally due to the problems listed below.  (see problems list.)  Pt will benefit from PT to maximize function and safety to be able to get home safely with available assist of family.     Follow Up Recommendations Home health PT;Supervision - Intermittent    Equipment Recommendations  Cane    Recommendations for Other Services       Precautions / Restrictions Precautions Precautions: Fall      Mobility  Bed Mobility Overal bed mobility: Needs Assistance Bed Mobility: Supine to Sit     Supine to sit: Supervision        Transfers Overall transfer level: Needs assistance   Transfers: Sit to/from Stand Sit to Stand: Supervision            Ambulation/Gait Ambulation/Gait assistance: Min guard Ambulation Distance (Feet): 140 Feet Assistive device: None Gait Pattern/deviations: Step-through pattern Gait velocity: slower with little ability to vary speed Gait velocity interpretation: Below normal speed for age/gender General Gait Details: gait characterized as guarded with short low amplitude steps with consistently bent knees and rigid trunk in flexion  Stairs            Wheelchair Mobility    Modified Rankin (Stroke Patients Only)       Balance Overall balance assessment: Needs assistance Sitting-balance support: No upper extremity supported Sitting balance-Leahy Scale: Fair     Standing balance support: No upper extremity supported Standing  balance-Leahy Scale: Fair                               Pertinent Vitals/Pain Pain Assessment: No/denies pain    Home Living Family/patient expects to be discharged to:: Private residence Living Arrangements: Alone Available Help at Discharge: Family;Available PRN/intermittently Type of Home: House Home Access: Stairs to enter;Ramped entrance Entrance Stairs-Rails: Right;Left Entrance Stairs-Number of Steps: 3 Home Layout: One level Home Equipment: Walker - 2 wheels Additional Comments: Does not use assistive device, but "cruises" furniture.    Prior Function Level of Independence: Independent               Hand Dominance        Extremity/Trunk Assessment   Upper Extremity Assessment: Defer to OT evaluation (used UE appropriately to assist getting OOB)           Lower Extremity Assessment: Generalized weakness (proximal weakness and lower truncal weakness)         Communication   Communication: No difficulties  Cognition Arousal/Alertness: Awake/alert Behavior During Therapy: WFL for tasks assessed/performed Overall Cognitive Status: Within Functional Limits for tasks assessed                      General Comments General comments (skin integrity, edema, etc.): SpO2 during gait at 94% and EHR 52 bpm (paced)    Exercises        Assessment/Plan    PT Assessment Patient needs continued PT services  PT Diagnosis Abnormality of gait;Generalized weakness (decr activity tolerance)   PT Problem  List Decreased strength;Decreased activity tolerance;Decreased mobility;Decreased knowledge of use of DME;Decreased knowledge of precautions  PT Treatment Interventions DME instruction;Gait training;Functional mobility training;Therapeutic activities;Stair training;Patient/family education   PT Goals (Current goals can be found in the Care Plan section) Acute Rehab PT Goals Patient Stated Goal: get home PT Goal Formulation: With patient Time  For Goal Achievement: 10/18/15 Potential to Achieve Goals: Fair    Frequency Min 3X/week   Barriers to discharge Decreased caregiver support      Co-evaluation               End of Session   Activity Tolerance: Patient tolerated treatment well;Patient limited by fatigue Patient left: in chair;with call bell/phone within reach;with family/visitor present Nurse Communication: Mobility status    Functional Assessment Tool Used: clinical judgement Functional Limitation: Mobility: Walking and moving around Mobility: Walking and Moving Around Current Status (L8921): At least 1 percent but less than 20 percent impaired, limited or restricted Mobility: Walking and Moving Around Goal Status [phone]): At least 1 percent but less than 20 percent impaired, limited or restricted    Time: 1139-1205 PT Time Calculation (min) (ACUTE ONLY): 26 min   Charges:   PT Evaluation $Initial PT Evaluation Tier I: 1 Procedure PT Treatments $Gait Training: 8-22 mins   PT G Codes:   PT G-Codes **NOT FOR INPATIENT CLASS** Functional Assessment Tool Used: clinical judgement Functional Limitation: Mobility: Walking and moving around Mobility: Walking and Moving Around Current Status (E0814): At least 1 percent but less than 20 percent impaired, limited or restricted Mobility: Walking and Moving Around Goal Status [phone]): At least 1 percent but less than 20 percent impaired, limited or restricted    Carra Brindley, Tessie Fass 10/11/2015, 12:39 PM 10/11/2015  Donnella Sham, PT [phone]  (pager)

## 2015-10-11 NOTE — Progress Notes (Signed)
Initial Nutrition Assessment  DOCUMENTATION CODES:   Severe malnutrition in context of chronic illness  INTERVENTION:    Increase nocturnal TF regimen to Jevity 1.2 formula at 110 ml/hr x 12 hours via PEG tube (7 PM to 7 AM)  TF regimen to provide 1584 kcals, 73 gm protein, 1065 ml of free water  NUTRITION DIAGNOSIS:   Inadequate oral intake related to chronic illness, dysphagia as evidenced by  (supplemental TF via PEG tube)  GOAL:   Patient will meet greater than or equal to 90% of their needs  MONITOR:   TF tolerance, PO intake, Labs, Weight trends, I & O's  REASON FOR ASSESSMENT:   Consult Assessment of nutrition requirement/status  ASSESSMENT:   81 y.o. Male with a PMH of COPD, dysphagiat, CVA, multiple aspiration pneumonias, hepatocellular carcinoma, thrombocytopenia, DM, dissecting aortic aneurysm. who presents with SOB consistent with left pneumothorax.   RD spoke with patient's daughter via telephone.  Reports pt receives nocturnal TF regimen via PEG tube utilizing Jevity 1.2 formula at 110 ml/hr.  Daughter typically starts his feeding pump around 7:00/7:30 PM and stops at 7:30/8:00 AM.    Pt with [facility] for home health needs.  Pt takes very little by mouth.  Daughter reports pt usually "spits out" his food.  S/p bedside swallow evaluation this AM.  SLP recommending to continue with Full Liquids.  Current ordered TF regimen: Jevity 1.2 at 91 ml/hr x 11 hours.  Provides 1201 kcals, 55 gm protein, 808 ml of free water.  Free water flushes at 200 ml every 8 hours.  Nutrition-Focused physical exam completed. Findings are severe fat depletion, severe muscle depletion, and no edema.  Telephone with readback orders received per Dr. Grandville Silos to increase nocturnal TF regimen and add Adult Tube Feeding Protocol.   Diet Order:  Diet full liquid Room service appropriate?: Yes; Fluid consistency:: Thin  Skin:  Reviewed, no issues  Last BM:  N/A  Height:    Ht Readings from Last 1 Encounters:  10/11/15 5\' 9"  (1.753 m)    Weight:   Wt Readings from Last 1 Encounters:  10/11/15 125 lb 10.6 oz (57 kg)    Ideal Body Weight:  72.7 kg  BMI:  Body mass index is 18.55 kg/(m^2).  Estimated Nutritional Needs:   Kcal:  1500-1700  Protein:  70-80 gm  Fluid:  1.5-1.7 L  EDUCATION NEEDS:   No education needs identified at this time  Arthur Holms, RD, LDN Pager #: [phone] After-Hours Pager #: [phone]

## 2015-10-11 NOTE — Progress Notes (Signed)
[facility] PROGRESS NOTE  [name] LKT:[id number] DOB: 1931/01/30 DOA: 10/10/2015 PCP: Conni Slipper, NP  Assessment/Plan: #1 pneumothorax Questionable etiology. May be from patient's underlying COPD. Patient with clinical improvement. Chest x-ray with some improvement. Continue observation. Cardiothoracic surgery following.  #2 severe COPD Stable. Continue home regimen of duo nebs, Spiriva, Dulera.  #3 dysphagia status post PEG Patient noted to be on Jevity tube feeds as well as a full liquid/pured diet orally. Patient has been assessed by speech therapy and patient and family accept the risks of aspiration in terms of oral full liquids. Nursing noted some leaking around G-tube site. Will get a KUB. Will consulted IR to assess G-tube.  #4 atrial fibrillation Currently rate controlled. Due to thrombocytopenia patient not anticoagulation candidate at this time. Aspirin.  #5 hypothyroidism Continue home dose Synthroid.  #6 protein calorie malnutrition Continue current tube feeds. Nutrition following.  #7 thrombocytopenia Chronic in nature. No overt bleeding. Follow.  #8 prophylaxis SCDs for DVT prophylaxis.  Code Status: Full Family Communication: Updated patient and daughter at bedside. Disposition Plan: Home with home health when medically stable hopefully tomorrow.   Consultants:  Cardiothoracic surgery: Dr. Roxy Manns 10/10/2015  Procedures:  Chest x-ray 10/10/2015, 10/11/2015  Antibiotics:  None  HPI/Subjective: Patient states shortness of breath has improved. No chest pain.  Objective: Filed Vitals:   10/11/15 0309  BP: 152/42  Pulse: 53  Temp: 97.7 F (36.5 C)  Resp: 18    Intake/Output Summary (Last 24 hours) at 10/11/15 1215 Last data filed at 10/11/15 0800  Gross per 24 hour  Intake 1016.25 ml  Output    475 ml  Net 541.25 ml   There were no vitals filed for this visit.  Exam:   General:  NAD  Cardiovascular:  RRR  Respiratory: CTAB  Abdomen: Soft, nontender, nondistended, positive bowel sounds.  Musculoskeletal: No clubbing cyanosis or edema.   Data Reviewed: Basic Metabolic Panel:  Recent Labs Lab 10/10/15 1138  NA 142  K 4.6  CL 99*  CO2 32  GLUCOSE 183*  BUN 30*  CREATININE 0.77  CALCIUM 9.7   Liver Function Tests: No results for input(s): AST, ALT, ALKPHOS, BILITOT, PROT, ALBUMIN in the last 168 hours. No results for input(s): LIPASE, AMYLASE in the last 168 hours. No results for input(s): AMMONIA in the last 168 hours. CBC:  Recent Labs Lab 10/10/15 1138 10/11/15 1054  WBC 6.3 6.2  HGB 15.8 13.6  HCT 47.0 40.3  MCV 96.1 95.5  PLT 94* 65*   Cardiac Enzymes: No results for input(s): CKTOTAL, CKMB, CKMBINDEX, TROPONINI in the last 168 hours. BNP (last 3 results) No results for input(s): BNP in the last 8760 hours.  ProBNP (last 3 results) No results for input(s): PROBNP in the last 8760 hours.  CBG: No results for input(s): GLUCAP in the last 168 hours.  No results found for this or any previous visit (from the past 240 hour(s)).   Studies: X-ray Chest Pa And Lateral  10/11/2015   CLINICAL DATA:  Followup of pneumothorax.  EXAM: CHEST  2 VIEW  COMPARISON:  1 day prior  FINDINGS: Single lead pacer, terminating in right ventricle. Midline trachea. Moderate cardiomegaly. Atherosclerosis in the transverse aorta. Right costophrenic angle minimally excluded. Left-sided pneumothorax is again identified. Slightly decreased, with the visceral pleural line 12 mm from the chest wall at the apex. 15 mm on the prior. No mediastinal shift. No congestive failure. Probable scarring and atelectasis at the left lung base.  IMPRESSION: Decrease  in 5-10% left-sided pneumothorax.  Cardiomegaly without congestive failure.   Electronically Signed   By: Abigail Miyamoto M.D.   On: 10/11/2015 08:17   Dg Chest 2 View  10/10/2015   CLINICAL DATA:  One day history of left-sided chest pain   EXAM: CHEST  2 VIEW  COMPARISON:  Chest radiograph September 18, 2014 and chest CT June 20, 2015  FINDINGS: There is a focal pneumothorax at the left base without tension component. There is scarring and fibrosis in the left base. Lungs elsewhere clear. Heart size and pulmonary vascularity are normal. No adenopathy. Pacemaker lead is attached to the middle cardiac vein. No adenopathy. There is degenerative change in the thoracic spine.  IMPRESSION: Pneumothorax left base without tension component. Scarring/ fibrosis left base. No edema or consolidation. Pacemaker lead attached to middle cardiac vein.  Critical Value/emergent results were called by telephone at the time of interpretation on 10/10/2015 at 12:29 pm to Dr. Sherwood Gambler , who verbally acknowledged these results.   Electronically Signed   By: Lowella Grip III M.D.   On: 10/10/2015 12:30    Scheduled Meds: . aspirin EC  81 mg Oral Daily  . feeding supplement (JEVITY 1.2 CAL)  1,000 mL Per Tube Q24H  . free water  200 mL Per Tube 3 times per day  . levothyroxine  100 mcg Oral QAC breakfast  . mirtazapine  15 mg Per Tube QHS  . mometasone-formoterol  2 puff Inhalation BID  . sodium chloride  3 mL Intravenous Q12H  . tiotropium  18 mcg Inhalation BID   Continuous Infusions:   Principal Problem:   Pneumothorax, left Active Problems:   HCC (hepatocellular carcinoma), surgery at [facility] March 2012   Hypertension   Hypothyroid   Alcohol abuse, in remission   Atrial flutter, recurrent, history of RFA in 2006, at times brady.   Protein-calorie malnutrition, severe (HCC)   Tobacco abuse, in remission   Thrombocytopenia (HCC)   Pneumothorax    Time spent: 67 minutes    Felcia Huebert M.D. [facility] Pager [phone]. If 7PM-7AM, please contact night-coverage at [email or web address], password [facility] 10/11/2015, 12:15 PM  LOS: 1 day

## 2015-10-11 NOTE — Evaluation (Signed)
Clinical/Bedside Swallow Evaluation Patient Details  Name: [name] MRN: [id number] Date of Birth: [birth year]  Today's Date: 10/11/2015 Time: SLP Start Time (ACUTE ONLY): 0910 SLP Stop Time (ACUTE ONLY): 0943 SLP Time Calculation (min) (ACUTE ONLY): 33 min  Past Medical History:  Past Medical History  Diagnosis Date  . DM2 (diabetes mellitus, type 2) ([place])   . Hyperlipidemia   . Hypertension   . Cerebral vascular disease   . [place] (hepatocellular carcinoma) ([place])     s/p right lobe resection 2012  . Blood dyscrasia     THROMBCYTOPATHIA  . Peripheral vascular disease ([place])     LICA stent [id number]  . Pneumonia 11/03/2012    aspiration pneumonias  . COPD (chronic obstructive pulmonary disease) (HCC)     Gold C  . Thrombocytopenia ([place])   . Severe malnutrition ([place])   . Dysphagia     s/p PEG placement  . Paroxysmal atrial flutter (HCC)     and fibrillation with known bradycardia, Chads2vasc of 4, not on A/C due to thrombocytopenia and functional status  . Dissecting aortic aneurysm, abdominal ([place]) 11/13/2011    Overview:  Severe aortoiliac stenosis with possible a chronic dissection versus  ulcerated plaque/thrombus extending above and below the aortic  bifurcation; without aneurysmal dilation  as per CT abdomen noted 09/25/2011    Past Surgical History:  Past Surgical History  Procedure Laterality Date  . Carotid stent  2002  . Liver canc    . Posterior cervical fusion/foraminotomy  09/25/2012    Procedure: POSTERIOR CERVICAL FUSION/FORAMINOTOMY LEVEL 2;  Surgeon: Eustace Moore, MD;  Location: [facility];  Service: Neurosurgery;  Laterality: N/A;  Posterior Cervical three-four laminectomy, posterior cervical three-four, four-five fusion  . Esophagogastroduodenoscopy  10/01/2012    Procedure: ESOPHAGOGASTRODUODENOSCOPY (EGD);  Surgeon: Gwenyth Ober, MD;  Location: [facility];  Service: General;  Laterality: N/A;  wyatt/leone  . Peg placement  10/01/2012    Procedure:  PERCUTANEOUS ENDOSCOPIC GASTROSTOMY (PEG) PLACEMENT;  Surgeon: Gwenyth Ober, MD;  Location: [facility];  Service: General;  Laterality: N/A;  . Esophagogastroduodenoscopy  10/31/2012    Procedure: ESOPHAGOGASTRODUODENOSCOPY (EGD);  Surgeon: Beryle Beams, MD;  Location: [facility];  Service: Endoscopy;  Laterality: N/A;  . Carotid angiogram  2012  . Doppler echocardiography  2011   HPI:  [name] is an 81 year old male with a past medical history of COPD, chronic dysphagia with feeding tube in place, CVA, multiple aspiration pneumonias, hepatocellular carcinoma, thrombocytopenia, diabetes, and dissecting aortic aneurysm. Patient admitted 10/10 with shortness of breath and left pneumothorax.  Order received for bedside swallow evaluation.   Assessment / Plan / Recommendation Clinical Impression  Bedside swallow evaluation completed with patient and daughter input.  Chart review revealed a long history of dysphagia with known aspiration and patient choosing to eat and drink despite risk due to his desire for "quality of life."  Patient expressed his desire to eat ice cream and drink what he wants during this admission.  As a result, patient consumed Dys.1 textures and thin liquids with suspected delayed swallow which resulted in immediate cough with thin liquids and multiple swallows across consistencies with wet vocal quality and eventual oral expectoration of a suspected mixture of pharyngeal residue and penetrates/aspirates.  Portion control and pacing decreased severity of s/s of aspiration but did not eliminate them.    Patient continues to demonstrate overt s/s of aspiration with his desired consistencies.  He and his daughter were re-educated on  risks and things such as the [employer] Protocol and diligent oral care with tube feeds as ways to increase quality while reducing aspiration risk; however, they verbalized wanting patient to have ice cream and coke when he wanted.       Daughter asked SLP if her father's COPD or aspiration caused this hospitalization and SLP recommended she discuss this with the MD, but that aspiration was likely a contributing factor.  Daughter asked how much her father was aspirating and SLP explained that this could not be answered at the bedside.  While an objective assessment (MBS) could show this it is unlikely that this information will change the patient's wishes; as a result, question the need for another objective swallow assessment.  MD please order if warranted at this time.     SLP will follow up x1 during this admission to wrap up education and offer advanced texture trials.      Aspiration Risk  Severe    Diet Recommendation Other (Comment) (continue with current orders )   Medication Administration: Via alternative means Compensations: Slow rate;Small sips/bites    Other  Recommendations Oral Care Recommendations: Oral care QID Other Recommendations: Have oral suction available   Follow Up Recommendations  TBD   Frequency and Duration min 2x/week  1 week   Pertinent Vitals/Pain None    SLP Swallow Goals See care plan    Swallow Study Prior Functional Status  Chronic aspiration with feeding tube; patient and daughter report that prior to admission patient was eating and drinking what he wanted for "quality of life" with known risk of aspiration.      General Other Pertinent Information: [name] is an 81 year old male with a past medical history of COPD, chronic dysphagia with feeding tube in place, CVA, multiple aspiration pneumonias, hepatocellular carcinoma, thrombocytopenia, diabetes, and dissecting aortic aneurysm. Patient admitted 10/10 with shortness of breath and left pneumothorax.  Order received for bedside swallow evaluation. Type of Study: Bedside swallow evaluation Previous Swallow Assessment: MBS 10/14, BSE 9/15 others studies at [facility] per daughter report Diet Prior to this Study: Other  (Comment) (full liquid diet ) Temperature Spikes Noted: No Respiratory Status: Supplemental O2 delivered via (comment) ([place]) History of Recent Intubation: No Behavior/Cognition: Alert;Cooperative;Pleasant mood Oral Cavity - Dentition: Edentulous Self-Feeding Abilities: Able to feed self;Needs set up Patient Positioning: Upright in bed Baseline Vocal Quality: Low vocal intensity Volitional Cough: Other (Comment) (strong hock ) Volitional Swallow: Able to elicit    Oral/Motor/Sensory Function Overall Oral Motor/Sensory Function: Appears within functional limits for tasks assessed (some generalized weakness)   Ice Chips Ice chips: Impaired Presentation: Spoon;Self Fed Pharyngeal Phase Impairments: Throat Clearing - Delayed;Other (comments);Suspected delayed Swallow;Decreased hyoid-laryngeal movement;Wet Vocal Quality (orally expectorated suspected residuals and penetrates/aspirates )   Thin Liquid Thin Liquid: Impaired Presentation: Self Fed;Straw Pharyngeal  Phase Impairments: Suspected delayed Swallow;Multiple swallows;Decreased hyoid-laryngeal movement;Wet Vocal Quality;Cough - Immediate;Cough - Delayed;Other (comments) (oral expectoration of suspected residue and penetrates/aspirates)    Nectar Thick Nectar Thick Liquid: Not tested   Honey Thick Honey Thick Liquid: Not tested   Puree Puree: Impaired Presentation: Spoon Pharyngeal Phase Impairments: Suspected delayed Swallow;Decreased hyoid-laryngeal movement;Multiple swallows;Wet Vocal Quality;Throat Clearing - Delayed   Solid   GO Functional Assessment Tool Used: skilled clinical judgement Functional Limitations: Swallowing Swallow Current Status (Q0086): At least 80 percent but less than 100 percent impaired, limited or restricted Swallow Goal Status [phone]): At least 80 percent but less than 100 percent impaired, limited or restricted  Solid: Not tested       Gunnar Fusi, M.A.,  CCC-SLP [phone]  Franz Svec 10/11/2015,10:33 AM
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
## 2015-10-11 NOTE — Progress Notes (Signed)
Results from dx ab came back and jpeg tube is coiled in stomach. Notified K.Schorr. Who stated that it's ok to give feeding tonight.

## 2015-10-12 ENCOUNTER — Observation Stay (HOSPITAL_COMMUNITY): Payer: Medicare Other

## 2015-10-12 DIAGNOSIS — J939 Pneumothorax, unspecified: Principal | ICD-10-CM

## 2015-10-12 DIAGNOSIS — J9383 Other pneumothorax: Secondary | ICD-10-CM

## 2015-10-12 HISTORY — PX: PEG TUBE PLACEMENT: SUR1034

## 2015-10-12 LAB — GLUCOSE, CAPILLARY
GLUCOSE-CAPILLARY: 188 mg/dL — AB (ref 65–99)
Glucose-Capillary: 128 mg/dL — ABNORMAL HIGH (ref 65–99)

## 2015-10-12 MED ORDER — BACITRACIN ZINC 500 UNIT/GM EX OINT
TOPICAL_OINTMENT | Freq: Two times a day (BID) | CUTANEOUS | Status: DC
  Administered 2015-10-12 – 2015-10-13 (×3): via TOPICAL
  Filled 2015-10-12: qty 28.35

## 2015-10-12 MED ORDER — LIDOCAINE VISCOUS 2 % MT SOLN
OROMUCOSAL | Status: AC
  Filled 2015-10-12: qty 15

## 2015-10-12 MED ORDER — IOHEXOL 300 MG/ML  SOLN
50.0000 mL | Freq: Once | INTRAMUSCULAR | Status: DC | PRN
  Administered 2015-10-12: 15 mL
  Filled 2015-10-12: qty 50

## 2015-10-12 MED ORDER — SODIUM CHLORIDE 0.9 % IV SOLN
3.0000 g | Freq: Four times a day (QID) | INTRAVENOUS | Status: DC
  Administered 2015-10-12 – 2015-10-13 (×5): 3 g via INTRAVENOUS
  Filled 2015-10-12 (×8): qty 3

## 2015-10-12 MED ORDER — BACITRACIN ZINC 500 UNIT/GM EX OINT
TOPICAL_OINTMENT | CUTANEOUS | Status: DC | PRN
  Filled 2015-10-12: qty 28.35

## 2015-10-12 NOTE — Progress Notes (Signed)
[facility] PROGRESS NOTE  ZAC TORTI WCB:[id number] DOB: [birth year] DOA: 10/10/2015 PCP: Conni Slipper, NP  Assessment/Plan:  pneumothorax - Most likely related to patient's underlying COPD.  - Managed by CT surgery, pneumothorax continues to improve, will need outpatient follow-up with CT surgery on discharge.  severe COPD - Stable. Continue home regimen of duo nebs, Spiriva, Dulera.   dysphagia status post PEG - Patient noted to be on Jevity tube feeds as well as a full liquid/pured diet orally. Patient has been assessed by speech therapy and patient and family accept the risks of aspiration in terms of oral full liquids.  - Patient was noticed with erythema at PEG site insertion, with foul-smelling odor, started on IV Unasyn for treatment of peg insertion site infection/cellulitis, will need IV antibiotics for 24-48 hours, then hopefully be able to transition to oral.  atrial fibrillation Currently rate controlled. Due to thrombocytopenia patient not anticoagulation candidate at this time. Aspirin.  hypothyroidism Continue home dose Synthroid.  protein calorie malnutrition Continue current tube feeds. Nutrition following.   thrombocytopenia Chronic in nature. No overt bleeding. Follow.  prophylaxis SCDs for DVT prophylaxis.  Code Status: Full Family Communication: Updated patient and daughter at bedside. Disposition Plan: In 24-48 hours, awaiting PT evaluation.   Consultants:  Cardiothoracic surgery: Dr. Roxy Manns 10/10/2015  Procedures:  Chest x-ray 10/10/2015, 10/11/2015  Antibiotics:  None  HPI/Subjective: Patient states shortness of breath has improved. No chest pain.  Objective: Filed Vitals:   10/12/15 0416  BP: 140/67  Pulse: 57  Temp: 98.6 F (37 C)  Resp:     Intake/Output Summary (Last 24 hours) at 10/12/15 1107 Last data filed at 10/12/15 0800  Gross per 24 hour  Intake 3115.98 ml  Output   1150 ml  Net 1965.98 ml   Filed  Weights   10/11/15 1403 10/12/15 0416  Weight: 57 kg (125 lb 10.6 oz) 58.2 kg (128 lb 4.9 oz)    Exam:   General:  NAD  Cardiovascular: RRR  Respiratory: CTAB  Abdomen: Soft, nontender, nondistended, positive bowel sounds. Erythema with foul-smelling odor surrounding PEG tube insertion site  Musculoskeletal: No clubbing cyanosis or edema.   Data Reviewed: Basic Metabolic Panel:  Recent Labs Lab 10/10/15 1138 10/11/15 1054  NA 142 134*  K 4.6 4.0  CL 99* 100*  CO2 32 28  GLUCOSE 183* 219*  BUN 30* 23*  CREATININE 0.77 0.71  CALCIUM 9.7 8.4*   Liver Function Tests: No results for input(s): AST, ALT, ALKPHOS, BILITOT, PROT, ALBUMIN in the last 168 hours. No results for input(s): LIPASE, AMYLASE in the last 168 hours. No results for input(s): AMMONIA in the last 168 hours. CBC:  Recent Labs Lab 10/10/15 1138 10/11/15 1054  WBC 6.3 6.2  HGB 15.8 13.6  HCT 47.0 40.3  MCV 96.1 95.5  PLT 94* 65*   Cardiac Enzymes: No results for input(s): CKTOTAL, CKMB, CKMBINDEX, TROPONINI in the last 168 hours. BNP (last 3 results) No results for input(s): BNP in the last 8760 hours.  ProBNP (last 3 results) No results for input(s): PROBNP in the last 8760 hours.  CBG:  Recent Labs Lab 10/11/15 1700 10/11/15 2027 10/11/15 2346 10/12/15 0412  GLUCAP 132* 105* 158* 188*    No results found for this or any previous visit (from the past 240 hour(s)).   Studies: Dg Chest 2 View  10/12/2015  CLINICAL DATA:  Followup pneumothorax EXAM: CHEST - 2 VIEW COMPARISON:  10/11/2015 FINDINGS: Cardiac shadow is stable. A pacing device  is again seen and stable. The known left pneumothorax is again identified and stable with the predominately apical component. A small amount of pleural fluid and filled the space inferiorly. Mild basilar atelectasis is noted on the left as well. IMPRESSION: New left pleural effusion and left basilar atelectasis. The patient's known left pneumothorax is  stable in appearance. Electronically Signed   By: Inez Catalina M.D.   On: 10/12/2015 08:54   X-ray Chest Pa And Lateral  10/11/2015  CLINICAL DATA:  Followup of pneumothorax. EXAM: CHEST  2 VIEW COMPARISON:  1 day prior FINDINGS: Single lead pacer, terminating in right ventricle. Midline trachea. Moderate cardiomegaly. Atherosclerosis in the transverse aorta. Right costophrenic angle minimally excluded. Left-sided pneumothorax is again identified. Slightly decreased, with the visceral pleural line 12 mm from the chest wall at the apex. 15 mm on the prior. No mediastinal shift. No congestive failure. Probable scarring and atelectasis at the left lung base. IMPRESSION: Decrease in 5-10% left-sided pneumothorax. Cardiomegaly without congestive failure. Electronically Signed   By: Abigail Miyamoto M.D.   On: 10/11/2015 08:17   Dg Chest 2 View  10/10/2015  CLINICAL DATA:  One day history of left-sided chest pain EXAM: CHEST  2 VIEW COMPARISON:  Chest radiograph September 18, 2014 and chest CT June 20, 2015 FINDINGS: There is a focal pneumothorax at the left base without tension component. There is scarring and fibrosis in the left base. Lungs elsewhere clear. Heart size and pulmonary vascularity are normal. No adenopathy. Pacemaker lead is attached to the middle cardiac vein. No adenopathy. There is degenerative change in the thoracic spine. IMPRESSION: Pneumothorax left base without tension component. Scarring/ fibrosis left base. No edema or consolidation. Pacemaker lead attached to middle cardiac vein. Critical Value/emergent results were called by telephone at the time of interpretation on 10/10/2015 at 12:29 pm to Dr. Sherwood Gambler , who verbally acknowledged these results. Electronically Signed   By: Lowella Grip III M.D.   On: 10/10/2015 12:30   Dg Abd 1 View  10/11/2015  CLINICAL DATA:  Leaking gastrostomy catheter, check positioning. EXAM: ABDOMEN - 1 VIEW COMPARISON:  None. FINDINGS: Scattered  large and small bowel gas is noted. Contrast material was then injected through the patient's indwelling gastrostomy catheter which lies in the proximal duodenum just beyond the pyloric channel. The remainder of the catheter is looped within the stomach consistent with retrograde migration. IMPRESSION: Gastrostomy catheter within the stomach although the distal ileum which previously lied within the proximal jejunum is now coiled within the stomach. Electronically Signed   By: Inez Catalina M.D.   On: 10/11/2015 19:19    Scheduled Meds: . ampicillin-sulbactam (UNASYN) IV  3 g Intravenous Q6H  . aspirin EC  81 mg Oral Daily  . bacitracin   Topical BID  . feeding supplement (JEVITY 1.2 CAL)  1,000 mL Per Tube Q24H  . free water  200 mL Per Tube 3 times per day  . Influenza vac split quadrivalent PF  0.5 mL Intramuscular Tomorrow-1000  . levothyroxine  100 mcg Oral QAC breakfast  . mirtazapine  15 mg Per Tube QHS  . mometasone-formoterol  2 puff Inhalation BID  . pneumococcal 23 valent vaccine  0.5 mL Intramuscular Tomorrow-1000  . sodium chloride  3 mL Intravenous Q12H  . tiotropium  18 mcg Inhalation BID   Continuous Infusions:   Principal Problem:   Pneumothorax, left Active Problems:   HCC (hepatocellular carcinoma), [facility] March 2012   Hypertension   Hypothyroid  Alcohol abuse, in remission   Atrial flutter, recurrent, history of RFA in 2006, at times brady.   Protein-calorie malnutrition, severe (HCC)   Tobacco abuse, in remission   Thrombocytopenia (HCC)   Pneumothorax   Leaking PEG tube (HCC)   Chronic atrial fibrillation ([place])    Time spent: 35 minutes    Jarman Litton M.D. [facility] Pager [phone]. If 7PM-7AM, please contact night-coverage at [email or web address], password [facility] 10/12/2015, 11:07 AM  LOS: 2 days

## 2015-10-12 NOTE — Progress Notes (Addendum)
       [address]       [place] [address]             [phone]               Subjective: Comfortable, breathing stable, slight dry cough.   Objective: Vital signs in last 24 hours: Patient Vitals for the past 24 hrs:  BP Temp Temp src Pulse Resp SpO2 Height Weight  10/12/15 0416 140/67 mmHg 98.6 F (37 C) Oral (!) 57 - 99 % - 128 lb 4.9 oz (58.2 kg)  10/12/15 0256 - - - - - 98 % - -  10/11/15 2033 (!) 150/62 mmHg 98.6 F (37 C) Oral (!) 55 18 99 % - -  10/11/15 1959 - - - - - 97 % - -  10/11/15 1403 - - - - - - 5\' 9"  (1.753 m) 125 lb 10.6 oz (57 kg)  10/11/15 1349 (!) 131/48 mmHg 98.4 F (36.9 C) Oral (!) 53 17 97 % - -   Current Weight  10/12/15 128 lb 4.9 oz (58.2 kg)     Intake/Output from previous day: 10/11 0701 - 10/12 0700 In: 720 [P.O.:320; NG/GT:400] Out: 1000 [Urine:1000]    PHYSICAL EXAM:  General: No distress, sats stable on 2L Heart: Irr irr Lungs: Clear     Lab Results: CBC: Recent Labs  10/10/15 1138 10/11/15 1054  WBC 6.3 6.2  HGB 15.8 13.6  HCT 47.0 40.3  PLT 94* 65*   BMET:  Recent Labs  10/10/15 1138 10/11/15 1054  NA 142 134*  K 4.6 4.0  CL 99* 100*  CO2 32 28  GLUCOSE 183* 219*  BUN 30* 23*  CREATININE 0.77 0.71  CALCIUM 9.7 8.4*    PT/INR: No results for input(s): LABPROT, INR in the last 72 hours.    Assessment/Plan: Left basilar pneumothorax- Awaiting CXR this morning.  Will follow up CXR, continue conservative management for now. Other issues per primary service.   LOS: 2 days    COLLINS,GINA H 10/12/2015  I have seen and examined the patient and agree with the assessment and plan as outlined.  Pneumothorax has decreased further in size.  Okay for d/c home.  Will arrange for office appointment and f/u CXR in 2 weeks.  I spent in excess of 15 minutes during the conduct of this hospital encounter and >50% of this time involved direct face-to-face encounter with the patient for  counseling and/or coordination of their care.   Rexene Alberts, MD 10/12/2015 8:55 AM

## 2015-10-12 NOTE — Progress Notes (Signed)
Physical Therapy Treatment Patient Details Name: [name] MRN: [id number] DOB: 1931/04/13 Today's Date: 10/12/2015    History of Present Illness [name] is a 81 y.o. male with a Past Medical History of COPD, dysphagia status post PEG placement, CVA, multiple aspiration pneumonias, hepatocellular carcinoma, Diabetes, dissecting aortic aneurysm. who presents with shortness of breath consistent with left pneumothorax.    PT Comments    Progressing steadily.  Gait still unsteady and stiff.  Will try cane or get HHPT to try gait training with the cane.  Follow Up Recommendations  Home health PT;Supervision - Intermittent     Equipment Recommendations  Cane    Recommendations for Other Services       Precautions / Restrictions Precautions Precautions: Fall Restrictions Weight Bearing Restrictions: No    Mobility  Bed Mobility Overal bed mobility: Needs Assistance Bed Mobility: Supine to Sit;Sit to Supine     Supine to sit: Min guard Sit to supine: Min guard   General bed mobility comments: used momentum and some struggle to get to EOB  Transfers Overall transfer level: Needs assistance Equipment used: 1 person hand held assist Transfers: Sit to/from Stand Sit to Stand: Min assist         General transfer comment: cues to push from bed  Ambulation/Gait Ambulation/Gait assistance: Min guard Ambulation Distance (Feet): 200 Feet Assistive device: None (IV pole) Gait Pattern/deviations: Step-through pattern Gait velocity: slower with little ability to vary speed   General Gait Details: gait continues to guarded with short low amplitude steps with consistently bent knees.  Pt more steady, less guarded pushing the iv pole.  Will try cane   Stairs            Wheelchair Mobility    Modified Rankin (Stroke Patients Only)       Balance     Sitting balance-Leahy Scale: Fair       Standing balance-Leahy Scale: Fair                      Cognition Arousal/Alertness: Awake/alert Behavior During Therapy: WFL for tasks assessed/performed Overall Cognitive Status: Within Functional Limits for tasks assessed                      Exercises      General Comments General comments (skin integrity, edema, etc.): SpO2 b/w 95-99% on RA  HR low in the 50's  (paced)      Pertinent Vitals/Pain Pain Assessment: No/denies pain    Home Living Family/patient expects to be discharged to:: Private residence Living Arrangements: Alone Available Help at Discharge: Family;Available PRN/intermittently Type of Home: House Home Access: Stairs to enter;Ramped entrance Entrance Stairs-Rails: Right;Left Home Layout: One level Home Equipment: Walker - 2 wheels Additional Comments: Does not use assistive device, but "cruises" furniture.    Prior Function Level of Independence: Independent          PT Goals (current goals can now be found in the care plan section) Acute Rehab PT Goals Patient Stated Goal: get home PT Goal Formulation: With patient Time For Goal Achievement: 10/18/15 Potential to Achieve Goals: Fair Progress towards PT goals: Progressing toward goals    Frequency  Min 3X/week    PT Plan Current plan remains appropriate    Co-evaluation             End of Session   Activity Tolerance: Patient tolerated treatment well;Patient limited by fatigue Patient left: in bed;with call bell/phone  within reach;with family/visitor present     Time: 9977-4142 PT Time Calculation (min) (ACUTE ONLY): 15 min  Charges:  $Gait Training: 8-22 mins                    G Codes:      Aedon Deason, Tessie Fass 10/12/2015, 11:50 AM  10/12/2015  Donnella Sham, [place] [phone]  (pager)

## 2015-10-12 NOTE — Evaluation (Signed)
Occupational Therapy Evaluation Patient Details Name: [name] MRN: [id number] DOB: 1931-02-04 Today's Date: 10/12/2015    History of Present Illness [name] is a 81 y.o. male with a Past Medical History of COPD, dysphagia status post PEG placement, CVA, multiple aspiration pneumonias, hepatocellular carcinoma, Diabetes, dissecting aortic aneurysm. who presents with shortness of breath consistent with left pneumothorax.   Clinical Impression   Pt admitted with SOB. Pt currently with functional limitations due to the deficits listed below (see OT Problem List).  Pt will benefit from skilled OT to increase their safety and independence with ADL and functional mobility for ADL to facilitate discharge to venue listed below.      Follow Up Recommendations  Home health OT    Equipment Recommendations  None recommended by OT       Precautions / Restrictions Precautions Precautions: Fall Restrictions Weight Bearing Restrictions: No      Mobility Bed Mobility Overal bed mobility: Needs Assistance Bed Mobility: Supine to Sit;Sit to Supine     Supine to sit: Min assist Sit to supine: Min assist      Transfers Overall transfer level: Needs assistance Equipment used: 1 person hand held assist Transfers: Sit to/from [facility] Sit to Stand: Min assist              Balance                                            ADL Overall ADL's : Needs assistance/impaired     Grooming: Min guard;Standing   Upper Body Bathing: Set up;Sitting   Lower Body Bathing: Minimal assistance;Sit to/from stand   Upper Body Dressing : Set up;Sitting   Lower Body Dressing: Minimal assistance;Sit to/from stand   Toilet Transfer: Min guard;Ambulation;Comfort height toilet   Toileting- Clothing Manipulation and Hygiene: Min guard;Sit to/from stand       Functional mobility during ADLs: Min guard                 Pertinent  Vitals/Pain Pain Assessment: No/denies pain     Hand Dominance     Extremity/Trunk Assessment Upper Extremity Assessment Upper Extremity Assessment: Generalized weakness           Communication Communication Communication: No difficulties   Cognition Arousal/Alertness: Awake/alert Behavior During Therapy: WFL for tasks assessed/performed Overall Cognitive Status: Within Functional Limits for tasks assessed                                Home Living Family/patient expects to be discharged to:: Private residence Living Arrangements: Alone Available Help at Discharge: Family;Available PRN/intermittently Type of Home: House Home Access: Stairs to enter;Ramped entrance Entrance Stairs-Number of Steps: 3 Entrance Stairs-Rails: Right;Left Home Layout: One level     Bathroom Shower/Tub: Teacher, early years/pre: Standard     Home Equipment: Environmental consultant - 2 wheels   Additional Comments: Does not use assistive device, but "cruises" furniture.      Prior Functioning/Environment Level of Independence: Independent             OT Diagnosis: Generalized weakness   OT Problem List: Decreased strength;Decreased activity tolerance   OT Treatment/Interventions: Self-care/ADL training;DME and/or AE instruction;Patient/family education    OT Goals(Current goals can be found in the care plan section) Acute Rehab OT  Goals Patient Stated Goal: get home OT Goal Formulation: With patient Time For Goal Achievement: 10/26/15 Potential to Achieve Goals: Good ADL Goals Pt Will Perform Grooming: with modified independence;standing Pt Will Perform Lower Body Dressing: with modified independence;sit to/from stand Pt Will Transfer to Toilet: with modified independence;regular height toilet Pt Will Perform Toileting - Clothing Manipulation and hygiene: with modified independence;sit to/from stand  OT Frequency: Min 2X/week   Barriers to D/C:             Co-evaluation              End of Session    Activity Tolerance: Patient tolerated treatment well Patient left: in bed;with call bell/phone within reach;with family/visitor present   Time: 1045-1105 OT Time Calculation (min): 20 min Charges:  OT General Charges $OT Visit: 1 Procedure OT Evaluation $Initial OT Evaluation Tier I: 1 Procedure G-Codes:    [name] 23-Oct-2015, 11:07 AM

## 2015-10-12 NOTE — Progress Notes (Signed)
Speech Language Pathology Treatment: Dysphagia  Patient Details Name: [name] MRN: [id number] DOB: 07-Feb-1931 Today's Date: 10/12/2015 Time: 9102-8902 SLP Time Calculation (min) (ACUTE ONLY): 8 min  Assessment / Plan / Recommendation Clinical Impression  Skilled treatment session focused on addressing dysphagia education due to patient having just returned from PEG replacement procedure.  Patient in bed with daughter at bedside with no further questions regarding swallow function and feel comfortable with current plan. SLP reinforced education on how to minimize aspiration with pacing and portion control as well as the importance of diligent oral care.  Patient and daughter verbalized understanding of information.  No further skilled SLP services warranted at this time, SLP signing off.   HPI Other Pertinent Information: [name] is an 81 year old male with a past medical history of COPD, chronic dysphagia with feeding tube in place, CVA, multiple aspiration pneumonias, hepatocellular carcinoma, thrombocytopenia, diabetes, and dissecting aortic aneurysm. Patient admitted 10/10 with shortness of breath and left pneumothorax.  Order received for bedside swallow evaluation.   Pertinent Vitals Pain Assessment: No/denies pain  SLP Plan  All goals met    Recommendations Diet recommendations: Dysphagia 1 (puree);Thin liquid Medication Administration: Via alternative means Supervision: Patient able to self feed;Full supervision/cueing for compensatory strategies Compensations: Slow rate;Small sips/bites Postural Changes and/or Swallow Maneuvers: Seated upright 90 degrees              Oral Care Recommendations: Oral care QID Follow up Recommendations: None Plan: All goals met    GO Functional Assessment Tool Used: skilled clinical judgement Functional Limitations: Swallowing Swallow Current Status ([id number]): At least 80 percent but less than 100 percent impaired, limited or  restricted Swallow Goal Status [id number]): At least 80 percent but less than 100 percent impaired, limited or restricted Swallow Discharge Status [id number]): At least 80 percent but less than 100 percent impaired, limited or restricted   Carmelia Roller., CCC-SLP [phone]  [place] 10/12/2015, 4:24 PM

## 2015-10-13 ENCOUNTER — Encounter (HOSPITAL_COMMUNITY): Payer: Self-pay | Admitting: General Practice

## 2015-10-13 DIAGNOSIS — I10 Essential (primary) hypertension: Secondary | ICD-10-CM

## 2015-10-13 DIAGNOSIS — E039 Hypothyroidism, unspecified: Secondary | ICD-10-CM | POA: Diagnosis not present

## 2015-10-13 DIAGNOSIS — D696 Thrombocytopenia, unspecified: Secondary | ICD-10-CM

## 2015-10-13 DIAGNOSIS — I482 Chronic atrial fibrillation: Secondary | ICD-10-CM | POA: Diagnosis not present

## 2015-10-13 DIAGNOSIS — K9423 Gastrostomy malfunction: Secondary | ICD-10-CM

## 2015-10-13 DIAGNOSIS — E43 Unspecified severe protein-calorie malnutrition: Secondary | ICD-10-CM

## 2015-10-13 DIAGNOSIS — J939 Pneumothorax, unspecified: Secondary | ICD-10-CM | POA: Diagnosis not present

## 2015-10-13 DIAGNOSIS — K9422 Gastrostomy infection: Secondary | ICD-10-CM

## 2015-10-13 MED ORDER — AMOXICILLIN-POT CLAVULANATE 875-125 MG PO TABS: 1.0000 | ORAL_TABLET | Freq: Two times a day (BID) | ORAL | Status: DC

## 2015-10-13 MED ORDER — BACITRACIN ZINC 500 UNIT/GM EX OINT: TOPICAL_OINTMENT | Freq: Two times a day (BID) | CUTANEOUS | Status: DC
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
## 2015-10-13 NOTE — Discharge Summary (Signed)
[name], is a 81 y.o. male  DOB Aug 14, 1931  MRN [id number].  Admission date:  10/10/2015  Admitting Physician  Waldemar Dickens, MD  Discharge Date:  10/13/2015   Primary MD  Conni Slipper, NP  Recommendations for primary care physician for things to follow:  - Check labs including CBC, BMP during next visit   Admission Diagnosis  Pneumothorax, left [J93.9]   Discharge Diagnosis  Pneumothorax, left [J93.9]    Principal Problem:   Pneumothorax, left Active Problems:   [place] (hepatocellular carcinoma), surgery at [facility] March 2012   Hypertension   Hypothyroid   Alcohol abuse, in remission   Atrial flutter, recurrent, history of RFA in 2006, at times brady.   Protein-calorie malnutrition, severe (HCC)   Tobacco abuse, in remission   Thrombocytopenia ([place])   Pneumothorax   Leaking PEG tube ([place])   Chronic atrial fibrillation (HCC)   PEG tube malfunction ([place])   Infection of PEG site [facility])      Past Medical History  Diagnosis Date  . DM2 (diabetes mellitus, type 2) ([place])   . Hyperlipidemia   . Hypertension   . Cerebral vascular disease   . [place] (hepatocellular carcinoma) ([place])     s/p right lobe resection 2012  . Blood dyscrasia     THROMBCYTOPATHIA  . Peripheral vascular disease ([place])     LICA stent [id number]  . Pneumonia 11/03/2012    aspiration pneumonias  . COPD (chronic obstructive pulmonary disease) (HCC)     Gold C  . Thrombocytopenia ([place])   . Severe malnutrition ([place])   . Dysphagia     s/p PEG placement  . Paroxysmal atrial flutter (HCC)     and fibrillation with known bradycardia, Chads2vasc of 4, not on A/C due to thrombocytopenia and functional status  . Dissecting aortic aneurysm, abdominal ([place]) 11/13/2011    Overview:  Severe aortoiliac stenosis with possible a chronic dissection versus  ulcerated plaque/thrombus extending above and below the aortic  bifurcation;  without aneurysmal dilation  as per CT abdomen noted 09/25/2011   . Pneumothorax 10/2015    Past Surgical History  Procedure Laterality Date  . Carotid stent  2002  . Liver canc    . Posterior cervical fusion/foraminotomy  09/25/2012    Procedure: POSTERIOR CERVICAL FUSION/FORAMINOTOMY LEVEL 2;  Surgeon: Eustace Moore, MD;  Location: [facility];  Service: Neurosurgery;  Laterality: N/A;  Posterior Cervical three-four laminectomy, posterior cervical three-four, four-five fusion  . Esophagogastroduodenoscopy  10/01/2012    Procedure: ESOPHAGOGASTRODUODENOSCOPY (EGD);  Surgeon: Gwenyth Ober, MD;  Location: [facility];  Service: General;  Laterality: N/A;  wyatt/leone  . Peg placement  10/01/2012    Procedure: PERCUTANEOUS ENDOSCOPIC GASTROSTOMY (PEG) PLACEMENT;  Surgeon: Gwenyth Ober, MD;  Location: [facility];  Service: General;  Laterality: N/A;  . Esophagogastroduodenoscopy  10/31/2012    Procedure: ESOPHAGOGASTRODUODENOSCOPY (EGD);  Surgeon: Beryle Beams, MD;  Location: [facility] ENDOSCOPY;  Service: Endoscopy;  Laterality: N/A;  . Carotid angiogram  2012  . Doppler echocardiography  2011  . Peg tube placement  10/12/2015       replacment tube       History of present illness and  Hospital Course:     Kindly see H&P for history of present illness and admission details, please review complete Labs, Consult reports and Test reports for all details in brief  HPI  from the history and physical done on the day of admission on 10/09/2012  [name] is a 81 y.o. male, with severe COPD, dysphagia s/p PEG, CVA, recurrent aspiration pneumonia, hepatocellular carcinoma, chronic pancytopenia, type 2 diabetes, and history of dissecting aortic aneurysm. He reports that he awoke at 5:30 this morning with left-sided chest pain. It did not radiate. It felt like a dull ache. It was worse with deep breathing. It did not improve until he received pain medication in the emergency department. He has  never felt pain like this before. He denies any palpitations. He has a cough but is bringing up clear sputum-he reports this is normal for him. He has not seen any hemoptysis.  Chest x-ray the emergency department reveals a small left pneumothorax. Point-of-care troponin is 0.03, EKG shows A. fib without acute ST changes. Platelets are 94, chloride 99, BUN 30. Pulse rate is in the 40s to 60s. Vital signs are otherwise stable.  Hospital Course   pneumothorax - Most likely related to patient's underlying COPD.  - Managed by CT surgery, pneumothorax continues to improve, no intervention required during hospital stay, will need  to follow-up with CT surgery on discharge.  severe COPD - Stable. Continue home regimen of duo nebs, Spiriva, Dulera.  dysphagia status post PEG -  was seen by SLP , on dysphagia 1 with thin liquids  - continue with tube feeds as prior to admission  PEG site malfunction/PEG site infection - IR consult appreciated, patient had gastrojejunostomy tube exchange on 10/12 - Patient was noticed with erythema at PEG site insertion, with foul-smelling odtreated with IV will discharged on oral Augmentin, and topical bacitracin   atrial fibrillation Currently rate controlled. Due to thrombocytopenia patient not anticoagulation candidate at this time.on  Aspirin.  hypothyroidism Continue home dose Synthroid.  protein calorie malnutrition Continue current tube feeds. Nutrition following.  thrombocytopenia Chronic in nature. No overt bleeding.     Discharge Condition:  stable   Follow UP  Follow-up Information    Follow up with Rexene Alberts, MD On 10/31/2015.   Specialty:  Cardiothoracic Surgery   Why:  Have a chest x-ray at [facility] at 1:00, then see MD at 2:00   Contact information:   [place] [address] [phone]       Follow up with ANTHONY,ARENNETTE, NP. Call in 1 week.   Specialty:  Nurse Practitioner     Why:  post hospitalization follow-up   Contact information:   [place]  [place] [address] [phone]       Follow up with [facility].   Why:  phyiscal therapy,occupational Counsellor, registered nurse   Contact information:   [address] [place] [address] [phone]         Discharge Instructions  and  Discharge Medications         Discharge Instructions    Discharge instructions    Complete by:  As directed   Follow with Primary MD ANTHONY,ARENNETTE, NP in 7 days   Get CBC, CMP, 2 view Chest X ray checked  by Primary MD next visit.    Activity: As tolerated with Full fall precautions use walker/cane & assistance as needed   Disposition Home    Diet: Dysphagia 1 with thin liquids , with feeding assistance and aspiration precautions. Continue with tube feeding via PEG as previously done prior to admission. For Heart failure patients - Check your Weight same time everyday, if you gain over 2 pounds, or you develop in leg swelling, experience more shortness of breath or chest pain, call your Primary MD immediately. Follow Cardiac Low Salt Diet and 1.5 lit/day fluid restriction.   On your next visit with your primary care physician please Get Medicines reviewed and adjusted.   Please request your Prim.MD to go over all Hospital Tests and Procedure/Radiological results at the follow up, please get all Hospital records sent to your Prim MD by signing hospital release before you go home.   If you experience worsening of your admission symptoms, develop shortness of breath, life threatening emergency, suicidal or homicidal thoughts you must seek medical attention immediately by calling 911 or calling your MD immediately  if symptoms less severe.  You Must read complete instructions/literature along with all the possible adverse reactions/side effects for all the Medicines you take and that have been  prescribed to you. Take any new Medicines after you have completely understood and accpet all the possible adverse reactions/side effects.   Do not drive, operating heavy machinery, perform activities at heights, swimming or participation in water activities or provide baby sitting services if your were admitted for syncope or siezures until you have seen by Primary MD or a Neurologist and advised to do so again.  Do not drive when taking Pain medications.    Do not take more than prescribed Pain, Sleep and Anxiety Medications  Special Instructions: If you have smoked or chewed Tobacco  in the last 2 yrs please stop smoking, stop any regular Alcohol  and or any Recreational drug use.  Wear Seat belts while driving.   Please note  You were cared for by a hospitalist during your hospital stay. If you have any questions about your discharge medications or the care you received while you were in the hospital after you are discharged, you can call the unit and asked to speak with the hospitalist on call if the hospitalist that took care of you is not available. Once you are discharged, your primary care physician will handle any further medical issues. Please note that NO REFILLS for any discharge medications will be authorized once you are discharged, as it is imperative that you return to your primary care physician (or establish a relationship with a primary care physician if you do not have one) for your aftercare needs so that they can reassess your need for medications and monitor your lab values.     Increase activity slowly    Complete by:  As directed             Medication List    TAKE these medications        albuterol (2.5 MG/3ML) 0.083% nebulizer solution  Commonly known as:  PROVENTIL  Take 3 mLs (2.5 mg total) by nebulization every 6 (six) hours as needed for wheezing or shortness of breath.     amoxicillin-clavulanate 875-125 MG tablet  Commonly known as:  AUGMENTIN  Take  1 tablet by mouth 2 (two) times daily. Please take for total of 6 days     aspirin EC 81 MG tablet  Take 81 mg by mouth daily.     ATROVENT HFA 17 MCG/ACT inhaler  Generic drug:  ipratropium  Inhale 2 puffs into the lungs every 6 (six) hours as needed for wheezing.     bacitracin ointment  Apply topically 2 (two) times daily. For one week then stop     budesonide 0.25 MG/2ML nebulizer solution  Commonly known as:  PULMICORT  Take 0.25 mg by nebulization every 6 (six) hours.     cyproheptadine 4 MG tablet  Commonly known as:  PERIACTIN  Take 4 mg by mouth daily.     feeding supplement (OSMOLITE 1.5 CAL) Liqd  Place 1,000 mLs into feeding tube continuous.     free water Soln  Place 200 mLs into feeding tube every 8 (eight) hours.     levothyroxine 100 MCG tablet  Commonly known as:  SYNTHROID, LEVOTHROID  Take 100 mcg by mouth daily.     linagliptin 5 MG Tabs tablet  Commonly known as:  TRADJENTA  Take 5 mg by mouth daily.     mirtazapine 30 MG tablet  Commonly known as:  REMERON  Take 0.5 tablets (15 mg total) by mouth at bedtime.     mometasone-formoterol 100-5 MCG/ACT Aero  Commonly known as:  DULERA  Inhale 2 puffs into the lungs 2 (two) times daily.     oxyCODONE 15 MG immediate release tablet  Commonly known as:  ROXICODONE  Take 1 tablet by mouth every 4 (four) hours.     tiotropium 18 MCG inhalation capsule  Commonly known as:  SPIRIVA  Place 18 mcg into inhaler and inhale 2 (two) times daily.          Diet and Activity recommendation: See Discharge Instructions above   Consults obtained -    Cardiothoracic surgery: Dr. Roxy Manns 10/10/2015  Major procedures and Radiology Reports - PLEASE review detailed and final reports for all details, in brief -   - gastrojejunostomy tube exchange   Dg Chest 2 View  10/12/2015  CLINICAL DATA:  Followup pneumothorax EXAM: CHEST - 2 VIEW COMPARISON:  10/11/2015 FINDINGS: Cardiac shadow is stable. A pacing  device is again seen and stable. The known left pneumothorax is again identified and stable with the predominately apical component. A small amount of pleural fluid and filled the space inferiorly. Mild basilar atelectasis is noted on the left as well. IMPRESSION: New left pleural effusion and left basilar atelectasis. The patient's known left pneumothorax is stable in appearance. Electronically Signed   By: Inez Catalina M.D.   On: 10/12/2015 08:54   X-ray Chest Pa And Lateral  10/11/2015  CLINICAL DATA:  Followup of pneumothorax. EXAM: CHEST  2 VIEW COMPARISON:  1 day prior FINDINGS: Single lead pacer, terminating in right ventricle. Midline trachea. Moderate cardiomegaly. Atherosclerosis in the transverse aorta. Right costophrenic angle minimally excluded. Left-sided pneumothorax is again identified. Slightly decreased, with the visceral pleural line 12 mm from the chest wall at the apex. 15 mm on the prior. No mediastinal shift. No congestive failure. Probable scarring and atelectasis at the left lung base. IMPRESSION: Decrease in 5-10% left-sided pneumothorax. Cardiomegaly without congestive failure. Electronically Signed   By: Abigail Miyamoto M.D.   On: 10/11/2015 08:17   Dg Chest 2 View  10/10/2015  CLINICAL DATA:  One day history of left-sided chest pain EXAM: CHEST  2 VIEW COMPARISON:  Chest radiograph September 18, 2014 and chest CT June 20, 2015 FINDINGS: There is a focal pneumothorax at the left base  without tension component. There is scarring and fibrosis in the left base. Lungs elsewhere clear. Heart size and pulmonary vascularity are normal. No adenopathy. Pacemaker lead is attached to the middle cardiac vein. No adenopathy. There is degenerative change in the thoracic spine. IMPRESSION: Pneumothorax left base without tension component. Scarring/ fibrosis left base. No edema or consolidation. Pacemaker lead attached to middle cardiac vein. Critical Value/emergent results were called by telephone  at the time of interpretation on 10/10/2015 at 12:29 pm to Dr. Sherwood Gambler , who verbally acknowledged these results. Electronically Signed   By: Lowella Grip III M.D.   On: 10/10/2015 12:30   Dg Abd 1 View  10/11/2015  CLINICAL DATA:  Leaking gastrostomy catheter, check positioning. EXAM: ABDOMEN - 1 VIEW COMPARISON:  None. FINDINGS: Scattered large and small bowel gas is noted. Contrast material was then injected through the patient's indwelling gastrostomy catheter which lies in the proximal duodenum just beyond the pyloric channel. The remainder of the catheter is looped within the stomach consistent with retrograde migration. IMPRESSION: Gastrostomy catheter within the stomach although the distal ileum which previously lied within the proximal jejunum is now coiled within the stomach. Electronically Signed   By: Inez Catalina M.D.   On: 10/11/2015 19:19   Ir Gj Tube Change  10/12/2015  CLINICAL DATA:  Jejunostomy tube exchange EXAM: JEJUNAL CATHETER REPLACEMENT FLUOROSCOPY TIME:  2 minutes and 43 seconds MEDICATIONS AND MEDICAL HISTORY: None ANESTHESIA/SEDATION: None CONTRAST:  10 cc Omnipaque 300 PROCEDURE: The procedure, risks, benefits, and alternatives were explained to the patient. Questions regarding the procedure were encouraged and answered. The patient understands and consents to the procedure. The abdomen was prepped with Betadine in a sterile fashion, and a sterile drape was applied covering the operative field. A sterile gown and sterile gloves were used for the procedure. Existing jejunostomy tube was exchanged for a new 24 French gastrojejunostomy tube over a stiff glidewire. The tip was advanced into the jejunum. Contrast was injected. FINDINGS: The tip of the tube is in the jejunum. COMPLICATIONS: None IMPRESSION: Successful gastrojejunostomy tube exchange. Tip is in the jejunum. It is ready for use. Electronically Signed   By: Marybelle Killings M.D.   On: 10/12/2015 15:44   Ir Gj  Tube Change  09/20/2015  CLINICAL DATA:  Damaged leaking gastrojejunostomy. Chronic tube feeds. EXAM: FLUOROSCOPIC EXCHANGE OF THE 24 FRENCH GASTROJEJUNOSTOMY Date:  9/20/20169/20/2016 3:39 pm Radiologist:  M. Daryll Brod, MD Guidance:  Fluoroscopic FLUOROSCOPY TIME:  2 minutes 30 seconds, 54 mGy MEDICATIONS AND MEDICAL HISTORY: None. ANESTHESIA/SEDATION: None CONTRAST:  35mL OMNIPAQUE IOHEXOL 300 MG/ML  SOLN COMPLICATIONS: None immediate PROCEDURE: Informed consent was obtained from the patient following explanation of the procedure, risks, benefits and alternatives. The patient understands, agrees and consents for the procedure. All questions were addressed. A time out was performed. Maximal barrier sterile technique utilized including caps, mask, sterile gowns, sterile gloves, large sterile drape, hand hygiene, and Betadine. Under sterile conditions, the existing damaged GJ tube was cut and removed over a Glidewire. A C2 catheter and Glidewire were utilized to manipulate the access into the proximal jejunum. Over the glidewire, a new 24 [place] GJ tube was advanced. Retention balloon inflated with 9 cc saline and retracted against the anterior gastric wall. Contrast injection confirms position. Images obtained for documentation. Feeding tube ready for use. IMPRESSION: Successful fluoroscopic exchange of the 24 French gastrojejunostomy ready for use. Electronically Signed   By: Jerilynn Mages.  Shick M.D.   On: 09/20/2015 16:33  Micro Results     No results found for this or any previous visit (from the past 240 hour(s)).     Today   Subjective:   [name] today has no headache,no chest abdominal pain,no new weakness tingling or numbness, feels much bettertoday.   Objective:   Blood pressure 131/54, pulse 54, temperature 97.6 F (36.4 C), temperature source Oral, resp. rate 18, height 5\' 9"  (1.753 m), weight 57.4 kg (126 lb 8.7 oz), SpO2 97 %.   Intake/Output Summary (Last 24 hours) at 10/13/15  1122 Last data filed at 10/13/15 0900  Gross per 24 hour  Intake    220 ml  Output    375 ml  Net   -155 ml    Exam  General: NAD  Cardiovascular: RRR  Respiratory: CTAB  Abdomen: Soft, nontender, nondistended, positive bowel sounds.   Musculoskeletal: No clubbing cyanosis or edema.  Data Review   CBC w Diff:  Lab Results  Component Value Date   WBC 6.2 10/11/2015   WBC 4.3 04/08/2012   HGB 13.6 10/11/2015   HGB 12.5* 04/08/2012   HCT 40.3 10/11/2015   HCT 36.7* 04/08/2012   PLT 65* 10/11/2015   PLT 86* 04/08/2012   LYMPHOPCT 14 06/20/2015   LYMPHOPCT 29.7 04/08/2012   MONOPCT 12 06/20/2015   MONOPCT 15.1* 04/08/2012   EOSPCT 1 06/20/2015   EOSPCT 3.7 04/08/2012   BASOPCT 0 06/20/2015   BASOPCT 0.5 04/08/2012    CMP:  Lab Results  Component Value Date   NA 134* 10/11/2015   K 4.0 10/11/2015   CL 100* 10/11/2015   CO2 28 10/11/2015   BUN 23* 10/11/2015   CREATININE 0.71 10/11/2015   PROT 7.1 04/01/2015   ALBUMIN 3.7 04/01/2015   BILITOT 1.4* 04/01/2015   ALKPHOS 86 04/01/2015   AST 34 04/01/2015   ALT 14 04/01/2015  .   Total Time in preparing paper work, data evaluation and todays exam - 35 minutes  ELGERGAWY, DAWOOD M.D on 10/13/2015 at [place]  [phone]

## 2015-10-13 NOTE — Discharge Instructions (Signed)
Follow with Primary MD [name],ARENNETTE, NP in 7 days   Get CBC, CMP, 2 view Chest X ray checked  by Primary MD next visit.    Activity: As tolerated with Full fall precautions use walker/cane & assistance as needed   Disposition Home    Diet: Dysphagia 1 with thin liquids , with feeding assistance and aspiration precautions. Continue with tube feeding via PEG as previously done prior to admission. For Heart failure patients - Check your Weight same time everyday, if you gain over 2 pounds, or you develop in leg swelling, experience more shortness of breath or chest pain, call your Primary MD immediately. Follow Cardiac Low Salt Diet and 1.5 lit/day fluid restriction.   On your next visit with your primary care physician please Get Medicines reviewed and adjusted.   Please request your Prim.MD to go over all Hospital Tests and Procedure/Radiological results at the follow up, please get all Hospital records sent to your Prim MD by signing hospital release before you go home.   If you experience worsening of your admission symptoms, develop shortness of breath, life threatening emergency, suicidal or homicidal thoughts you must seek medical attention immediately by calling 911 or calling your MD immediately  if symptoms less severe.  You Must read complete instructions/literature along with all the possible adverse reactions/side effects for all the Medicines you take and that have been prescribed to you. Take any new Medicines after you have completely understood and accpet all the possible adverse reactions/side effects.   Do not drive, operating heavy machinery, perform activities at heights, swimming or participation in water activities or provide baby sitting services if your were admitted for syncope or siezures until you have seen by Primary MD or a Neurologist and advised to do so again.  Do not drive when taking Pain medications.    Do not take more than prescribed Pain, Sleep  and Anxiety Medications  Special Instructions: If you have smoked or chewed Tobacco  in the last 2 yrs please stop smoking, stop any regular Alcohol  and or any Recreational drug use.  Wear Seat belts while driving.   Please note  You were cared for by a hospitalist during your hospital stay. If you have any questions about your discharge medications or the care you received while you were in the hospital after you are discharged, you can call the unit and asked to speak with the hospitalist on call if the hospitalist that took care of you is not available. Once you are discharged, your primary care physician will handle any further medical issues. Please note that NO REFILLS for any discharge medications will be authorized once you are discharged, as it is imperative that you return to your primary care physician (or establish a relationship with a primary care physician if you do not have one) for your aftercare needs so that they can reassess your need for medications and monitor your lab values.

## 2015-10-13 NOTE — Progress Notes (Signed)
Pt discharged home in care of family. Pt will have home health nurse follow up.

## 2015-10-17 ENCOUNTER — Other Ambulatory Visit (HOSPITAL_COMMUNITY): Payer: Self-pay | Admitting: Interventional Radiology

## 2015-10-17 ENCOUNTER — Ambulatory Visit (HOSPITAL_COMMUNITY)
Admission: RE | Admit: 2015-10-17 | Discharge: 2015-10-17 | Disposition: A | Discharge status: Home / Self Care | Payer: Medicare Other | Source: Ambulatory Visit | Attending: Interventional Radiology | Admitting: Interventional Radiology

## 2015-10-17 DIAGNOSIS — R1310 Dysphagia, unspecified: Secondary | ICD-10-CM

## 2015-10-17 DIAGNOSIS — Z434 Encounter for attention to other artificial openings of digestive tract: Secondary | ICD-10-CM | POA: Diagnosis not present

## 2015-10-17 MED ORDER — IOHEXOL 300 MG/ML  SOLN
50.0000 mL | Freq: Once | INTRAMUSCULAR | Status: DC | PRN
  Administered 2015-10-17: 20 mL
  Filled 2015-10-17: qty 50

## 2015-10-17 MED ORDER — LIDOCAINE VISCOUS 2 % MT SOLN
OROMUCOSAL | Status: AC
  Filled 2015-10-17: qty 15

## 2015-10-28 ENCOUNTER — Other Ambulatory Visit: Payer: Self-pay | Admitting: Thoracic Surgery (Cardiothoracic Vascular Surgery)

## 2015-10-28 DIAGNOSIS — J939 Pneumothorax, unspecified: Secondary | ICD-10-CM

## 2015-10-31 ENCOUNTER — Ambulatory Visit (INDEPENDENT_AMBULATORY_CARE_PROVIDER_SITE_OTHER): Payer: Medicare Other | Admitting: Physician Assistant

## 2015-10-31 ENCOUNTER — Ambulatory Visit: Payer: Medicare Other | Admitting: Thoracic Surgery (Cardiothoracic Vascular Surgery)

## 2015-10-31 ENCOUNTER — Ambulatory Visit
Admission: RE | Admit: 2015-10-31 | Discharge: 2015-10-31 | Disposition: A | Discharge status: Home / Self Care | Payer: Medicare Other | Source: Ambulatory Visit | Attending: Thoracic Surgery (Cardiothoracic Vascular Surgery) | Admitting: Thoracic Surgery (Cardiothoracic Vascular Surgery)

## 2015-10-31 VITALS — BP 106/63 | HR 65 | Resp 16 | Ht 69.0 in | Wt 122.0 lb

## 2015-10-31 DIAGNOSIS — J939 Pneumothorax, unspecified: Secondary | ICD-10-CM

## 2015-10-31 NOTE — Progress Notes (Signed)
  HPI:  [name] presents today for follow up from a Pneumothorax.  He has multiple medical problems and presented to the ED on 10/10/2015 with complains of left sided chest pain and shortness of breath.  CXR showed a small left pneumothorax.  This did not require chest tube placement and he was observed in the hospital.  Since discharge patient states he is doing okay.  He does complain of shortness of breath which is about the same since his presentation.  He denies further chest pain.    Current Outpatient Prescriptions  Medication Sig Dispense Refill  . albuterol (PROVENTIL) (2.5 MG/3ML) 0.083% nebulizer solution Take 3 mLs (2.5 mg total) by nebulization every 6 (six) hours as needed for wheezing or shortness of breath. 75 mL 12  . aspirin EC 81 MG tablet Take 81 mg by mouth daily.    . ATROVENT HFA 17 MCG/ACT inhaler Inhale 2 puffs into the lungs every 6 (six) hours as needed for wheezing.     . budesonide (PULMICORT) 0.25 MG/2ML nebulizer solution Take 0.25 mg by nebulization every 6 (six) hours.     . cyproheptadine (PERIACTIN) 4 MG tablet Take 4 mg by mouth daily.    Marland Kitchen levothyroxine (SYNTHROID, LEVOTHROID) 100 MCG tablet Take 100 mcg by mouth daily.    Marland Kitchen linagliptin (TRADJENTA) 5 MG TABS tablet Take 5 mg by mouth daily.     . mirtazapine (REMERON) 30 MG tablet Take 0.5 tablets (15 mg total) by mouth at bedtime. 30 tablet 2  . mometasone-formoterol (DULERA) 100-5 MCG/ACT AERO Inhale 2 puffs into the lungs 2 (two) times daily. 1 Inhaler 2  . Nutritional Supplements (FEEDING SUPPLEMENT, OSMOLITE 1.5 CAL,) LIQD Place 1,000 mLs into feeding tube continuous. 1000 mL 0  . oxyCODONE (ROXICODONE) 15 MG immediate release tablet Take 1 tablet by mouth every 4 (four) hours.  0  . tiotropium (SPIRIVA) 18 MCG inhalation capsule Place 18 mcg into inhaler and inhale 2 (two) times daily.    . Water For Irrigation, Sterile (FREE WATER) SOLN Place 200 mLs into feeding tube every 8 (eight) hours.     No  current facility-administered medications for this visit.    Physical Exam:  BP 106/63 mmHg  Pulse 65  Resp 16  Ht 5\' 9"  (1.753 m)  Wt 122 lb (55.339 kg)  BMI 18.01 kg/m2  SpO2 95%  Gen: no apparent distress Heart: RRR Lungs: CTA bilaterally  Diagnostic Tests:  CXR: no pneumothorax, small left pleural effusion  A/P  1. Resolution of Pneumothorax- patient educated to contact our office or to report the ED if he develops severe chest pain and worsening shortness of breath 2. Small left pleural effusion- this is no large enough to drain, should resolve with time 3. Dispo- RTC prn  Ellwood Handler, PA-C [facility] [phone]

## 2015-11-09 ENCOUNTER — Other Ambulatory Visit: Payer: Self-pay | Admitting: Ophthalmology

## 2015-11-09 ENCOUNTER — Encounter: Payer: Self-pay | Admitting: Cardiovascular Disease

## 2015-11-10 ENCOUNTER — Telehealth: Payer: Self-pay | Admitting: Cardiovascular Disease

## 2015-11-10 NOTE — Telephone Encounter (Signed)
Pt is seeing Dr Oval Linsey tomorrow. Would you please fax over his clarence asap-[phone].

## 2015-11-11 ENCOUNTER — Encounter: Payer: Self-pay | Admitting: Cardiovascular Disease

## 2015-11-11 ENCOUNTER — Ambulatory Visit (INDEPENDENT_AMBULATORY_CARE_PROVIDER_SITE_OTHER): Payer: Medicare Other | Admitting: Cardiovascular Disease

## 2015-11-11 ENCOUNTER — Encounter (HOSPITAL_COMMUNITY): Payer: Self-pay | Admitting: *Deleted

## 2015-11-11 VITALS — BP 108/46 | HR 58 | Ht 69.0 in | Wt 126.3 lb

## 2015-11-11 DIAGNOSIS — I6523 Occlusion and stenosis of bilateral carotid arteries: Secondary | ICD-10-CM

## 2015-11-11 DIAGNOSIS — I482 Chronic atrial fibrillation, unspecified: Secondary | ICD-10-CM

## 2015-11-11 DIAGNOSIS — Z950 Presence of cardiac pacemaker: Secondary | ICD-10-CM

## 2015-11-11 DIAGNOSIS — I10 Essential (primary) hypertension: Secondary | ICD-10-CM | POA: Diagnosis not present

## 2015-11-11 HISTORY — DX: Presence of cardiac pacemaker: Z95.0

## 2015-11-11 NOTE — Progress Notes (Signed)
Anesthesia Chart Review: SAME DAY WORK-UP.  Patient is a 81 year old male scheduled for pars plana vitrectomy, right on 11/14/15 by Dr. Zadie Rhine.  Being seen by cardiologist Dr. Oval Linsey today for pre-operative clearance. Her note is not yet signed, but under patient instructions from his office visit, she writes, "You have been cleared for surgery." She is recommending three month follow-up with Dr. Gwenlyn Found.  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 Jude single chamber PPM 12/07/14 for bradycardia with syncope ([facility]) carotid occlusive disease with known RICA occlusion and s/p LICA stent '02 (Dr. Trixie Dredge, [facility]), hepatocellular carcinoma s/p RLL liver resection '12, thrombocytopenia, DM2, hypothyroidism, exertional dyspnea, COPD Gold C, severe malnutrition, dysphagia s/p gastric jejunal feeding tube '13 (catheter exchange 10/17/15), small left PTX 10/10/15 (not requiring CT), s/p posterior C3-5 fusion '13.  Seen for left PTX follow-up at [facility] on 10/31/15. CXR showed COPD, no PTX, stable small left pleural effusion. PRN CT surgery follow-up recommended.   PCP is Conni Slipper, NP.  Current meds listed include albuterol, ASA, Atrovent, Pulmicort, Periactin, levothyroxine, Tradjenta, Remeron, Dulera, Spiriva, oxycodone. Med list will have to be verified prior to surgery.   11/11/15 EKG:  PENDING from [facility]. 10/10/15 tracing showed afib at 44 bpm, LAFB, old anterior infarct.   02/08/15 Echo ([facility], Care Everywhere): INTERPRETATION --------------------------------------------------------------- MILD LV DYSFUNCTION (See above) WITH MILD LVH NORMAL RIGHT VENTRICULAR SYSTOLIC FUNCTION VALVULAR REGURGITATION: TRIVIAL AR, MILD MR, TRIVIAL PR, MILD TR NO VALVULAR  STENOSIS LVEF 45-50% MILD TO MODERATE MR Compared with prior Echo study on 12/08/2014: LVEF DECREASED AND MR INCREASED  According to 10/31/12 inpatient cardiology consult note, patient had "Myoview in April 2012 was low risk."   12/14/14 Carotid duplex: Known right ICA occlusion. 50-69% left ICA stenosis. Right vertebral artery appeared occluded. Normal antegrade flow in left VA. Left ECA with significantly elevated velocities.  10/19/14 PFTs ([facility]; Care Everywhere): FVC 2.02 (49%), FEV1 1.38 (44%), DLCO 8.1 (35%). Comments: PATIENT DIFFICULTY WITH ALL MANEUVERS; BEST FVC ACCEPTED DIFFICULTY WITH MOUTH SEAL. Interpretation: Spirometry with flow volume loop demonstrates no airway obstruction. The reduced vital capacity in the presence of a normal total lung capacity may reflect early restriction, neuromuscular weakness, chest wall abnormalities,or poor effort. Lung volumes were determined by inert gas methodology which suggested impaired intrapulmonary gas mixing. The reduction in maximum voluntary ventilation reflects either patient difficulty with the breathing maneuver or neuromuscular dysfunction. The diffusing capacity for carbon monoxide, a reflection of alveolar-capillary gas transport, is substantially reduced.  02/08/15 CXR ([facility], Care Everywhere):   He is for labs on arrival. PLT count 65-94K since 08/2014.    Patient evaluation and review of labs by his anesthesiologist and surgeon on the day of surgery. If labs are acceptable for this procedure and otherwise no acute changes then I would anticipate that he could proceed as planned.   George Hugh [facility] Short Stay Center/Anesthesiology Phone [phone] 11/11/2015 3:42 PM

## 2015-11-11 NOTE — Progress Notes (Signed)
[name] for [facility] paged to let him know of pts upcoming surgery.

## 2015-11-11 NOTE — Patient Instructions (Signed)
You have been cleared for surgery.  Dr Oval Linsey recommends that you schedule a follow-up appointment in 3 months WITH DR BERRY.  Dr Oval Linsey recommends that you schedule a follow-up appointment in the device clinic FIRST AVAILABLE. Patient has a Engineer, petroleum implanted in December and it hasn't been checked.

## 2015-11-11 NOTE — Progress Notes (Signed)
Cardiology Office Note   Date:  11/11/2015   ID:  [name], DOB September 13, 1931, MRN [id number]  PCP:  Conni Slipper, NP  Cardiologist:   Dr. Gwenlyn Found  Chief Complaint  Patient presents with  . Surgical Clearance    Right eye surgery, surgeon Dr Deloria Lair , scheduled for Mmonday November 14th. patient reports shortness of breaht with exertion.      History of Present Illness: [name] is a 81 y.o. male with atrial flutter status post ablation, chronic atrial fibrillation, hypertension, peripheral vascular disease status post carotid stenting, descending aortic aneurysm dissection, diabetes, hyperlipidemia and orthostatic hypotension who presents for presurgical risk assessment.  If that is been feeling well. He denies any chest pain but does get shortness of breath with exertion. This is long-standing and into his underlying lung disease. He denies any lower extremity edema, orthopnea or PND.  He does not get much exercise due to his shortness of breath.  [name] was recently admitted to the hospital October with a pneumothorax.  He was managed medically and did not require placement of a chest tube. He followed up with thoracic surgery at the end of October and the pneumothorax had resolved.  During the hospitalization he was also noted to have bradycardia (40-55 bpm) with pauses up to 3.2 seconds.  These episodes were asymptomatic. He did have a St. Jude pacemaker implanted . He does not think that his device has been interrogated since that time.  [name] has chronic atrial fibrillation. He has been deemed not to be a candidate for anticoagulation due to advanced age and falls risk. He has complete occlusion of the right internal carotid artery and moderate stenosis of the left carotid artery.   Past Medical History  Diagnosis Date  . Hyperlipidemia   . Hypertension   . [place] (hepatocellular carcinoma) ([place])     s/p right lobe resection 2012  . Peripheral vascular  disease ([place])     LICA stent [id number]  . Pneumonia 11/03/2012    aspiration pneumonias  . COPD (chronic obstructive pulmonary disease) ([facility])     Gold C  . Thrombocytopenia ([place])   . Severe malnutrition ([place])   . Dysphagia     s/p PEG placement  . Paroxysmal atrial flutter ([facility])     and fibrillation with known bradycardia, Chads2vasc of 4, not on A/C due to thrombocytopenia and functional status  . Dissecting aortic aneurysm, abdominal ([place]) 11/13/2011    Overview:  Severe aortoiliac stenosis with possible a chronic dissection versus  ulcerated plaque/thrombus extending above and below the aortic  bifurcation; without aneurysmal dilation  as per CT abdomen noted 09/25/2011   . DM2 (diabetes mellitus, type 2) ([place])     takes Tradjenta daily  . Hypothyroidism     takes Synthroid daily   . Coronary artery disease   . Shortness of breath dyspnea     with exertion   . Back pain   . Pneumothorax 10/2015  . Presence of permanent cardiac pacemaker   . Pacemaker 11/11/2015    St. Jude single chamber pacemaker implanted at [facility] 11/2014.    Past Surgical History  Procedure Laterality Date  . Carotid stent  2002  . Liver canc    . Posterior cervical fusion/foraminotomy  09/25/2012    Procedure: POSTERIOR CERVICAL FUSION/FORAMINOTOMY LEVEL 2;  Surgeon: Eustace Moore, MD;  Location: [place] [facility]UR[facility]S;  Service: Neurosurgery;  Laterality: N/A;  Posterior Cervical three-four laminectomy, posterior cervical three-four, four-five  fusion  . Esophagogastroduodenoscopy  10/01/2012    Procedure: ESOPHAGOGASTRODUODENOSCOPY (EGD);  Surgeon: Gwenyth Ober, MD;  Location: [place];  Service: General;  Laterality: N/A;  wyatt/leone  . Peg placement  10/01/2012    Procedure: PERCUTANEOUS ENDOSCOPIC GASTROSTOMY (PEG) PLACEMENT;  Surgeon: Gwenyth Ober, MD;  Location: [place];  Service: General;  Laterality: N/A;  . Esophagogastroduodenoscopy  10/31/2012    Procedure: ESOPHAGOGASTRODUODENOSCOPY (EGD);  Surgeon:  Beryle Beams, MD;  Location: [facility] ENDOSCOPY;  Service: Endoscopy;  Laterality: N/A;  . Carotid angiogram  2012  . Doppler echocardiography  2011  . Peg tube placement  10/12/2015       replacment tube  . Cataract surgery       bilateral  . Colonoscopy    . Pacemaker inserted       Current Outpatient Prescriptions  Medication Sig Dispense Refill  . albuterol (PROVENTIL) (2.5 MG/3ML) 0.083% nebulizer solution Take 3 mLs (2.5 mg total) by nebulization every 6 (six) hours as needed for wheezing or shortness of breath. 75 mL 12  . aspirin EC 81 MG tablet Place 81 mg into feeding tube daily.     . budesonide (PULMICORT) 0.25 MG/2ML nebulizer solution Take 0.25 mg by nebulization every 6 (six) hours.     . cyproheptadine (PERIACTIN) 4 MG tablet Place 4 mg into feeding tube daily.     Marland Kitchen levothyroxine (SYNTHROID, LEVOTHROID) 100 MCG tablet Place 100 mcg into feeding tube daily.     Marland Kitchen linagliptin (TRADJENTA) 5 MG TABS tablet Place 5 mg into feeding tube daily.     . mirtazapine (REMERON) 30 MG tablet Take 0.5 tablets (15 mg total) by mouth at bedtime. (Patient taking differently: Place 15 mg into feeding tube at bedtime. ) 30 tablet 2  . mometasone-formoterol (DULERA) 100-5 MCG/ACT AERO Inhale 2 puffs into the lungs 2 (two) times daily. 1 Inhaler 2  . Nutritional Supplements (FEEDING SUPPLEMENT, OSMOLITE 1.5 CAL,) LIQD Place 1,000 mLs into feeding tube continuous. (Patient taking differently: Place 1,896 mLs into feeding tube daily. 8 cans) 1000 mL 0  . ofloxacin (OCUFLOX) 0.3 % ophthalmic solution Place 1 drop into the right eye 4 (four) times daily.    Marland Kitchen oxyCODONE (ROXICODONE) 15 MG immediate release tablet Place 7.5-15 mg into feeding tube See admin instructions. Take 1/2 - 1 tablet by feeding tube twice daily (depending on pain level), may take an additional 1/2 -1 tablet at noon if needed for pain  0  . polyethylene glycol (MIRALAX / GLYCOLAX) packet Place 8.5 g into feeding tube 2 (two) times  daily.     Marland Kitchen tiotropium (SPIRIVA) 18 MCG inhalation capsule Place 18 mcg into inhaler and inhale 2 (two) times daily.    . Water For Irrigation, Sterile (FREE WATER) SOLN Place 200 mLs into feeding tube every 8 (eight) hours. (Patient taking differently: Place 500 mLs into feeding tube every 8 (eight) hours. )    . Difluprednate (DUREZOL) 0.05 % EMUL Place 1 drop into the right eye See admin instructions. Instil 1 drop into right eye twice daily on 11/12 & 11/13 (Sat & Sun before procedure) and 11/15 & 11/16 (Tues & Wed after procedure)    . ipratropium (ATROVENT HFA) 17 MCG/ACT inhaler Inhale 2 puffs into the lungs every 6 (six) hours as needed for wheezing.     No current facility-administered medications for this visit.    Allergies:   Review of patient's allergies indicates no known allergies.    Social History:  The patient  reports that he has quit smoking. His smoking use included Cigarettes. He smoked 0.00 packs per day. He has never used smokeless tobacco. He reports that he does not drink alcohol or use illicit drugs.   Family History:  The patient's family history includes Cancer in his father; Liver disease in his brother. There is no history of CAD.    ROS:  Please see the history of present illness.   Otherwise, review of systems are positive for none.   All other systems are reviewed and negative.    PHYSICAL EXAM: VS:  BP 108/46 mmHg  Pulse 58  Ht 5\' 9"  (1.753 m)  Wt 57.289 kg (126 lb 4.8 oz)  BMI 18.64 kg/m2 , BMI Body mass index is 18.64 kg/(m^2). GENERAL:  Well appearing HEENT:  Pupils equal round and reactive, fundi not visualized, oral mucosa unremarkable NECK:  No jugular venous distention, waveform within normal limits, carotid upstroke brisk and symmetric, no bruits, no thyromegaly LYMPHATICS:  No cervical adenopathy LUNGS:  Clear to auscultation bilaterally HEART:  Irregularly irregular.  PMI not displaced or sustained,S1 and S2 within normal limits, no S3, no  S4, no clicks, no rubs, no murmurs ABD:  Flat, positive bowel sounds normal in frequency in pitch, no bruits, no rebound, no guarding, no midline pulsatile mass, no hepatomegaly, no splenomegaly EXT:  2 plus pulses throughout, no edema, no cyanosis no clubbing SKIN:  No rashes no nodules NEURO:  Cranial nerves II through XII grossly intact, motor grossly intact throughout PSYCH:  Cognitively intact, oriented to person place and time    EKG:  EKG is ordered today. The ekg ordered today demonstrates atrial fibrillation rate 58 bpm. Intermittent ventricular pacing. Suspect under sensing.   Recent Labs: 04/01/2015: ALT 14 10/11/2015: BUN 23*; Creatinine, Ser 0.71; Hemoglobin 13.6; Platelets 65*; Potassium 4.0; Sodium 134*    Lipid Panel    Component Value Date/Time   CHOL 72 11/03/2012 1438   TRIG 80 11/03/2012 1438   HDL 16* 11/03/2012 1438   CHOLHDL 4.5 11/03/2012 1438   VLDL 16 11/03/2012 1438   LDLCALC 40 11/03/2012 1438      Wt Readings from Last 3 Encounters:  11/11/15 57.289 kg (126 lb 4.8 oz)  11/11/15 55.339 kg (122 lb)  10/31/15 55.339 kg (122 lb)      ASSESSMENT AND PLAN:  # Pre surgical risk assessment: Mr. Precious Bard presents for risk assessment prior to surgery on his right eye. He is unclear exactly what the surgery is, but states it's for the back of his eyes. Is not a cataract surgery. Although he is in atrial fibrillation, he is not on any anticoagulants that would need to be held prior to surgery. Overall ocular surgeries are considered low risk. Although he has several cardiovascular risk factors including hypertension and hyperlipidemia, as well as known carotid artery disease, overall he is considered low risk for this low risk surgery. He does not require any further testing at this time.  Electrocautery will be necessary for this procedure I would recommend short bursts rather than impulses,this could inhibit his pacemaker from pacing appropriately.  Alternatively,  the pacemaker can be programmed VOO during the procedure.  # Hypertension: Blood pressure well controlled not on any medications.  # Atrial fibrillation: Slow ventricular response. Only on aspirin for anticoagulation due to falls risk. 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   This patients CHA2DS2-VASc Score and unadjusted Ischemic Stroke Rate (% per year) is equal to  4.8 % stroke rate/year from a score of 4  Above score calculated as 1 point each if present [CHF, HTN, DM, Vascular=MI/PAD/Aortic Plaque, Age if 65-74, or Male] Above score calculated as 2 points each if present [Age > 75, or Stroke/TIA/TE]  # Carotid artery disease: Mr. Bosserman has complete occlusion of the right carotid and moderate stenosis of the left. He follows with Dr. Gwenlyn Found for this annually. He is on aspirin but not a statin presumably due to age we'll defer to Dr. Gwenlyn Found.  # PPM: St. Jude pacemaker implanted at [facility] in December 2015. Mr. Schulze is unclear whether this has been interrogated since that time. Based on his EKG today it appears that it may be undersensing. We have set him up in the device clinic to have this interrogated, as we do not have a St. Glass blower/designer present today.  Current medicines are reviewed at length with the patient today.  The patient does not have concerns regarding medicines.  The following changes have been made:  no change  Labs/ tests ordered today include:  No orders of the defined types were placed in this encounter.     Disposition:   FU with Dr. Gwenlyn Found in 3 months    Signed, Sharol Harness, MD  11/11/2015 5:42 PM    [facility]

## 2015-11-11 NOTE — Telephone Encounter (Signed)
Patient is cleared for right eye surgery by Dr Deloria Lair scheduled for 11/14/15 per Dr Berneice Gandy.  Clearance form faxed to [place] (Dr Delanna Ahmadi).

## 2015-11-14 ENCOUNTER — Encounter (HOSPITAL_COMMUNITY): Payer: Self-pay | Admitting: *Deleted

## 2015-11-14 ENCOUNTER — Encounter (HOSPITAL_COMMUNITY)
Admission: RE | Disposition: A | Discharge status: Home / Self Care | Payer: Self-pay | Source: Ambulatory Visit | Attending: Ophthalmology

## 2015-11-14 ENCOUNTER — Ambulatory Visit (HOSPITAL_COMMUNITY)
Admission: RE | Admit: 2015-11-14 | Discharge: 2015-11-14 | Disposition: A | Discharge status: Home / Self Care | Payer: Medicare Other | Source: Ambulatory Visit | Attending: Ophthalmology | Admitting: Ophthalmology

## 2015-11-14 ENCOUNTER — Ambulatory Visit (HOSPITAL_COMMUNITY): Payer: Medicare Other | Admitting: Vascular Surgery

## 2015-11-14 DIAGNOSIS — Z7982 Long term (current) use of aspirin: Secondary | ICD-10-CM | POA: Diagnosis not present

## 2015-11-14 DIAGNOSIS — Z7951 Long term (current) use of inhaled steroids: Secondary | ICD-10-CM | POA: Diagnosis not present

## 2015-11-14 DIAGNOSIS — Z79891 Long term (current) use of opiate analgesic: Secondary | ICD-10-CM | POA: Insufficient documentation

## 2015-11-14 DIAGNOSIS — Z79899 Other long term (current) drug therapy: Secondary | ICD-10-CM | POA: Diagnosis not present

## 2015-11-14 DIAGNOSIS — Z87891 Personal history of nicotine dependence: Secondary | ICD-10-CM | POA: Diagnosis not present

## 2015-11-14 DIAGNOSIS — Z8505 Personal history of malignant neoplasm of liver: Secondary | ICD-10-CM | POA: Insufficient documentation

## 2015-11-14 DIAGNOSIS — J449 Chronic obstructive pulmonary disease, unspecified: Secondary | ICD-10-CM | POA: Diagnosis not present

## 2015-11-14 DIAGNOSIS — H548 Legal blindness, as defined in USA: Secondary | ICD-10-CM | POA: Diagnosis not present

## 2015-11-14 DIAGNOSIS — E1151 Type 2 diabetes mellitus with diabetic peripheral angiopathy without gangrene: Secondary | ICD-10-CM | POA: Diagnosis not present

## 2015-11-14 DIAGNOSIS — H33321 Round hole, right eye: Secondary | ICD-10-CM | POA: Diagnosis not present

## 2015-11-14 DIAGNOSIS — I2510 Atherosclerotic heart disease of native coronary artery without angina pectoris: Secondary | ICD-10-CM | POA: Diagnosis not present

## 2015-11-14 DIAGNOSIS — H3530 Unspecified macular degeneration: Secondary | ICD-10-CM | POA: Insufficient documentation

## 2015-11-14 DIAGNOSIS — Z7984 Long term (current) use of oral hypoglycemic drugs: Secondary | ICD-10-CM | POA: Diagnosis not present

## 2015-11-14 DIAGNOSIS — I480 Paroxysmal atrial fibrillation: Secondary | ICD-10-CM | POA: Insufficient documentation

## 2015-11-14 DIAGNOSIS — H43391 Other vitreous opacities, right eye: Secondary | ICD-10-CM | POA: Insufficient documentation

## 2015-11-14 DIAGNOSIS — I10 Essential (primary) hypertension: Secondary | ICD-10-CM | POA: Diagnosis not present

## 2015-11-14 DIAGNOSIS — E785 Hyperlipidemia, unspecified: Secondary | ICD-10-CM | POA: Diagnosis not present

## 2015-11-14 DIAGNOSIS — H5461 Unqualified visual loss, right eye, normal vision left eye: Secondary | ICD-10-CM | POA: Diagnosis present

## 2015-11-14 DIAGNOSIS — E039 Hypothyroidism, unspecified: Secondary | ICD-10-CM | POA: Diagnosis not present

## 2015-11-14 HISTORY — PX: PARS PLANA VITRECTOMY: SHX2166

## 2015-11-14 HISTORY — DX: Atherosclerotic heart disease of native coronary artery without angina pectoris: I25.10

## 2015-11-14 HISTORY — DX: Reserved for inherently not codable concepts without codable children: IMO0001

## 2015-11-14 HISTORY — DX: Presence of cardiac pacemaker: Z95.0

## 2015-11-14 HISTORY — DX: Hypothyroidism, unspecified: E03.9

## 2015-11-14 HISTORY — DX: Dorsalgia, unspecified: M54.9

## 2015-11-14 LAB — BASIC METABOLIC PANEL
Anion gap: 8 (ref 5–15)
BUN: 27 mg/dL — AB (ref 6–20)
CHLORIDE: 98 mmol/L — AB (ref 101–111)
CO2: 32 mmol/L (ref 22–32)
CREATININE: 0.77 mg/dL (ref 0.61–1.24)
Calcium: 9.2 mg/dL (ref 8.9–10.3)
GFR calc Af Amer: >60 mL/min (ref >60)
GFR calc non Af Amer: >60 mL/min (ref >60)
GLUCOSE: 144 mg/dL — AB (ref 65–99)
Potassium: 4.3 mmol/L (ref 3.5–5.1)
SODIUM: 138 mmol/L (ref 135–145)

## 2015-11-14 LAB — GLUCOSE, CAPILLARY
GLUCOSE-CAPILLARY: 129 mg/dL — AB (ref 65–99)
GLUCOSE-CAPILLARY: 117 mg/dL — AB (ref 65–99)

## 2015-11-14 LAB — CBC
HCT: 42.3 % (ref 39.0–52.0)
HEMOGLOBIN: 14.2 g/dL (ref 13.0–17.0)
MCH: 31.9 pg (ref 26.0–34.0)
MCHC: 33.6 g/dL (ref 30.0–36.0)
MCV: 95.1 fL (ref 78.0–100.0)
Platelets: 77 10*3/uL — ABNORMAL LOW (ref 150–400)
RBC: 4.45 MIL/uL (ref 4.22–5.81)
RDW: 13.8 % (ref 11.5–15.5)
WBC: 4.7 10*3/uL (ref 4.0–10.5)

## 2015-11-14 SURGERY — PARS PLANA VITRECTOMY WITH 25 GAUGE
Anesthesia: Monitor Anesthesia Care | Site: Eye | Laterality: Right

## 2015-11-14 MED ORDER — NA CHONDROIT SULF-NA HYALURON 40-30 MG/ML IO SOLN
INTRAOCULAR | Status: AC
  Filled 2015-11-14: qty 0.5

## 2015-11-14 MED ORDER — PHENYLEPHRINE HCL 2.5 % OP SOLN
1.0000 [drp] | OPHTHALMIC | Status: AC | PRN
  Administered 2015-11-14 (×3): 1 [drp] via OPHTHALMIC
  Filled 2015-11-14: qty 2

## 2015-11-14 MED ORDER — FENTANYL CITRATE (PF) 250 MCG/5ML IJ SOLN
INTRAMUSCULAR | Status: AC
  Filled 2015-11-14: qty 5

## 2015-11-14 MED ORDER — GENTAMICIN SULFATE 40 MG/ML IJ SOLN
INTRAMUSCULAR | Status: AC
  Filled 2015-11-14: qty 2

## 2015-11-14 MED ORDER — DEXMEDETOMIDINE HCL IN NACL 200 MCG/50ML IV SOLN
INTRAVENOUS | Status: AC
  Filled 2015-11-14: qty 50

## 2015-11-14 MED ORDER — FENTANYL CITRATE (PF) 100 MCG/2ML IJ SOLN: 25.0000 ug | INTRAMUSCULAR | Status: DC | PRN

## 2015-11-14 MED ORDER — ONDANSETRON HCL 4 MG/2ML IJ SOLN: 4.0000 mg | Freq: Once | INTRAMUSCULAR | Status: DC | PRN

## 2015-11-14 MED ORDER — LIDOCAINE HCL 2 % IJ SOLN
INTRAMUSCULAR | Status: DC | PRN
  Administered 2015-11-14: 10 mL

## 2015-11-14 MED ORDER — BSS IO SOLN
INTRAOCULAR | Status: DC | PRN
  Administered 2015-11-14: 500 mL via INTRAOCULAR

## 2015-11-14 MED ORDER — PROPOFOL 10 MG/ML IV BOLUS
INTRAVENOUS | Status: AC
  Filled 2015-11-14: qty 20

## 2015-11-14 MED ORDER — GATIFLOXACIN 0.5 % OP SOLN
1.0000 [drp] | OPHTHALMIC | Status: AC | PRN
  Administered 2015-11-14 (×3): 1 [drp] via OPHTHALMIC
  Filled 2015-11-14: qty 2.5

## 2015-11-14 MED ORDER — HYPROMELLOSE (GONIOSCOPIC) 2.5 % OP SOLN
OPHTHALMIC | Status: DC | PRN
  Administered 2015-11-14: 2 [drp] via OPHTHALMIC

## 2015-11-14 MED ORDER — EPINEPHRINE HCL 1 MG/ML IJ SOLN
INTRAMUSCULAR | Status: DC | PRN
  Administered 2015-11-14: .3 mL

## 2015-11-14 MED ORDER — POLYMYXIN B SULFATE 500000 UNITS IJ SOLR
INTRAMUSCULAR | Status: AC
  Filled 2015-11-14: qty 1

## 2015-11-14 MED ORDER — SODIUM CHLORIDE 0.9 % IV SOLN
INTRAVENOUS | Status: DC
  Administered 2015-11-14: 10 mL/h via INTRAVENOUS

## 2015-11-14 MED ORDER — EPINEPHRINE HCL 1 MG/ML IJ SOLN
INTRAMUSCULAR | Status: AC
  Filled 2015-11-14: qty 1

## 2015-11-14 MED ORDER — LIDOCAINE HCL (CARDIAC) 20 MG/ML IV SOLN
INTRAVENOUS | Status: AC
  Filled 2015-11-14: qty 5

## 2015-11-14 MED ORDER — BSS PLUS IO SOLN
INTRAOCULAR | Status: AC
  Filled 2015-11-14: qty 500

## 2015-11-14 MED ORDER — HYPROMELLOSE (GONIOSCOPIC) 2.5 % OP SOLN
OPHTHALMIC | Status: AC
  Filled 2015-11-14: qty 15

## 2015-11-14 MED ORDER — LIDOCAINE HCL 2 % IJ SOLN
INTRAMUSCULAR | Status: AC
  Filled 2015-11-14: qty 20

## 2015-11-14 MED ORDER — DEXAMETHASONE SODIUM PHOSPHATE 10 MG/ML IJ SOLN
INTRAMUSCULAR | Status: AC
  Filled 2015-11-14: qty 1

## 2015-11-14 MED ORDER — CYCLOPENTOLATE HCL 1 % OP SOLN
1.0000 [drp] | OPHTHALMIC | Status: AC | PRN
  Administered 2015-11-14 (×3): 1 [drp] via OPHTHALMIC
  Filled 2015-11-14: qty 2

## 2015-11-14 MED ORDER — SODIUM HYALURONATE 10 MG/ML IO SOLN
INTRAOCULAR | Status: AC
  Filled 2015-11-14: qty 0.85

## 2015-11-14 MED ORDER — SODIUM CHLORIDE 0.9 % IJ SOLN
INTRAMUSCULAR | Status: AC
  Filled 2015-11-14: qty 10

## 2015-11-14 MED ORDER — DEXAMETHASONE SODIUM PHOSPHATE 10 MG/ML IJ SOLN
INTRAMUSCULAR | Status: DC | PRN
  Administered 2015-11-14: 10 mg

## 2015-11-14 MED ORDER — TETRACAINE HCL 0.5 % OP SOLN
OPHTHALMIC | Status: AC
  Filled 2015-11-14: qty 2

## 2015-11-14 MED ORDER — DEXMEDETOMIDINE HCL 200 MCG/2ML IV SOLN
INTRAVENOUS | Status: DC | PRN
  Administered 2015-11-14: 60 ug via INTRAVENOUS

## 2015-11-14 SURGICAL SUPPLY — 59 items
APL SRG 3 HI ABS STRL LF PLS (MISCELLANEOUS)
APPLICATOR COTTON TIP 6IN STRL (MISCELLANEOUS) ×3 IMPLANT
APPLICATOR DR MATTHEWS STRL (MISCELLANEOUS) IMPLANT
BLADE MVR KNIFE 20G (BLADE) IMPLANT
CANNULA ANT CHAM MAIN (OPHTHALMIC RELATED) IMPLANT
CANNULA VLV SOFT TIP 25G (OPHTHALMIC) ×1 IMPLANT
CANNULA VLV SOFT TIP 25GA (OPHTHALMIC) ×3 IMPLANT
CORDS BIPOLAR (ELECTRODE) IMPLANT
COVER MAYO STAND STRL (DRAPES) IMPLANT
DRAPE INCISE 51X51 W/FILM STRL (DRAPES) IMPLANT
DRAPE OPHTHALMIC 77X100 STRL (CUSTOM PROCEDURE TRAY) ×3 IMPLANT
FILTER BLUE MILLIPORE (MISCELLANEOUS) IMPLANT
FORCEPS ECKARDT ILM 25G SERR (OPHTHALMIC RELATED) IMPLANT
FORCEPS GRIESHABER ILM 25G A (INSTRUMENTS) IMPLANT
FORCEPS HORIZONTAL 25G DISP (OPHTHALMIC RELATED) IMPLANT
GAS AUTO FILL CONSTEL (OPHTHALMIC)
GAS AUTO FILL CONSTELLATION (OPHTHALMIC) IMPLANT
GLOVE SS BIOGEL STRL SZ 8.5 (GLOVE) ×1 IMPLANT
GLOVE SUPERSENSE BIOGEL SZ 8.5 (GLOVE) ×2
GOWN STRL REUS W/ TWL LRG LVL3 (GOWN DISPOSABLE) ×1 IMPLANT
GOWN STRL REUS W/ TWL XL LVL3 (GOWN DISPOSABLE) ×1 IMPLANT
GOWN STRL REUS W/TWL LRG LVL3 (GOWN DISPOSABLE) ×3
GOWN STRL REUS W/TWL XL LVL3 (GOWN DISPOSABLE) ×3
KIT BASIN OR (CUSTOM PROCEDURE TRAY) ×3 IMPLANT
KNIFE CRESCENT 2.5 55 ANG (BLADE) IMPLANT
LENS BIOM SUPER VIEW SET DISP (OPHTHALMIC RELATED) ×3 IMPLANT
MICROPICK 25G (MISCELLANEOUS)
NDL 18GX1X1/2 (RX/OR ONLY) (NEEDLE) IMPLANT
NDL 25GX 5/8IN NON SAFETY (NEEDLE) IMPLANT
NDL FILTER BLUNT 18X1 1/2 (NEEDLE) IMPLANT
NDL HYPO 25GX1X1/2 BEV (NEEDLE) IMPLANT
NEEDLE 18GX1X1/2 (RX/OR ONLY) (NEEDLE) IMPLANT
NEEDLE 25GX 5/8IN NON SAFETY (NEEDLE) IMPLANT
NEEDLE FILTER BLUNT 18X 1/2SAF (NEEDLE)
NEEDLE FILTER BLUNT 18X1 1/2 (NEEDLE) IMPLANT
NEEDLE HYPO 25GX1X1/2 BEV (NEEDLE) IMPLANT
NS IRRIG 1000ML POUR BTL (IV SOLUTION) ×3 IMPLANT
PACK FRAGMATOME (OPHTHALMIC) IMPLANT
PACK VITRECTOMY CUSTOM (CUSTOM PROCEDURE TRAY) ×3 IMPLANT
PAD ARMBOARD 7.5X6 YLW CONV (MISCELLANEOUS) ×6 IMPLANT
PAK PIK VITRECTOMY CVS 25GA (OPHTHALMIC) ×3 IMPLANT
PENCIL BIPOLAR 25GA STR DISP (OPHTHALMIC RELATED) IMPLANT
PICK MICROPICK 25G (MISCELLANEOUS) IMPLANT
PROBE LASER ILLUM FLEX CVD 25G (OPHTHALMIC) IMPLANT
ROLLS DENTAL (MISCELLANEOUS) IMPLANT
SCRAPER DIAMOND 25GA (OPHTHALMIC RELATED) IMPLANT
SET INJECTOR OIL FLUID CONSTEL (OPHTHALMIC) IMPLANT
STOCKINETTE IMPERVIOUS 9X36 MD (GAUZE/BANDAGES/DRESSINGS) ×6 IMPLANT
STOPCOCK 4 WAY LG BORE MALE ST (IV SETS) IMPLANT
SUT ETHILON 10 0 CS140 6 (SUTURE) IMPLANT
SUT ETHILON 8 0 BV130 4 (SUTURE) IMPLANT
SUT MERSILENE 5 0 RD 1 DA (SUTURE) IMPLANT
SUT PROLENE 10 0 CIF 4 DA (SUTURE) IMPLANT
SUT VICRYL 7 0 TG140 8 (SUTURE) IMPLANT
SYR 30ML SLIP (SYRINGE) IMPLANT
SYR 5ML LL (SYRINGE) IMPLANT
SYR TB 1ML LUER SLIP (SYRINGE) IMPLANT
WATER STERILE IRR 1000ML POUR (IV SOLUTION) ×3 IMPLANT
WIPE INSTRUMENT VISIWIPE 73X73 (MISCELLANEOUS) IMPLANT

## 2015-11-14 NOTE — Op Note (Signed)
Preoperative diagnosis: #1 vision loss right eye #2 dense vitreous debris right eye #3 vitreous floater right eye #4 age-related macular degeneration right eye  postoperative diagnosis: Number 1-4 same #5 retinal hole 4:00 position inferonasal #6 posterior capsule opacification  Procedure #1 posterior vitrectomy with focal endolaser photo coagulation right eye #2 surgical posterior capsulotomy right eye  Surgeon: Hurman Horn M.D.  Anesthesia: Local with retrobulbar, monitored anesthesia control  Indication for procedure: Patient has profound visual loss in the right eye the basis of dense vitreous debris nonclearing requiring surgical intervention to determine etiology but also to deliver visual acuity improvement. B-scan ultrasonography preoperatively disclose no evidence of hole or tear or detachment. he understands risk of anesthesia including the rare occurrence of death but also to the eye from the underlying condition including but not limited to hemorrhage, infection, scarring, need for surgery, no change of vision, loss of vision and progression of disease despite intervention. Appropriate signed consent obtained patient taken to the operating room.  Description of procedure in detail: In the operating room appropriate monitoring was followed by mild sedation. Appropriate site selection was confirmed of the operative right eye with surgical staff and the surgeon thereafter no mild sedation 2% Xylocaine 5 mL injected retrobulbar R atraumatically. The] region was sterilely prepped and draped usual fashion. A mostly noted for additional 5 mL of Xylocaine 1% injected laterally in fashion of modified Kirk Ruths.  Appropriate sterile draping prep and then carried out the right eye. This time lid speculum applied. 25-gauge trocar placed in the inferotemporal quadrant placement verified visually infusion turned on. Superior trochars applied. Cortectomy was begun using the biome BIOM attachment to the  microscope. His opacification and vitreous debris noted centrally in the visual axis medially posterior to the lens but also in the mid vitreous and. Peripheral vitreous was then circumcised through 67. Posterior hyaloid had previously spontaneously detach. Cloudy capsule was also identified surgical posterior capsulotomy was created. Retinal hole was found to 4:00 position inferonasal limbus history with focal is photocoagulation. Peripheral nonperfusion other outer retina was noted with peripheral Mac choroidal degeneration place photo regulation placed in mid periphery through 61. No comp cases occurred. Instruments removed from the eye. Trochars removed from the eye infusion removed time Decadron applied. Sterile patch traction applied. Patient tolerated procedure competition taken the PACU in good stable condition. Dr. Zadie Rhine Report

## 2015-11-14 NOTE — Transfer of Care (Signed)
Immediate Anesthesia Transfer of Care Note  Patient: [name]  Procedure(s) Performed: Procedure(s): PARS PLANA VITRECTOMY WITH 25 GAUGE, WITH ENDOLASER (Right)  Patient Location: PACU  Anesthesia Type:MAC  Level of Consciousness: awake and alert   Airway & Oxygen Therapy: Patient Spontanous Breathing  Post-op Assessment: Report given to RN  Post vital signs: Reviewed and stable  Last Vitals:  Filed Vitals:   11/14/15 1156  BP: 133/49  Pulse: 85  Temp: 36.3 C  Resp: 16    Complications: No apparent anesthesia complications

## 2015-11-14 NOTE — Anesthesia Procedure Notes (Signed)
Procedure Name: MAC Date/Time: 11/14/2015 2:41 PM Performed by: Barrington Ellison Pre-anesthesia Checklist: Patient identified, Emergency Drugs available, Suction available, Patient being monitored and Timeout performed Patient Re-evaluated:Patient Re-evaluated prior to inductionOxygen Delivery Method: Nasal cannula

## 2015-11-14 NOTE — H&P (Signed)
Patient has a profound vision loss in the right eye onset for some weeks which is been dense nonclearing with the vitreous debris throughout the posterior pole preventing ability to adequately assess the peripheral retina. B-scan ultrasonography of the right eye did fail discloses a retinal holes tears or retinal detachment. Unless patient is activities of daily living hampered by his declining vision. Vision acuity right eye uncorrected 20/200 left eye is 2080 pinhole 20/50 extent examination notable for normal anterior 7 examination each eye. 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 Left eye is totally normal. 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 This is attempt therefore to proceed with surgical intervention. Impression 1 dense vitreous debris of the right eye #2 is vision loss right eye  Plan is a posterior vitrectomy with clearance of the vitreous past patient right eye under local anesthesia with monitored anesthesia control. Patient was seen Dr. performed as an outpatient

## 2015-11-14 NOTE — Brief Op Note (Signed)
Preoperative diagnosis: #1 vision loss right eye #2 dense vitreous debris right eye #3 vitreous floater right eye #4 age-related macular degeneration right eye  postoperative diagnosis: Number 1-4 same #5 retinal hole 4:00 position inferonasal #6 posterior capsule opacification  Procedure: #1 posterior vitrectomy with focal endolaser photo regulation for retinal hole inferonasally right eye due to surgical posterior capsulotomy right eye

## 2015-11-14 NOTE — Anesthesia Postprocedure Evaluation (Signed)
  Anesthesia Post-op Note  Patient: [name]  Procedure(s) Performed: Procedure(s): PARS PLANA VITRECTOMY WITH 25 GAUGE, WITH ENDOLASER (Right)  Patient Location: PACU  Anesthesia Type:MAC  Level of Consciousness: awake, alert  and oriented  Airway and Oxygen Therapy: Patient Spontanous Breathing  Post-op Pain: none  Post-op Assessment: Post-op Vital signs reviewed              Post-op Vital Signs: Reviewed  Last Vitals:  Filed Vitals:   11/14/15 1600  BP: 137/58  Pulse: 47  Temp: 36.5 C  Resp: 19    Complications: No apparent anesthesia complications

## 2015-11-14 NOTE — Progress Notes (Signed)
Call to Dr. Linna Caprice, informed that pt. Is ready to be seen, that he has a St. Jude device. Pt. Unsure about interrogation- it was placed through [place] system ( 11/2014)  & [facility] at which time pt. Was set up for a device clinic visit , but it is not scheduled until next week.

## 2015-11-14 NOTE — Anesthesia Preprocedure Evaluation (Addendum)
Anesthesia Evaluation  Patient identified by MRN, date of birth, ID band Patient awake    Reviewed: Allergy & Precautions, NPO status , Patient's Chart, lab work & pertinent test results  Airway Mallampati: II  TM Distance: >3 FB Neck ROM: Full    Dental  (+) Edentulous Upper, Edentulous Lower   Pulmonary COPD,  COPD inhaler, former smoker,     + decreased breath sounds      Cardiovascular hypertension, + CAD and + Peripheral Vascular Disease  + dysrhythmias Atrial Fibrillation + pacemaker  Rhythm:Irregular Rate:Normal     Neuro/Psych negative neurological ROS  negative psych ROS   GI/Hepatic negative GI ROS, Neg liver ROS,   Endo/Other  diabetes, Type 2, Oral Hypoglycemic AgentsHypothyroidism   Renal/GU negative Renal ROS     Musculoskeletal negative musculoskeletal ROS (+)   Abdominal   Peds  Hematology   Anesthesia Other Findings   Reproductive/Obstetrics negative OB ROS                           Lab Results  Component Value Date   WBC 4.7 11/14/2015   HGB 14.2 11/14/2015   HCT 42.3 11/14/2015   MCV 95.1 11/14/2015   PLT 77* 11/14/2015   Lab Results  Component Value Date   CREATININE 0.77 11/14/2015   BUN 27* 11/14/2015   NA 138 11/14/2015   K 4.3 11/14/2015   CL 98* 11/14/2015   CO2 32 11/14/2015   Lab Results  Component Value Date   INR 1.29 07/11/2013   INR 1.15 11/03/2012   INR 0.98 09/25/2012     Anesthesia Physical Anesthesia Plan  ASA: III  Anesthesia Plan: MAC   Post-op Pain Management:    Induction: Intravenous  Airway Management Planned: Natural Airway and Nasal Cannula  Additional Equipment:   Intra-op Plan:   Post-operative Plan:   Informed Consent: I have reviewed the patients History and Physical, chart, labs and discussed the procedure including the risks, benefits and alternatives for the proposed anesthesia with the patient or authorized  representative who has indicated his/her understanding and acceptance.     Plan Discussed with: CRNA and Anesthesiologist  Anesthesia Plan Comments: (81 year old male needs vitrectomy for vitreous  Chronic atrial fib with pacer inserted 12/15 H/O pneumothorax 10/16 not treated with chest tube Type 2 DM glucose 144 Htn COPD H/O liver Ca S/P partial hepatectomy Chronic thrombocytopenia Chronic Type B aortic dissection   Plan MAC vs GA  Roberts Gaudy     )        Anesthesia Quick Evaluation

## 2015-11-15 ENCOUNTER — Encounter (HOSPITAL_COMMUNITY): Payer: Self-pay | Admitting: Ophthalmology

## 2015-11-16 ENCOUNTER — Encounter: Payer: Self-pay | Admitting: Cardiovascular Disease

## 2015-11-16 ENCOUNTER — Ambulatory Visit (INDEPENDENT_AMBULATORY_CARE_PROVIDER_SITE_OTHER): Payer: Medicare Other | Admitting: Cardiovascular Disease

## 2015-11-16 VITALS — BP 98/46 | HR 45 | Resp 20 | Ht 69.0 in | Wt 122.4 lb

## 2015-11-16 DIAGNOSIS — I482 Chronic atrial fibrillation, unspecified: Secondary | ICD-10-CM

## 2015-11-16 DIAGNOSIS — I441 Atrioventricular block, second degree: Secondary | ICD-10-CM

## 2015-11-16 DIAGNOSIS — Z950 Presence of cardiac pacemaker: Secondary | ICD-10-CM | POA: Diagnosis not present

## 2015-11-16 NOTE — Patient Instructions (Signed)
Remote monitoring is used to monitor your Pacemaker from home. This monitoring reduces the number of office visits required to check your device to one time per year. It allows [place] to monitor the functioning of your device to ensure it is working properly. You are scheduled for a device check from home on February 17, 2016. You may send your transmission at any time that day. If you have a wireless device, the transmission will be sent automatically. After your physician reviews your transmission, you will receive a postcard with your next transmission date.  Dr. Sallyanne Kuster recommends that you schedule a follow-up appointment in: [place] ([place]).

## 2015-11-16 NOTE — Progress Notes (Signed)
Patient ID: [name], male   DOB: 08-04-1931, 81 y.o.   MRN: [id number]     Cardiology Office Note   Date:  11/16/2015   ID:  [name], DOB Jun 07, 1931, MRN [id number]  PCP:  Conni Slipper, NP  Cardiologist:    Quay Burow, M.D.;  Sanda Klein, MD   Chief Complaint  Patient presents with  . Follow-up    pacer check, new to practice      History of Present Illness: [name] is a 81 y.o. male who presents  To establish pacemaker follow-up. Previous care was at [facility]. He saw Dr. Oval Linsey about a week ago.  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 He had left internal carotid artery stenting in 2002. 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 He has problems with chronic aspiration and is receiving nutrition via a gastrostomy tube.  He has permanent atrial fibrillation with slow ventricular response he is felt to be a poor candidate for anticoagulation. He has severe chronic lung disease. He had resection of the right lobe of his liver for cancer in 2012. He has COPD Gold class C. In October 2016, he was admitted to the hospital with a small left pneumothorax, managed medically.    secondary to marked bradycardia and pauses exceeding 3 seconds, he had a single-chamber St. Jude pacemaker implanted at [facility] in December 2015 by Dr. Janeece Riggers.  It seems that the device has not been interrogated since.  Device check today shows normal function. He has a Immunologist. He has 60% ventricular pacing with a fair heart rate histogram. Estimated generator longevity is 9.6-11.2 years. The lower rate limit is programmed at 50 bpm. Auto capture has lead to unnecessary increase in pacing outputs , probably the irregular rhythm is confusing the algorithm. His ventricular threshold is excellent at 0.75 V at 0.4 ms pulse width. He was change to fixed pacing outputs. The device has not  really shown any high ventricular rates but the detection was set at 75 bpm. This was decreased to 150 bpm today. Ventricular sensitivity was also changed up towards to 2 mV. His R waves are greater than 12 mV.  Past Medical History  Diagnosis Date  . Hyperlipidemia   . Hypertension   . [place] (hepatocellular carcinoma) ([place])     s/p right lobe resection 2012  . Peripheral vascular disease ([place])     LICA stent [id number]  . Pneumonia 11/03/2012    aspiration pneumonias  . COPD (chronic obstructive pulmonary disease) (HCC)     Gold C  . Thrombocytopenia ([place])   . Severe malnutrition ([place])   . Dysphagia     s/p PEG placement  . Paroxysmal atrial flutter (HCC)     and fibrillation with known bradycardia, Chads2vasc of 4, not on A/C due to thrombocytopenia and functional status  . Dissecting aortic aneurysm, abdominal ([place]) 11/13/2011    Overview:  Severe aortoiliac stenosis with possible a chronic dissection versus  ulcerated plaque/thrombus extending above and below the aortic  bifurcation; without aneurysmal dilation  as per CT abdomen noted 09/25/2011   . DM2 (diabetes mellitus, type 2) ([place])     takes Tradjenta daily  . Hypothyroidism     takes Synthroid daily   . Coronary artery disease   . Shortness of breath dyspnea     with exertion   . Back pain   . Pneumothorax  10/2015  . Presence of permanent cardiac pacemaker   . Pacemaker 11/11/2015    St. Jude single chamber pacemaker implanted at [facility] 11/2014.    Past Surgical History  Procedure Laterality Date  . Carotid stent  2002  . Liver canc    . Posterior cervical fusion/foraminotomy  09/25/2012    Procedure: POSTERIOR CERVICAL FUSION/FORAMINOTOMY LEVEL 2;  Surgeon: Eustace Moore, MD;  Location: [place] NEURO ORS;  Service: Neurosurgery;  Laterality: N/A;  Posterior Cervical three-four laminectomy, posterior cervical three-four, four-five fusion  . Esophagogastroduodenoscopy  10/01/2012    Procedure: ESOPHAGOGASTRODUODENOSCOPY (EGD);   Surgeon: Gwenyth Ober, MD;  Location: [place];  Service: General;  Laterality: N/A;  wyatt/leone  . Peg placement  10/01/2012    Procedure: PERCUTANEOUS ENDOSCOPIC GASTROSTOMY (PEG) PLACEMENT;  Surgeon: Gwenyth Ober, MD;  Location: [place];  Service: General;  Laterality: N/A;  . Esophagogastroduodenoscopy  10/31/2012    Procedure: ESOPHAGOGASTRODUODENOSCOPY (EGD);  Surgeon: Beryle Beams, MD;  Location[facility];  Service: Endoscopy;  Laterality: N/A;  . Carotid angiogram  2012  . Doppler echocardiography  2011  . Peg tube placement  10/12/2015       replacment tube  . Cataract surgery       bilateral  . Colonoscopy    . Pacemaker inserted    . Pars plana vitrectomy Right 11/14/2015    Procedure: PARS PLANA VITRECTOMY WITH 25 GAUGE, WITH ENDOLASER;  Surgeon: Hurman Horn, MD;  Location: [place];  Service: Ophthalmology;  Laterality: Right;     Current Outpatient Prescriptions  Medication Sig Dispense Refill  . albuterol (PROVENTIL) (2.5 MG/3ML) 0.083% nebulizer solution Take 3 mLs (2.5 mg total) by nebulization every 6 (six) hours as needed for wheezing or shortness of breath. 75 mL 12  . aspirin EC 81 MG tablet Place 81 mg into feeding tube daily.     . budesonide (PULMICORT) 0.25 MG/2ML nebulizer solution Take 0.25 mg by nebulization every 6 (six) hours.     . cyproheptadine (PERIACTIN) 4 MG tablet Place 4 mg into feeding tube daily.     . Difluprednate (DUREZOL) 0.05 % EMUL Place 1 drop into the right eye See admin instructions. Instil 1 drop into right eye twice daily on 11/12 & 11/13 (Sat & Sun before procedure) and 11/15 & 11/16 (Tues & Wed after procedure)    . ipratropium (ATROVENT HFA) 17 MCG/ACT inhaler Inhale 2 puffs into the lungs every 6 (six) hours as needed for wheezing.    Marland Kitchen levothyroxine (SYNTHROID, LEVOTHROID) 100 MCG tablet Place 100 mcg into feeding tube daily.     Marland Kitchen linagliptin (TRADJENTA) 5 MG TABS tablet Place 5 mg into feeding tube daily.     .  mirtazapine (REMERON) 30 MG tablet Take 0.5 tablets (15 mg total) by mouth at bedtime. (Patient taking differently: Place 15 mg into feeding tube at bedtime. ) 30 tablet 2  . mometasone-formoterol (DULERA) 100-5 MCG/ACT AERO Inhale 2 puffs into the lungs 2 (two) times daily. 1 Inhaler 2  . Nutritional Supplements (FEEDING SUPPLEMENT, OSMOLITE 1.5 CAL,) LIQD Place 1,000 mLs into feeding tube continuous. (Patient taking differently: Place 1,896 mLs into feeding tube daily. 8 cans) 1000 mL 0  . ofloxacin (OCUFLOX) 0.3 % ophthalmic solution Place 1 drop into the right eye 4 (four) times daily.    Marland Kitchen oxyCODONE (ROXICODONE) 15 MG immediate release tablet Place 7.5-15 mg into feeding tube See admin instructions. Take 1/2 - 1 tablet by feeding tube twice daily (depending  on pain level), may take an additional 1/2 -1 tablet at noon if needed for pain  0  . polyethylene glycol (MIRALAX / GLYCOLAX) packet Place 8.5 g into feeding tube 2 (two) times daily.     Marland Kitchen tiotropium (SPIRIVA) 18 MCG inhalation capsule Place 18 mcg into inhaler and inhale 2 (two) times daily.    . Water For Irrigation, Sterile (FREE WATER) SOLN Place 200 mLs into feeding tube every 8 (eight) hours. (Patient taking differently: Place 500 mLs into feeding tube every 8 (eight) hours. )     No current facility-administered medications for this visit.    Allergies:   Review of patient's allergies indicates no known allergies.    Social History:  The patient  reports that he has quit smoking. His smoking use included Cigarettes. He smoked 0.00 packs per day. He has never used smokeless tobacco. He reports that he does not drink alcohol or use illicit drugs.   Family History:  The patient's family history includes Cancer in his father; Liver disease in his brother. There is no history of CAD.    ROS:  Please see the history of present illness.    Otherwise, review of systems positive for  Inability to gain weight, choking when he tries to  drink water. He does not eat, uses the feeding tube.   All other systems are reviewed and negative.    PHYSICAL EXAM: VS:  BP 98/46 mmHg  Pulse 45  Resp 20  Ht 5\' 9"  (1.753 m)  Wt 122 lb 6 oz (55.509 kg)  BMI 18.06 kg/m2 , BMI Body mass index is 18.06 kg/(m^2).  actual heart rate is 55 by pacemaker interrogation, peripheral pulse palpated at 45 General: Alert, oriented x3, no distress,  Cachectic.  Scleral hemorrhage and small periorbital ecchymosis following recent eye surgery Head: no evidence of trauma, PERRL, EOMI, no exophtalmos or lid lag, no myxedema, no xanthelasma; normal ears, nose and oropharynx Neck: normal jugular venous pulsations and no hepatojugular reflux; brisk carotid pulses with delay and with bilateral carotid bruits Chest: clear to auscultation, no signs of consolidation by percussion or palpation, normal fremitus, symmetrical and full respiratory excursions Cardiovascular: normal position and quality of the apical impulse, regular rhythm, normal first and paradoxically split second heart sounds, no murmurs, rubs or gallops Abdomen: no tenderness or distention, no masses by palpation, no abnormal pulsatility or arterial bruits, normal bowel sounds, no hepatosplenomegaly Extremities: no clubbing, cyanosis or edema; 2+ radial, ulnar and brachial pulses bilaterally; 2+ right femoral, posterior tibial and dorsalis pedis pulses; 2+ left femoral, posterior tibial and dorsalis pedis pulses; no subclavian or femoral bruits Neurological: grossly nonfocal Psych: euthymic mood, full affect   EKG:  EKG is not ordered today.   Recent Labs: 04/01/2015: ALT 14 11/14/2015: BUN 27*; Creatinine, Ser 0.77; Hemoglobin 14.2; Platelets 77*; Potassium 4.3; Sodium 138    Lipid Panel    Component Value Date/Time   CHOL 72 11/03/2012 1438   TRIG 80 11/03/2012 1438   HDL 16* 11/03/2012 1438   CHOLHDL 4.5 11/03/2012 1438   VLDL 16 11/03/2012 1438   LDLCALC 40 11/03/2012 1438       Wt Readings from Last 3 Encounters:  11/16/15 122 lb 6 oz (55.509 kg)  11/14/15 126 lb 4.8 oz (57.289 kg)  11/11/15 126 lb 4.8 oz (57.289 kg)     ASSESSMENT AND PLAN:  Normal single chamber pacemaker function. Minor adjustments made to pacing output/sensitivity/monitor zone. Lower rate limit left of 50 bpm.  Will enroll in remote follow-up using [place] and this was discussed with him in detail today. Automatic downloads every 3 months and clinic follow-up in 12 months.  His embolic/stroke risk is high , but so his bleeding risk. He is extremely thin and appears quite frail. He recently had eye surgery and has a periorbital ecchymosis. At this point , anticoagulation appears to risky.  Current medicines are reviewed at length with the patient today.  The patient does not have concerns regarding medicines.  The following changes have been made:  no change  Labs/ tests ordered today include:  No orders of the defined types were placed in this encounter.    Patient Instructions  Remote monitoring is used to monitor your Pacemaker from home. This monitoring reduces the number of office visits required to check your device to one time per year. It allows [place] to monitor the functioning of your device to ensure it is working properly. You are scheduled for a device check from home on February 17, 2016. You may send your transmission at any time that day. If you have a wireless device, the transmission will be sent automatically. After your physician reviews your transmission, you will receive a postcard with your next transmission date.  Dr. Sallyanne Kuster recommends that you schedule a follow-up appointment in: [place] ([place]).     Mikael Spray, MD  11/16/2015 1:45 PM    Sanda Klein, MD, [facility] [phone] office [phone] pager

## 2015-12-30 ENCOUNTER — Inpatient Hospital Stay (HOSPITAL_COMMUNITY): Admission: RE | Admit: 2015-12-30 | Payer: Medicare Other | Source: Ambulatory Visit

## 2016-01-05 ENCOUNTER — Ambulatory Visit (HOSPITAL_COMMUNITY)
Admission: RE | Admit: 2016-01-05 | Discharge: 2016-01-05 | Disposition: A | Discharge status: Home / Self Care | Payer: Medicare Other | Source: Ambulatory Visit | Attending: Cardiology | Admitting: Cardiology

## 2016-01-05 DIAGNOSIS — I6523 Occlusion and stenosis of bilateral carotid arteries: Secondary | ICD-10-CM | POA: Insufficient documentation

## 2016-01-05 DIAGNOSIS — I6529 Occlusion and stenosis of unspecified carotid artery: Secondary | ICD-10-CM

## 2016-01-05 DIAGNOSIS — E119 Type 2 diabetes mellitus without complications: Secondary | ICD-10-CM | POA: Diagnosis not present

## 2016-01-05 DIAGNOSIS — E785 Hyperlipidemia, unspecified: Secondary | ICD-10-CM | POA: Insufficient documentation

## 2016-01-05 DIAGNOSIS — I10 Essential (primary) hypertension: Secondary | ICD-10-CM | POA: Diagnosis not present

## 2016-01-11 ENCOUNTER — Telehealth: Payer: Self-pay

## 2016-01-11 DIAGNOSIS — I158 Other secondary hypertension: Secondary | ICD-10-CM

## 2016-01-11 NOTE — Telephone Encounter (Signed)
-----   Message from Jonathan J Berry, MD sent at 01/06/2016  1:17 PM EST ----- No change from prior study. Repeat in 12 months. 

## 2016-01-17 ENCOUNTER — Ambulatory Visit (HOSPITAL_COMMUNITY)
Admission: RE | Admit: 2016-01-17 | Discharge: 2016-01-17 | Disposition: A | Discharge status: Home / Self Care | Payer: Medicare Other | Source: Ambulatory Visit | Attending: Interventional Radiology | Admitting: Interventional Radiology

## 2016-01-17 ENCOUNTER — Other Ambulatory Visit (HOSPITAL_COMMUNITY): Payer: Self-pay | Admitting: Interventional Radiology

## 2016-01-17 DIAGNOSIS — R633 Feeding difficulties, unspecified: Secondary | ICD-10-CM

## 2016-01-17 DIAGNOSIS — Z434 Encounter for attention to other artificial openings of digestive tract: Secondary | ICD-10-CM | POA: Diagnosis present

## 2016-01-17 MED ORDER — IOHEXOL 300 MG/ML  SOLN
50.0000 mL | Freq: Once | INTRAMUSCULAR | Status: AC | PRN
  Administered 2016-01-17: 15 mL

## 2016-02-15 ENCOUNTER — Encounter: Payer: Self-pay | Admitting: Cardiovascular Disease

## 2016-02-15 ENCOUNTER — Other Ambulatory Visit (HOSPITAL_COMMUNITY): Payer: Self-pay | Admitting: Interventional Radiology

## 2016-02-15 ENCOUNTER — Ambulatory Visit (INDEPENDENT_AMBULATORY_CARE_PROVIDER_SITE_OTHER): Payer: Medicare Other | Admitting: Cardiovascular Disease

## 2016-02-15 VITALS — BP 86/54 | HR 46 | Ht 69.0 in | Wt 123.0 lb

## 2016-02-15 DIAGNOSIS — I739 Peripheral vascular disease, unspecified: Secondary | ICD-10-CM | POA: Diagnosis not present

## 2016-02-15 DIAGNOSIS — E785 Hyperlipidemia, unspecified: Secondary | ICD-10-CM

## 2016-02-15 DIAGNOSIS — Z950 Presence of cardiac pacemaker: Secondary | ICD-10-CM

## 2016-02-15 DIAGNOSIS — R1310 Dysphagia, unspecified: Secondary | ICD-10-CM

## 2016-02-15 DIAGNOSIS — I10 Essential (primary) hypertension: Secondary | ICD-10-CM

## 2016-02-15 NOTE — Assessment & Plan Note (Signed)
History of hypertension blood pressure measured to 86/54. He currently is not on antihypertensive medications.

## 2016-02-15 NOTE — Patient Instructions (Signed)

## 2016-02-15 NOTE — Progress Notes (Signed)
02/15/2016 [name]   02/21/1931  [id number]  Primary Physician [place], NP Primary Cardiologist: Lorretta Harp MD Renae Gloss   HPI:  The patient is a 82 year old, thin and chronically ill-appearing, widowed Caucasian male, father of 2 living children who I last saw in the office 2 years ago . He has a history of PSVT status post RF ablation by Dr. 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 He has had no recurrent episodes. He stopped smoking approximately 2 years ago. He drinks approximately 1 to 2 beers a night. He has hypertension, hyperlipidemia, and noninsulin-requiring diabetes. He does have a known occluded right internal carotid artery status post stenting of his left internal carotid artery by Dr. Celedonio Miyamoto in [place] in 2002. We have been following his duplex ultrasounds which have shown mild in-stent restenosis. He had a negative functional study April 30, 2011, prior to liver surgery done at [facility] because of cancer, which was successful. He does have COPD having stopped smoking 5 years ago. He had a permanent transvenous pacemaker placed by Dr. Meyer Cory at [facility] December 2015. His chronic A. Fib not thought to be a Coumadin candidate. Dr. Sallyanne Kuster recently saw him in August 2016 for pacemaker follow-up. He is chronically short of breath but denies chest pain.  Current Outpatient Prescriptions  Medication Sig Dispense Refill  . albuterol (PROVENTIL) (2.5 MG/3ML) 0.083% nebulizer solution Take 3 mLs (2.5 mg total) by nebulization every 6 (six) hours as needed for wheezing or shortness of breath. 75 mL 12  . aspirin EC 81 MG tablet Place 81 mg into feeding tube daily.     . budesonide (PULMICORT) 0.25 MG/2ML nebulizer solution Take 0.25 mg by nebulization every 6 (six) hours.     . cyproheptadine (PERIACTIN) 4 MG tablet Place 4 mg into feeding tube daily.     . Difluprednate (DUREZOL)  0.05 % EMUL Place 1 drop into the right eye See admin instructions. Instil 1 drop into right eye twice daily on 11/12 & 11/13 (Sat & Sun before procedure) and 11/15 & 11/16 (Tues & Wed after procedure)    . ipratropium (ATROVENT HFA) 17 MCG/ACT inhaler Inhale 2 puffs into the lungs every 6 (six) hours as needed for wheezing.    Marland Kitchen levothyroxine (SYNTHROID, LEVOTHROID) 100 MCG tablet Place 100 mcg into feeding tube daily.     Marland Kitchen linagliptin (TRADJENTA) 5 MG TABS tablet Place 5 mg into feeding tube daily.     . mirtazapine (REMERON) 30 MG tablet Take 0.5 tablets (15 mg total) by mouth at bedtime. (Patient taking differently: Place 15 mg into feeding tube at bedtime. ) 30 tablet 2  . mometasone-formoterol (DULERA) 100-5 MCG/ACT AERO Inhale 2 puffs into the lungs 2 (two) times daily. 1 Inhaler 2  . Nutritional Supplements (FEEDING SUPPLEMENT, OSMOLITE 1.5 CAL,) LIQD Place 1,000 mLs into feeding tube continuous. (Patient taking differently: Place 1,896 mLs into feeding tube daily. 8 cans) 1000 mL 0  . ofloxacin (OCUFLOX) 0.3 % ophthalmic solution Place 1 drop into the right eye 4 (four) times daily.    Marland Kitchen oxyCODONE (ROXICODONE) 15 MG immediate release tablet Place 7.5-15 mg into feeding tube See admin instructions. Take 1/2 - 1 tablet by feeding tube twice daily (depending on pain level), may take an additional 1/2 -1 tablet at noon if needed for pain  0  . polyethylene glycol (MIRALAX / GLYCOLAX) packet Place 8.5 g into feeding  tube 2 (two) times daily.     Marland Kitchen tiotropium (SPIRIVA) 18 MCG inhalation capsule Place 18 mcg into inhaler and inhale 2 (two) times daily.    . Water For Irrigation, Sterile (FREE WATER) SOLN Place 200 mLs into feeding tube every 8 (eight) hours. (Patient taking differently: Place 500 mLs into feeding tube every 8 (eight) hours. )     No current facility-administered medications for this visit.    No Known Allergies  Social History   Social History  . Marital Status: Widowed     Spouse Name: N/A  . Number of Children: 2  . Years of Education: N/A   Occupational History  .     Social History Main Topics  . Smoking status: Former Smoker -- 0.00 packs/day    Types: Cigarettes  . Smokeless tobacco: Never Used     Comment: quit smoking about 5 yrs ago  . Alcohol Use: No     Comment: no alcohol in 2 yrs ago  . Drug Use: No  . Sexual Activity: Not on file   Other Topics Concern  . Not on file   Social History Narrative   Lives with male friend who is also a caretaker.       Review of Systems: General: negative for chills, fever, night sweats or weight changes.  Cardiovascular: negative for chest pain, dyspnea on exertion, edema, orthopnea, palpitations, paroxysmal nocturnal dyspnea or shortness of breath Dermatological: negative for rash Respiratory: negative for cough or wheezing Urologic: negative for hematuria Abdominal: negative for nausea, vomiting, diarrhea, bright red blood per rectum, melena, or hematemesis Neurologic: negative for visual changes, syncope, or dizziness All other systems reviewed and are otherwise negative except as noted above.    Blood pressure 86/54, pulse 46, height 5\' 9"  (1.753 m), weight 123 lb (55.792 kg).  General appearance: alert and no distress Neck: no adenopathy, no carotid bruit, no JVD, supple, symmetrical, trachea midline and thyroid not enlarged, symmetric, no tenderness/mass/nodules Lungs: clear to auscultation bilaterally Heart: regular rate and rhythm, S1, S2 normal, no murmur, click, rub or gallop Extremities: extremities normal, atraumatic, no cyanosis or edema  EKG not performed today  ASSESSMENT AND PLAN:   PVD (peripheral vascular disease), Hx of occl RICA, LICA stent [id number] History of occluded right internal carotid artery status post left internal carotid artery stenting in 2002 by Dr. Trixie Dredge in [facility]. We continue to follow this by duplex ultrasound most recently performed in  January of this year revealing his stent to be widely patent.  Hypertension History of hypertension blood pressure measured to 86/54. He currently is not on antihypertensive medications.  Hyperlipidemia History of hyperlipidemia not on statin therapy followed by his PCP  Pacemaker History of permanent past venous pacemaker implanted by Dr. Dr. Meyer Cory at [facility] December 2015. He has chronic A. Fib not thought to be a Coumadin candidate. His pacemaker is followed by Dr. Sallyanne Kuster.      Lorretta Harp MD FACP,FACC,FAHA, [facility] 02/15/2016 12:09 PM

## 2016-02-15 NOTE — Assessment & Plan Note (Signed)
History of permanent past venous pacemaker implanted by Dr. Dr. Meyer Cory at [facility] December 2015. He has chronic A. Fib not thought to be a Coumadin candidate. His pacemaker is followed by Dr. Sallyanne Kuster.

## 2016-02-15 NOTE — Assessment & Plan Note (Signed)
History of occluded right internal carotid artery status post left internal carotid artery stenting in 2002 by Dr. Trixie Dredge in [facility]. We continue to follow this by duplex ultrasound most recently performed in January of this year revealing his stent to be widely patent.

## 2016-02-15 NOTE — Assessment & Plan Note (Signed)
History of hyperlipidemia not on statin therapy followed by his PCP 

## 2016-02-16 ENCOUNTER — Ambulatory Visit (HOSPITAL_COMMUNITY)
Admission: RE | Admit: 2016-02-16 | Discharge: 2016-02-16 | Disposition: A | Discharge status: Home / Self Care | Payer: Medicare Other | Source: Ambulatory Visit | Attending: Interventional Radiology | Admitting: Interventional Radiology

## 2016-02-16 DIAGNOSIS — K9419 Other complications of enterostomy: Secondary | ICD-10-CM | POA: Insufficient documentation

## 2016-02-16 DIAGNOSIS — Y733 Surgical instruments, materials and gastroenterology and urology devices (including sutures) associated with adverse incidents: Secondary | ICD-10-CM | POA: Diagnosis not present

## 2016-02-16 DIAGNOSIS — R1310 Dysphagia, unspecified: Secondary | ICD-10-CM

## 2016-02-16 MED ORDER — IOHEXOL 300 MG/ML  SOLN
50.0000 mL | Freq: Once | INTRAMUSCULAR | Status: AC | PRN
  Administered 2016-02-16: 15 mL via INTRAVENOUS

## 2016-02-16 NOTE — Procedures (Signed)
Successful exchange of 24 French GJ tube with fluoroscopy.  Tip in small bowel.  No immediate complication.

## 2016-02-20 ENCOUNTER — Ambulatory Visit: Payer: Medicare Other | Admitting: *Deleted

## 2016-02-20 ENCOUNTER — Telehealth: Payer: Self-pay | Admitting: Cardiology

## 2016-02-20 NOTE — Telephone Encounter (Signed)
LMOVM reminding pt to send remote transmission.   

## 2016-02-22 ENCOUNTER — Encounter: Payer: Self-pay | Admitting: Cardiology

## 2016-03-07 ENCOUNTER — Ambulatory Visit (HOSPITAL_COMMUNITY)
Admission: RE | Admit: 2016-03-07 | Discharge: 2016-03-07 | Disposition: A | Discharge status: Home / Self Care | Payer: Medicare Other | Source: Ambulatory Visit | Attending: Interventional Radiology | Admitting: Interventional Radiology

## 2016-03-07 ENCOUNTER — Other Ambulatory Visit (HOSPITAL_COMMUNITY): Payer: Self-pay | Admitting: Interventional Radiology

## 2016-03-07 ENCOUNTER — Inpatient Hospital Stay (HOSPITAL_COMMUNITY): Admission: RE | Admit: 2016-03-07 | Payer: Self-pay | Source: Ambulatory Visit

## 2016-03-07 DIAGNOSIS — K9423 Gastrostomy malfunction: Secondary | ICD-10-CM

## 2016-03-07 DIAGNOSIS — Y733 Surgical instruments, materials and gastroenterology and urology devices (including sutures) associated with adverse incidents: Secondary | ICD-10-CM | POA: Diagnosis not present

## 2016-03-07 DIAGNOSIS — K9413 Enterostomy malfunction: Secondary | ICD-10-CM | POA: Diagnosis present

## 2016-03-07 MED ORDER — IOHEXOL 300 MG/ML  SOLN
100.0000 mL | Freq: Once | INTRAMUSCULAR | Status: AC | PRN
  Administered 2016-03-07: 15 mL via INTRAVENOUS

## 2016-03-07 NOTE — Procedures (Signed)
Interventional Radiology Procedure Note  Procedure:  Gastrojejunal feeding tube replacement  Complications:  None  Estimated Blood Loss: None  New 24 Fr balloon retention GJ tube placed over wire.  Tip in jejunum.  OK to use.  Venetia Night. Kathlene Cote, M.D Pager:  [phone]

## 2016-03-16 ENCOUNTER — Encounter: Payer: Self-pay | Admitting: Cardiology

## 2016-03-16 NOTE — Progress Notes (Signed)
Pt did not send remote transmission.

## 2016-04-02 ENCOUNTER — Ambulatory Visit (INDEPENDENT_AMBULATORY_CARE_PROVIDER_SITE_OTHER): Payer: Medicare Other | Admitting: *Deleted

## 2016-04-02 DIAGNOSIS — I441 Atrioventricular block, second degree: Secondary | ICD-10-CM

## 2016-04-02 DIAGNOSIS — Z950 Presence of cardiac pacemaker: Secondary | ICD-10-CM

## 2016-04-09 ENCOUNTER — Ambulatory Visit (HOSPITAL_COMMUNITY)
Admission: RE | Admit: 2016-04-09 | Discharge: 2016-04-09 | Disposition: A | Discharge status: Home / Self Care | Payer: Medicare Other | Source: Ambulatory Visit | Attending: Interventional Radiology | Admitting: Interventional Radiology

## 2016-04-09 ENCOUNTER — Other Ambulatory Visit (HOSPITAL_COMMUNITY): Payer: Self-pay | Admitting: Interventional Radiology

## 2016-04-09 DIAGNOSIS — Z434 Encounter for attention to other artificial openings of digestive tract: Secondary | ICD-10-CM | POA: Insufficient documentation

## 2016-04-09 DIAGNOSIS — R627 Adult failure to thrive: Secondary | ICD-10-CM | POA: Insufficient documentation

## 2016-04-09 DIAGNOSIS — R6251 Failure to thrive (child): Secondary | ICD-10-CM

## 2016-04-09 MED ORDER — LIDOCAINE VISCOUS 2 % MT SOLN
OROMUCOSAL | Status: AC
Start: 2016-04-09 — End: 2016-04-10
  Filled 2016-04-09: qty 15

## 2016-04-09 MED ORDER — IOPAMIDOL (ISOVUE-300) INJECTION 61%
INTRAVENOUS | Status: AC
  Administered 2016-04-09: 50 mL
  Filled 2016-04-09: qty 50

## 2016-04-09 NOTE — Progress Notes (Signed)
Remote pacemaker transmission.   

## 2016-05-21 ENCOUNTER — Other Ambulatory Visit (HOSPITAL_COMMUNITY): Payer: Self-pay | Admitting: Interventional Radiology

## 2016-05-21 ENCOUNTER — Ambulatory Visit (HOSPITAL_COMMUNITY)
Admission: RE | Admit: 2016-05-21 | Discharge: 2016-05-21 | Disposition: A | Discharge status: Home / Self Care | Payer: Medicare Other | Source: Ambulatory Visit | Attending: Interventional Radiology | Admitting: Interventional Radiology

## 2016-05-21 DIAGNOSIS — R633 Feeding difficulties, unspecified: Secondary | ICD-10-CM

## 2016-05-21 DIAGNOSIS — Z434 Encounter for attention to other artificial openings of digestive tract: Secondary | ICD-10-CM | POA: Diagnosis not present

## 2016-05-21 LAB — CUP PACEART REMOTE DEVICE CHECK
Battery Remaining Longevity: 120 mo
Battery Remaining Percentage: 95 %
Battery Voltage: 3.01 V
Date Time Interrogation Session: 20170522140207
Implantable Lead Implant Date: 20151210
Implantable Lead Location: 753860
Lead Channel Sensing Intrinsic Amplitude: 11.7 mV
Lead Channel Setting Sensing Adaptation Mode: 200100
MDC IDC MSMT LEADCHNL RV IMPEDANCE VALUE: 440 Ohm
MDC IDC PG SERIAL: 7683164
MDC IDC SET LEADCHNL RV PACING AMPLITUDE: 2.5 V
MDC IDC SET LEADCHNL RV PACING PULSEWIDTH: 0.4 ms
MDC IDC SET LEADCHNL RV SENSING SENSITIVITY: 2 mV
MDC IDC STAT BRADY RV PERCENT PACED: 63 %

## 2016-05-22 ENCOUNTER — Encounter: Payer: Self-pay | Admitting: Cardiology

## 2016-06-06 ENCOUNTER — Encounter: Payer: Self-pay | Admitting: Cardiology

## 2016-07-09 ENCOUNTER — Other Ambulatory Visit (HOSPITAL_COMMUNITY): Payer: Self-pay | Admitting: Interventional Radiology

## 2016-07-09 ENCOUNTER — Ambulatory Visit (HOSPITAL_COMMUNITY)
Admission: RE | Admit: 2016-07-09 | Discharge: 2016-07-09 | Disposition: A | Discharge status: Home / Self Care | Payer: Medicare Other | Source: Ambulatory Visit | Attending: Interventional Radiology | Admitting: Interventional Radiology

## 2016-07-09 ENCOUNTER — Ambulatory Visit (INDEPENDENT_AMBULATORY_CARE_PROVIDER_SITE_OTHER): Payer: Medicare Other | Admitting: *Deleted

## 2016-07-09 DIAGNOSIS — Z434 Encounter for attention to other artificial openings of digestive tract: Secondary | ICD-10-CM | POA: Diagnosis not present

## 2016-07-09 DIAGNOSIS — I441 Atrioventricular block, second degree: Secondary | ICD-10-CM | POA: Diagnosis not present

## 2016-07-09 DIAGNOSIS — T85598D Other mechanical complication of other gastrointestinal prosthetic devices, implants and grafts, subsequent encounter: Secondary | ICD-10-CM

## 2016-07-09 DIAGNOSIS — Z950 Presence of cardiac pacemaker: Secondary | ICD-10-CM

## 2016-07-09 MED ORDER — BUPIVACAINE HCL (PF) 0.5 % IJ SOLN: INTRAMUSCULAR | Status: DC | PRN

## 2016-07-09 MED ORDER — LIDOCAINE VISCOUS 2 % MT SOLN
OROMUCOSAL | Status: AC
Start: 2016-07-09 — End: 2016-07-10
  Filled 2016-07-09: qty 15

## 2016-07-09 MED ORDER — IOPAMIDOL (ISOVUE-300) INJECTION 61%
INTRAVENOUS | Status: AC
Start: 2016-07-09 — End: 2016-07-09
  Administered 2016-07-09: 15 mL
  Filled 2016-07-09: qty 50

## 2016-07-09 NOTE — Progress Notes (Signed)
Remote pacemaker transmission.   

## 2016-07-11 ENCOUNTER — Encounter: Payer: Self-pay | Admitting: Cardiology

## 2016-07-11 LAB — CUP PACEART REMOTE DEVICE CHECK
Brady Statistic RV Percent Paced: 64 %
Implantable Lead Location: 753860
Lead Channel Impedance Value: 460 Ohm
Lead Channel Setting Pacing Amplitude: 2.5 V
Lead Channel Setting Pacing Pulse Width: 0.4 ms
MDC IDC LEAD IMPLANT DT: 20151210
MDC IDC MSMT LEADCHNL RV SENSING INTR AMPL: 9.9 mV
MDC IDC SESS DTM: 20170712163007
MDC IDC SET LEADCHNL RV SENSING ADAPTATION MODE: 200100
MDC IDC SET LEADCHNL RV SENSING SENSITIVITY: 2 mV
Pulse Gen Serial Number: 7683164

## 2016-07-23 ENCOUNTER — Ambulatory Visit (HOSPITAL_COMMUNITY)
Admission: RE | Admit: 2016-07-23 | Discharge: 2016-07-23 | Disposition: A | Discharge status: Home / Self Care | Payer: Medicare Other | Source: Ambulatory Visit | Attending: Diagnostic Radiology | Admitting: Diagnostic Radiology

## 2016-07-23 ENCOUNTER — Other Ambulatory Visit (HOSPITAL_COMMUNITY): Payer: Self-pay | Admitting: Diagnostic Radiology

## 2016-07-23 DIAGNOSIS — R1310 Dysphagia, unspecified: Secondary | ICD-10-CM

## 2016-07-23 DIAGNOSIS — K9423 Gastrostomy malfunction: Secondary | ICD-10-CM | POA: Insufficient documentation

## 2016-07-23 DIAGNOSIS — Y733 Surgical instruments, materials and gastroenterology and urology devices (including sutures) associated with adverse incidents: Secondary | ICD-10-CM | POA: Insufficient documentation

## 2016-07-23 HISTORY — PX: IR GENERIC HISTORICAL: IMG1180011

## 2016-07-23 NOTE — Progress Notes (Signed)
Patient ID: [name], male   DOB: Oct 09, 1931, 82 y.o.   MRN: [id number] Patient presented with leakage around the [place] tube.  The retention disc appears to be loose or mobile.  Therefore, tube was cinched up to the anterior abdomen and tape was wrapped around the tube in order to prevent the disc from moving as much.  Patient will contact us if he continues to have problems.  Patient says that the tube is working well except for the leakage.

## 2016-07-25 ENCOUNTER — Encounter (HOSPITAL_COMMUNITY): Payer: Self-pay | Admitting: Diagnostic Radiology

## 2016-07-25 ENCOUNTER — Other Ambulatory Visit (HOSPITAL_COMMUNITY): Payer: Self-pay | Admitting: Diagnostic Radiology

## 2016-07-25 ENCOUNTER — Ambulatory Visit (HOSPITAL_COMMUNITY)
Admission: RE | Admit: 2016-07-25 | Discharge: 2016-07-25 | Disposition: A | Discharge status: Home / Self Care | Payer: Medicare Other | Source: Ambulatory Visit | Attending: Diagnostic Radiology | Admitting: Diagnostic Radiology

## 2016-07-25 ENCOUNTER — Encounter: Payer: Self-pay | Admitting: Cardiology

## 2016-07-25 DIAGNOSIS — Y733 Surgical instruments, materials and gastroenterology and urology devices (including sutures) associated with adverse incidents: Secondary | ICD-10-CM | POA: Insufficient documentation

## 2016-07-25 DIAGNOSIS — T85598D Other mechanical complication of other gastrointestinal prosthetic devices, implants and grafts, subsequent encounter: Secondary | ICD-10-CM

## 2016-07-25 DIAGNOSIS — K9413 Enterostomy malfunction: Secondary | ICD-10-CM | POA: Diagnosis present

## 2016-07-25 HISTORY — PX: IR GENERIC HISTORICAL: IMG1180011

## 2016-07-25 MED ORDER — LIDOCAINE VISCOUS 2 % MT SOLN
OROMUCOSAL | Status: AC
  Administered 2016-07-25: 15 mL
  Filled 2016-07-25: qty 15

## 2016-07-25 MED ORDER — IOPAMIDOL (ISOVUE-300) INJECTION 61%
INTRAVENOUS | Status: AC
Start: 2016-07-25 — End: 2016-07-25
  Administered 2016-07-25: 20 mL
  Filled 2016-07-25: qty 50

## 2016-07-25 NOTE — Consult Note (Addendum)
WOC consult requested for G-tube site while he was on an outpatient visit to radiology to correct leakage.  Skin underneath the G-tube insertion site is red and macerated; appearance is consistent with moisture associated skin damage related to gastic leakage around the insertion site; approx 3 cm is affected.  Applied barrier cream and gave him free samples of Calmoseptine cream.  Discussed plan of care to protect skin and repel moisture, and make sure the flange is pushed firmly against skin to avoid movement of the tube and erosion. Pt verbalized understanding and plans to discharge home this am. Please re-consult if further assistance is needed.  Thank-you,  Julien Girt MSN, [place], [place], [place], [place]

## 2016-08-30 ENCOUNTER — Encounter (HOSPITAL_COMMUNITY): Payer: Self-pay | Admitting: Interventional Radiology

## 2016-08-30 ENCOUNTER — Ambulatory Visit (HOSPITAL_COMMUNITY)
Admission: RE | Admit: 2016-08-30 | Discharge: 2016-08-30 | Disposition: A | Discharge status: Home / Self Care | Payer: Medicare Other | Source: Ambulatory Visit | Attending: Interventional Radiology | Admitting: Interventional Radiology

## 2016-08-30 ENCOUNTER — Other Ambulatory Visit (HOSPITAL_COMMUNITY): Payer: Self-pay | Admitting: Student

## 2016-08-30 DIAGNOSIS — R633 Feeding difficulties, unspecified: Secondary | ICD-10-CM

## 2016-08-30 DIAGNOSIS — Y733 Surgical instruments, materials and gastroenterology and urology devices (including sutures) associated with adverse incidents: Secondary | ICD-10-CM | POA: Diagnosis not present

## 2016-08-30 DIAGNOSIS — K9413 Enterostomy malfunction: Secondary | ICD-10-CM | POA: Diagnosis present

## 2016-08-30 DIAGNOSIS — R1310 Dysphagia, unspecified: Secondary | ICD-10-CM | POA: Diagnosis not present

## 2016-08-30 HISTORY — PX: IR GENERIC HISTORICAL: IMG1180011

## 2016-08-30 MED ORDER — LIDOCAINE VISCOUS 2 % MT SOLN
OROMUCOSAL | Status: AC
  Filled 2016-08-30: qty 15

## 2016-08-30 MED ORDER — IOPAMIDOL (ISOVUE-300) INJECTION 61%
INTRAVENOUS | Status: AC
  Administered 2016-08-30: 20 mL
  Filled 2016-08-30: qty 50

## 2016-08-30 NOTE — Procedures (Signed)
Interventional Radiology Procedure Note  Procedure:  Exchange of occluded/blocked G-J tube.    Used 62F peel-away over the tube, and then passed 035 glidewire b/w wall of the peel-away and the occluded tube to maintain post-pyloric access.    New GJ placed.   Recommendations:  - Ok to use tube - Do not submerge  - Routine care   Signed,  Dulcy Fanny. Earleen Newport, DO

## 2016-08-31 ENCOUNTER — Inpatient Hospital Stay (HOSPITAL_COMMUNITY)
Admission: EM | Admit: 2016-08-31 | Discharge: 2016-09-05 | DRG: 377 | Disposition: A | Discharge status: Home Health | Payer: Medicare Other | Attending: Internal Medicine | Admitting: Internal Medicine

## 2016-08-31 ENCOUNTER — Encounter (HOSPITAL_COMMUNITY): Payer: Self-pay

## 2016-08-31 DIAGNOSIS — D696 Thrombocytopenia, unspecified: Secondary | ICD-10-CM | POA: Diagnosis present

## 2016-08-31 DIAGNOSIS — Z0189 Encounter for other specified special examinations: Secondary | ICD-10-CM

## 2016-08-31 DIAGNOSIS — Z79891 Long term (current) use of opiate analgesic: Secondary | ICD-10-CM

## 2016-08-31 DIAGNOSIS — I2510 Atherosclerotic heart disease of native coronary artery without angina pectoris: Secondary | ICD-10-CM | POA: Diagnosis present

## 2016-08-31 DIAGNOSIS — Z66 Do not resuscitate: Secondary | ICD-10-CM | POA: Diagnosis present

## 2016-08-31 DIAGNOSIS — I10 Essential (primary) hypertension: Secondary | ICD-10-CM | POA: Diagnosis present

## 2016-08-31 DIAGNOSIS — D6959 Other secondary thrombocytopenia: Secondary | ICD-10-CM | POA: Diagnosis present

## 2016-08-31 DIAGNOSIS — I482 Chronic atrial fibrillation, unspecified: Secondary | ICD-10-CM | POA: Diagnosis present

## 2016-08-31 DIAGNOSIS — K769 Liver disease, unspecified: Secondary | ICD-10-CM

## 2016-08-31 DIAGNOSIS — Y838 Other surgical procedures as the cause of abnormal reaction of the patient, or of later complication, without mention of misadventure at the time of the procedure: Secondary | ICD-10-CM | POA: Diagnosis present

## 2016-08-31 DIAGNOSIS — Z87891 Personal history of nicotine dependence: Secondary | ICD-10-CM

## 2016-08-31 DIAGNOSIS — R042 Hemoptysis: Secondary | ICD-10-CM | POA: Diagnosis present

## 2016-08-31 DIAGNOSIS — R110 Nausea: Secondary | ICD-10-CM

## 2016-08-31 DIAGNOSIS — Z934 Other artificial openings of gastrointestinal tract status: Secondary | ICD-10-CM

## 2016-08-31 DIAGNOSIS — J4489 Other specified chronic obstructive pulmonary disease: Secondary | ICD-10-CM | POA: Diagnosis present

## 2016-08-31 DIAGNOSIS — Z950 Presence of cardiac pacemaker: Secondary | ICD-10-CM

## 2016-08-31 DIAGNOSIS — Z79899 Other long term (current) drug therapy: Secondary | ICD-10-CM

## 2016-08-31 DIAGNOSIS — I480 Paroxysmal atrial fibrillation: Secondary | ICD-10-CM | POA: Diagnosis present

## 2016-08-31 DIAGNOSIS — I739 Peripheral vascular disease, unspecified: Secondary | ICD-10-CM | POA: Diagnosis present

## 2016-08-31 DIAGNOSIS — Z7982 Long term (current) use of aspirin: Secondary | ICD-10-CM

## 2016-08-31 DIAGNOSIS — J449 Chronic obstructive pulmonary disease, unspecified: Secondary | ICD-10-CM

## 2016-08-31 DIAGNOSIS — Z931 Gastrostomy status: Secondary | ICD-10-CM

## 2016-08-31 DIAGNOSIS — E039 Hypothyroidism, unspecified: Secondary | ICD-10-CM | POA: Diagnosis not present

## 2016-08-31 DIAGNOSIS — K920 Hematemesis: Secondary | ICD-10-CM | POA: Diagnosis not present

## 2016-08-31 DIAGNOSIS — C801 Malignant (primary) neoplasm, unspecified: Secondary | ICD-10-CM

## 2016-08-31 DIAGNOSIS — Z7951 Long term (current) use of inhaled steroids: Secondary | ICD-10-CM

## 2016-08-31 DIAGNOSIS — Z8505 Personal history of malignant neoplasm of liver: Secondary | ICD-10-CM

## 2016-08-31 DIAGNOSIS — G8929 Other chronic pain: Secondary | ICD-10-CM | POA: Diagnosis present

## 2016-08-31 DIAGNOSIS — I4892 Unspecified atrial flutter: Secondary | ICD-10-CM | POA: Diagnosis present

## 2016-08-31 DIAGNOSIS — Z800 Family history of malignant neoplasm of digestive organs: Secondary | ICD-10-CM

## 2016-08-31 DIAGNOSIS — E119 Type 2 diabetes mellitus without complications: Secondary | ICD-10-CM | POA: Diagnosis present

## 2016-08-31 DIAGNOSIS — Z681 Body mass index (BMI) 19 or less, adult: Secondary | ICD-10-CM

## 2016-08-31 DIAGNOSIS — E43 Unspecified severe protein-calorie malnutrition: Secondary | ICD-10-CM | POA: Diagnosis present

## 2016-08-31 DIAGNOSIS — R160 Hepatomegaly, not elsewhere classified: Secondary | ICD-10-CM

## 2016-08-31 DIAGNOSIS — E785 Hyperlipidemia, unspecified: Secondary | ICD-10-CM | POA: Diagnosis present

## 2016-08-31 LAB — COMPREHENSIVE METABOLIC PANEL
ALBUMIN: 3.8 g/dL (ref 3.5–5.0)
ALK PHOS: 88 U/L (ref 38–126)
ALT: 18 U/L (ref 17–63)
AST: 37 U/L (ref 15–41)
Anion gap: 8 (ref 5–15)
BUN: 30 mg/dL — ABNORMAL HIGH (ref 6–20)
CALCIUM: 9.1 mg/dL (ref 8.9–10.3)
CO2: 33 mmol/L — AB (ref 22–32)
CREATININE: 0.89 mg/dL (ref 0.61–1.24)
Chloride: 101 mmol/L (ref 101–111)
GFR calc non Af Amer: >60 mL/min (ref >60)
GLUCOSE: 163 mg/dL — AB (ref 65–99)
Potassium: 3.5 mmol/L (ref 3.5–5.1)
SODIUM: 142 mmol/L (ref 135–145)
Total Bilirubin: 1.9 mg/dL — ABNORMAL HIGH (ref 0.3–1.2)
Total Protein: 7.5 g/dL (ref 6.5–8.1)

## 2016-08-31 LAB — ABO/RH: ABO/RH(D): O POS

## 2016-08-31 LAB — TYPE AND SCREEN
ABO/RH(D): O POS
Antibody Screen: NEGATIVE

## 2016-08-31 LAB — CBC
HCT: 48.3 % (ref 39.0–52.0)
Hemoglobin: 17.4 g/dL — ABNORMAL HIGH (ref 13.0–17.0)
MCH: 33.8 pg (ref 26.0–34.0)
MCHC: 36 g/dL (ref 30.0–36.0)
MCV: 93.8 fL (ref 78.0–100.0)
PLATELETS: 85 10*3/uL — AB (ref 150–400)
RBC: 5.15 MIL/uL (ref 4.22–5.81)
RDW: 13.3 % (ref 11.5–15.5)
WBC: 9.8 10*3/uL (ref 4.0–10.5)

## 2016-08-31 LAB — OCCULT BLOOD GASTRIC / DUODENUM (SPECIMEN CUP)
Occult Blood, Gastric: POSITIVE — AB
pH, Gastric: 3

## 2016-08-31 LAB — PROTIME-INR
INR: 1.08
Prothrombin Time: 14 seconds (ref 11.4–15.2)

## 2016-08-31 MED ORDER — ACETAMINOPHEN 650 MG RE SUPP: 650.0000 mg | Freq: Four times a day (QID) | RECTAL | Status: DC | PRN

## 2016-08-31 MED ORDER — ONDANSETRON HCL 4 MG/2ML IJ SOLN
4.0000 mg | Freq: Once | INTRAMUSCULAR | Status: AC
  Administered 2016-08-31: 4 mg via INTRAVENOUS
  Filled 2016-08-31: qty 2

## 2016-08-31 MED ORDER — ONDANSETRON HCL 4 MG PO TABS: 4.0000 mg | ORAL_TABLET | Freq: Four times a day (QID) | ORAL | Status: DC | PRN

## 2016-08-31 MED ORDER — SODIUM CHLORIDE 0.9 % IV SOLN: INTRAVENOUS | Status: DC

## 2016-08-31 MED ORDER — OXYCODONE HCL 5 MG PO TABS
7.5000 mg | ORAL_TABLET | Freq: Three times a day (TID) | ORAL | Status: DC | PRN
  Administered 2016-09-01 – 2016-09-02 (×2): 15 mg
  Filled 2016-08-31 (×2): qty 3

## 2016-08-31 MED ORDER — LEVOTHYROXINE SODIUM 88 MCG PO TABS
88.0000 ug | ORAL_TABLET | Freq: Every day | ORAL | Status: DC
  Administered 2016-09-01 – 2016-09-05 (×5): 88 ug
  Filled 2016-08-31 (×5): qty 1

## 2016-08-31 MED ORDER — ONDANSETRON HCL 4 MG/2ML IJ SOLN
4.0000 mg | Freq: Four times a day (QID) | INTRAMUSCULAR | Status: DC | PRN
  Administered 2016-09-02 – 2016-09-05 (×9): 4 mg via INTRAVENOUS
  Filled 2016-08-31 (×10): qty 2

## 2016-08-31 MED ORDER — ALBUTEROL SULFATE (2.5 MG/3ML) 0.083% IN NEBU: 2.5000 mg | INHALATION_SOLUTION | Freq: Four times a day (QID) | RESPIRATORY_TRACT | Status: DC | PRN

## 2016-08-31 MED ORDER — PANTOPRAZOLE SODIUM 40 MG IV SOLR
40.0000 mg | Freq: Once | INTRAVENOUS | Status: AC
  Administered 2016-08-31: 40 mg via INTRAVENOUS
  Filled 2016-08-31: qty 40

## 2016-08-31 MED ORDER — POTASSIUM CHLORIDE IN NACL 20-0.45 MEQ/L-% IV SOLN
INTRAVENOUS | Status: DC
  Administered 2016-09-01 – 2016-09-02 (×3): via INTRAVENOUS
  Administered 2016-09-02: 1000 mL via INTRAVENOUS
  Administered 2016-09-02: 05:00:00 via INTRAVENOUS
  Filled 2016-08-31 (×5): qty 1000

## 2016-08-31 MED ORDER — CYPROHEPTADINE HCL 4 MG PO TABS
4.0000 mg | ORAL_TABLET | Freq: Every day | ORAL | Status: DC
  Administered 2016-09-01 – 2016-09-05 (×5): 4 mg
  Filled 2016-08-31 (×5): qty 1

## 2016-08-31 MED ORDER — INSULIN ASPART 100 UNIT/ML ~~LOC~~ SOLN
0.0000 [IU] | SUBCUTANEOUS | Status: DC
  Administered 2016-09-01: 2 [IU] via SUBCUTANEOUS
  Administered 2016-09-03 (×2): 1 [IU] via SUBCUTANEOUS
  Administered 2016-09-04: 3 [IU] via SUBCUTANEOUS
  Administered 2016-09-04 (×2): 2 [IU] via SUBCUTANEOUS
  Administered 2016-09-04: 1 [IU] via SUBCUTANEOUS
  Administered 2016-09-04 – 2016-09-05 (×3): 2 [IU] via SUBCUTANEOUS

## 2016-08-31 MED ORDER — ACETAMINOPHEN 325 MG PO TABS: 650.0000 mg | ORAL_TABLET | Freq: Four times a day (QID) | ORAL | Status: DC | PRN
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
## 2016-08-31 NOTE — ED Notes (Signed)
Report given to nurse on [facility].

## 2016-08-31 NOTE — ED Triage Notes (Signed)
Pt reports had G-Tube replaced Yesterday at [facility]- Pt states sudden onset of blood vomit today. Denies rectal bleeding. Denies taking medications that causes black stools. Pt stable at present

## 2016-09-01 DIAGNOSIS — K920 Hematemesis: Secondary | ICD-10-CM | POA: Diagnosis not present

## 2016-09-01 DIAGNOSIS — I482 Chronic atrial fibrillation: Secondary | ICD-10-CM | POA: Diagnosis not present

## 2016-09-01 DIAGNOSIS — E038 Other specified hypothyroidism: Secondary | ICD-10-CM | POA: Diagnosis not present

## 2016-09-01 DIAGNOSIS — D696 Thrombocytopenia, unspecified: Secondary | ICD-10-CM | POA: Diagnosis not present

## 2016-09-01 DIAGNOSIS — E43 Unspecified severe protein-calorie malnutrition: Secondary | ICD-10-CM | POA: Diagnosis not present

## 2016-09-01 LAB — GLUCOSE, CAPILLARY
Glucose-Capillary: 153 mg/dL — ABNORMAL HIGH (ref 65–99)
Glucose-Capillary: 94 mg/dL (ref 65–99)
Glucose-Capillary: 97 mg/dL (ref 65–99)
Glucose-Capillary: 101 mg/dL — ABNORMAL HIGH (ref 65–99)

## 2016-09-01 LAB — COMPREHENSIVE METABOLIC PANEL
ALK PHOS: 84 U/L (ref 38–126)
ALT: 17 U/L (ref 17–63)
ANION GAP: 8 (ref 5–15)
AST: 37 U/L (ref 15–41)
Albumin: 3.4 g/dL — ABNORMAL LOW (ref 3.5–5.0)
BILIRUBIN TOTAL: 1.6 mg/dL — AB (ref 0.3–1.2)
BUN: 28 mg/dL — ABNORMAL HIGH (ref 6–20)
CALCIUM: 8.6 mg/dL — AB (ref 8.9–10.3)
CO2: 31 mmol/L (ref 22–32)
CREATININE: 0.87 mg/dL (ref 0.61–1.24)
Chloride: 103 mmol/L (ref 101–111)
Glucose, Bld: 148 mg/dL — ABNORMAL HIGH (ref 65–99)
Potassium: 3.9 mmol/L (ref 3.5–5.1)
SODIUM: 142 mmol/L (ref 135–145)
TOTAL PROTEIN: 7.2 g/dL (ref 6.5–8.1)

## 2016-09-01 LAB — CBC
HCT: 46.4 % (ref 39.0–52.0)
Hemoglobin: 16 g/dL (ref 13.0–17.0)
MCH: 33.8 pg (ref 26.0–34.0)
MCHC: 34.5 g/dL (ref 30.0–36.0)
MCV: 97.9 fL (ref 78.0–100.0)
PLATELETS: 74 10*3/uL — AB (ref 150–400)
RBC: 4.74 MIL/uL (ref 4.22–5.81)
RDW: 13.6 % (ref 11.5–15.5)
WBC: 9.9 10*3/uL (ref 4.0–10.5)

## 2016-09-01 LAB — HEMOGLOBIN AND HEMATOCRIT, BLOOD
HEMATOCRIT: 43.1 % (ref 39.0–52.0)
HEMOGLOBIN: 15 g/dL (ref 13.0–17.0)

## 2016-09-01 MED ORDER — PANTOPRAZOLE SODIUM 40 MG IV SOLR
40.0000 mg | Freq: Two times a day (BID) | INTRAVENOUS | Status: DC
  Administered 2016-09-01 – 2016-09-05 (×9): 40 mg via INTRAVENOUS
  Filled 2016-09-01 (×9): qty 40

## 2016-09-01 MED ORDER — LINAGLIPTIN 5 MG PO TABS
5.0000 mg | ORAL_TABLET | Freq: Every day | ORAL | Status: DC
  Administered 2016-09-01 – 2016-09-05 (×5): 5 mg
  Filled 2016-09-01 (×5): qty 1

## 2016-09-01 MED ORDER — ORAL CARE MOUTH RINSE
15.0000 mL | Freq: Two times a day (BID) | OROMUCOSAL | Status: DC
  Administered 2016-09-01 – 2016-09-05 (×8): 15 mL via OROMUCOSAL

## 2016-09-01 NOTE — Progress Notes (Signed)
Patient HR was 39, and at times varies to 50's to 60's. Patient has a pacemaker. Dr. Charlies Silvers notified re: this matter. Denies pain. Will continue to monitor.

## 2016-09-01 NOTE — Progress Notes (Addendum)
Patient ID: [name], male   DOB: 09-20-1931, 82 y.o.   MRN: [id number]  PROGRESS NOTE    [name]  [name] DOB: 03-29-1931 DOA: 08/31/2016  PCP: Conni Slipper, NP   Brief Narrative:  82 y.o. male with a past medical history significant for A. fib not on anticoagulation, COPD FEV1 44%, NIDDM, HTN, PCM and GJ tube, recurrent aspirations who presented to [facility] ED with hematemesis. He had no melena or hematochezia.  GJ tube had gotten clogged this week so he went to [facility] 1 day prior to the admission and this was accomplished uneventfully. He was hemodynamically stable on admission. Occult blood testing of G tube aspirate was positive.   Assessment & Plan:   Principal Problem:   Hematemesis - Likely from manipulation of GJ tube - As of now pt stable with no reports of hematemesis or hemoptysis - Continue PPI therapy  - Hold aspirin  - Hgb is WNL  Active Problems:   Hypothyroid - Continue synthroid     Protein-calorie malnutrition, severe ([place]) - May resume tube feeds    Thrombocytopenia (HCC) - Stable    Chronic atrial fibrillation ([place]) - CHADS vasc score 5 - Not on AC due to risk of bleed - Rate controlled without BB (HR 46-84)    Diabetes mellitus type 2 without complications without long term insulin use - Continue linagliptin    DVT prophylaxis: SCD's bilaterally  Code Status: DNR/DNI Family Communication: no family at the bedside this am Disposition Plan: home once stable likely in next 2-3 days    Consultants:   IR  Procedures:   None   Antimicrobials:   None     Subjective: Pt reports coughing but no vomiting.   Objective: Vitals:   08/31/16 2233 08/31/16 2319 08/31/16 2346 09/01/16 0600  BP: 179/59 149/93 (!) 152/61 126/88  Pulse: 82 71 70 (!) 46  Resp: 16 16 16 16   Temp:   98.4 F (36.9 C) 98.3 F (36.8 C)  TempSrc:   Oral Oral  SpO2: 90% (!) 16% 96% 95%  Weight:   55.4 kg (122 lb 2.2 oz)   Height:   5\' 9"  (1.753 m)      Intake/Output Summary (Last 24 hours) at 09/01/16 1136 Last data filed at 09/01/16 0400  Gross per 24 hour  Intake              300 ml  Output              120 ml  Net              180 ml   Filed Weights   08/31/16 1909 08/31/16 2346  Weight: 56.7 kg (125 lb) 55.4 kg (122 lb 2.2 oz)    Examination:  General exam: Appears calm and comfortable  Respiratory system: Clear to auscultation. Respiratory effort normal. Cardiovascular system: S1 & S2 heard, RRR. No JVD, murmurs, rubs, gallops or clicks. No pedal edema. Gastrointestinal system: Abdomen is nondistended, soft and nontender. No organomegaly or masses felt. Normal bowel sounds heard. G tube in place  Central nervous system: Alert and oriented. No focal neurological deficits. Extremities: Symmetric 5 x 5 power. Skin: No rashes, lesions or ulcers Psychiatry: Judgement and insight appear normal. Mood & affect appropriate.   Data Reviewed: I have personally reviewed following labs and imaging studies  CBC:  Recent Labs Lab 08/31/16 2036 09/01/16 0415  WBC 9.8 9.9  HGB 17.4* 16.0  HCT 48.3 46.4  MCV 93.8  97.9  PLT 85* 74*   Basic Metabolic Panel:  Recent Labs Lab 08/31/16 2036 09/01/16 0415  NA 142 142  K 3.5 3.9  CL 101 103  CO2 33* 31  GLUCOSE 163* 148*  BUN 30* 28*  CREATININE 0.89 0.87  CALCIUM 9.1 8.6*   GFR: Estimated Creatinine Clearance: 51.3 mL/min (by C-G formula based on SCr of 0.87 mg/dL). Liver Function Tests:  Recent Labs Lab 08/31/16 2036 09/01/16 0415  AST 37 37  ALT 18 17  ALKPHOS 88 84  BILITOT 1.9* 1.6*  PROT 7.5 7.2  ALBUMIN 3.8 3.4*   No results for input(s): LIPASE, AMYLASE in the last 168 hours. No results for input(s): AMMONIA in the last 168 hours. Coagulation Profile:  Recent Labs Lab 08/31/16 2041  INR 1.08   Cardiac Enzymes: No results for input(s): CKTOTAL, CKMB, CKMBINDEX, TROPONINI in the last 168 hours. BNP (last 3 results) No results for input(s):  PROBNP in the last 8760 hours. HbA1C: No results for input(s): HGBA1C in the last 72 hours. CBG:  Recent Labs Lab 09/01/16 0745  GLUCAP 153*   Lipid Profile: No results for input(s): CHOL, HDL, LDLCALC, TRIG, CHOLHDL, LDLDIRECT in the last 72 hours. Thyroid Function Tests: No results for input(s): TSH, T4TOTAL, FREET4, T3FREE, THYROIDAB in the last 72 hours. Anemia Panel: No results for input(s): VITAMINB12, FOLATE, FERRITIN, TIBC, IRON, RETICCTPCT in the last 72 hours. Urine analysis:    Component Value Date/Time   COLORURINE YELLOW 04/01/2015 1701   APPEARANCEUR CLEAR 04/01/2015 1701   LABSPEC 1.019 04/01/2015 1701   PHURINE 8.0 04/01/2015 1701   GLUCOSEU NEGATIVE 04/01/2015 1701   HGBUR NEGATIVE 04/01/2015 1701   BILIRUBINUR NEGATIVE 04/01/2015 1701   KETONESUR NEGATIVE 04/01/2015 1701   PROTEINUR NEGATIVE 04/01/2015 1701   UROBILINOGEN 1.0 04/01/2015 1701   NITRITE NEGATIVE 04/01/2015 1701   LEUKOCYTESUR TRACE (A) 04/01/2015 1701   Sepsis Labs: @LABRCNTIP (procalcitonin:4,lacticidven:4)   )No results found for this or any previous visit (from the past 240 hour(s)).    Radiology Studies: Ir Gj Tube Change Result Date: 08/30/2016 Status post exchange of occluded gastrojejunostomy, with placement of a new gastrojejunostomy, as above. Catheter ready for use.    Scheduled Meds: . cyproheptadine  4 mg Per Tube Daily  . insulin aspart  0-9 Units Subcutaneous Q4H  . levothyroxine  88 mcg Per Tube QAC breakfast  . mouth rinse  15 mL Mouth Rinse BID  . pantoprazole  40 mg Intravenous Q12H   Continuous Infusions: . 0.45 % NaCl with KCl 20 mEq / L 75 mL/hr at 09/01/16 0128     LOS: 0 days    Time spent: 15 minutes  Greater than 50% of the time spent on counseling and coordinating the care.   Leisa Lenz, MD [facility] Pager [phone]  If 7PM-7AM, please contact night-coverage [email or web address] Password [facility] 09/01/2016, 11:36 AM

## 2016-09-01 NOTE — Care Management (Signed)
Discussed with hospitalist, they do note believe a formal GI consult is necessary. Explained that we would be happy to help if this becomes necessary, but for now will remove patient from consult list.  Ellouise Newer, PA-C

## 2016-09-01 NOTE — H&P (Signed)
History and Physical  Patient Name: [name]     [id number]    DOB: [birth year]    DOA: 08/31/2016 PCP: Conni Slipper, NP  Cardiology: Gwenlyn Found Pulmonology: Cephus Richer     Patient coming from: Home  Chief Complaint: Hematemesis  HPI: [name] is a 82 y.o. male with a past medical history significant for A. fib not on anticoagulation, COPD FEV1 44%, NIDDM, HTN, PCM, and GJ tube because of recurrent aspirations who presents with hematemesis.  The patient's [place] tube had gotten clogged this week, so he went to [facility] yesterday (Thursday) to have it exchanged under radiographic guidance by Dr. Earleen Newport. Per notes, this was accomplished uneventfully.  Today, he had a few episodes of coffee-ground emesis, and so he came to the ER to get checked out. He had no melena, hematochezia. He denies NSAID use. He is not on an anticoagulant.  ED course: -Afebrile, heart rate 60s, respirations 16, blood pressure 160/100, pulse oximetry normal -Na 142, K 3.5, Cr 0.9 (baseline), WBC 9.8 K, Hgb 17 -Occult blood testing of G tube aspirate was positive -He was given Protonix IV and the case was discussed with Dr. Silverio Decamp who recommended continued PPI, serial H/H and [place] would see in morning     ROS: Review of Systems  Gastrointestinal: Positive for nausea and vomiting. Negative for abdominal pain, blood in stool and melena.       Coffee ground emesis  All other systems reviewed and are negative.         Past Medical History:  Diagnosis Date  . Back pain   . COPD (chronic obstructive pulmonary disease) (HCC)    [place]  . Coronary artery disease   . Dissecting aortic aneurysm, abdominal ([place]) 11/13/2011   Overview:  Severe aortoiliac stenosis with possible a chronic dissection versus  ulcerated plaque/thrombus extending above and below the aortic  bifurcation; without aneurysmal dilation  as per CT abdomen noted 09/25/2011   . DM2 (diabetes mellitus, type 2) ([place])    takes  Tradjenta daily  . Dysphagia    s/p PEG placement  . [place] (hepatocellular carcinoma) ([place])    s/p right lobe resection 2012  . Hyperlipidemia   . Hypertension   . Hypothyroidism    takes Synthroid daily   . Pacemaker 11/11/2015   St. Jude single chamber pacemaker implanted [facility] 11/2014.  Marland Kitchen Paroxysmal atrial flutter (HCC)    and fibrillation with known bradycardia, Chads2vasc of 4, not on A/C due to thrombocytopenia and functional status  . Peripheral vascular disease ([place])    LICA stent [id number]  . Pneumonia 11/03/2012   aspiration pneumonias  . Pneumothorax 10/2015  . Presence of permanent cardiac pacemaker   . Severe malnutrition ([place])   . Shortness of breath dyspnea    with exertion   . Thrombocytopenia ([place])     Past Surgical History:  Procedure Laterality Date  . CAROTID ANGIOGRAM  2012  . CAROTID STENT  2002  . cataract surgery      bilateral  . COLONOSCOPY    . DOPPLER ECHOCARDIOGRAPHY  2011  . ESOPHAGOGASTRODUODENOSCOPY  10/01/2012   Procedure: ESOPHAGOGASTRODUODENOSCOPY (EGD);  Surgeon: Gwenyth Ober, MD;  Location: [place];  Service: General;  Laterality: N/A;  wyatt/leone  . ESOPHAGOGASTRODUODENOSCOPY  10/31/2012   Procedure: ESOPHAGOGASTRODUODENOSCOPY (EGD);  Surgeon: Beryle Beams, MD;  Location: [facility] ENDOSCOPY;  Service: Endoscopy;  Laterality: N/A;  . IR GENERIC HISTORICAL  07/25/2016   IR REPLC DUODEN/JEJUNO TUBE PERCUT W/FLUORO  07/25/2016 Markus Daft, MD [facility]  . IR GENERIC HISTORICAL  08/30/2016   IR GJ TUBE CHANGE 08/30/2016 Corrie Mckusick, DO [facility]  . liver canc    . pacemaker inserted    . PARS PLANA VITRECTOMY Right 11/14/2015   Procedure: PARS PLANA VITRECTOMY WITH 25 GAUGE, WITH ENDOLASER;  Surgeon: Hurman Horn, MD;  Location: [place];  Service: Ophthalmology;  Laterality: Right;  . PEG PLACEMENT  10/01/2012   Procedure: PERCUTANEOUS ENDOSCOPIC GASTROSTOMY (PEG) PLACEMENT;  Surgeon: Gwenyth Ober, MD;  Location: [place];  Service:  General;  Laterality: N/A;  . PEG TUBE PLACEMENT  10/12/2015      replacment tube  . POSTERIOR CERVICAL FUSION/FORAMINOTOMY  09/25/2012   Procedure: POSTERIOR CERVICAL FUSION/FORAMINOTOMY LEVEL 2;  Surgeon: Eustace Moore, MD;  Location: [place] NEURO ORS;  Service: Neurosurgery;  Laterality: N/A;  Posterior Cervical three-four laminectomy, posterior cervical three-four, four-five fusion    Social History: Patient lives with his daughter.  The patient walks unassisted.  He is a former smoker.    No Known Allergies  Family history: family history includes Cancer in his father; Liver disease in his brother.  Prior to Admission medications   Medication Sig Start Date End Date Taking? Authorizing Provider  albuterol (PROVENTIL) (2.5 MG/3ML) 0.083% nebulizer solution Take 3 mLs (2.5 mg total) by nebulization every 6 (six) hours as needed for wheezing or shortness of breath. 09/12/14  Yes Oswald Hillock, MD  aspirin EC 81 MG tablet Place 81 mg into feeding tube daily.    Yes Historical Provider, MD  budesonide (PULMICORT) 0.25 MG/2ML nebulizer solution Take 0.25 mg by nebulization every 6 (six) hours.  05/24/15  Yes Historical Provider, MD  cyproheptadine (PERIACTIN) 4 MG tablet Place 4 mg into feeding tube daily.  05/24/15  Yes Historical Provider, MD  ergocalciferol (VITAMIN D2) 50000 units capsule Take 50,000 Units by mouth once a week.   Yes Historical Provider, MD  levothyroxine (SYNTHROID, LEVOTHROID) 88 MCG tablet Take 88 mcg by mouth daily.   Yes Historical Provider, MD  linagliptin (TRADJENTA) 5 MG TABS tablet Place 5 mg into feeding tube daily.    Yes Historical Provider, MD  Nutritional Supplements (FEEDING SUPPLEMENT, JEVITY 1.5 CAL,) LIQD Place 1,100 mLs into feeding tube continuous. At bedtime at a rate of 132ml/hr.   Yes Historical Provider, MD  oxyCODONE (ROXICODONE) 15 MG immediate release tablet Place 7.5-15 mg into feeding tube See admin instructions. Take 1/2 - 1 tablet by feeding tube twice  daily (depending on pain level), may take an additional 1/2 -1 tablet at noon if needed for pain 09/09/15  Yes Historical Provider, MD  polyethylene glycol (MIRALAX / GLYCOLAX) packet Place 8.5 g into feeding tube 2 (two) times daily.  11/09/15  Yes Historical Provider, MD  tiotropium (SPIRIVA) 18 MCG inhalation capsule Place 18 mcg into inhaler and inhale 2 (two) times daily.   Yes Historical Provider, MD       Physical Exam: BP (!) 152/61 (BP Location: Right Arm) Comment: RN Notified  Pulse 70   Temp 98.4 F (36.9 C) (Oral)   Resp 16   Ht 5\' 9"  (1.753 m)   Wt 55.4 kg (122 lb 2.2 oz)   SpO2 96%   BMI 18.04 kg/m  General appearance: Thin frail elderly adult male, alert and in no acute distress.   Eyes: Anicteric, conjunctiva pink, lids and lashes normal.     ENT: No nasal deformity, discharge, or epistaxis.  OP moist without  lesions.  Edentulous. Lymph: No cervical or supraclavicular lymphadenopathy. Skin: Warm and dry.  No jaundice.  No suspicious rashes or lesions.  Diffuse dry scaled skin. Cardiac: RRR, nl S1-S2, no murmurs appreciated.  Capillary refill is brisk.  JVP normal.  No LE edema.  Radial and DP pulses 2+ and symmetric. Respiratory: Normal respiratory rate and rhythm.  CTAB without rales, rare wheeze. GI: Abdomen soft without rigidity.  No focal TTP. There is some dried coffee colored liquid on the gauze around his G tube, and daughter points out a white protective barrier she uses on it.  No evidence of ifnection, no ongoing bleeding. No ascites, distension, hepatosplenomegaly.   MSK: No deformities or effusions.  No clubbing/cyanosis. Neuro: Sensorium intact and responding to questions, attention normal.  Speech is fluent.  Moves all extremities equally and with normal coordination.    Psych: Affect blunted.  Judgment and insight appear normal.       Labs on Admission:  I have personally reviewed following labs and imaging studies: CBC:  Recent Labs Lab 08/31/16 2036   WBC 9.8  HGB 17.4*  HCT 48.3  MCV 93.8  PLT 85*   Basic Metabolic Panel:  Recent Labs Lab 08/31/16 2036  NA 142  K 3.5  CL 101  CO2 33*  GLUCOSE 163*  BUN 30*  CREATININE 0.89  CALCIUM 9.1   GFR: Estimated Creatinine Clearance: 50.1 mL/min (by C-G formula based on SCr of 0.89 mg/dL).  Liver Function Tests:  Recent Labs Lab 08/31/16 2036  AST 37  ALT 18  ALKPHOS 88  BILITOT 1.9*  PROT 7.5  ALBUMIN 3.8   Coagulation Profile:  Recent Labs Lab 08/31/16 2041  INR 1.08         Radiological Exams on Admission: Reports personally reviewed: Ir Gj Tube Change  Result Date: 08/30/2016 INDICATION: 82 year old male with a history of dysphagia. His gastrojejunostomy tube has become occluded. EXAM: JEJUNAL CATHETER REPLACEMENT MEDICATIONS: None ANESTHESIA/SEDATION: None CONTRAST:  20 cc - administered into the intestinal lumen. FLUOROSCOPY TIME:  Fluoroscopy Time: 2 minutes 54 seconds (4.9 mGy). COMPLICATIONS: None immediate. PROCEDURE: Informed written consent was obtained from the patient after a thorough discussion of the procedural risks, benefits and alternatives. All questions were addressed. Maximal Sterile Barrier Technique was utilized including caps, mask, sterile gowns, sterile gloves, sterile drape, hand hygiene and skin antiseptic. A timeout was performed prior to the initiation of the procedure. Patient is position on the table in the supine position. Scout image was acquired. Initial attempt to pass a stiff Glidewire through the lumen of the tube was unsuccessful. Balloon was then deflated, and the gastrojejunostomy was partially withdrawn from the intestine. The jejunostomy catheter was then ligated approximately 6 inches from the skin ostomy. In order to maintain the access into the jejunum in a post pyloric location, a 5 French peel-away sheath was advanced over the jejunostomy tube and into a location that was post pyloric. The Glidewire was then inserted into  the lumen of the peel-away sheath, between the walls of the peel-away sheath and the occluded jejunal catheter. Once the Glidewire was post pyloric, the jejunal tube and sheath were removed. Angled [place] the catheter was then advanced over the Glidewire to navigate the Glidewire further into the jejunum for better purchase. Catheter was then removed from the [place]. A new gastrojejunostomy was placed over the Glidewire with adequate lubrication. Glidewire was removed. Balloon was inflated with approximately 8 cc of saline. Contrast infused into the catheter confirmed location.  Patient tolerated the procedure well and remained hemodynamically stable throughout. No complications were encountered and no significant blood loss encountered. IMPRESSION: Status post exchange of occluded gastrojejunostomy, with placement of a new gastrojejunostomy, as above. Catheter ready for use. Signed, Dulcy Fanny. Earleen Newport, DO [facility] Electronically Signed   By: Corrie Mckusick D.O.   On: 08/30/2016 14:59      Assessment/Plan 1. Hematemesis:  Suspect that this was some trauma from [place] tube placement, likely self-limited.  No risk factors for ulcer.  No evidence for perforation/peritonitis on exam.   -PPI ordered for GI protection -Ondansetron for nausea -Consult to [place], appreciate cares -NPO for now and MIVF -Continue home periactin    2. Hypothyroidism:  -Continue levothyroxine  3. Chronic pain:  -Continue oxycodone PRN as at home  4. Afib:  CHADS2Vasc 5, not considered a warfarin candidate.  Rate controlled.  5. Thrombocytopenia:  Stable.  6. COPD: Not clinically active.     DVT prophylaxis: SCDs  Code Status: DO NOT RESUSCITATE  Family Communication: Daughter and grandchildren at bedside. CODE STATUS confirmed.  Disposition Plan: Anticipate nil per tube except meds, control acid and nausea, and consult to GI tomorrow.  Final  disposition per GI. Consults called: GI, Dr. Silverio Decamp Admission status: OBS, med surg    Medical decision making: Patient seen at 10:50 AM on 08/31/2016.  The patient was discussed with Dr. Regenia Skeeter. What exists of the patient's chart was reviewed in depth.  Clinical condition: stable.        Edwin Dada [facility] Pager [phone]

## 2016-09-02 ENCOUNTER — Observation Stay (HOSPITAL_COMMUNITY): Payer: Medicare Other

## 2016-09-02 ENCOUNTER — Encounter (HOSPITAL_COMMUNITY): Payer: Self-pay | Admitting: *Deleted

## 2016-09-02 DIAGNOSIS — E038 Other specified hypothyroidism: Secondary | ICD-10-CM | POA: Diagnosis not present

## 2016-09-02 DIAGNOSIS — I482 Chronic atrial fibrillation: Secondary | ICD-10-CM | POA: Diagnosis not present

## 2016-09-02 DIAGNOSIS — D696 Thrombocytopenia, unspecified: Secondary | ICD-10-CM | POA: Diagnosis not present

## 2016-09-02 DIAGNOSIS — K920 Hematemesis: Secondary | ICD-10-CM | POA: Diagnosis not present

## 2016-09-02 LAB — GLUCOSE, CAPILLARY
GLUCOSE-CAPILLARY: 110 mg/dL — AB (ref 65–99)
GLUCOSE-CAPILLARY: 72 mg/dL (ref 65–99)
Glucose-Capillary: 77 mg/dL (ref 65–99)
Glucose-Capillary: 80 mg/dL (ref 65–99)
Glucose-Capillary: 86 mg/dL (ref 65–99)
Glucose-Capillary: 93 mg/dL (ref 65–99)
Glucose-Capillary: 96 mg/dL (ref 65–99)

## 2016-09-02 LAB — HEMOGLOBIN AND HEMATOCRIT, BLOOD
HCT: 43.3 % (ref 39.0–52.0)
HEMATOCRIT: 43.1 % (ref 39.0–52.0)
HEMOGLOBIN: 14.8 g/dL (ref 13.0–17.0)
HEMOGLOBIN: 15 g/dL (ref 13.0–17.0)

## 2016-09-02 MED ORDER — KCL IN DEXTROSE-NACL 20-5-0.45 MEQ/L-%-% IV SOLN
INTRAVENOUS | Status: DC
  Administered 2016-09-02: 1000 mL via INTRAVENOUS
  Administered 2016-09-03 – 2016-09-04 (×3): via INTRAVENOUS
  Filled 2016-09-02 (×5): qty 1000

## 2016-09-02 MED ORDER — IOPAMIDOL (ISOVUE-300) INJECTION 61%
25.0000 mL | Freq: Once | INTRAVENOUS | Status: AC | PRN
  Administered 2016-09-02: 25 mL via ORAL

## 2016-09-02 MED ORDER — DEXTROSE-NACL 5-0.45 % IV SOLN: INTRAVENOUS | Status: DC

## 2016-09-02 NOTE — Care Management Note (Signed)
Case Management Note  Patient Details  Name: AGNES WALLEN MRN: [id number] Date of Birth: [birth year]  Subjective/Objective:   Hematemesis                  Action/Plan: Discharge Planning:  NCM spoke to pt and Kinnie Feil # [phone] at bedside. Dtr states pt lives at home with an elderly friend. States she visit pt daily to assist him as needed. He has RW, cane, tub bench at home. He gets tube feeding at night. States she orders feedings monthly through [facility]. States he had HH with [facility] in the past, but states "pt's PCP has not continued his HH once dc, agency would come out to do initial assessment but HH was not ongoing. Dtr has discussed with pt other options for PCP. Pt prefers his current PCP. Requesting HH with [facility] if [place] ordered. Waiting final recommendations for home.   PCP- Conni Slipper MD   Expected Discharge Date:  09/03/2016            Expected Discharge Plan:  [place]  In-House Referral:  NA  Discharge planning Services  CM Consult  Post Acute Care Choice:  Home Health Choice offered to:  Adult Children  DME Arranged:  N/A DME Agency:  NA  HH Arranged:  RN, PT, Nurse's Aide [place] Agency:  [place]  Status of Service:  In process, will continue to follow  If discussed at Long Length of Stay Meetings, dates discussed:    Additional Comments:  Erenest Rasher, RN 09/02/2016, 5:58 PM

## 2016-09-02 NOTE — Consult Note (Signed)
[place] Nurse wound consult note Reason for Consult: Moisture Associated Skin Damage to G tube site.  New tube recently placed.  Wound type:MASD to peristomal (G tube site) Pressure Ulcer POA: N/A Measurement: 2 cm erythema present circumferentially.  Denuded 0.5 cm x 0.1 cm at 6 o'clock.   Wound [id number] and moist Drainage (amount, consistency, odor) Area is consistently moist from weeping and tube.  Periwound:Erythema Dressing procedure/placement/frequency:Cleanse G tube site with soap and water and pat gently dry.  Apply silver hydrofiber (cut as drain sponge) to G tube site.  Cover with drain sponge and tape.  Change daily.  Will not follow at this time.  Please re-consult if needed.  Domenic Moras RN BSN [place] Pager [phone]

## 2016-09-02 NOTE — Care Management Obs Status (Signed)
[place] NOTIFICATION   Patient Details  Name: [name] MRN: [id number] Date of Birth: 1931/03/18   Medicare Observation Status Notification Given:  Yes    Erenest Rasher, RN 09/02/2016, 6:05 PM

## 2016-09-02 NOTE — Progress Notes (Addendum)
Patient ID: [name], male   DOB: 08/04/1931, 82 y.o.   MRN: [id number]  PROGRESS NOTE    LAD PLANE  [id number] DOB: 03-21-1931 DOA: 08/31/2016  PCP: Conni Slipper, NP   Brief Narrative:  82 y.o. male with a past medical history significant for A. fib not on anticoagulation, COPD FEV1 44%, NIDDM, HTN, PCM and GJ tube, recurrent aspirations who presented to [facility] ED with hematemesis. He had no melena or hematochezia.  GJ tube had gotten clogged this week so he went to [facility] 1 day prior to the admission and this was accomplished uneventfully. He was hemodynamically stable on admission. Occult blood testing of G tube aspirate was positive.   Assessment & Plan:   Principal Problem:   Hematemesis - Likely from manipulation of GJ tube - Spoke with GI and no official consult needed yet but if pt continues to have hemoptysis will re-consult - As of now pt stable with no reports of hematemesis or hemoptysis - He mentions G tube is bothering him and he feels as if the part of the tube is pushed way to o long into the stomach - Will get CT for evaluation  - Continue PPI therapy  - Holding aspirin  - Hgb is WNL  Active Problems:   Hypothyroid - Continue synthroid     Protein-calorie malnutrition, severe (HCC) - Continue tube feeds    Thrombocytopenia (HCC) - Stable    Chronic atrial fibrillation ([place]) - CHADS vasc score 5 - Not on AC due to risk of bleed - Rate controlled without BB    Diabetes mellitus type 2 without complications without long term insulin use - Continue linagliptin    DVT prophylaxis: SCD's bilaterally  Code Status: DNR/DNI Family Communication: no family at the bedside this am Disposition Plan: home once stable    Consultants:   IR  Procedures:   None   Antimicrobials:   None     Subjective: No overnight events.    Objective: Vitals:   09/01/16 1420 09/01/16 1435 09/01/16 2050 09/02/16 0525  BP:   (!) 155/53 (!) 142/60    Pulse: (!) 39 60  (!) 56  Resp:   15 14  Temp:   98.4 F (36.9 C) 98 F (36.7 C)  TempSrc:   Oral Oral  SpO2:   95% 97%  Weight:      Height:        Intake/Output Summary (Last 24 hours) at 09/02/16 1251 Last data filed at 09/02/16 1030  Gross per 24 hour  Intake           1342.1 ml  Output              550 ml  Net            792.1 ml   Filed Weights   08/31/16 1909 08/31/16 2346  Weight: 56.7 kg (125 lb) 55.4 kg (122 lb 2.2 oz)    Examination:  General exam: Appears calm and comfortable, no acute distress  Respiratory system: NO wheezing, no rhonchi  Cardiovascular system: S1 & S2 heard, Rate controlled.  Gastrointestinal system: G tube in place, non tender  Central nervous system: No focal neurological deficits. Extremities: No edema, palpable pulses Skin: warm and dry  Psychiatry: Mood & affect appropriate.   Data Reviewed: I have personally reviewed following labs and imaging studies  CBC:  Recent Labs Lab 08/31/16 2036 09/01/16 0415 09/01/16 1601 09/02/16 0426  WBC 9.8 9.9  --   --  HGB 17.4* 16.0 15.0 14.8  HCT 48.3 46.4 43.1 43.3  MCV 93.8 97.9  --   --   PLT 85* 74*  --   --    Basic Metabolic Panel:  Recent Labs Lab 08/31/16 2036 09/01/16 0415  NA 142 142  K 3.5 3.9  CL 101 103  CO2 33* 31  GLUCOSE 163* 148*  BUN 30* 28*  CREATININE 0.89 0.87  CALCIUM 9.1 8.6*   GFR: Estimated Creatinine Clearance: 51.3 mL/min (by C-G formula based on SCr of 0.87 mg/dL). Liver Function Tests:  Recent Labs Lab 08/31/16 2036 09/01/16 0415  AST 37 37  ALT 18 17  ALKPHOS 88 84  BILITOT 1.9* 1.6*  PROT 7.5 7.2  ALBUMIN 3.8 3.4*   No results for input(s): LIPASE, AMYLASE in the last 168 hours. No results for input(s): AMMONIA in the last 168 hours. Coagulation Profile:  Recent Labs Lab 08/31/16 2041  INR 1.08   Cardiac Enzymes: No results for input(s): CKTOTAL, CKMB, CKMBINDEX, TROPONINI in the last 168 hours. BNP (last 3 results) No  results for input(s): PROBNP in the last 8760 hours. HbA1C: No results for input(s): HGBA1C in the last 72 hours. CBG:  Recent Labs Lab 09/01/16 1950 09/02/16 0029 09/02/16 0522 09/02/16 0758 09/02/16 1203  GLUCAP 101* 93 110* 96 86   Lipid Profile: No results for input(s): CHOL, HDL, LDLCALC, TRIG, CHOLHDL, LDLDIRECT in the last 72 hours. Thyroid Function Tests: No results for input(s): TSH, T4TOTAL, FREET4, T3FREE, THYROIDAB in the last 72 hours. Anemia Panel: No results for input(s): VITAMINB12, FOLATE, FERRITIN, TIBC, IRON, RETICCTPCT in the last 72 hours. Urine analysis:    Component Value Date/Time   COLORURINE YELLOW 04/01/2015 1701   APPEARANCEUR CLEAR 04/01/2015 1701   LABSPEC 1.019 04/01/2015 1701   PHURINE 8.0 04/01/2015 1701   GLUCOSEU NEGATIVE 04/01/2015 1701   HGBUR NEGATIVE 04/01/2015 1701   BILIRUBINUR NEGATIVE 04/01/2015 1701   KETONESUR NEGATIVE 04/01/2015 1701   PROTEINUR NEGATIVE 04/01/2015 1701   UROBILINOGEN 1.0 04/01/2015 1701   NITRITE NEGATIVE 04/01/2015 1701   LEUKOCYTESUR TRACE (A) 04/01/2015 1701   Sepsis Labs: @LABRCNTIP (procalcitonin:4,lacticidven:4)   )No results found for this or any previous visit (from the past 240 hour(s)).    Radiology Studies: Ir Gj Tube Change Result Date: 08/30/2016 Status post exchange of occluded gastrojejunostomy, with placement of a new gastrojejunostomy, as above. Catheter ready for use.    Scheduled Meds: . cyproheptadine  4 mg Per Tube Daily  . insulin aspart  0-9 Units Subcutaneous Q4H  . levothyroxine  88 mcg Per Tube QAC breakfast  . linagliptin  5 mg Per Tube Daily  . mouth rinse  15 mL Mouth Rinse BID  . pantoprazole  40 mg Intravenous Q12H   Continuous Infusions: . 0.45 % NaCl with KCl 20 mEq / L 75 mL/hr at 09/02/16 0439     LOS: 0 days    Time spent: 15 minutes  Greater than 50% of the time spent on counseling and coordinating the care.   Leisa Lenz, MD [facility] Pager [phone]  If 7PM-7AM, please contact night-coverage [email or web address] Password [email or web address] 09/02/2016, 12:51 PM

## 2016-09-02 NOTE — Progress Notes (Signed)
G tube site cleansed with soap and water. Silver hydrofiber applied as ordered and covered with drain sponge, taped.

## 2016-09-02 NOTE — Progress Notes (Signed)
Paged Dr. Charlies Silvers on the results of CT abdomen. No orders at this time.

## 2016-09-03 DIAGNOSIS — Z79891 Long term (current) use of opiate analgesic: Secondary | ICD-10-CM | POA: Diagnosis not present

## 2016-09-03 DIAGNOSIS — R042 Hemoptysis: Secondary | ICD-10-CM | POA: Diagnosis present

## 2016-09-03 DIAGNOSIS — Z931 Gastrostomy status: Secondary | ICD-10-CM | POA: Diagnosis not present

## 2016-09-03 DIAGNOSIS — Z66 Do not resuscitate: Secondary | ICD-10-CM | POA: Diagnosis present

## 2016-09-03 DIAGNOSIS — Z681 Body mass index (BMI) 19 or less, adult: Secondary | ICD-10-CM | POA: Diagnosis not present

## 2016-09-03 DIAGNOSIS — E43 Unspecified severe protein-calorie malnutrition: Secondary | ICD-10-CM | POA: Diagnosis not present

## 2016-09-03 DIAGNOSIS — Z87891 Personal history of nicotine dependence: Secondary | ICD-10-CM | POA: Diagnosis not present

## 2016-09-03 DIAGNOSIS — Z800 Family history of malignant neoplasm of digestive organs: Secondary | ICD-10-CM | POA: Diagnosis not present

## 2016-09-03 DIAGNOSIS — I2510 Atherosclerotic heart disease of native coronary artery without angina pectoris: Secondary | ICD-10-CM | POA: Diagnosis present

## 2016-09-03 DIAGNOSIS — K769 Liver disease, unspecified: Secondary | ICD-10-CM | POA: Diagnosis not present

## 2016-09-03 DIAGNOSIS — I739 Peripheral vascular disease, unspecified: Secondary | ICD-10-CM | POA: Diagnosis present

## 2016-09-03 DIAGNOSIS — I10 Essential (primary) hypertension: Secondary | ICD-10-CM | POA: Diagnosis present

## 2016-09-03 DIAGNOSIS — E038 Other specified hypothyroidism: Secondary | ICD-10-CM | POA: Diagnosis not present

## 2016-09-03 DIAGNOSIS — I480 Paroxysmal atrial fibrillation: Secondary | ICD-10-CM | POA: Diagnosis present

## 2016-09-03 DIAGNOSIS — J449 Chronic obstructive pulmonary disease, unspecified: Secondary | ICD-10-CM | POA: Diagnosis present

## 2016-09-03 DIAGNOSIS — I4892 Unspecified atrial flutter: Secondary | ICD-10-CM | POA: Diagnosis present

## 2016-09-03 DIAGNOSIS — D6959 Other secondary thrombocytopenia: Secondary | ICD-10-CM | POA: Diagnosis present

## 2016-09-03 DIAGNOSIS — E785 Hyperlipidemia, unspecified: Secondary | ICD-10-CM | POA: Diagnosis present

## 2016-09-03 DIAGNOSIS — Y838 Other surgical procedures as the cause of abnormal reaction of the patient, or of later complication, without mention of misadventure at the time of the procedure: Secondary | ICD-10-CM | POA: Diagnosis present

## 2016-09-03 DIAGNOSIS — E039 Hypothyroidism, unspecified: Secondary | ICD-10-CM | POA: Diagnosis present

## 2016-09-03 DIAGNOSIS — Z8505 Personal history of malignant neoplasm of liver: Secondary | ICD-10-CM | POA: Diagnosis not present

## 2016-09-03 DIAGNOSIS — E119 Type 2 diabetes mellitus without complications: Secondary | ICD-10-CM | POA: Diagnosis present

## 2016-09-03 DIAGNOSIS — K920 Hematemesis: Secondary | ICD-10-CM | POA: Diagnosis present

## 2016-09-03 DIAGNOSIS — Z7982 Long term (current) use of aspirin: Secondary | ICD-10-CM | POA: Diagnosis not present

## 2016-09-03 DIAGNOSIS — Z950 Presence of cardiac pacemaker: Secondary | ICD-10-CM | POA: Diagnosis not present

## 2016-09-03 DIAGNOSIS — I482 Chronic atrial fibrillation: Secondary | ICD-10-CM | POA: Diagnosis not present

## 2016-09-03 DIAGNOSIS — G8929 Other chronic pain: Secondary | ICD-10-CM | POA: Diagnosis present

## 2016-09-03 LAB — GLUCOSE, CAPILLARY
GLUCOSE-CAPILLARY: 126 mg/dL — AB (ref 65–99)
GLUCOSE-CAPILLARY: 102 mg/dL — AB (ref 65–99)
GLUCOSE-CAPILLARY: 133 mg/dL — AB (ref 65–99)
Glucose-Capillary: 111 mg/dL — ABNORMAL HIGH (ref 65–99)
Glucose-Capillary: 105 mg/dL — ABNORMAL HIGH (ref 65–99)

## 2016-09-03 LAB — HEMOGLOBIN AND HEMATOCRIT, BLOOD
HCT: 40.6 % (ref 39.0–52.0)
HEMATOCRIT: 43.5 % (ref 39.0–52.0)
HEMOGLOBIN: 15.2 g/dL (ref 13.0–17.0)
HEMOGLOBIN: 14.5 g/dL (ref 13.0–17.0)

## 2016-09-03 LAB — CBC
HCT: 43.4 % (ref 39.0–52.0)
Hemoglobin: 15.3 g/dL (ref 13.0–17.0)
MCH: 33.3 pg (ref 26.0–34.0)
MCHC: 35.3 g/dL (ref 30.0–36.0)
MCV: 94.6 fL (ref 78.0–100.0)
Platelets: 81 10*3/uL — ABNORMAL LOW (ref 150–400)
RBC: 4.59 MIL/uL (ref 4.22–5.81)
RDW: 13.2 % (ref 11.5–15.5)
WBC: 7.5 10*3/uL (ref 4.0–10.5)

## 2016-09-03 MED ORDER — MORPHINE SULFATE (PF) 4 MG/ML IV SOLN: 4.0000 mg | Freq: Once | INTRAVENOUS | Status: DC

## 2016-09-03 MED ORDER — MORPHINE SULFATE (PF) 2 MG/ML IV SOLN: 2.0000 mg | INTRAVENOUS | Status: DC | PRN

## 2016-09-03 MED ORDER — DEXTROMETHORPHAN POLISTIREX ER 30 MG/5ML PO SUER
30.0000 mg | Freq: Four times a day (QID) | ORAL | Status: DC | PRN
  Filled 2016-09-03: qty 5

## 2016-09-03 MED ORDER — JEVITY 1.2 CAL PO LIQD
1000.0000 mL | ORAL | Status: DC
  Administered 2016-09-03 – 2016-09-05 (×3): 1000 mL

## 2016-09-03 MED ORDER — BACLOFEN 5 MG HALF TABLET
5.0000 mg | ORAL_TABLET | Freq: Three times a day (TID) | ORAL | Status: DC
  Administered 2016-09-03 – 2016-09-05 (×7): 5 mg
  Filled 2016-09-03 (×9): qty 1

## 2016-09-03 MED ORDER — DEXTROMETHORPHAN POLISTIREX ER 30 MG/5ML PO SUER
30.0000 mg | Freq: Four times a day (QID) | ORAL | Status: DC | PRN
  Administered 2016-09-03 – 2016-09-05 (×4): 30 mg
  Filled 2016-09-03 (×3): qty 5

## 2016-09-03 NOTE — Progress Notes (Signed)
Nursing Note: G-tube site dsg saturated.A; Cleansed and new dsg applied.Skin at site red, denuded.wbb

## 2016-09-03 NOTE — Progress Notes (Addendum)
Patient ID: [name], male   DOB: 1931/06/06, 82 y.o.   MRN: [id number]  PROGRESS NOTE    [name]  [id number] DOB: 1931/01/27 DOA: 08/31/2016  PCP: Conni Slipper, NP   Brief Narrative:  82 y.o. male with a past medical history significant for A. fib not on anticoagulation, COPD FEV1 44%, NIDDM, HTN, PCM and GJ tube, recurrent aspirations who presented to [facility] ED with hematemesis. He had no melena or hematochezia.  GJ tube had gotten clogged this week so he went to [facility] 1 day prior to the admission and this was accomplished uneventfully. He was hemodynamically stable on admission. Occult blood testing of G tube aspirate was positive.   Assessment & Plan:   Principal Problem:   Hematemesis - Likely from manipulation of GJ tube - No further hemoptysis - Continue PPI therapy  - Holding aspirin   Active Problems:   Hypothyroid - Continue synthroid     Liver lesions - Liver lesions seen on CT scan - He has had liver ca in past and had resection - Appreciate oncology input     Protein-calorie malnutrition, severe (HCC) - Resume tube feeds - Appreciate IR checking if tube in right place     Thrombocytopenia ([place]) - Due to history of malignancy  - Platelets 81, stable     Chronic atrial fibrillation (HCC) - CHADS vasc score 5 - Not on AC due to risk of bleed - Rate controlled without BB    Diabetes mellitus type 2 without complications without long term insulin use - Continue linagliptin    DVT prophylaxis: SCD's bilaterally  Code Status: DNR/DNI Family Communication: daughter at the bedside this am Disposition Plan: home once stable likely in am or 9/6   Consultants:   IR  Oncology   Procedures:   None   Antimicrobials:   None     Subjective: No overnight events.    Objective: Vitals:   09/02/16 1405 09/02/16 2103 09/03/16 0505 09/03/16 1302  BP: (!) 153/64 (!) 187/67 (!) 157/57 117/61  Pulse: (!) 57 (!) 55 60 (!) 55  Resp: 16 16  16 16   Temp: 97.5 F (36.4 C) 98.8 F (37.1 C) 98.3 F (36.8 C) 99 F (37.2 C)  TempSrc: Oral Oral Oral Oral  SpO2: 90% 95% 97% 90%  Weight:      Height:        Intake/Output Summary (Last 24 hours) at 09/03/16 1419 Last data filed at 09/03/16 1301  Gross per 24 hour  Intake             1320 ml  Output             1200 ml  Net              120 ml   Filed Weights   08/31/16 1909 08/31/16 2346  Weight: 56.7 kg (125 lb) 55.4 kg (122 lb 2.2 oz)    Examination:  General exam: no acute distress  Respiratory system: No wheezing, no rhonchi  Cardiovascular system: S1 & S2 appreciated, RRR.  Gastrointestinal system: G tube (+), non tender  Central nervous system: No focal neurological deficits. Extremities: No swelling, palpable pulses  Skin: warm and dr, no lesions  Psychiatry: Normal mood and behavior   Data Reviewed: I have personally reviewed following labs and imaging studies  CBC:  Recent Labs Lab 08/31/16 2036 09/01/16 0415 09/01/16 1601 09/02/16 0426 09/02/16 1756 09/03/16 0527  WBC 9.8 9.9  --   --   --  7.5  HGB 17.4* 16.0 15.0 14.8 15.0 15.3  15.2  HCT 48.3 46.4 43.1 43.3 43.1 43.4  43.5  MCV 93.8 97.9  --   --   --  94.6  PLT 85* 74*  --   --   --  81*   Basic Metabolic Panel:  Recent Labs Lab 08/31/16 2036 09/01/16 0415  NA 142 142  K 3.5 3.9  CL 101 103  CO2 33* 31  GLUCOSE 163* 148*  BUN 30* 28*  CREATININE 0.89 0.87  CALCIUM 9.1 8.6*   GFR: Estimated Creatinine Clearance: 51.3 mL/min (by C-G formula based on SCr of 0.87 mg/dL). Liver Function Tests:  Recent Labs Lab 08/31/16 2036 09/01/16 0415  AST 37 37  ALT 18 17  ALKPHOS 88 84  BILITOT 1.9* 1.6*  PROT 7.5 7.2  ALBUMIN 3.8 3.4*   No results for input(s): LIPASE, AMYLASE in the last 168 hours. No results for input(s): AMMONIA in the last 168 hours. Coagulation Profile:  Recent Labs Lab 08/31/16 2041  INR 1.08   Cardiac Enzymes: No results for input(s): CKTOTAL,  CKMB, CKMBINDEX, TROPONINI in the last 168 hours. BNP (last 3 results) No results for input(s): PROBNP in the last 8760 hours. HbA1C: No results for input(s): HGBA1C in the last 72 hours. CBG:  Recent Labs Lab 09/02/16 2106 09/02/16 2350 09/03/16 0418 09/03/16 0732 09/03/16 1152  GLUCAP 72 80 111* 126* 102*   Lipid Profile: No results for input(s): CHOL, HDL, LDLCALC, TRIG, CHOLHDL, LDLDIRECT in the last 72 hours. Thyroid Function Tests: No results for input(s): TSH, T4TOTAL, FREET4, T3FREE, THYROIDAB in the last 72 hours. Anemia Panel: No results for input(s): VITAMINB12, FOLATE, FERRITIN, TIBC, IRON, RETICCTPCT in the last 72 hours. Urine analysis:    Component Value Date/Time   COLORURINE YELLOW 04/01/2015 1701   APPEARANCEUR CLEAR 04/01/2015 1701   LABSPEC 1.019 04/01/2015 1701   PHURINE 8.0 04/01/2015 1701   GLUCOSEU NEGATIVE 04/01/2015 1701   HGBUR NEGATIVE 04/01/2015 1701   BILIRUBINUR NEGATIVE 04/01/2015 1701   KETONESUR NEGATIVE 04/01/2015 1701   PROTEINUR NEGATIVE 04/01/2015 1701   UROBILINOGEN 1.0 04/01/2015 1701   NITRITE NEGATIVE 04/01/2015 1701   LEUKOCYTESUR TRACE (A) 04/01/2015 1701   Sepsis Labs: @LABRCNTIP (procalcitonin:4,lacticidven:4)   )No results found for this or any previous visit (from the past 240 hour(s)).    Radiology Studies: Ir Gj Tube Change Result Date: 08/30/2016 Status post exchange of occluded gastrojejunostomy, with placement of a new gastrojejunostomy, as above. Catheter ready for use.    Scheduled Meds: . baclofen  5 mg Per Tube TID  . cyproheptadine  4 mg Per Tube Daily  . insulin aspart  0-9 Units Subcutaneous Q4H  . levothyroxine  88 mcg Per Tube QAC breakfast  . linagliptin  5 mg Per Tube Daily  . mouth rinse  15 mL Mouth Rinse BID  .  morphine injection  4 mg Intravenous Once  . pantoprazole  40 mg Intravenous Q12H   Continuous Infusions: . dextrose 5 % and 0.45 % NaCl with KCl 20 mEq/L 75 mL/hr at 09/03/16 1216       LOS: 0 days    Time spent: 25 minutes  Greater than 50% of the time spent on counseling and coordinating the care.   Leisa Lenz, MD [facility] Pager [phone]  If 7PM-7AM, please contact night-coverage [email or web address] Password [email or web address] 09/03/2016, 2:19 PM

## 2016-09-03 NOTE — Progress Notes (Addendum)
Initial Nutrition Assessment  DOCUMENTATION CODES:   Severe malnutrition in context of chronic illness, Underweight  INTERVENTION:   Continue nocturnal feeds 9/4 from 1900-0700. Provide Jevity 1.2 @ 110 ml/hr over 12 hours via G-J tube  This regimen provides 1584 (100% of needs), 73 g protein, and 1065 ml H2O.  Recommend speech evaluation prior to diet advancement.  RD to continue to monitor  NUTRITION DIAGNOSIS:   Malnutrition related to chronic illness as evidenced by severe depletion of body fat, severe depletion of muscle mass.  GOAL:   Patient will meet greater than or equal to 90% of their needs  MONITOR:   Labs, Weight trends, TF tolerance, Skin, I & O's  REASON FOR ASSESSMENT:   Consult (verbal) Enteral/tube feeding initiation and management (per RN, MD wants to begin feeds)  ASSESSMENT:   82 y.o.malewith a past medical history significant for A. fib not on anticoagulation, COPD FEV1 44%, NIDDM, HTN, PCM and GJ tube, recurrent aspirationswho [facility]. He had no melena or hematochezia.  GJ tube had gotten clogged this week so he went to [facility] 1 day prior to the admission and this was accomplished uneventfully.  RN approached RD to request that feeds be resumed for this patient. With RN, was the patient's daughter who was able to give the patient's tube feeding nocturnal regimen of Jevity 1.2 at 100 ml/hr over 12 hours via G-J tube. Patient does drink liquids and states he eats soups when he can especially tomato and chicken noodle (which he does not eat the chicken chunks). Would recommend speech evaluation prior to diet advancement.  RD visited the pt. Found severe muscle and fat depletion. Pt has not been able to receive feeds for several days d/t G-J tube problems.  Per radiology note, CT scan from 9/3 shows the tube has migrated. The plan is to defer tube exchange as patient has had many tubes in the past. Last exchange performed 8/31.    Nutrition-Focused physical exam completed. Findings are severe fat depletion, severe muscle depletion, and no edema.   Labs reviewed. Medications: D5 and .45% NaCl w/ KCl infusion at 75 ml/hr -provides 306 kcal, Zofran PRN  Diet Order:  Diet NPO time specified Except for: Sips with Meds  Skin:  Reviewed, no issues  Last BM:  9/2  Height:   Ht Readings from Last 1 Encounters:  08/31/16 5\' 9"  (1.753 m)    Weight:   Wt Readings from Last 1 Encounters:  08/31/16 122 lb 2.2 oz (55.4 kg)    Ideal Body Weight:  72.7 kg  BMI:  Body mass index is 18.04 kg/m.  Estimated Nutritional Needs:   Kcal:  1500-1700  Protein:  70-80g  Fluid:  1.7L/day  EDUCATION NEEDS:   Education needs addressed  Clayton Bibles, MS, RD, LDN Pager: [phone] After Hours Pager: [phone]

## 2016-09-03 NOTE — Progress Notes (Signed)
  Request to evaluate G/J-tube due to leaking.  Tube feeds have not been started yet.   On initial exam, the flange/bumper was very loose.   Patient has chronic cough and apparently the bumper loosens with coughing.  I pushed the bumper down to the skin and secured the bumper to the tube with tape.  Patient tells me this will not work and he would like it exchanged.  He is well known to our service as he has had this tube exchanged many times, about once a month on the average (twice in July).  Most recent change was 8/31 by Dr. Vernard Gambles.  He is convinced that it needs to be changed again.   I spoke with a family member in the hallway and she tells me there is no convincing this patient that he does not need his tube exchanged again and that he will not be happy until it is.   I'm hoping I fixed the problem today and we can avoid another exchange.  We will recheck tube again tomorrow and see if the bumper remained in place with the tape securing it and hopefully hold off on exchange for now.  Ceazia Harb S Emrick Hensch PA-C 09/03/2016 10:05 AM    I spent a total of 25 Minutes at the the patient's bedside AND on the patient's hospital floor or unit, greater than 50% of which was counseling/coordinating care for G/J tube check.

## 2016-09-03 NOTE — Progress Notes (Signed)
Nursing Note: Pt 's peg tube dsg saturarted.Cleansed and changed.Skin more dunuded.

## 2016-09-03 NOTE — Progress Notes (Signed)
Nursing Note: Pt called for dsg change.G-tube site dsg saturated and skin @ site very red and denuded.wbb

## 2016-09-04 ENCOUNTER — Inpatient Hospital Stay (HOSPITAL_COMMUNITY): Payer: Medicare Other

## 2016-09-04 ENCOUNTER — Encounter: Payer: Self-pay | Admitting: *Deleted

## 2016-09-04 ENCOUNTER — Encounter (HOSPITAL_COMMUNITY): Payer: Self-pay | Admitting: Radiology

## 2016-09-04 HISTORY — PX: IR GENERIC HISTORICAL: IMG1180011

## 2016-09-04 LAB — GLUCOSE, CAPILLARY
GLUCOSE-CAPILLARY: 206 mg/dL — AB (ref 65–99)
Glucose-Capillary: 107 mg/dL — ABNORMAL HIGH (ref 65–99)
Glucose-Capillary: 125 mg/dL — ABNORMAL HIGH (ref 65–99)
Glucose-Capillary: 194 mg/dL — ABNORMAL HIGH (ref 65–99)
Glucose-Capillary: 196 mg/dL — ABNORMAL HIGH (ref 65–99)
Glucose-Capillary: 199 mg/dL — ABNORMAL HIGH (ref 65–99)

## 2016-09-04 LAB — HEMOGLOBIN AND HEMATOCRIT, BLOOD
HCT: 38.7 % — ABNORMAL LOW (ref 39.0–52.0)
Hemoglobin: 13.6 g/dL (ref 13.0–17.0)

## 2016-09-04 LAB — CBC
HEMATOCRIT: 36.8 % — AB (ref 39.0–52.0)
Hemoglobin: 13 g/dL (ref 13.0–17.0)
MCH: 32.9 pg (ref 26.0–34.0)
MCHC: 35.3 g/dL (ref 30.0–36.0)
MCV: 93.2 fL (ref 78.0–100.0)
PLATELETS: 76 10*3/uL — AB (ref 150–400)
RBC: 3.95 MIL/uL — ABNORMAL LOW (ref 4.22–5.81)
RDW: 12.9 % (ref 11.5–15.5)
WBC: 6.4 10*3/uL (ref 4.0–10.5)

## 2016-09-04 MED ORDER — IOPAMIDOL (ISOVUE-300) INJECTION 61%
50.0000 mL | Freq: Once | INTRAVENOUS | Status: AC | PRN
  Administered 2016-09-04: 10 mL via INTRAVENOUS

## 2016-09-04 NOTE — Consult Note (Signed)
[place]  [phone]) [phone]   HEMATOLOGY ONCOLOGY INPATIENT CONSULTATION   TERRON MERFELD  DOB: [birth year]  MR#: [id number]  CSN#: [id number]    Requesting Physician: [facility]  Patient Care Team: Conni Slipper, NP as PCP - General (Nurse Practitioner) Leonie Man, MD as Consulting Physician (Cardiology) Carol Ada, MD as Consulting Physician (Gastroenterology)  Reason for consult: liver lesions   History of present illness:   82 yo male with PMH of alcohol abuse, liver cancer s/p surgical resection at [facility] in 01/2011, atrial fibrillation, COPD, hypertension, diabetes, and GJ tube because of recurrent aspiration after his cervical spine injury. He presented to the hospital with upper GI bleeding. Workup including CT abdomen and pelvis without contrast showed 2 liver lesions, concerning for recurrent liver cancer.   He lives with his male friend, his daughter lives close to him. I met a post patient and his daughter in the hospital, he is able to take half most his ADLs, still drives, and ambulates with a crane.   MEDICAL HISTORY:  Past Medical History:  Diagnosis Date  . Back pain   . COPD (chronic obstructive pulmonary disease) (HCC)    [place]  . Coronary artery disease   . Dissecting aortic aneurysm, abdominal ([place]) 11/13/2011   Overview:  Severe aortoiliac stenosis with possible a chronic dissection versus  ulcerated plaque/thrombus extending above and below the aortic  bifurcation; without aneurysmal dilation  as per CT abdomen noted 09/25/2011   . DM2 (diabetes mellitus, type 2) ([place])    takes Tradjenta daily  . Dysphagia    s/p PEG placement  . Gordon (hepatocellular carcinoma) ([place])    s/p right lobe resection 2012  . Hyperlipidemia   . Hypertension   . Hypothyroidism    takes Synthroid daily   . Pacemaker 11/11/2015   St. Jude single chamber pacemaker implanted at [facility] 11/2014.  Marland Kitchen Paroxysmal atrial flutter (HCC)    and  fibrillation with known bradycardia, Chads2vasc of 4, not on A/C due to thrombocytopenia and functional status  . Peripheral vascular disease ([place])    LICA stent [id number]  . Pneumonia 11/03/2012   aspiration pneumonias  . Pneumothorax 10/2015  . Presence of permanent cardiac pacemaker   . Severe malnutrition ([place])   . Shortness of breath dyspnea    with exertion   . Thrombocytopenia ([place])     SURGICAL HISTORY: Past Surgical History:  Procedure Laterality Date  . CAROTID ANGIOGRAM  2012  . CAROTID STENT  2002  . cataract surgery      bilateral  . COLONOSCOPY    . DOPPLER ECHOCARDIOGRAPHY  2011  . ESOPHAGOGASTRODUODENOSCOPY  10/01/2012   Procedure: ESOPHAGOGASTRODUODENOSCOPY (EGD);  Surgeon: Gwenyth Ober, MD;  Location: [place];  Service: General;  Laterality: N/A;  wyatt/leone  . ESOPHAGOGASTRODUODENOSCOPY  10/31/2012   Procedure: ESOPHAGOGASTRODUODENOSCOPY (EGD);  Surgeon: Beryle Beams, MD;  Location: [facility] END[facility];  Service: Endoscopy;  Laterality: N/A;  . IR GENERIC HISTORICAL  07/25/2016   IR Thor DUODEN/JEJUNO TUBE PERCUT W/FLUORO 07/25/2016 Markus Daft, MD [facility]INTER[facility]  . IR GENERIC HISTORICAL  08/30/2016   IR GJ TUBE CHANGE 08/30/2016 Corrie Mckusick, DO [facility]INTER[facility]  . IR GENERIC HISTORICAL  09/04/2016   IR CM INJ ANY COLONIC TUBE W/FLUORO 09/04/2016 [facility]-INTERV RAD  . liver canc    . pacemaker inserted    . PARS PLANA VITRECTOMY Right 11/14/2015   Procedure: PARS PLANA VITRECTOMY WITH 25 GAUGE, WITH ENDOLASER;  Surgeon: Clent Demark  Rankin, MD;  Location: [place];  Service: Ophthalmology;  Laterality: Right;  . PEG PLACEMENT  10/01/2012   Procedure: PERCUTANEOUS ENDOSCOPIC GASTROSTOMY (PEG) PLACEMENT;  Surgeon: Gwenyth Ober, MD;  Location: [place];  Service: General;  Laterality: N/A;  . PEG TUBE PLACEMENT  10/12/2015      replacment tube  . POSTERIOR CERVICAL FUSION/FORAMINOTOMY  09/25/2012   Procedure: POSTERIOR CERVICAL FUSION/FORAMINOTOMY LEVEL 2;  Surgeon: Eustace Moore, MD;   Location: [place] NEURO ORS;  Service: Neurosurgery;  Laterality: N/A;  Posterior Cervical three-four laminectomy, posterior cervical three-four, four-five fusion    SOCIAL HISTORY: Social History   Social History  . Marital status: Widowed    Spouse name: N/A  . Number of children: 2  . Years of education: N/A   Occupational History  .  Retired   Social History Main Topics  . Smoking status: Former Smoker    Packs/day: 0.00    Types: Cigarettes  . Smokeless tobacco: Never Used     Comment: quit smoking about 5 yrs ago  . Alcohol use No     Comment: no alcohol in 2 yrs ago  . Drug use: No  . Sexual activity: Not on file   Other Topics Concern  . Not on file   Social History Narrative   Lives with male friend who is also a caretaker.      FAMILY HISTORY: Family History  Problem Relation Age of Onset  . Cancer Father     throat, died young  . Liver disease Brother   . CAD Neg Hx     ALLERGIES:  has No Known Allergies.  MEDICATIONS:  Current Facility-Administered Medications  Medication Dose Route Frequency Provider Last Rate Last Dose  . acetaminophen (TYLENOL) tablet 650 mg  650 mg Oral Q6H PRN Edwin Dada, MD       Or  . acetaminophen (TYLENOL) suppository 650 mg  650 mg Rectal Q6H PRN Edwin Dada, MD      . albuterol (PROVENTIL) (2.5 MG/3ML) 0.083% nebulizer solution 2.5 mg  2.5 mg Nebulization Q6H PRN Edwin Dada, MD      . baclofen (LIORESAL) tablet 5 mg  5 mg Per Tube TID Robbie Lis, MD   5 mg at 09/05/16 0959  . cyproheptadine (PERIACTIN) 4 MG tablet 4 mg  4 mg Per Tube Daily Edwin Dada, MD   4 mg at 09/05/16 0959  . dextromethorphan (DELSYM) 30 MG/5ML liquid 30 mg  30 mg Per Tube Q6H PRN Robbie Lis, MD   30 mg at 09/05/16 1000  . feeding supplement (JEVITY 1.2 CAL) liquid 1,000 mL  1,000 mL Per Tube Continuous Robbie Lis, MD   Stopped at 09/05/16 0700  . insulin aspart (novoLOG) injection 0-9 Units  0-9 Units  Subcutaneous Q4H Edwin Dada, MD   2 Units at 09/05/16 [phone]  . levothyroxine (SYNTHROID, LEVOTHROID) tablet 88 mcg  88 mcg Per Tube QAC breakfast Edwin Dada, MD   88 mcg at 09/05/16 0959  . linagliptin (TRADJENTA) tablet 5 mg  5 mg Per Tube Daily Robbie Lis, MD   5 mg at 09/05/16 1000  . MEDLINE mouth rinse  15 mL Mouth Rinse BID Robbie Lis, MD   15 mL at 09/05/16 1000  . morphine 2 MG/ML injection 2 mg  2 mg Intravenous Q3H PRN Rhetta Mura Schorr, NP      . morphine 4 MG/ML injection 4 mg  4 mg  Intravenous Once Jeryl Columbia, NP      . ondansetron [facility]) injection 4 mg  4 mg Intravenous Q6H PRN Edwin Dada, MD   4 mg at 09/05/16 0814  . oxyCODONE (Oxy IR/ROXICODONE) immediate release tablet 7.5-15 mg  7.5-15 mg Per Tube Q8H PRN Edwin Dada, MD   15 mg at 09/02/16 0105  . pantoprazole (PROTONIX) injection 40 mg  40 mg Intravenous Q12H Edwin Dada, MD   40 mg at 09/05/16 1000    REVIEW OF SYSTEMS:   Constitutional: Denies fevers, chills or abnormal night sweats Eyes: Denies blurriness of vision, double vision or watery eyes Ears, nose, mouth, throat, and face: Denies mucositis or sore throat Respiratory: Denies cough, dyspnea or wheezes Cardiovascular: Denies palpitation, chest discomfort or lower extremity swelling Gastrointestinal:  Denies nausea, heartburn or change in bowel habits Skin: Denies abnormal skin rashes Lymphatics: Denies new lymphadenopathy or easy bruising Neurological:Denies numbness, tingling or new weaknesses Behavioral/Psych: Mood is stable, no new changes  All other systems were reviewed with the patient and are negative.  PHYSICAL EXAMINATION: ECOG PERFORMANCE STATUS: 2 - Symptomatic, <50% confined to bed  Vitals:   09/05/16 0444 09/05/16 1450  BP: (!) 124/58 (!) 121/55  Pulse: (!) 50 (!) 52  Resp: 16 18  Temp: 98.6 F (37 C) 98.5 F (36.9 C)   Filed Weights   08/31/16 2346 09/04/16 0509  09/05/16 0444  Weight: 122 lb 2.2 oz (55.4 kg) 122 lb 9.2 oz (55.6 kg) 121 lb 0.5 oz (54.9 kg)    GENERAL:alert, no distress and comfortable SKIN: skin color, texture, turgor are normal, no rashes or significant lesions EYES: normal, conjunctiva are pink and non-injected, sclera clear OROPHARYNX:no exudate, no erythema and lips, buccal mucosa, and tongue normal  NECK: supple, thyroid normal size, non-tender, without nodularity LYMPH:  no palpable lymphadenopathy in the cervical, axillary or inguinal LUNGS: clear to auscultation and percussion with normal breathing effort HEART: regular rate & rhythm and no murmurs and no lower extremity edema ABDOMEN:abdomen soft, non-tender and normal bowel sounds Musculoskeletal:no cyanosis of digits and no clubbing  PSYCH: alert & oriented x 3 with fluent speech NEURO: no focal motor/sensory deficits  LABORATORY DATA:  I have reviewed the data as listed Lab Results  Component Value Date   WBC 5.4 09/05/2016   HGB 12.5 (L) 09/05/2016   HCT 35.6 (L) 09/05/2016   MCV 93.2 09/05/2016   PLT 69 (L) 09/05/2016    Recent Labs  08/31/16 2036 09/01/16 0415 09/05/16 0429  NA 142 142 135  K 3.5 3.9 3.9  CL 101 103 104  CO2 33* 31 26  GLUCOSE 163* 148* 193*  BUN 30* 28* 20  CREATININE 0.89 0.87 0.72  CALCIUM 9.1 8.6* 7.7*  GFRNONAA >60 >60 >60  GFRAA >60 >60 >60  PROT 7.5 7.2  --   ALBUMIN 3.8 3.4*  --   AST 37 37  --   ALT 18 17  --   ALKPHOS 88 84  --   BILITOT 1.9* 1.6*  --     RADIOGRAPHIC STUDIES: I have personally reviewed the radiological images as listed and agreed with the findings in the report. Ct Abdomen Pelvis Wo Contrast  Result Date: 09/02/2016 CLINICAL DATA:  Evaluate placement of gastro jejunostomy tube. Clinical concern regarding possible placement of the tip too distal in the bowel. EXAM: CT ABDOMEN AND PELVIS WITHOUT CONTRAST TECHNIQUE: Multidetector CT imaging of the abdomen and pelvis was performed following the  standard  protocol without IV contrast. COMPARISON:  08/30/2016 and earlier exams including CT of the abdomen and pelvis on 06/20/2015 FINDINGS: Lower chest: Fibrotic and emphysematous changes are identified at the lung bases. Heart size is normal. No significant coronary artery calcifications. Hepatobiliary: Status post partial liver resection. Low-attenuation lesion is identified within the left hepatic lobe, measuring 2.3 x 1.9 cm. Previously this measured 1.9 x 1.4 cm. A second lesion is also identified in the left hepatic lobe measuring 2.2 x 2.0 cm. Pancreas: Pancreas is atrophic.  No focal mass. Spleen: Normal noncontrast appearance. Renal/Adrenal: Adrenal glands are normal in appearance. Cyst in the lower pole of the left kidney. Cyst in the upper pole the left kidney. Bilateral intrarenal calcifications are identified, not associated with obstruction. These calcifications are consistent with vascular calcifications or calculi and measure up to 5 mm in diameter. Gastrointestinal tract: Patient has a gastrojejunostomy tube. The balloon portion is in the gastric antrum. The distal portion extends into the duodenum. A coil is identified within the horizontal portion of the duodenum but does not appear to be knotted. The tube continues inferiorly into the proximal jejunum in the right lower quadrant. 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 The ligament of Treitz is lower in position and just to the left of midline. Small bowel loops are normal in appearance. The appendix is well seen and has a normal appearance. Reproductive/Pelvis: Prostatic calcifications are present. Urinary bladder has a normal appearance. Seminal vesicles are normal in appearance. There is no free pelvic fluid. Vascular/Lymphatic: There is dense atherosclerotic calcification of the abdominal aorta. No retroperitoneal or mesenteric adenopathy. Musculoskeletal/Abdominal wall: There is a wedge compression fracture of L2, new since the prior study. The  contour favors subacute or chronic injury. No retropulsion of fracture fragments identified. Other: none IMPRESSION: 1. Gastrojejunostomy position with the jejunum, approximately 20 cm beyond the ligament of Treitz. The ligament of Treitz is lower than expected and just to left of midline secondary to partial malrotation. Tube is coiled within the horizontal portion of the duodenum does not appear to be knotted. 2. Fibrotic and emphysematous changes at the lung bases. 3. Status post partial liver resection. 4. **An incidental finding of potential clinical significance has been found. Two lesions within the left hepatic lobe are suspicious for metastases. One appears larger and the other is new. Correlation with patient's prior history is needed. Consider further characterization of these lesions as indicated. ** 5. Renal cysts. 6. Intrarenal calcifications. 7. Prostatic calcifications. 8.  Aortic atherosclerosis. 9. Interval wedge compression fracture of L2 favored to be subacute or chronic. These results will be called to the ordering clinician or representative by the Radiologist Assistant, and communication documented in the PACS or zVision Dashboard. Electronically Signed   By: Nolon Nations M.D.   On: 09/02/2016 16:35   Ct Chest Wo Contrast  Result Date: 09/04/2016 CLINICAL DATA:  History of liver carcinoma with cough and hemoptysis for several days EXAM: CT CHEST WITHOUT CONTRAST TECHNIQUE: Multidetector CT imaging of the chest was performed following the standard protocol without IV contrast. COMPARISON:  06/20/2015 FINDINGS: Cardiovascular: Somewhat limited by the lack of IV contrast. Diffuse vascular calcifications are noted without aneurysmal dilatation of the aorta. Pacing device is noted. No significant Coronary calcifications are seen. Mediastinum/Nodes: No hilar or mediastinal adenopathy is identified. The visualized esophagus is within normal limits. No axillary adenopathy is seen. Lungs/Pleura:  The lungs are well aerated bilaterally and demonstrate some emphysematous changes. Some areas of scarring are noted particularly in the left  lung base. Stable nodule is noted within the right upper lobe measuring 5 mm. A small sub solid nodule is noted in the right upper lobe (image 29 of series 5) which in retrospect is stable from the prior exam also measuring approximately 5 mm in dimension. Upper Abdomen: The liver again demonstrates changes of right hepatectomy. There are 2 lateral segment left lobe lesions which are consistent with recurrent neoplasm. Few nonobstructing stones are noted in the right kidney. The largest of these measures 7 mm in greatest dimension. Musculoskeletal: Degenerative changes of the thoracic spine are noted. IMPRESSION: Changes consistent with recurrent hepatic neoplasm in the left lobe. Stable appearing nodules in the right lung as described. Emphysematous changes. No new focal abnormality is seen. Electronically Signed   By: Inez Catalina M.D.   On: 09/04/2016 14:46   Ir Gj Tube Change  Result Date: 08/30/2016 INDICATION: 82 year old male with a history of dysphagia. His gastrojejunostomy tube has become occluded. EXAM: JEJUNAL CATHETER REPLACEMENT MEDICATIONS: None ANESTHESIA/SEDATION: None CONTRAST:  20 cc - administered into the intestinal lumen. FLUOROSCOPY TIME:  Fluoroscopy Time: 2 minutes 54 seconds (4.9 mGy). COMPLICATIONS: None immediate. PROCEDURE: Informed written consent was obtained from the patient after a thorough discussion of the procedural risks, benefits and alternatives. All questions were addressed. Maximal Sterile Barrier Technique was utilized including caps, mask, sterile gowns, sterile gloves, sterile drape, hand hygiene and skin antiseptic. A timeout was performed prior to the initiation of the procedure. Patient is position on the table in the supine position. Scout image was acquired. Initial attempt to pass a stiff Glidewire through the lumen of the  tube was unsuccessful. Balloon was then deflated, and the gastrojejunostomy was partially withdrawn from the intestine. The jejunostomy catheter was then ligated approximately 6 inches from the skin ostomy. In order to maintain the access into the jejunum in a post pyloric location, a 66 French peel-away sheath was advanced over the jejunostomy tube and into a location that was post pyloric. The Glidewire was then inserted into the lumen of the peel-away sheath, between the walls of the peel-away sheath and the occluded jejunal catheter. Once the Glidewire was post pyloric, the jejunal tube and sheath were removed. Angled [place] the catheter was then advanced over the Glidewire to navigate the Glidewire further into the jejunum for better purchase. Catheter was then removed from the [place]. A new gastrojejunostomy was placed over the Glidewire with adequate lubrication. Glidewire was removed. Balloon was inflated with approximately 8 cc of saline. Contrast infused into the catheter confirmed location. Patient tolerated the procedure well and remained hemodynamically stable throughout. No complications were encountered and no significant blood loss encountered. IMPRESSION: Status post exchange of occluded gastrojejunostomy, with placement of a new gastrojejunostomy, as above. Catheter ready for use. Signed, Dulcy Fanny. Earleen Newport, DO [facility] Radiology Electronically Signed   By: Corrie Mckusick D.O.   On: 08/30/2016 14:59   Ir Cm Inj Any Colonic Tube W/fluoro  Result Date: 09/04/2016 CLINICAL DATA:  Leaking around dual-lumen gastrojejunostomy catheter. Recent CT demonstrates retention balloon may be beyond the pylorus. EXAM: GASTROJEJUNOSTOMY CATHETER INJECTION UNDER FLUOROSCOPY TECHNIQUE: The procedure, risks (including but not limited to bleeding, infection, organ damage ), benefits, and alternatives were explained to the patient. Questions regarding the procedure  were encouraged and answered. The patient understands and consents to the procedure. Survey fluoroscopic inspection reveals stable position of the gastrojejunostomy catheter. Injection demonstrates patent jejunal lumen, tip beyond the ligament of Treitz. The retention  balloon is beyond the pylorus, and injection into the gastric lumen opacifies the duodenal lumen. The retention balloon was deflated and retracted into the gastric lumen, then reinflated to 10 mL and brought up against the anterior gastric wall. The external bumper was applied. Injection of gastrostomy lumen demonstrates contrast flow into the lumen of the decompressed stomach. The patient tolerated the procedure well. . IMPRESSION: 1. Repositioning of gastrojejunostomy catheter retention balloon. Okay for routine use. Electronically Signed   By: Lucrezia Europe M.D.   On: 09/04/2016 17:09   MR abdomen w wo contrast 12/11/2010 IMPRESSION:   1.  Irregular enhancing mass within the dome of the right hepatic lobe has imaging characteristics most consistent with a malignant neoplasm.  Favor hepatocellular carcinoma  with secondary differential including adenoma,  cholangiocarcinoma, and metastasis.  Recommend ultrasound-guided or CT guided percutaneous biopsy. 2.  No evidence of abdominal lymphadenopathy. 3.  Nonenhancing cysts  in the left kidney with cortical scarring associated with the lower pole cyst.    ASSESSMENT & PLAN:  82 year old male,  82 yo male with PMH of alcohol abuse, liver cancer s/p surgical resection at [facility] in 01/2011, atrial fibrillation, COPD, hypertension, diabetes, and GJ tube because of recurrent aspiration after his cervical spine injury. He presented to the hospital with upper GI bleeding. Workup including CT abdomen and pelvis without contrast showed 2 liver lesions, concerning for recurrent liver cancer.   1. Liver lesions, rule out recurrent HCC -His CT abdomen from 06/21/2015 showed a 1.9cm low attenuation  lesion within the left lobe of liver, this has increased to 2.3 cm on his recent CT scan from 09/02/2016, and a second new lesion in the left lobe measuring 2.2 cm. I have reviewed his CT scans, this liver lesions are suspicious for recurrent liver cancer.  -His AFP from a few days ago was normal  -He has pacemaker, not able to undergo MRI scan. I recommend a CT abdomen with and without contrast  (liver protocol ) to further evaluate this to liver lesions.  -I'll present his case in our GI tumor Board, to see if we need biopsy of the liver lesions  -I discussed possible treatment options for recurrent liver cancer, given his advanced age and comorbidities, he would not be a candidate for surgery or transplant. I will likely recommend ablation by interventional radiology  -Metastatic malignancy is also possible, we'll decide if he needs biopsy after his CT scan.  -I will follow him in our clinic in the next few weeks.   Recommendations: -CT abdomen with and without contrast, liver protocol, to further evaluate the 2 liver lesions  -I'll see him back in my clinic in a few weeks.    All questions were answered. The patient knows to call the clinic with any problems, questions or concerns.      Samuel Merle, MD 09/05/2016 4:10 PM

## 2016-09-04 NOTE — Progress Notes (Signed)
Made aware of referral to medical oncology by Dr. Benay Spice (per Dr. Lindi Adie). Forward information to Dr. Burr Medico, who will see patient in the hospital.

## 2016-09-04 NOTE — Progress Notes (Signed)
Patient ID: [name], male   DOB: 1931/06/20, 82 y.o.   MRN: [id number]    Referring Physician(s): Dr. Leisa Lenz  Supervising Physician: Arne Cleveland  Patient Status: inpt  Chief Complaint: Leaking GJ tube/liver lesions  Subjective: Patient seen yesterday and had phalange tightened on the skin.  The GJ tube is still leaking today.  Patient is adamant that an exchange is going to be the only thing that fixes the leaking.  Allergies: Review of patient's allergies indicates no known allergies.  Medications: Prior to Admission medications   Medication Sig Start Date End Date Taking? Authorizing Provider  albuterol (PROVENTIL) (2.5 MG/3ML) 0.083% nebulizer solution Take 3 mLs (2.5 mg total) by nebulization every 6 (six) hours as needed for wheezing or shortness of breath. 09/12/14  Yes Oswald Hillock, MD  aspirin EC 81 MG tablet Place 81 mg into feeding tube daily.    Yes Historical Provider, MD  budesonide (PULMICORT) 0.25 MG/2ML nebulizer solution Take 0.25 mg by nebulization every 6 (six) hours.  05/24/15  Yes Historical Provider, MD  cyproheptadine (PERIACTIN) 4 MG tablet Place 4 mg into feeding tube daily.  05/24/15  Yes Historical Provider, MD  ergocalciferol (VITAMIN D2) 50000 units capsule Take 50,000 Units by mouth once a week.   Yes Historical Provider, MD  levothyroxine (SYNTHROID, LEVOTHROID) 88 MCG tablet Take 88 mcg by mouth daily.   Yes Historical Provider, MD  linagliptin (TRADJENTA) 5 MG TABS tablet Place 5 mg into feeding tube daily.    Yes Historical Provider, MD  Nutritional Supplements (FEEDING SUPPLEMENT, JEVITY 1.5 CAL,) LIQD Place 1,100 mLs into feeding tube continuous. At bedtime at a rate of 163ml/hr.   Yes Historical Provider, MD  oxyCODONE (ROXICODONE) 15 MG immediate release tablet Place 7.5-15 mg into feeding tube See admin instructions. Take 1/2 - 1 tablet by feeding tube twice daily (depending on pain level), may take an additional 1/2 -1 tablet at noon  if needed for pain 09/09/15  Yes Historical Provider, MD  polyethylene glycol (MIRALAX / GLYCOLAX) packet Place 8.5 g into feeding tube 2 (two) times daily.  11/09/15  Yes Historical Provider, MD  tiotropium (SPIRIVA) 18 MCG inhalation capsule Place 18 mcg into inhaler and inhale 2 (two) times daily.   Yes Historical Provider, MD    Vital Signs: BP (!) 111/54 (BP Location: Right Arm)   Pulse 69   Temp 98 F (36.7 C) (Oral)   Resp 14   Ht 5\' 9"  (1.753 m)   Wt 122 lb 9.2 oz (55.6 kg)   SpO2 96%   BMI 18.10 kg/m   Physical Exam: Abd: soft, GJ tube in place with some bilious leakage.  Phalange is tight to the skin.  Some excoriation around the [place] tube site.  Imaging: Ct Abdomen Pelvis Wo Contrast  Result Date: 09/02/2016 CLINICAL DATA:  Evaluate placement of gastro jejunostomy tube. Clinical concern regarding possible placement of the tip too distal in the bowel. EXAM: CT ABDOMEN AND PELVIS WITHOUT CONTRAST TECHNIQUE: Multidetector CT imaging of the abdomen and pelvis was performed following the standard protocol without IV contrast. COMPARISON:  08/30/2016 and earlier exams including CT of the abdomen and pelvis on 06/20/2015 FINDINGS: Lower chest: Fibrotic and emphysematous changes are identified at the lung bases. Heart size is normal. No significant coronary artery calcifications. Hepatobiliary: Status post partial liver resection. Low-attenuation lesion is identified within the left hepatic lobe, measuring 2.3 x 1.9 cm. Previously this measured 1.9 x 1.4 cm. A second lesion is  also identified in the left hepatic lobe measuring 2.2 x 2.0 cm. Pancreas: Pancreas is atrophic.  No focal mass. Spleen: Normal noncontrast appearance. Renal/Adrenal: Adrenal glands are normal in appearance. Cyst in the lower pole of the left kidney. Cyst in the upper pole the left kidney. Bilateral intrarenal calcifications are identified, not associated with obstruction. These calcifications are consistent with vascular  calcifications or calculi and measure up to 5 mm in diameter. Gastrointestinal tract: Patient has a gastrojejunostomy tube. The balloon portion is in the gastric antrum. The distal portion extends into the duodenum. A coil is identified within the horizontal portion of the duodenum but does not appear to be knotted. The tube continues inferiorly into the proximal jejunum in the right lower quadrant. 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 The ligament of Treitz is lower in position and just to the left of midline. Small bowel loops are normal in appearance. The appendix is well seen and has a normal appearance. Reproductive/Pelvis: Prostatic calcifications are present. Urinary bladder has a normal appearance. Seminal vesicles are normal in appearance. There is no free pelvic fluid. Vascular/Lymphatic: There is dense atherosclerotic calcification of the abdominal aorta. No retroperitoneal or mesenteric adenopathy. Musculoskeletal/Abdominal wall: There is a wedge compression fracture of L2, new since the prior study. The contour favors subacute or chronic injury. No retropulsion of fracture fragments identified. Other: none IMPRESSION: 1. Gastrojejunostomy position with the jejunum, approximately 20 cm beyond the ligament of Treitz. The ligament of Treitz is lower than expected and just to left of midline secondary to partial malrotation. Tube is coiled within the horizontal portion of the duodenum does not appear to be knotted. 2. Fibrotic and emphysematous changes at the lung bases. 3. Status post partial liver resection. 4. **An incidental finding of potential clinical significance has been found. Two lesions within the left hepatic lobe are suspicious for metastases. One appears larger and the other is new. Correlation with patient's prior history is needed. Consider further characterization of these lesions as indicated. ** 5. Renal cysts. 6. Intrarenal calcifications. 7. Prostatic calcifications. 8.  Aortic  atherosclerosis. 9. Interval wedge compression fracture of L2 favored to be subacute or chronic. These results will be called to the ordering clinician or representative by the Radiologist Assistant, and communication documented in the PACS or zVision Dashboard. Electronically Signed   By: Nolon Nations M.D.   On: 09/02/2016 16:35    Labs:  CBC:  Recent Labs  08/31/16 2036 09/01/16 0415  09/02/16 1756 09/03/16 0527 09/03/16 1611 09/04/16 0415  WBC 9.8 9.9  --   --  7.5  --  6.4  HGB 17.4* 16.0  < > 15.0 15.3  15.2 14.5 13.0  HCT 48.3 46.4  < > 43.1 43.4  43.5 40.6 36.8*  PLT 85* 74*  --   --  81*  --  76*  < > = values in this interval not displayed.  COAGS:  Recent Labs  08/31/16 2041  INR 1.08    BMP:  Recent Labs  10/11/15 1054 11/14/15 1215 08/31/16 2036 09/01/16 0415  NA 134* 138 142 142  K 4.0 4.3 3.5 3.9  CL 100* 98* 101 103  CO2 28 32 33* 31  GLUCOSE 219* 144* 163* 148*  BUN 23* 27* 30* 28*  CALCIUM 8.4* 9.2 9.1 8.6*  CREATININE 0.71 0.77 0.89 0.87  GFRNONAA >60 >60 >60 >60  GFRAA >60 >60 >60 >60    LIVER FUNCTION TESTS:  Recent Labs  08/31/16 2036 09/01/16 0415  BILITOT 1.9*  1.6*  AST 37 37  ALT 18 17  ALKPHOS 88 84  PROT 7.5 7.2  ALBUMIN 3.8 3.4*    Assessment and Plan: 1. Leaking GJ tube -will need to review imaging with Dr. Vernard Gambles.  This was just exchanged last Thursday.  The patient is convinced his tube "is too far down."  He is also upset that he coughed up a little bit of blood after the procedure.  I explained that is not uncommon to have some trauma to his stomach after this type of exchange.  He states that he has never had this before and is still under the impression that some was done incorrectly.  He has a 50F GJ tube in place.  Many times these leak give the chronicity of the tube due to dilatation of the tract.  He states this has never leaked before this last exchange, but in review of prior notes, it has leaked before  which has resulted in gradual upsizing of his tube.  He is currently at a 50F.  I have recommended barrier cream to be used around the [place] tube site to decrease the amount of skin irritation.  Keep the site as clean and dry as possible.  Will d/w Dr. Vernard Gambles. 2. Liver lesions -the patient has a history of liver cancer.  He has 2 new liver lesions.  He has an AFP that is pending.  We would not recommend biopsy at this time, yet an MRI of the liver, which could be done as an outpatient if needed.  Many times the AFP in combination with the liver MRI is enough to characterize the lesions for diagnosis without needing a biopsy.  If these were inconclusive, then a liver biopsy could be considered as an outpatient.  ADDENDUM: Discussed imaging with Dr. Vernard Gambles.  His balloon is a little far down.  We will plan to reposition his GJ tube today in IR.  Electronically Signed: Henreitta Cea 09/04/2016, 1:15 PM   I spent a total of 25 Minutes at the the patient's bedside AND on the patient's hospital floor or unit, greater than 50% of which was counseling/coordinating care for leaking GJ tube, liver lesions

## 2016-09-04 NOTE — Progress Notes (Signed)
Nutrition Brief Follow-up   Patient tolerating nocturnal feeds. Still c/o nausea which was a complaint since admission. Pt states he keeps "spitting up" and what he spits up is white in color, no tube feed formula present.  Pt's tube continues to leak and may need to be exchanged per MD note. Will monitor for plan.  Continue tube feeding regimen as medically appropriate. Jevity 1.2 @ 110 ml/hr over 12 hours(1900-0700) via G-J tube  This regimen provides 1584 (100% of needs), 73 g protein, and 1065 ml H2O.  Clayton Bibles, MS, RD, LDN Pager: [phone] After Hours Pager: [phone]
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
## 2016-09-05 ENCOUNTER — Encounter (HOSPITAL_COMMUNITY): Payer: Self-pay | Admitting: Radiology

## 2016-09-05 ENCOUNTER — Inpatient Hospital Stay (HOSPITAL_COMMUNITY): Payer: Medicare Other

## 2016-09-05 DIAGNOSIS — K769 Liver disease, unspecified: Secondary | ICD-10-CM

## 2016-09-05 DIAGNOSIS — Z8505 Personal history of malignant neoplasm of liver: Secondary | ICD-10-CM

## 2016-09-05 DIAGNOSIS — R160 Hepatomegaly, not elsewhere classified: Secondary | ICD-10-CM

## 2016-09-05 LAB — CBC
HEMATOCRIT: 35.6 % — AB (ref 39.0–52.0)
HEMOGLOBIN: 12.5 g/dL — AB (ref 13.0–17.0)
MCH: 32.7 pg (ref 26.0–34.0)
MCHC: 35.1 g/dL (ref 30.0–36.0)
MCV: 93.2 fL (ref 78.0–100.0)
Platelets: 69 10*3/uL — ABNORMAL LOW (ref 150–400)
RBC: 3.82 MIL/uL — ABNORMAL LOW (ref 4.22–5.81)
RDW: 13.1 % (ref 11.5–15.5)
WBC: 5.4 10*3/uL (ref 4.0–10.5)

## 2016-09-05 LAB — GLUCOSE, CAPILLARY
GLUCOSE-CAPILLARY: 119 mg/dL — AB (ref 65–99)
GLUCOSE-CAPILLARY: 184 mg/dL — AB (ref 65–99)
GLUCOSE-CAPILLARY: 198 mg/dL — AB (ref 65–99)
Glucose-Capillary: 98 mg/dL (ref 65–99)
Glucose-Capillary: 88 mg/dL (ref 65–99)

## 2016-09-05 LAB — BASIC METABOLIC PANEL
Anion gap: 5 (ref 5–15)
BUN: 20 mg/dL (ref 6–20)
CHLORIDE: 104 mmol/L (ref 101–111)
CO2: 26 mmol/L (ref 22–32)
CREATININE: 0.72 mg/dL (ref 0.61–1.24)
Calcium: 7.7 mg/dL — ABNORMAL LOW (ref 8.9–10.3)
GFR calc non Af Amer: >60 mL/min (ref >60)
GLUCOSE: 193 mg/dL — AB (ref 65–99)
Potassium: 3.9 mmol/L (ref 3.5–5.1)
Sodium: 135 mmol/L (ref 135–145)

## 2016-09-05 LAB — AFP TUMOR MARKER: AFP-Tumor Marker: 3.5 ng/mL (ref 0.0–8.3)

## 2016-09-05 LAB — HEMOGLOBIN AND HEMATOCRIT, BLOOD
HEMATOCRIT: 40.4 % (ref 39.0–52.0)
Hemoglobin: 14.5 g/dL (ref 13.0–17.0)

## 2016-09-05 MED ORDER — IOPAMIDOL (ISOVUE-300) INJECTION 61%
100.0000 mL | Freq: Once | INTRAVENOUS | Status: AC | PRN
  Administered 2016-09-05: 100 mL via INTRAVENOUS

## 2016-09-05 NOTE — Progress Notes (Signed)
Nutrition Follow-up  DOCUMENTATION CODES:   Severe malnutrition in context of chronic illness, Underweight  INTERVENTION:   Continue nocturnal feeds regimen: Jevity 1.2 @ 110 ml/hr over 12 hours(1900-0700) via G-J tubeThis regimen provides 1584 (100% of needs), 73 g protein, and 1065 ml H2O.  RD to continue to monitor  NUTRITION DIAGNOSIS:   Malnutrition related to chronic illness as evidenced by severe depletion of body fat, severe depletion of muscle mass.  Ongoing.  GOAL:   Patient will meet greater than or equal to 90% of their needs  Meeting with TF.  MONITOR:   Labs, Weight trends, TF tolerance, Skin, I & O's  ASSESSMENT:   82 y.o.malewith a past medical history significant for A. fib not on anticoagulation, COPD FEV1 44%, NIDDM, HTN, PCM and GJ tube, recurrent aspirationswho [facility]. He had no melena or hematochezia.  GJ tube had gotten clogged this week so he went to [facility] 1 day prior to the admission and this was accomplished uneventfully.  Patient had G-J tube repositioned 9/5. No longer leaking. Tolerated his TF last night at goal rate. Pt continues to be NPO.  Per interdisciplinary rounds, pt may discharge today.  Medications: D5 and .45% NaCl w/ KCl infusion at 75 ml/hr -provides 306 kcal, Zofran PRN Labs reviewed: CBGs: 119-198  Diet Order:  Diet NPO time specified Except for: Sips with Meds  Skin:  Reviewed, no issues  Last BM:  9/5  Height:   Ht Readings from Last 1 Encounters:  08/31/16 5\' 9"  (1.753 m)    Weight:   Wt Readings from Last 1 Encounters:  09/05/16 121 lb 0.5 oz (54.9 kg)    Ideal Body Weight:  72.7 kg  BMI:  Body mass index is 17.87 kg/m.  Estimated Nutritional Needs:   Kcal:  1500-1700  Protein:  70-80g  Fluid:  1.7L/day  EDUCATION NEEDS:   Education needs addressed  Clayton Bibles, MS, RD, LDN Pager: [phone] After Hours Pager: [phone]

## 2016-09-05 NOTE — Progress Notes (Signed)
Nursing Discharge Summary  Patient ID: [name] MRN: [id number] DOB/AGE: 08/06/1931 82 y.o.  Admit date: 08/31/2016 Discharge date: 09/05/2016  Discharged Condition: good  Disposition: 01-Home or Self Care  Follow-up Information    ANTHONY,ARENNETTE, NP Follow up in 1 week(s).   Specialty:  Nurse Practitioner Contact information: [address]  [place] [address] [phone]        Truitt Merle, MD .   Specialties:  Hematology, Oncology Why:  will call with appointment Contact information: [place] [address] [phone]        [place] .   Contact information: [address] [place] [address] [phone]           Prescriptions Given: No new medications.  Patient follow up appointments discussed.  Patient and daughter verbalized understanding without further questions.    Means of Discharge: Patient taken downstairs via wheelchair to be discharged home via private vehicle.    Signed: Buel Ream 09/05/2016, 5:46 PM

## 2016-09-05 NOTE — Discharge Summary (Signed)
Physician Discharge Summary  [name] DOB: 1931-11-04 DOA: 08/31/2016  PCP: Conni Slipper, NP  Admit date: 08/31/2016 Discharge date: 09/05/2016   Recommendations for Outpatient Follow-Up:   1. Home health 2. Outpatient oncology follow up   Discharge Diagnosis:   Principal Problem:   Hematemesis Active Problems:   Hypertension   Hypothyroid   Copd Golds C   Protein-calorie malnutrition, severe (HCC)   Thrombocytopenia (HCC)   Chronic atrial fibrillation ([place])   Hyperbilirubinemia   Discharge disposition:  Home.   Discharge Condition: Improved.  Diet recommendation: tube feeds  Wound care: None.   History of Present Illness:   [name] is a 82 y.o. male with a past medical history significant for A. fib not on anticoagulation, COPD FEV1 44%, NIDDM, HTN, PCM, and GJ tube because of recurrent aspirations who presents with hematemesis.  The patient's Welsh tube had gotten clogged this week, so he went to [facility] yesterday (Thursday) to have it exchanged under radiographic guidance by Dr. Earleen Newport. Per notes, this was accomplished uneventfully.  Today, he had a few episodes of coffee-ground emesis, and so he came to the ER to get checked out. He had no melena, hematochezia. He denies NSAID use. He is not on an anticoagulant.   Hospital Course by Problem:   Hematemesis - Likely from manipulation of GJ tube - No further hemoptysis - Hemoglobin stable      Hypothyroid - Continue synthroid 88 mcg daily     Liver lesions - Liver lesions seen on CT scan - He has had liver ca in past and had resection - Spoke with Dr. Burr Medico- since unable to have MRI due to pacemaker, get CT scan with contrast, liver protocol-- done before d/c    Protein-calorie malnutrition, severe ([place]) - tube replaced and not leaking    Thrombocytopenia ([place]) - Due to history of malignancy  - Platelets 81, 76 - Stable overall     Chronic atrial fibrillation  (HCC) - CHADS vasc score 5 - Not on AC due to risk of bleed - Rate controlled without BB    Diabetes mellitus type 2 without complications without long term insulin use - Continue linagliptin     Medical Consultants:    IR  oncology   Discharge Exam:   Vitals:   09/04/16 2100 09/05/16 0444  BP: 119/66 (!) 124/58  Pulse: (!) 52 (!) 50  Resp: 16 16  Temp: 98.3 F (36.8 C) 98.6 F (37 C)   Vitals:   09/04/16 0509 09/04/16 1317 09/04/16 2100 09/05/16 0444  BP: (!) 111/54 (!) 149/70 119/66 (!) 124/58  Pulse: 69 63 (!) 52 (!) 50  Resp: 14 15 16 16   Temp: 98 F (36.7 C) 99.7 F (37.6 C) 98.3 F (36.8 C) 98.6 F (37 C)  TempSrc: Oral Oral Oral Oral  SpO2: 96% 99% 95% 97%  Weight: 55.6 kg (122 lb 9.2 oz)   54.9 kg (121 lb 0.5 oz)  Height:        Gen:  NAD    The results of significant diagnostics from this hospitalization (including imaging, microbiology, ancillary and laboratory) are listed below for reference.     Procedures and Diagnostic Studies:   No results found.   Labs:   Basic Metabolic Panel:  Recent Labs Lab 08/31/16 2036 09/01/16 0415 09/05/16 0429  NA 142 142 135  K 3.5 3.9 3.9  CL 101 103 104  CO2 33* 31 26  GLUCOSE 163* 148* 193*  BUN  30* 28* 20  CREATININE 0.89 0.87 0.72  CALCIUM 9.1 8.6* 7.7*   GFR Estimated Creatinine Clearance: 55.3 mL/min (by C-G formula based on SCr of 0.8 mg/dL). Liver Function Tests:  Recent Labs Lab 08/31/16 2036 09/01/16 0415  AST 37 37  ALT 18 17  ALKPHOS 88 84  BILITOT 1.9* 1.6*  PROT 7.5 7.2  ALBUMIN 3.8 3.4*   No results for input(s): LIPASE, AMYLASE in the last 168 hours. No results for input(s): AMMONIA in the last 168 hours. Coagulation profile  Recent Labs Lab 08/31/16 2041  INR 1.08    CBC:  Recent Labs Lab 08/31/16 2036 09/01/16 0415  09/03/16 0527 09/03/16 1611 09/04/16 0415 09/04/16 1552 09/05/16 0429  WBC 9.8 9.9  --  7.5  --  6.4  --  5.4  HGB 17.4* 16.0  < >  15.3  15.2 14.5 13.0 13.6 12.5*  HCT 48.3 46.4  < > 43.4  43.5 40.6 36.8* 38.7* 35.6*  MCV 93.8 97.9  --  94.6  --  93.2  --  93.2  PLT 85* 74*  --  81*  --  76*  --  69*  < > = values in this interval not displayed. Cardiac Enzymes: No results for input(s): CKTOTAL, CKMB, CKMBINDEX, TROPONINI in the last 168 hours. BNP: Invalid input(s): POCBNP CBG:  Recent Labs Lab 09/04/16 2025 09/05/16 0039 09/05/16 0433 09/05/16 0805 09/05/16 1212  GLUCAP 196* 184* 198* 119* 98   D-Dimer No results for input(s): DDIMER in the last 72 hours. Hgb A1c No results for input(s): HGBA1C in the last 72 hours. Lipid Profile No results for input(s): CHOL, HDL, LDLCALC, TRIG, CHOLHDL, LDLDIRECT in the last 72 hours. Thyroid function studies No results for input(s): TSH, T4TOTAL, T3FREE, THYROIDAB in the last 72 hours.  Invalid input(s): FREET3 Anemia work up No results for input(s): VITAMINB12, FOLATE, FERRITIN, TIBC, IRON, RETICCTPCT in the last 72 hours. Microbiology No results found for this or any previous visit (from the past 240 hour(s)).   Discharge Instructions:   Discharge Instructions    Discharge instructions    Complete by:  As directed   With home health Outpatient oncology follow up Resume tube feeds   Increase activity slowly    Complete by:  As directed       Medication List    TAKE these medications   albuterol (2.5 MG/3ML) 0.083% nebulizer solution Commonly known as:  PROVENTIL Take 3 mLs (2.5 mg total) by nebulization every 6 (six) hours as needed for wheezing or shortness of breath.   aspirin EC 81 MG tablet Place 81 mg into feeding tube daily.   budesonide 0.25 MG/2ML nebulizer solution Commonly known as:  PULMICORT Take 0.25 mg by nebulization every 6 (six) hours.   cyproheptadine 4 MG tablet Commonly known as:  PERIACTIN Place 4 mg into feeding tube daily.   ergocalciferol 50000 units capsule Commonly known as:  VITAMIN D2 Take 50,000 Units by  mouth once a week.   feeding supplement (JEVITY 1.5 CAL) Liqd Place 1,100 mLs into feeding tube continuous. At bedtime at a rate of 112ml/hr.   levothyroxine 88 MCG tablet Commonly known as:  SYNTHROID, LEVOTHROID Take 88 mcg by mouth daily.   linagliptin 5 MG Tabs tablet Commonly known as:  TRADJENTA Place 5 mg into feeding tube daily.   oxyCODONE 15 MG immediate release tablet Commonly known as:  ROXICODONE Place 7.5-15 mg into feeding tube See admin instructions. Take 1/2 - 1 tablet by feeding  tube twice daily (depending on pain level), may take an additional 1/2 -1 tablet at noon if needed for pain   polyethylene glycol packet Commonly known as:  MIRALAX / GLYCOLAX Place 8.5 g into feeding tube 2 (two) times daily.   tiotropium 18 MCG inhalation capsule Commonly known as:  SPIRIVA Place 18 mcg into inhaler and inhale 2 (two) times daily.      Follow-up Information    ANTHONY,ARENNETTE, NP Follow up in 1 week(s).   Specialty:  Nurse Practitioner Contact information: [address]  [place] [address] [phone]        Truitt Merle, MD .   Specialties:  Hematology, Oncology Why:  will call with appointment Contact information: [place] [address] [phone]            Time coordinating discharge: 35 min  Signed:  [place]   [facility] 09/05/2016, 1:59 PM

## 2016-09-05 NOTE — Progress Notes (Signed)
This CM spoke with pt at bedside to inquire whether he would agree to [facility] services. Pt did agree to HHPT/RN/Aide at home. [facility] rep contacted for referral and MD asked for orders. No other CM needs communicated. Marney Doctor RN,BSN,NCM [phone]

## 2016-09-05 NOTE — Progress Notes (Signed)
Patient ID: [name], male   DOB: [birth year], 82 y.o.   MRN: [id number] Pt resting quietly; daughter in room VSS;AF  WBC 5.4  HGB 12.5 PLTS 69K CREAT .72  AFP 3.5 GJ tube intact, small amt fluid on gauze underneath disk; dressing changed and disc made taut to skin surface Tube appears to be functioning properly at this time; outer disc will need to remain taut against skin to prevent further leakage; ok to use barrier cream on skin beneath disc and maintain  thin layer of drain sponge gauze under disc; per Dr. Vernard Gambles recommend MRI abd to further characterize liver lesions before consideration of biopsy.

## 2016-09-06 ENCOUNTER — Telehealth: Payer: Self-pay | Admitting: Hematology

## 2016-09-07 ENCOUNTER — Telehealth: Payer: Self-pay | Admitting: Hematology

## 2016-09-07 NOTE — Telephone Encounter (Signed)
Telephone call to the patient's daughter to schedule appt. Appt scheduled with Dr. Burr Medico  fro 9/14 at [place] to arrive 15 mintutes early. Mrs. Venia Minks was a pleasant person to speak to. She agreed to the appt date and time.

## 2016-09-13 ENCOUNTER — Telehealth: Payer: Self-pay | Admitting: Hematology

## 2016-09-13 ENCOUNTER — Encounter: Payer: Self-pay | Admitting: Hematology

## 2016-09-13 ENCOUNTER — Ambulatory Visit (HOSPITAL_BASED_OUTPATIENT_CLINIC_OR_DEPARTMENT_OTHER): Payer: Medicare Other | Admitting: Hematology

## 2016-09-13 VITALS — BP 105/57 | HR 142 | Temp 97.5°F | Resp 17 | Ht 69.0 in | Wt 123.8 lb

## 2016-09-13 DIAGNOSIS — K769 Liver disease, unspecified: Secondary | ICD-10-CM

## 2016-09-13 DIAGNOSIS — E119 Type 2 diabetes mellitus without complications: Secondary | ICD-10-CM

## 2016-09-13 DIAGNOSIS — Z8505 Personal history of malignant neoplasm of liver: Secondary | ICD-10-CM

## 2016-09-13 DIAGNOSIS — R160 Hepatomegaly, not elsewhere classified: Secondary | ICD-10-CM

## 2016-09-13 DIAGNOSIS — J449 Chronic obstructive pulmonary disease, unspecified: Secondary | ICD-10-CM

## 2016-09-13 DIAGNOSIS — I4891 Unspecified atrial fibrillation: Secondary | ICD-10-CM | POA: Diagnosis not present

## 2016-09-13 DIAGNOSIS — D696 Thrombocytopenia, unspecified: Secondary | ICD-10-CM | POA: Diagnosis not present

## 2016-09-13 DIAGNOSIS — C220 Liver cell carcinoma: Secondary | ICD-10-CM

## 2016-09-13 DIAGNOSIS — K7460 Unspecified cirrhosis of liver: Secondary | ICD-10-CM

## 2016-09-13 DIAGNOSIS — I10 Essential (primary) hypertension: Secondary | ICD-10-CM

## 2016-09-13 NOTE — Progress Notes (Signed)
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 He presented to the hospital with upper GI bleeding. Workup including CT abdomen and pelvis without contrast showed 2 liver lesions, concerning for recurrent liver cancer.   He lives with his male friend, his daughter lives close to him. I met a post patient and his daughter in the hospital, he is able to take half most his ADLs, still drives, and ambulates with a crane.   INTERVAL HISTORY: Mr. Chichester returns for follow-up. I saw him in the hospital last week. He is accompanied by his daughter to the clinic today. He has been doing relatively well after hospital discharge. He is back to his usual health status, he is able to take care of himself at home, and lives independently. His GJ tube feeding has been going well. No other new complaints.  REVIEW OF SYSTEMS:   Constitutional: Denies fevers, chills or abnormal weight loss, (+) fatigue  Eyes: Denies blurriness of vision Ears, nose, mouth, throat, and face: Denies mucositis or sore throat Respiratory: mild chronic dry cough, no dyspnea or wheezes Cardiovascular: Denies palpitation, chest discomfort or lower extremity swelling Gastrointestinal:  Denies nausea, heartburn or change in bowel habits Skin: Denies abnormal skin rashes Lymphatics: Denies new lymphadenopathy or easy bruising Neurological:Denies numbness, tingling or new  weaknesses Behavioral/Psych: Mood is stable, no new changes  All other systems were reviewed with the patient and are negative.  MEDICAL HISTORY:  Past Medical History:  Diagnosis Date  . Back pain   . Cancer ([place])   . COPD (chronic obstructive pulmonary disease) (HCC)    Gold C  . Coronary artery disease   . Dissecting aortic aneurysm, abdominal ([place]) 11/13/2011   Overview:  Severe aortoiliac stenosis with possible a chronic dissection versus  ulcerated plaque/thrombus extending above and below the aortic  bifurcation; without aneurysmal dilation  as per CT abdomen noted 09/25/2011   . DM2 (diabetes mellitus, type 2) ([place])    takes Tradjenta daily  . Dysphagia    s/p PEG placement  . [place] (hepatocellular carcinoma) ([place])    s/p right lobe resection 2012  . Hyperlipidemia   . Hypertension   . Hypothyroidism    takes Synthroid daily   . Pacemaker 11/11/2015   St. Jude single chamber pacemaker implanted at [facility] 11/2014.  Marland Kitchen Paroxysmal atrial flutter (HCC)    and fibrillation with known bradycardia, Chads2vasc of 4, not on A/C due to thrombocytopenia and functional status  . Peripheral vascular disease ([place])    LICA stent [id number]  . Pneumonia 11/03/2012   aspiration pneumonias  . Pneumothorax 10/2015  . Presence of permanent cardiac pacemaker   . Severe malnutrition ([place])   . Shortness of breath dyspnea    with exertion   . Thrombocytopenia ([place])     SURGICAL HISTORY: Past Surgical History:  Procedure Laterality Date  . CAROTID ANGIOGRAM  2012  . CAROTID STENT  2002  . cataract surgery      bilateral  . COLONOSCOPY    .  DOPPLER ECHOCARDIOGRAPHY  2011  . ESOPHAGOGASTRODUODENOSCOPY  10/01/2012   Procedure: ESOPHAGOGASTRODUODENOSCOPY (EGD);  Surgeon: Gwenyth Ober, MD;  Location: [facility];  Service: General;  Laterality: N/A;  wyatt/leone  . ESOPHAGOGASTRODUODENOSCOPY  10/31/2012   Procedure: ESOPHAGOGASTRODUODENOSCOPY (EGD);  Surgeon: Beryle Beams, MD;  Location: [facility]  ENDOSCOPY;  Service: Endoscopy;  Laterality: N/A;  . IR GENERIC HISTORICAL  07/25/2016   IR [place] DUODEN/JEJUNO TUBE PERCUT W/FLUORO 07/25/2016 Markus Daft, MD [facility]-INTERV RAD  . IR GENERIC HISTORICAL  08/30/2016   IR GJ TUBE CHANGE 08/30/2016 Corrie Mckusick, DO [facility]-INTERV RAD  . IR GENERIC HISTORICAL  09/04/2016   IR CM INJ ANY COLONIC TUBE W/FLUORO 09/04/2016 [facility]-INTERV RAD  . liver canc    . pacemaker inserted    . PARS PLANA VITRECTOMY Right 11/14/2015   Procedure: PARS PLANA VITRECTOMY WITH 25 GAUGE, WITH ENDOLASER;  Surgeon: Hurman Horn, MD;  Location: [facility];  Service: Ophthalmology;  Laterality: Right;  . PEG PLACEMENT  10/01/2012   Procedure: PERCUTANEOUS ENDOSCOPIC GASTROSTOMY (PEG) PLACEMENT;  Surgeon: Gwenyth Ober, MD;  Location: [facility];  Service: General;  Laterality: N/A;  . PEG TUBE PLACEMENT  10/12/2015      replacment tube  . POSTERIOR CERVICAL FUSION/FORAMINOTOMY  09/25/2012   Procedure: POSTERIOR CERVICAL FUSION/FORAMINOTOMY LEVEL 2;  Surgeon: Eustace Moore, MD;  Location: [facility] NEURO ORS;  Service: Neurosurgery;  Laterality: N/A;  Posterior Cervical three-four laminectomy, posterior cervical three-four, four-five fusion    I have reviewed the social history and family history with the patient and they are unchanged from previous note.  ALLERGIES:  has No Known Allergies.  MEDICATIONS:  Current Outpatient Prescriptions  Medication Sig Dispense Refill  . albuterol (PROVENTIL) (2.5 MG/3ML) 0.083% nebulizer solution Take 3 mLs (2.5 mg total) by nebulization every 6 (six) hours as needed for wheezing or shortness of breath. 75 mL 12  . aspirin EC 81 MG tablet Place 81 mg into feeding tube daily.     . budesonide (PULMICORT) 0.25 MG/2ML nebulizer solution Take 0.25 mg by nebulization every 6 (six) hours.     . cyproheptadine (PERIACTIN) 4 MG tablet Place 4 mg into feeding tube daily.     . ergocalciferol (VITAMIN D2) 50000 units capsule Take 50,000 Units by mouth once a week.    .  levothyroxine (SYNTHROID, LEVOTHROID) 88 MCG tablet Take 88 mcg by mouth daily.    Marland Kitchen linagliptin (TRADJENTA) 5 MG TABS tablet Place 5 mg into feeding tube daily.     . Nutritional Supplements (FEEDING SUPPLEMENT, JEVITY 1.5 CAL,) LIQD Place 1,100 mLs into feeding tube continuous. At bedtime at a rate of 123m/hr.    .Marland KitchenoxyCODONE (ROXICODONE) 15 MG immediate release tablet Place 7.5-15 mg into feeding tube See admin instructions. Take 1/2 - 1 tablet by feeding tube twice daily (depending on pain level), may take an additional 1/2 -1 tablet at noon if needed for pain  0  . polyethylene glycol (MIRALAX / GLYCOLAX) packet Place 8.5 g into feeding tube 2 (two) times daily.     .Marland Kitchentiotropium (SPIRIVA) 18 MCG inhalation capsule Place 18 mcg into inhaler and inhale 2 (two) times daily.     No current facility-administered medications for this visit.     PHYSICAL EXAMINATION: ECOG PERFORMANCE STATUS: 1 - Symptomatic but completely ambulatory  Vitals:   09/13/16 1111  BP: (!) 105/57  Pulse: (!) 142  Resp: 17  Temp: 97.5 F (36.4 C)   Filed Weights   09/13/16 1111  Weight: 123 lb 12.8 oz (56.2 kg)    GENERAL:alert, no distress and comfortable SKIN: skin color, texture, turgor are normal, no rashes or significant lesions EYES: normal, Conjunctiva are pink and non-injected, sclera clear OROPHARYNX:no exudate, no erythema and lips, buccal mucosa, and tongue normal  NECK: supple, thyroid normal size, non-tender, without nodularity LYMPH:  no palpable lymphadenopathy in the cervical, axillary or inguinal LUNGS: clear to auscultation and percussion with normal breathing effort HEART: regular rate & rhythm and no murmurs and no lower extremity edema ABDOMEN:abdomen soft, non-tender and normal bowel sounds, (+) feeding tube in mid abdomen.  Musculoskeletal:no cyanosis of digits and no clubbing  NEURO: alert & oriented x 3 with fluent speech, no focal motor/sensory deficits  LABORATORY DATA:  I have  reviewed the data as listed CBC Latest Ref Rng & Units 09/05/2016 09/05/2016 09/04/2016  WBC 4.0 - 10.5 K/uL - 5.4 -  Hemoglobin 13.0 - 17.0 g/dL 14.5 12.5(L) 13.6  Hematocrit 39.0 - 52.0 % 40.4 35.6(L) 38.7(L)  Platelets 150 - 400 K/uL - 69(L) -     CMP Latest Ref Rng & Units 09/05/2016 09/01/2016 08/31/2016  Glucose 65 - 99 mg/dL 193(H) 148(H) 163(H)  BUN 6 - 20 mg/dL 20 28(H) 30(H)  Creatinine 0.61 - 1.24 mg/dL 0.72 0.87 0.89  Sodium 135 - 145 mmol/L 135 142 142  Potassium 3.5 - 5.1 mmol/L 3.9 3.9 3.5  Chloride 101 - 111 mmol/L 104 103 101  CO2 22 - 32 mmol/L 26 31 33(H)  Calcium 8.9 - 10.3 mg/dL 7.7(L) 8.6(L) 9.1  Total Protein 6.5 - 8.1 g/dL - 7.2 7.5  Total Bilirubin 0.3 - 1.2 mg/dL - 1.6(H) 1.9(H)  Alkaline Phos 38 - 126 U/L - 84 88  AST 15 - 41 U/L - 37 37  ALT 17 - 63 U/L - 17 18      RADIOGRAPHIC STUDIES: I have personally reviewed the radiological images as listed and agreed with the findings in the report. No results found.   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 He presented to the hospital with upper GI bleeding. Workup including CT abdomen and pelvis without contrast showed 2 liver lesions, concerning for recurrent liver cancer.   1. Liver lesions, rule out recurrent HCC -His CT abdomen from 06/21/2015 showed a 1.9cm low attenuation lesion within the left lobe of liver, this has increased to 2.3 cm on his recent CT scan from 09/02/2016, and a second new lesion in the left lobe measuring 2.2 cm. I have reviewed his CT scans, this liver lesions are suspicious for recurrent liver cancer.  -His AFP from a few days ago was normal  -He has pacemaker, not able to undergo MRI scan. I recommend a CT abdomen with and without contrast  (liver protocol ) to further evaluate. The scan image  was reviewed in our GI tumor board a few days ago. The CT scan findings was not typical for [facility], and liver mass biopsy is recommended. I discussed with patient and his daughter, he agrees to proceed. I'll refer him to IR -I discussed possible treatment options for recurrent liver cancer, given his advanced age and comorbidities, he would not be a candidate for surgery or transplant. I will likely recommend liver targeted therapy by interventional radiology  -Metastatic malignancy is also possible, we'll decide if  he needs further work up such as colonoscopy after biopsy. His last colonoscopy was several years ago, I'll try to see if I can find a report from Dr. Ulyses Amor office  -I'll see him back after his liver biopsy  2. Liver cirrhosis, secondary to alcohol -He has quit drinking alcohol long time ago -His liver function is compensated, no significant complications.  3. Thrombocytopenia -Likely second to his liver cirrhosis -We'll continue monitoring  4. atrial fibrillation, COPD, hypertension, diabetes, and GJ tube -Follow up with his primary care physician and other specialist  Recommendations: -Liver mass biopsy by IR -I'll see him back in 3 weeks, after his biopsy   All questions were answered. The patient knows to call the clinic with any problems, questions or concerns. No barriers to learning was detected.  I spent 40 minutes counseling the patient face to face. The total time spent in the appointment was 45 minutes and more than 50% was on counseling and review of test results     Truitt Merle, MD 09/13/16

## 2016-09-13 NOTE — Telephone Encounter (Signed)
Avs report and schedule given to patient, per 09/13/16 los. °

## 2016-09-15 NOTE — Addendum Note (Signed)
Addended by: Truitt Merle on: 09/15/2016 10:25 AM   Modules accepted: Orders

## 2016-09-19 ENCOUNTER — Other Ambulatory Visit (HOSPITAL_COMMUNITY): Payer: Self-pay | Admitting: Interventional Radiology

## 2016-09-19 ENCOUNTER — Other Ambulatory Visit: Payer: Self-pay | Admitting: Radiology

## 2016-09-19 DIAGNOSIS — Z431 Encounter for attention to gastrostomy: Secondary | ICD-10-CM

## 2016-09-20 ENCOUNTER — Ambulatory Visit (HOSPITAL_COMMUNITY)
Admission: RE | Admit: 2016-09-20 | Discharge: 2016-09-20 | Disposition: A | Discharge status: Home / Self Care | Payer: Medicare Other | Source: Ambulatory Visit | Attending: Interventional Radiology | Admitting: Interventional Radiology

## 2016-09-20 ENCOUNTER — Encounter (HOSPITAL_COMMUNITY): Payer: Self-pay | Admitting: Interventional Radiology

## 2016-09-20 ENCOUNTER — Other Ambulatory Visit (HOSPITAL_COMMUNITY): Payer: Self-pay | Admitting: Interventional Radiology

## 2016-09-20 DIAGNOSIS — R1310 Dysphagia, unspecified: Secondary | ICD-10-CM | POA: Diagnosis not present

## 2016-09-20 DIAGNOSIS — Z431 Encounter for attention to gastrostomy: Secondary | ICD-10-CM

## 2016-09-20 DIAGNOSIS — Z434 Encounter for attention to other artificial openings of digestive tract: Secondary | ICD-10-CM | POA: Insufficient documentation

## 2016-09-20 HISTORY — PX: IR GENERIC HISTORICAL: IMG1180011

## 2016-09-20 MED ORDER — IOPAMIDOL (ISOVUE-300) INJECTION 61%
INTRAVENOUS | Status: AC
  Administered 2016-09-20: 20 mL
  Filled 2016-09-20: qty 50

## 2016-09-20 NOTE — Procedures (Signed)
Interventional Radiology Procedure Note  HPI:   82 yo male presents to check G-J tube for leakage around the tube.     Procedure: G-J tube injection, flat-panel CT to confirm position, and re-inflation of the retention balloon to assure volume.   Findings:  The G-J is well positioned and functional.  Balloon had 8-9cc of fluid, which was re-inflated to 10cc.     Complications: None Recommendations:  - Ok to use tube.  - Continue local wound care.      Signed,  Dulcy Fanny. Earleen Newport, DO

## 2016-09-24 ENCOUNTER — Encounter (HOSPITAL_COMMUNITY): Payer: Self-pay

## 2016-09-24 ENCOUNTER — Ambulatory Visit (HOSPITAL_COMMUNITY)
Admission: RE | Admit: 2016-09-24 | Discharge: 2016-09-24 | Disposition: A | Discharge status: Home / Self Care | Payer: Medicare Other | Source: Ambulatory Visit | Attending: Hematology | Admitting: Hematology

## 2016-09-24 DIAGNOSIS — C220 Liver cell carcinoma: Secondary | ICD-10-CM | POA: Diagnosis not present

## 2016-09-24 DIAGNOSIS — R160 Hepatomegaly, not elsewhere classified: Secondary | ICD-10-CM | POA: Insufficient documentation

## 2016-09-24 LAB — CBC WITH DIFFERENTIAL/PLATELET
BASOS ABS: 0 10*3/uL (ref 0.0–0.1)
Basophils Relative: 0 %
EOS ABS: 0.3 10*3/uL (ref 0.0–0.7)
Eosinophils Relative: 7 %
HCT: 39.6 % (ref 39.0–52.0)
Hemoglobin: 14 g/dL (ref 13.0–17.0)
Lymphocytes Relative: 27 %
Lymphs Abs: 1.1 10*3/uL (ref 0.7–4.0)
MCH: 33.7 pg (ref 26.0–34.0)
MCHC: 35.4 g/dL (ref 30.0–36.0)
MCV: 95.4 fL (ref 78.0–100.0)
MONO ABS: 0.6 10*3/uL (ref 0.1–1.0)
Monocytes Relative: 14 %
NEUTROS PCT: 52 %
Neutro Abs: 2 10*3/uL (ref 1.7–7.7)
PLATELETS: 83 10*3/uL — AB (ref 150–400)
RBC: 4.15 MIL/uL — AB (ref 4.22–5.81)
RDW: 13.8 % (ref 11.5–15.5)
WBC: 4 10*3/uL (ref 4.0–10.5)

## 2016-09-24 LAB — GLUCOSE, CAPILLARY: Glucose-Capillary: 104 mg/dL — ABNORMAL HIGH (ref 65–99)

## 2016-09-24 LAB — COMPREHENSIVE METABOLIC PANEL
ALT: 25 U/L (ref 17–63)
AST: 43 U/L — AB (ref 15–41)
Albumin: 4.1 g/dL (ref 3.5–5.0)
Alkaline Phosphatase: 98 U/L (ref 38–126)
Anion gap: 8 (ref 5–15)
BUN: 35 mg/dL — ABNORMAL HIGH (ref 6–20)
CHLORIDE: 99 mmol/L — AB (ref 101–111)
CO2: 34 mmol/L — AB (ref 22–32)
CREATININE: 0.82 mg/dL (ref 0.61–1.24)
Calcium: 9.2 mg/dL (ref 8.9–10.3)
GFR calc non Af Amer: >60 mL/min (ref >60)
Glucose, Bld: 117 mg/dL — ABNORMAL HIGH (ref 65–99)
Potassium: 4.5 mmol/L (ref 3.5–5.1)
SODIUM: 141 mmol/L (ref 135–145)
Total Bilirubin: 1.1 mg/dL (ref 0.3–1.2)
Total Protein: 7.9 g/dL (ref 6.5–8.1)

## 2016-09-24 LAB — PROTIME-INR
INR: 1.01
Prothrombin Time: 13.3 seconds (ref 11.4–15.2)

## 2016-09-24 MED ORDER — FENTANYL CITRATE (PF) 100 MCG/2ML IJ SOLN
INTRAMUSCULAR | Status: AC
  Filled 2016-09-24: qty 4

## 2016-09-24 MED ORDER — FENTANYL CITRATE (PF) 100 MCG/2ML IJ SOLN
INTRAMUSCULAR | Status: AC | PRN
  Administered 2016-09-24 (×2): 25 ug via INTRAVENOUS

## 2016-09-24 MED ORDER — MIDAZOLAM HCL 2 MG/2ML IJ SOLN
INTRAMUSCULAR | Status: AC | PRN
  Administered 2016-09-24 (×2): 1 mg via INTRAVENOUS

## 2016-09-24 MED ORDER — SODIUM CHLORIDE 0.9 % IV SOLN
INTRAVENOUS | Status: DC
  Administered 2016-09-24: 12:00:00 via INTRAVENOUS

## 2016-09-24 MED ORDER — MIDAZOLAM HCL 2 MG/2ML IJ SOLN
INTRAMUSCULAR | Status: AC
  Filled 2016-09-24: qty 6

## 2016-09-24 NOTE — Procedures (Signed)
Technically successful US guided biopsy of indeterminate lesion within the central aspect of the residual liver.   EBL: None No immediate complications.   Ronny Bacon, MD Pager #: [phone]

## 2016-09-24 NOTE — Discharge Instructions (Signed)
°Liver Biopsy °The liver is a large organ in the upper right-hand side of your abdomen. A liver biopsy is a procedure in which a tissue sample is taken from the liver and examined under a microscope. The procedure is done to confirm a suspected problem. °There are three types of liver biopsies: °· Percutaneous. In this type, an incision is made in your abdomen. The sample is removed through the incision with a needle. °· Laparoscopic. In this type, several incisions are made in the abdomen. A tiny camera is passed through one of the incisions to help guide the health care provider. The sample is removed through the other incision or incisions. °· Transjugular. In this type, an incision is made in the neck. A tube is passed through the incision to the liver. The sample is removed through the tube with a needle. °LET YOUR HEALTH CARE PROVIDER KNOW ABOUT: °· Any allergies you have. °· All medicines you are taking, including vitamins, herbs, eye drops, creams, and over-the-counter medicines. °· Previous problems you or members of your family have had with the use of anesthetics. °· Any blood disorders you have. °· Previous surgeries you have had. °· Medical conditions you have. °· Possibility of pregnancy, if this applies. °RISKS AND COMPLICATIONS °Generally, this is a safe procedure. However, problems can occur and include: °· Bleeding. °· Infection. °· Bruising. °· Collapsed lung. °· Leak of digestive juices (bile) from the liver or gallbladder. °· Problems with heart rhythm. °· Pain at the biopsy site or in the right shoulder. °· Low blood pressure (hypotension). °· Injury to nearby organs or tissues. °BEFORE THE PROCEDURE °· Your health care provider may do some blood or urine tests. These will help your health care provider learn how well your kidneys and liver are working and how well your blood clots. °· Ask your health care provider if you will be able to go home the day of the procedure. Arrange for someone to  take you home and stay with you for at least 24 hours. °· Do not eat or drink anything after midnight on the night before the procedure or as directed by your health care provider. °· Ask your health care provider about: °¨ Changing or stopping your regular medicines. This is especially important if you are taking diabetes medicines or blood thinners. °¨ Taking medicines such as aspirin and ibuprofen. These medicines can thin your blood. Do not take these medicines before your procedure if your health care provider asks you not to. °PROCEDURE °Regardless of the type of biopsy that will be done, you will have an IV line placed. Through this line, you will receive fluids and medicine to relax you. If you will be having a laparoscopic biopsy, you may also receive medicine through this line to make you sleep during the procedure (general anesthetic). °Percutaneous Liver Biopsy °· You will positioned on your back, with your right hand over your head. °· A health care provider will locate your liver by tapping and pressing on the right side of your abdomen or with the help of an ultrasound machine or CT scan. °· An area at the bottom of your last right rib will be numbed. °· An incision will be made in the numbed area. °· The biopsy needle will be inserted into the incision. °· Several samples of liver tissue will be taken with the biopsy needle. You will be asked to hold your breath as each sample is taken. °Laparoscopic Liver Biopsy °· You will be   positioned on your back. °· Several small incisions will be made in your abdomen. °· Your doctor will pass a tiny camera through one incision. The camera will allow the liver to be viewed on a TV monitor in the operating room. °· Tools will be passed through the other incision or incisions. These tools will be used to remove samples of liver tissue. °Transjugular Liver Biopsy °· You will be positioned on your back on an X-ray table, with your head turned to your left. °· An  area on your neck just over your jugular vein will be numbed. °· An incision will be made in the numbed area. °· A tiny tube will be inserted through the incision. It will be pushed through the jugular vein to a blood vessel in the liver called the hepatic vein. °· Dye will be inserted through the tube, and X-rays will be taken. The dye will make the blood vessels in the liver light up on the X-rays. °· The biopsy needle will be pushed through the tube until it reaches the liver. °· Samples of liver tissue will be taken with the biopsy needle. °· The needle and the tube will be removed. °After the samples are obtained, the incision or incisions will be closed. °AFTER THE PROCEDURE °· You will be taken to a recovery area. °· You may have to lie on your right side for 1-2 hours. This will prevent bleeding from the biopsy site. °· Your progress will be watched. Your blood pressure, pulse, and the biopsy site will be checked often. °· You may have some pain or feel sick. If this happens, tell your health care provider. °· As you begin to feel better, you will be offered ice and beverages. °· You may be allowed to go home when the medicines have worn off and you can walk, drink, eat, and use the bathroom. °  °This information is not intended to replace advice given to you by your health care provider. Make sure you discuss any questions you have with your health care provider. °  °Document Released: 03/08/2004 Document Revised: 01/07/2015 Document Reviewed: 02/12/2014 °Elsevier Interactive Patient Education ©2016 [employer] °Liver Biopsy, Care After °Refer to this sheet in the next few weeks. These instructions provide you with information on caring for yourself after your procedure. Your health care provider may also give you more specific instructions. Your treatment has been planned according to current medical practices, but problems sometimes occur. Call your health care provider if you have any problems or  questions after your procedure. °WHAT TO EXPECT AFTER THE PROCEDURE °After your procedure, it is typical to have the following: °· A small amount of discomfort in the area where the biopsy was done and in the right shoulder or shoulder blade. °· A small amount of bruising around the area where the biopsy was done and on the skin over the liver. °· Sleepiness and fatigue for the rest of the day. °HOME CARE INSTRUCTIONS  °· Rest at home for 1-2 days or as directed by your health care provider. °· Have a friend or family member stay with you for at least 24 hours. °· Because of the medicines used during the procedure, you should not do the following things in the first 24 hours: °· Drive. °· Use machinery. °· Be responsible for the care of other people. °· Sign legal documents. °· Take a bath or shower. °· There are many different ways to close and cover an incision, including stitches,   skin glue, and adhesive strips. Follow your health care provider's instructions on: °· Incision care. °· Bandage (dressing) changes and removal. °· Incision closure removal. °· Do not drink alcohol in the first week. °· Do not lift more than 5 pounds or play contact sports for 2 weeks after this test. °· Take medicines only as directed by your health care provider. Do not take medicine containing aspirin or non-steroidal anti-inflammatory medicines such as ibuprofen for 1 week after this test. °· It is your responsibility to get your test results. °SEEK MEDICAL CARE IF:  °· You have increased bleeding from an incision that results in more than a small spot of blood. °· You have redness, swelling, or increasing pain in any incisions. °· You notice a discharge or a bad smell coming from any of your incisions. °· You have a fever or chills. °SEEK IMMEDIATE MEDICAL CARE IF:  °· You develop swelling, bloating, or pain in your abdomen. °· You become dizzy or faint. °· You develop a rash. °· You are nauseous or vomit. °· You have difficulty  breathing, feel short of breath, or feel faint. °· You develop chest pain. °· You have problems with your speech or vision. °· You have trouble balancing or moving your arms or legs. °  °This information is not intended to replace advice given to you by your health care provider. Make sure you discuss any questions you have with your health care provider. °  °Document Released: 07/06/2005 Document Revised: 01/07/2015 Document Reviewed: 02/12/2014 °Elsevier Interactive Patient Education ©2016 [employer] °Moderate Conscious Sedation, Adult °Sedation is the use of medicines to promote relaxation and relieve discomfort and anxiety. Moderate conscious sedation is a type of sedation. Under moderate conscious sedation you are less alert than normal but are still able to respond to instructions or stimulation. Moderate conscious sedation is used during short medical and dental procedures. It is milder than deep sedation or general anesthesia and allows you to return to your regular activities sooner. °LET YOUR HEALTH CARE PROVIDER KNOW ABOUT:  °· Any allergies you have. °· All medicines you are taking, including vitamins, herbs, eye drops, creams, and over-the-counter medicines. °· Use of steroids (by mouth or creams). °· Previous problems you or members of your family have had with the use of anesthetics. °· Any blood disorders you have. °· Previous surgeries you have had. °· Medical conditions you have. °· Possibility of pregnancy, if this applies. °· Use of cigarettes, alcohol, or illegal drugs. °RISKS AND COMPLICATIONS °Generally, this is a safe procedure. However, as with any procedure, problems can occur. Possible problems include: °· Oversedation. °· Trouble breathing on your own. You may need to have a breathing tube until you are awake and breathing on your own. °· Allergic reaction to any of the medicines used for the procedure. °BEFORE THE PROCEDURE °· You may have blood tests done. These tests can help show  how well your kidneys and liver are working. They can also show how well your blood clots. °· A physical exam will be done.   °· Only take medicines as directed by your health care provider. You may need to stop taking medicines (such as blood thinners, aspirin, or nonsteroidal anti-inflammatory drugs) before the procedure.   °· Do not eat or drink at least 6 hours before the procedure or as directed by your health care provider. °· Arrange for a responsible adult, family member, or friend to take you home after the procedure. He or she should stay with   you for at least 24 hours after the procedure, until the medicine has worn off. °PROCEDURE  °· An intravenous (IV) catheter will be inserted into one of your veins. Medicine will be able to flow directly into your body through this catheter. You may be given medicine through this tube to help prevent pain and help you relax. °· The medical or dental procedure will be done. °AFTER THE PROCEDURE °· You will stay in a recovery area until the medicine has worn off. Your blood pressure and pulse will be checked.   °·  Depending on the procedure you had, you may be allowed to go home when you can tolerate liquids and your pain is under control. °  °This information is not intended to replace advice given to you by your health care provider. Make sure you discuss any questions you have with your health care provider. °  °Document Released: 09/11/2001 Document Revised: 01/07/2015 Document Reviewed: 08/24/2013 °Elsevier Interactive Patient Education ©2016 [employer]vier Inc. ° °

## 2016-09-24 NOTE — H&P (Signed)
Chief Complaint: liver mass  Referring Physician:Dr. Truitt Merle  Supervising Physician: Sandi Mariscal  Patient Status: Out-pt  HPI: [name] is an 82 y.o. male who is well-known to our service for frequent GJ tube exchanges secondary to clogging of the J-tube portion.  He just had this injected by [place] on Thursday 9-21, but had it exchanged Friday at [facility].  He has a history of [place] and was treated.  On recent admission, he was noted to have some new liver lesions.  His AFP was normal and he is unable to get an MRI due to his pacemaker.  He was discussed at tumor board and a liver CT was recommended.  These results did not necessarily seem c/w [place] and a liver biopsy has been recommended.  He presents today for this procedure.  Past Medical History:  Past Medical History:  Diagnosis Date  . Back pain   . Cancer ([place])   . COPD (chronic obstructive pulmonary disease) (HCC)    [place]  . Coronary artery disease   . Dissecting aortic aneurysm, abdominal ([place]) 11/13/2011   Overview:  Severe aortoiliac stenosis with possible a chronic dissection versus  ulcerated plaque/thrombus extending above and below the aortic  bifurcation; without aneurysmal dilation  as per CT abdomen noted 09/25/2011   . DM2 (diabetes mellitus, type 2) ([place])    takes Tradjenta daily  . Dysphagia    s/p PEG placement  . [place] (hepatocellular carcinoma) ([place])    s/p right lobe resection 2012  . Hyperlipidemia   . Hypertension   . Hypothyroidism    takes Synthroid daily   . Pacemaker 11/11/2015   St. Jude single chamber pacemaker implanted [facility] 11/2014.  Marland Kitchen Paroxysmal atrial flutter (HCC)    and fibrillation with known bradycardia, Chads2vasc of 4, not on A/C due to thrombocytopenia and functional status  . Peripheral vascular disease ([place])    LICA stent [id number]  . Pneumonia 11/03/2012   aspiration pneumonias  . Pneumothorax 10/2015  . Presence of permanent cardiac pacemaker   . Severe malnutrition ([place])   .  Shortness of breath dyspnea    with exertion   . Thrombocytopenia ([place])     Past Surgical History:  Past Surgical History:  Procedure Laterality Date  . CAROTID ANGIOGRAM  2012  . CAROTID STENT  2002  . cataract surgery      bilateral  . COLONOSCOPY    . DOPPLER ECHOCARDIOGRAPHY  2011  . ESOPHAGOGASTRODUODENOSCOPY  10/01/2012   Procedure: ESOPHAGOGASTRODUODENOSCOPY (EGD);  Surgeon: Gwenyth Ober, MD;  Location: [place];  Service: General;  Laterality: N/A;  wyatt/leone  . ESOPHAGOGASTRODUODENOSCOPY  10/31/2012   Procedure: ESOPHAGOGASTRODUODENOSCOPY (EGD);  Surgeon: Beryle Beams, MD;  Location: [facility] ENDOSCOPY;  Service: Endoscopy;  Laterality: N/A;  . IR GENERIC HISTORICAL  07/25/2016   IR [place] DUODEN/JEJUNO TUBE PERCUT W/FLUORO 07/25/2016 Markus Daft, MD [facility]INTER[facility]  . IR GENERIC HISTORICAL  08/30/2016   IR GJ TUBE CHANGE 08/30/2016 Corrie Mckusick, DO [facility]  . IR GENERIC HISTORICAL  09/04/2016   IR CM INJ ANY COLONIC TUBE W/FLUORO 09/04/2016 [facility]INTER[facility]  . IR GENERIC HISTORICAL  09/20/2016   IR CM INJ ANY COLONIC TUBE W/FLUORO 09/20/2016 Corrie Mckusick, DO [facility]  . IR GENERIC HISTORICAL  09/20/2016   IR CT SPINE LTD 09/20/2016 Corrie Mckusick, DO [facility]  . liver canc    . pacemaker inserted    . PARS PLANA VITRECTOMY Right 11/14/2015   Procedure:  PARS PLANA VITRECTOMY WITH 25 GAUGE, WITH ENDOLASER;  Surgeon: Hurman Horn, MD;  Location: [place];  Service: Ophthalmology;  Laterality: Right;  . PEG PLACEMENT  10/01/2012   Procedure: PERCUTANEOUS ENDOSCOPIC GASTROSTOMY (PEG) PLACEMENT;  Surgeon: Gwenyth Ober, MD;  Location: [place];  Service: General;  Laterality: N/A;  . PEG TUBE PLACEMENT  10/12/2015      replacment tube  . POSTERIOR CERVICAL FUSION/FORAMINOTOMY  09/25/2012   Procedure: POSTERIOR CERVICAL FUSION/FORAMINOTOMY LEVEL 2;  Surgeon: Eustace Moore, MD;  Location: [place] NEURO ORS;  Service: Neurosurgery;  Laterality: N/A;  Posterior Cervical three-four  laminectomy, posterior cervical three-four, four-five fusion    Family History:  Family History  Problem Relation Age of Onset  . Cancer Father     throat, died young  . Liver disease Brother   . CAD Neg Hx     Social History:  reports that he quit smoking about 5 years ago. His smoking use included Cigarettes. He has a 55.00 pack-year smoking history. He has never used smokeless tobacco. He reports that he does not drink alcohol or use drugs.  Allergies: No Known Allergies  Medications: Medications reviewed in epic  Please HPI for pertinent positives, otherwise complete 10 system ROS negative.  Mallampati Score: MD Evaluation Airway: WNL Heart: WNL Abdomen: Other (comments) Abdomen comments: GJ tube in place Chest/ Lungs: WNL ASA  Classification: 3 Mallampati/Airway Score: Two  Physical Exam: BP (!) 96/54   Pulse 66   Temp 97.3 F (36.3 C) (Oral)   Resp 14   Ht 5\' 9"  (1.753 m)   Wt 123 lb 12.8 oz (56.2 kg)   SpO2 97% Comment: had to take on earlobe as fingertips cold   BMI 18.28 kg/m  Body mass index is 18.28 kg/m. General: pleasant, elderly frail white male who is laying in bed in NAD HEENT: head is normocephalic, atraumatic.  Sclera are noninjected.  PERRL.  Ears and nose without any masses or lesions.  Mouth is pink and moist, no teeth Heart: regular, rate, and rhythm.  Normal s1,s2. No obvious murmurs, gallops, or rubs noted.  Palpable radial and pedal pulses bilaterally Lungs: CTAB, no wheezes, rhonchi, or rales noted.  Respiratory effort nonlabored Abd: soft, NT, ND, +BS, no masses, hernias, or organomegaly, GJ tube in place Psych: A&Ox3 with an appropriate affect.   Labs: Results for orders placed or performed during the hospital encounter of 09/24/16 (from the past 48 hour(s))  CBC with Differential/Platelet     Status: Abnormal (Preliminary result)   Collection Time: 09/24/16 11:12 AM  Result Value Ref Range   WBC 4.0 4.0 - 10.5 K/uL   RBC 4.15 (L)  4.22 - 5.81 MIL/uL   Hemoglobin 14.0 13.0 - 17.0 g/dL   HCT 39.6 39.0 - 52.0 %   MCV 95.4 78.0 - 100.0 fL   MCH 33.7 26.0 - 34.0 pg   MCHC 35.4 30.0 - 36.0 g/dL   RDW 13.8 11.5 - 15.5 %   Platelets PENDING 150 - 400 K/uL   Neutrophils Relative % PENDING %   Neutro Abs PENDING 1.7 - 7.7 K/uL   Band Neutrophils PENDING %   Lymphocytes Relative PENDING %   Lymphs Abs PENDING 0.7 - 4.0 K/uL   Monocytes Relative PENDING %   Monocytes Absolute PENDING 0.1 - 1.0 K/uL   Eosinophils Relative PENDING %   Eosinophils Absolute PENDING 0.0 - 0.7 K/uL   Basophils Relative PENDING %   Basophils Absolute PENDING 0.0 - 0.1  K/uL   WBC Morphology PENDING    RBC Morphology PENDING    Smear Review PENDING    nRBC PENDING 0 /100 WBC   Metamyelocytes Relative PENDING %   Myelocytes PENDING %   Promyelocytes Absolute PENDING %   Blasts PENDING %  Protime-INR     Status: None   Collection Time: 09/24/16 11:12 AM  Result Value Ref Range   Prothrombin Time 13.3 11.4 - 15.2 seconds   INR 1.01     Imaging: No results found.  Assessment/Plan 1. Liver mass -labs and vitals have been reviewed. -we will proceed with US guided liver lesion biopsy today -Risks and Benefits discussed with the patient including, but not limited to bleeding, infection, damage to adjacent structures or low yield requiring additional tests. All of the patient's questions were answered, patient is agreeable to proceed. Consent signed and in chart.  Thank you for this interesting consult.  I greatly enjoyed meeting [name] and look forward to participating in their care.  A copy of this report was sent to the requesting provider on this date.  Electronically Signed: Henreitta Cea 09/24/2016, 12:40 PM   I spent a total of    25 Minutes in face to face in clinical consultation, greater than 50% of which was counseling/coordinating care for liver lesion

## 2016-09-26 ENCOUNTER — Other Ambulatory Visit: Payer: Self-pay | Admitting: Hematology

## 2016-09-26 ENCOUNTER — Other Ambulatory Visit: Payer: Self-pay | Admitting: *Deleted

## 2016-09-26 DIAGNOSIS — C220 Liver cell carcinoma: Secondary | ICD-10-CM

## 2016-09-28 ENCOUNTER — Telehealth: Payer: Self-pay | Admitting: Hematology

## 2016-09-28 NOTE — Telephone Encounter (Signed)
ADJUSTED 10/6 F/U FROM 915 AM TO 10AM. SPOKE WITH PATIENT HE IS AWARE.

## 2016-10-04 ENCOUNTER — Ambulatory Visit
Admission: RE | Admit: 2016-10-04 | Discharge: 2016-10-04 | Disposition: A | Discharge status: Home / Self Care | Payer: Medicare Other | Source: Ambulatory Visit | Attending: Hematology | Admitting: Hematology

## 2016-10-04 DIAGNOSIS — C220 Liver cell carcinoma: Secondary | ICD-10-CM

## 2016-10-04 HISTORY — PX: IR GENERIC HISTORICAL: IMG1180011

## 2016-10-04 NOTE — Consult Note (Signed)
Chief Complaint: Hepatocellular carcinoma  Referring Physician(s): Feng,Yan  History of Present Illness: [name] is a 82 y.o. 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  He was found to have indeterminate liver lesions on CT scan of the abdomen and pelvis performed 09/05/2016 with ultrasound-guided biopsy performed 09/24/2016 confirming recurrent hepatocellular carcinoma. The patient presents to the interventional radiology clinic for evaluation of candidacy for potential percutaneous treatment options. The patient is accompanied by his daughter though serves as his own historian.  The patient remains asymptomatic in regards to his recurrent hepatocellular carcinoma. No unintentional weight loss or weight gain. No yellowing of the skin or eyes. No abdominal bloating. No change in mental status.  While the patient is cachectic and very sickly looking, he continues to live alone and remains independent with activities of daily living. He ambulates with a quad cane.  Past Medical History:  Diagnosis Date  . Back pain   . Cancer ([place])   . COPD (chronic obstructive pulmonary disease) (HCC)    [place]  . Coronary artery disease   . Dissecting aortic aneurysm, abdominal ([place]) 11/13/2011   Overview:  Severe aortoiliac stenosis with possible a chronic dissection versus  ulcerated plaque/thrombus extending above and below the aortic  bifurcation; without aneurysmal dilation  as per CT abdomen noted 09/25/2011   . DM2 (diabetes mellitus, type 2) ([place])    takes Tradjenta daily  . Dysphagia    s/p PEG placement  . [place] (hepatocellular carcinoma) ([place])    s/p right lobe resection 2012  . Hyperlipidemia   . Hypertension   . Hypothyroidism    takes Synthroid daily   . Pacemaker 11/11/2015   St. Jude single chamber pacemaker implanted at [facility] 11/2014.  Marland Kitchen Paroxysmal atrial flutter (HCC)    and fibrillation with known bradycardia, Chads2vasc of 4, not on A/C due to thrombocytopenia and functional status  . Peripheral vascular disease ([place])    LICA stent [id number]  . Pneumonia 11/03/2012   aspiration pneumonias  . Pneumothorax 10/2015  . Presence of permanent cardiac pacemaker   . Severe malnutrition ([place])   . Shortness of breath dyspnea    with exertion   . Thrombocytopenia ([place])     Past Surgical History:  Procedure Laterality Date  . CAROTID ANGIOGRAM  2012  . CAROTID STENT  2002  . cataract surgery      bilateral  . COLONOSCOPY    . DOPPLER ECHOCARDIOGRAPHY  2011  . ESOPHAGOGASTRODUODENOSCOPY  10/01/2012   Procedure: ESOPHAGOGASTRODUODENOSCOPY (EGD);  Surgeon: Gwenyth Ober, MD;  Location: [place];  Service: General;  Laterality: N/A;  wyatt/leone  . ESOPHAGOGASTRODUODENOSCOPY  10/31/2012   Procedure: ESOPHAGOGASTRODUODENOSCOPY (EGD);  Surgeon: Beryle Beams, MD;  Location[facility] ENDOSCOPY;  Service: Endoscopy;  Laterality: N/A;  . IR GENERIC HISTORICAL  07/25/2016   IR [place] DUODEN/JEJUNO TUBE PERCUT W/FLUORO 07/25/2016 Markus Daft, MD [facility]  . IR GENERIC HISTORICAL  08/30/2016   IR GJ TUBE CHANGE 08/30/2016 Corrie Mckusick, DO [facility]  . IR GENERIC HISTORICAL  09/04/2016   IR CM INJ ANY COLONIC TUBE W/FLUORO 09/04/2016 [facility]  . IR GENERIC HISTORICAL  09/20/2016   IR CM INJ ANY COLONIC TUBE W/FLUORO 09/20/2016 Corrie Mckusick, DO [facility]  .  IR GENERIC HISTORICAL  09/20/2016   IR CT SPINE LTD 09/20/2016 Corrie Mckusick, DO [facility]  . liver canc    . pacemaker inserted    . PARS PLANA VITRECTOMY Right 11/14/2015   Procedure: PARS PLANA VITRECTOMY WITH 25 GAUGE, WITH ENDOLASER;  Surgeon: Hurman Horn, MD;  Location:  [place];  Service: Ophthalmology;  Laterality: Right;  . PEG PLACEMENT  10/01/2012   Procedure: PERCUTANEOUS ENDOSCOPIC GASTROSTOMY (PEG) PLACEMENT;  Surgeon: Gwenyth Ober, MD;  Location: [place];  Service: General;  Laterality: N/A;  . PEG TUBE PLACEMENT  10/12/2015      replacment tube  . POSTERIOR CERVICAL FUSION/FORAMINOTOMY  09/25/2012   Procedure: POSTERIOR CERVICAL FUSION/FORAMINOTOMY LEVEL 2;  Surgeon: Eustace Moore, MD;  Location: [place] NEURO ORS;  Service: Neurosurgery;  Laterality: N/A;  Posterior Cervical three-four laminectomy, posterior cervical three-four, four-five fusion    Allergies: Review of patient's allergies indicates no known allergies.  Medications: Prior to Admission medications   Medication Sig Start Date End Date Taking? Authorizing Provider  albuterol (PROVENTIL) (2.5 MG/3ML) 0.083% nebulizer solution Take 3 mLs (2.5 mg total) by nebulization every 6 (six) hours as needed for wheezing or shortness of breath. 09/12/14  Yes Oswald Hillock, MD  aspirin EC 81 MG tablet Place 81 mg into feeding tube daily.    Yes Historical Provider, MD  budesonide (PULMICORT) 0.25 MG/2ML nebulizer solution Take 0.25 mg by nebulization every 6 (six) hours.  05/24/15  Yes Historical Provider, MD  cyproheptadine (PERIACTIN) 4 MG tablet Place 4 mg into feeding tube daily.  05/24/15  Yes Historical Provider, MD  ergocalciferol (VITAMIN D2) 50000 units capsule Take 50,000 Units by mouth once a week.   Yes Historical Provider, MD  levothyroxine (SYNTHROID, LEVOTHROID) 88 MCG tablet Take 88 mcg by mouth daily.   Yes Historical Provider, MD  linagliptin (TRADJENTA) 5 MG TABS tablet Place 5 mg into feeding tube daily.    Yes Historical Provider, MD  Nutritional Supplements (FEEDING SUPPLEMENT, OSMOLITE 1.5 CAL,) LIQD Place 1,000 mLs into feeding tube continuous.   Yes Historical Provider, MD  oxyCODONE (ROXICODONE) 15 MG immediate release tablet Place 7.5-15 mg into feeding tube See admin  instructions. Take 1/2 - 1 tablet by feeding tube twice daily (depending on pain level), may take an additional 1/2 -1 tablet at noon if needed for pain 09/09/15  Yes Historical Provider, MD  polyethylene glycol (MIRALAX / GLYCOLAX) packet Place 8.5 g into feeding tube 2 (two) times daily.  11/09/15  Yes Historical Provider, MD  tiotropium (SPIRIVA) 18 MCG inhalation capsule Place 18 mcg into inhaler and inhale 2 (two) times daily.   Yes Historical Provider, MD  Nutritional Supplements (FEEDING SUPPLEMENT, JEVITY 1.5 CAL,) LIQD Place 1,100 mLs into feeding tube continuous. At bedtime at a rate of 174ml/hr.    Historical Provider, MD     Family History  Problem Relation Age of Onset  . Cancer Father     throat, died young  . Liver disease Brother   . CAD Neg Hx     Social History   Social History  . Marital status: Widowed    Spouse name: N/A  . Number of children: 2  . Years of education: N/A   Occupational History  .  Retired   Social History Main Topics  . Smoking status: Former Smoker    Packs/day: 1.00    Years: 55.00    Types: Cigarettes    Quit date: 12/31/2010  . Smokeless tobacco:  Never Used     Comment: quit smoking about 5 yrs ago  . Alcohol use No     Comment: no alcohol in 2012, he used to drink heavyly in teh past   . Drug use: No  . Sexual activity: Not on file   Other Topics Concern  . Not on file   Social History Narrative   Lives with male friend who is also a caretaker.      ECOG Status: 1 - Symptomatic but completely ambulatory  Review of Systems: A 12 point ROS discussed and pertinent positives are indicated in the HPI above.  All other systems are negative.  Review of Systems  Constitutional: Negative for activity change and appetite change.  Respiratory: Negative.   Cardiovascular: Negative.   Gastrointestinal: Negative for abdominal distention and abdominal pain.    Vital Signs: Ht 5\' 9"  (1.753 m)   Wt 120 lb (54.4 kg)   BMI 17.72 kg/m     Physical Exam  Constitutional:  Cachectic and appears older than his stated age.  HENT:  Head: Normocephalic and atraumatic.  Eyes:  No scleral icterus.  Cardiovascular:  Distant heart sounds.  Pulmonary/Chest:  Decreased in the bilateral lung bases.  Abdominal: Soft. Bowel sounds are normal.  Well healed midline abdominal incision.  External portion of gastrostomy tube within the left upper abdominal quadrant.  Skin:  No jaundice.  Nursing note and vitals reviewed.   Imaging:  Selected images from Hepatic protocol CT scan - 09/05/2016 amd ultrasound-guided liver lesion biopsy - 09/24/2016 were reviewed in detail.  US Biopsy  Result Date: 09/24/2016 INDICATION: Remote history of HCC, now with ill-defined liver lesions worrisome for disease recurrence. Please perform ultrasound-guided liver lesion biopsy. EXAM: ULTRASOUND GUIDED LIVER LESION BIOPSY COMPARISON:  Cirrhotic protocol abdominal CT - 09/05/2016 MEDICATIONS: None ANESTHESIA/SEDATION: Fentanyl 50 mcg IV; Versed 2 mg IV Total Moderate Sedation time: 15 minutes. The patient's level of consciousness and vital signs were monitored continuously by radiology nursing throughout the procedure under my direct supervision. COMPLICATIONS: None immediate. PROCEDURE: Informed written consent was obtained from the patient after a discussion of the risks, benefits and alternatives to treatment. The patient understands and consents the procedure. A timeout was performed prior to the initiation of the procedure. Ultrasound scanning was performed of the right upper abdominal quadrant demonstrates an ill-defined approximately 2.2 x 1.4 cm lesion within the central aspect of the lateral segment of the left lobe of the liver compatible with the dominant central lesion seen on preceding CT scan image 29, series 5). This lesion was targeted for biopsy as the additional lesion within the subcapsular aspect of the left lobe the liver was found to be  immediately superficial to the abdominal aorta. The procedure was planned. The midline of the upper abdomen was prepped and draped in the usual sterile fashion. The overlying soft tissues were anesthetized with 1% lidocaine with epinephrine. A 17 gauge, 6.8 cm co-axial needle was advanced into a peripheral aspect of the lesion. This was followed by 3 core biopsies with an 18 gauge core device under direct ultrasound guidance. The coaxial needle tract was embolized with a small amount of Gel-Foam slurry and superficial hemostasis was obtained with manual compression. Post procedural scanning was negative for definitive area of hemorrhage or additional complication. A dressing was placed. The patient tolerated the procedure well without immediate post procedural complication. IMPRESSION: Technically successful ultrasound guided core needle biopsy of dominant ill-defined lesion within the central aspect of the lateral segment  of the left lobe of the liver. Electronically Signed   By: Sandi Mariscal M.D.   On: 09/24/2016 14:52   Ir Cm Inj Any Colonic Tube W/fluoro  Result Date: 09/20/2016 INDICATION: 82 year old male with a history of dysphagia, with percutaneous gastrojejunostomy. The tube has been ingested 09/04/2016. This was after exchange of the tube 08/30/2016 for and occluded tube. CT 09/05/2016 performed for surveillance of his metastatic disease demonstrates adequate position of the tube. Today he returns with drainage around the tube. EXAM: GI TUBE INJECTION; IR CT SPINE LIMITED MEDICATIONS: None ANESTHESIA/SEDATION: None CONTRAST:  20 cc - administered into the gastric lumen. FLUOROSCOPY TIME:  Fluoroscopy Time: 0 minutes 54 seconds (118 mGy). COMPLICATIONS: None PROCEDURE: Informed written consent was obtained from the patient after a thorough discussion of the procedural risks, benefits and alternatives. All questions were addressed. Personal protective gear was used. A timeout was performed prior to the  initiation of the procedure. Patient positioned supine position on the fluoroscopy table. Injection of the jejunostomy port can the gastrostomy port confirmed adequate location in the stomach. A flap panel CT was performed of the upper abdomen. This confirmed the balloon in adequate position at the stomach wall. The balloon was then entirely deflated and gained approximately 7-8 cc of fluid. 10 cc of saline was injected to re- inflate the balloon. The bumper was advanced for good approximation to the scan and a dry dressing was placed. IMPRESSION: Status post drain injection confirming adequate position of the gastrojejunostomy tube. A flap panel CT which was performed demonstrating good approximation of the balloon with the abdominal wall. Complete filling of the balloon to 10 cc of volume was performed. Signed, Dulcy Fanny. Earleen Newport, DO [facility] Radiology PLAN: Should drainage around the site remain to be a problem, I think 1 option would be discuss with the patient a new gastrostomy puncture adjacent to the existing puncture so that the old tube may be removed and the gastropexy can heal/close. Electronically Signed   By: Corrie Mckusick D.O.   On: 09/20/2016 17:18   Ir Cm Inj Any Colonic Tube W/fluoro  Result Date: 09/04/2016 CLINICAL DATA:  Leaking around dual-lumen gastrojejunostomy catheter. Recent CT demonstrates retention balloon may be beyond the pylorus. EXAM: GASTROJEJUNOSTOMY CATHETER INJECTION UNDER FLUOROSCOPY TECHNIQUE: The procedure, risks (including but not limited to bleeding, infection, organ damage ), benefits, and alternatives were explained to the patient. Questions regarding the procedure were encouraged and answered. The patient understands and consents to the procedure. Survey fluoroscopic inspection reveals stable position of the gastrojejunostomy catheter. Injection demonstrates patent jejunal lumen, tip beyond the ligament of Treitz. The  retention balloon is beyond the pylorus, and injection into the gastric lumen opacifies the duodenal lumen. The retention balloon was deflated and retracted into the gastric lumen, then reinflated to 10 mL and brought up against the anterior gastric wall. The external bumper was applied. Injection of gastrostomy lumen demonstrates contrast flow into the lumen of the decompressed stomach. The patient tolerated the procedure well. . IMPRESSION: 1. Repositioning of gastrojejunostomy catheter retention balloon. Okay for routine use. Electronically Signed   By: Lucrezia Europe M.D.   On: 09/04/2016 17:09   [place]  Result Date: 09/20/2016 INDICATION: 82 year old male with a history of dysphagia, with percutaneous gastrojejunostomy. The tube has been ingested 09/04/2016. This was after exchange of the tube 08/30/2016 for and occluded tube. CT 09/05/2016 performed for surveillance of his metastatic disease demonstrates adequate position of the tube.  Today he returns with drainage around the tube. EXAM: GI TUBE INJECTION; IR CT SPINE LIMITED MEDICATIONS: None ANESTHESIA/SEDATION: None CONTRAST:  20 cc - administered into the gastric lumen. FLUOROSCOPY TIME:  Fluoroscopy Time: 0 minutes 54 seconds (118 mGy). COMPLICATIONS: None PROCEDURE: Informed written consent was obtained from the patient after a thorough discussion of the procedural risks, benefits and alternatives. All questions were addressed. Personal protective gear was used. A timeout was performed prior to the initiation of the procedure. Patient positioned supine position on the fluoroscopy table. Injection of the jejunostomy port can the gastrostomy port confirmed adequate location in the stomach. A flap panel CT was performed of the upper abdomen. This confirmed the balloon in adequate position at the stomach wall. The balloon was then entirely deflated and gained approximately 7-8 cc of fluid. 10 cc of saline was injected to re- inflate the balloon. The  bumper was advanced for good approximation to the scan and a dry dressing was placed. IMPRESSION: Status post drain injection confirming adequate position of the gastrojejunostomy tube. A flap panel CT which was performed demonstrating good approximation of the balloon with the abdominal wall. Complete filling of the balloon to 10 cc of volume was performed. Signed, Dulcy Fanny. Earleen Newport, DO [facility] Radiology PLAN: Should drainage around the site remain to be a problem, I think 1 option would be discuss with the patient a new gastrostomy puncture adjacent to the existing puncture so that the old tube may be removed and the gastropexy can heal/close. Electronically Signed   By: Corrie Mckusick D.O.   On: 09/20/2016 17:18   Ct Liver Abd W/wo  Result Date: 09/05/2016 CLINICAL DATA:  Evaluate liver lesions seen on recent CT scan. History of prior liver cancer status post resection in February 2012. EXAM: CT ABDOMEN WITHOUT AND WITH CONTRAST TECHNIQUE: Multidetector CT imaging of the abdomen was performed following the standard protocol before and following the bolus administration of intravenous contrast. CONTRAST:  126mL ISOVUE-300 IOPAMIDOL (ISOVUE-300) INJECTION 61% COMPARISON:  Noncontrast CT scan 09/04/2016 FINDINGS: Lower chest: Stable emphysematous changes and pulmonary scarring. There is a small left pleural effusion which is unchanged. No basilar pulmonary nodules. Stable cardiac enlargement. Hepatobiliary: Surgical changes from a right hepatectomy. There are 2 enhancing liver lesions in the left hepatic lobe. The more superior lesion, best seen on series 6 image number 28 measures approximately 25 x 20 mm and demonstrates heterogeneous contrast enhancement. I do not see this on the prior CT scan from 2016. The second larger lesion more inferiorly in the left lobe, best seen on series 6, image number 36 measures approximately 29 x 25 mm. On the prior CT scan from  2016 this measured 18 x 14 mm. Findings most consistent with recurrent neoplasm. Pancreas: No mass, inflammation or ductal dilatation. Spleen: Normal size.  No focal lesions. Adrenals/Urinary Tract: The adrenal glands are normal. Stable bilateral renal calculi.  Small renal cysts are noted. Stomach/Bowel: The feeding gastrojejunostomy tube is in good position without complicating features. No obstructive findings. Vascular/Lymphatic: Advanced atherosclerotic calcifications involving the aorta and branch vessels. No focal aneurysm or dissection. The major venous structures are patent. There are small scattered mesenteric and retroperitoneal lymph nodes which are stable. No mass or overt adenopathy. Other: No ascites or abdominal wall hernia. Musculoskeletal: No significant bony findings. Suspect moderately severe multifactorial spinal stenosis at L3-4 and L4-5 IMPRESSION: 1. New and enlarging left hepatic lobe liver lesions consistent with recurrent tumor or metastatic disease. 2.  Small scattered mesenteric and retroperitoneal lymph nodes but no mass or overt adenopathy. 3. Feeding gastrojejunostomy tube in good position without complicating features. 4. Stable advanced atherosclerotic calcifications involving the aorta and branch vessels. 5. Persistent small left pleural effusion and left basilar scarring changes. Electronically Signed   By: Marijo Sanes M.D.   On: 09/05/2016 16:56    Labs:  CBC:  Recent Labs  09/03/16 0527  09/04/16 0415 09/04/16 1552 09/05/16 0429 09/05/16 1607 09/24/16 1112  WBC 7.5  --  6.4  --  5.4  --  4.0  HGB 15.3  15.2  < > 13.0 13.6 12.5* 14.5 14.0  HCT 43.4  43.5  < > 36.8* 38.7* 35.6* 40.4 39.6  PLT 81*  --  76*  --  69*  --  83*  < > = values in this interval not displayed.  COAGS:  Recent Labs  08/31/16 2041 09/24/16 1112  INR 1.08 1.01    BMP:  Recent Labs  08/31/16 2036 09/01/16 0415 09/05/16 0429 09/24/16 1112  NA 142 142 135 141  K 3.5 3.9  3.9 4.5  CL 101 103 104 99*  CO2 33* 31 26 34*  GLUCOSE 163* 148* 193* 117*  BUN 30* 28* 20 35*  CALCIUM 9.1 8.6* 7.7* 9.2  CREATININE 0.89 0.87 0.72 0.82  GFRNONAA >60 >60 >60 >60  GFRAA >60 >60 >60 >60    LIVER FUNCTION TESTS:  Recent Labs  08/31/16 2036 09/01/16 0415 09/24/16 1112  BILITOT 1.9* 1.6* 1.1  AST 37 37 43*  ALT 18 17 25   ALKPHOS 88 84 98  PROT 7.5 7.2 7.9  ALBUMIN 3.8 3.4* 4.1    TUMOR MARKERS:  Recent Labs  09/04/16 0415  AFPTM 3.5    Assessment and Plan:  [name] is a 82 y.o. 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  He was found to have indeterminate liver lesions on CT scan of the abdomen and pelvis performed 09/05/2016 with ultrasound-guided biopsy performed 09/24/2016 confirming recurrent hepatocellular carcinoma.   The patient remains asymptomatic in regards to his recurrent hepatocellular carcinoma.  While the patient is cachectic and very sickly looking, he continues to live alone and remains independent with activities of daily living. He ambulates with a quad cane.  Unfortunately, I do not feel this patient is a candidate percutaneous microwave ablation secondary to his inability to tolerate general anesthesia necessary to allow for a technically successful and safe procedure.  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 There is early reconstitution of the distal aspect of the celiac artery at the level of it's bifurcation however this is secondary to hypertrophy of several pancreaticoduodenal arteries in  lieu of a dominant gastroduodenal artery.  Additionally, the patient has extensive irregular mixed calcified and noncalcified atherosclerotic plaque throughout his abdominal aorta including a short segment, nonflow limiting dissection within the mid aspect of the abdominal aorta.    As such, this patient is also not a candidate for any transcatheter treatment options (either Y-90 or bland embolization).  The lack of any viable percutaneous liver directed treatment options were discussed in detail with the patient and the patient's daughter who demonstrated excellent understanding.  The patient was encouraged to keep all subsequent follow-up appointments with Dr. Burr Medico.  Thank you for this interesting consult.  I greatly enjoyed meeting [name] and look forward to participating in their care.  A copy of this report was sent to the requesting provider on this date.  Electronically Signed: Sandi Mariscal 10/04/2016, 3:25 PM   I spent a total of 20 Minutes in face to face in clinical consultation, greater than 50% of which was counseling/coordinating care for hepatocellular carcinoma

## 2016-10-05 ENCOUNTER — Ambulatory Visit (HOSPITAL_BASED_OUTPATIENT_CLINIC_OR_DEPARTMENT_OTHER): Payer: Medicare Other | Admitting: Hematology

## 2016-10-05 ENCOUNTER — Telehealth: Payer: Self-pay | Admitting: *Deleted

## 2016-10-05 ENCOUNTER — Encounter: Payer: Self-pay | Admitting: Hematology

## 2016-10-05 VITALS — BP 118/55 | HR 55 | Temp 97.6°F | Resp 17 | Ht 69.0 in | Wt 122.7 lb

## 2016-10-05 DIAGNOSIS — D696 Thrombocytopenia, unspecified: Secondary | ICD-10-CM

## 2016-10-05 DIAGNOSIS — J449 Chronic obstructive pulmonary disease, unspecified: Secondary | ICD-10-CM

## 2016-10-05 DIAGNOSIS — I4891 Unspecified atrial fibrillation: Secondary | ICD-10-CM | POA: Diagnosis not present

## 2016-10-05 DIAGNOSIS — K7460 Unspecified cirrhosis of liver: Secondary | ICD-10-CM | POA: Diagnosis not present

## 2016-10-05 DIAGNOSIS — C220 Liver cell carcinoma: Secondary | ICD-10-CM

## 2016-10-05 DIAGNOSIS — E119 Type 2 diabetes mellitus without complications: Secondary | ICD-10-CM

## 2016-10-05 DIAGNOSIS — I10 Essential (primary) hypertension: Secondary | ICD-10-CM

## 2016-10-05 NOTE — Progress Notes (Signed)
[place]  [phone] [phone]  Clinic Follow up Note   Patient Care Team: Conni Slipper, NP as PCP - General (Nurse Practitioner) Leonie Man, MD as Consulting Physician (Cardiology) Carol Ada, MD as Consulting Physician (Gastroenterology) 10/05/2016  CHIEF COMPLAIN: follow up liver lesions   History of present illness (09/04/2016):   82 yo male with PMH of alcohol abuse, liver cancer s/p surgical resection at [facility] in 01/2011, atrial fibrillation, COPD, hypertension, diabetes, and GJ tube because of recurrent aspiration after his cervical spine injury. He presented to the hospital with upper GI bleeding. Workup including CT abdomen and pelvis without contrast showed 2 liver lesions, concerning for recurrent liver cancer.   He lives with his male friend, his daughter lives close to him. I met a post patient and his daughter in the hospital, he is able to take half most his ADLs, still drives, and ambulates with a crane.   CURRENT THERAPY: Pending   INTERVAL HISTORY: Mr. Decoteau returns for follow-up. He was seen by Dr. Pascal Lux but liver targeted therapy (ablation or TACE, Y90) was not offered due to his poor candidacy for general anesthesia and difficult liver vascular structures. He returns to my clinic to discuss other options. He is clinically stable, denies any pain, or other new symptoms.  REVIEW OF SYSTEMS:   Constitutional: Denies fevers, chills or abnormal weight loss, (+) fatigue  Eyes: Denies blurriness of vision Ears, nose, mouth, throat, and face: Denies mucositis or sore throat Respiratory: mild chronic dry cough, no dyspnea or wheezes Cardiovascular: Denies palpitation, chest discomfort or lower extremity swelling Gastrointestinal:  Denies nausea, heartburn or change in bowel habits Skin: Denies abnormal skin rashes Lymphatics: Denies new lymphadenopathy or easy bruising Neurological:Denies numbness, tingling or new  weaknesses Behavioral/Psych: Mood is stable, no new changes  All other systems were reviewed with the patient and are negative.  MEDICAL HISTORY:  Past Medical History:  Diagnosis Date  . Back pain   . Cancer ([place])   . COPD (chronic obstructive pulmonary disease) (HCC)    [place]  . Coronary artery disease   . Dissecting aortic aneurysm, abdominal ([place]) 11/13/2011   Overview:  Severe aortoiliac stenosis with possible a chronic dissection versus  ulcerated plaque/thrombus extending above and below the aortic  bifurcation; without aneurysmal dilation  as per CT abdomen noted 09/25/2011   . DM2 (diabetes mellitus, type 2) ([place])    takes Tradjenta daily  . Dysphagia    s/p PEG placement  . Manuel Garcia (hepatocellular carcinoma) ([place])    s/p right lobe resection 2012  . Hyperlipidemia   . Hypertension   . Hypothyroidism    takes Synthroid daily   . Pacemaker 11/11/2015   St. Jude single chamber pacemaker implanted at [facility] 11/2014.  Marland Kitchen Paroxysmal atrial flutter (HCC)    and fibrillation with known bradycardia, Chads2vasc of 4, not on A/C due to thrombocytopenia and functional status  . Peripheral vascular disease ([place])    LICA stent [id number]  . Pneumonia 11/03/2012   aspiration pneumonias  . Pneumothorax 10/2015  . Presence of permanent cardiac pacemaker   . Severe malnutrition ([place])   . Shortness of breath dyspnea    with exertion   . Thrombocytopenia ([place])     SURGICAL HISTORY: Past Surgical History:  Procedure Laterality Date  . CAROTID ANGIOGRAM  2012  . CAROTID STENT  2002  . cataract surgery      bilateral  . COLONOSCOPY    . DOPPLER ECHOCARDIOGRAPHY  2011  . ESOPHAGOGASTRODUODENOSCOPY  10/01/2012   Procedure: ESOPHAGOGASTRODUODENOSCOPY (EGD);  Surgeon: Gwenyth Ober, MD;  Location: [place];  Service: General;  Laterality: N/A;  wyatt/leone  . ESOPHAGOGASTRODUODENOSCOPY  10/31/2012   Procedure: ESOPHAGOGASTRODUODENOSCOPY (EGD);  Surgeon: Beryle Beams, MD;  Location: [facility]  ENDOSCOPY;  Service: Endoscopy;  Laterality: N/A;  . IR GENERIC HISTORICAL  07/25/2016   IR [place] DUODEN/JEJUNO TUBE PERCUT W/FLUORO 07/25/2016 Markus Daft, MD [facility]-INTERV RAD  . IR GENERIC HISTORICAL  08/30/2016   IR GJ TUBE CHANGE 08/30/2016 Corrie Mckusick, DO [facility]INTER[facility]  . IR GENERIC HISTORICAL  09/04/2016   IR CM INJ ANY COLONIC TUBE W/FLUORO 09/04/2016 [facility]INTER[facility] RAD  . IR GENERIC HISTORICAL  09/20/2016   IR CM INJ ANY COLONIC TUBE W/FLUORO 09/20/2016 Corrie Mckusick, DO [facility]  . IR GENERIC HISTORICAL  09/20/2016   IR CT SPINE LTD 09/20/2016 Corrie Mckusick, DO [facility]  . liver canc    . pacemaker inserted    . PARS PLANA VITRECTOMY Right 11/14/2015   Procedure: PARS PLANA VITRECTOMY WITH 25 GAUGE, WITH ENDOLASER;  Surgeon: Hurman Horn, MD;  Location: [place];  Service: Ophthalmology;  Laterality: Right;  . PEG PLACEMENT  10/01/2012   Procedure: PERCUTANEOUS ENDOSCOPIC GASTROSTOMY (PEG) PLACEMENT;  Surgeon: Gwenyth Ober, MD;  Location: [place];  Service: General;  Laterality: N/A;  . PEG TUBE PLACEMENT  10/12/2015      replacment tube  . POSTERIOR CERVICAL FUSION/FORAMINOTOMY  09/25/2012   Procedure: POSTERIOR CERVICAL FUSION/FORAMINOTOMY LEVEL 2;  Surgeon: Eustace Moore, MD;  Location: [place] NEURO ORS;  Service: Neurosurgery;  Laterality: N/A;  Posterior Cervical three-four laminectomy, posterior cervical three-four, four-five fusion    I have reviewed the social history and family history with the patient and they are unchanged from previous note.  ALLERGIES:  has No Known Allergies.  MEDICATIONS:  Current Outpatient Prescriptions  Medication Sig Dispense Refill  . albuterol (PROVENTIL) (2.5 MG/3ML) 0.083% nebulizer solution Take 3 mLs (2.5 mg total) by nebulization every 6 (six) hours as needed for wheezing or shortness of breath. 75 mL 12  . aspirin EC 81 MG tablet Place 81 mg into feeding tube daily.     . budesonide (PULMICORT) 0.25 MG/2ML nebulizer solution Take 0.25 mg by  nebulization every 6 (six) hours.     . cyproheptadine (PERIACTIN) 4 MG tablet Place 4 mg into feeding tube daily.     . ergocalciferol (VITAMIN D2) 50000 units capsule Take 50,000 Units by mouth once a week.    . levothyroxine (SYNTHROID, LEVOTHROID) 88 MCG tablet Take 88 mcg by mouth daily.    Marland Kitchen linagliptin (TRADJENTA) 5 MG TABS tablet Place 5 mg into feeding tube daily.     . Nutritional Supplements (FEEDING SUPPLEMENT, JEVITY 1.5 CAL,) LIQD Place 1,100 mLs into feeding tube continuous. At bedtime at a rate of 191m/hr.    . Nutritional Supplements (FEEDING SUPPLEMENT, OSMOLITE 1.5 CAL,) LIQD Place 1,000 mLs into feeding tube continuous.    .Marland KitchenoxyCODONE (ROXICODONE) 15 MG immediate release tablet Place 7.5-15 mg into feeding tube See admin instructions. Take 1/2 - 1 tablet by feeding tube twice daily (depending on pain level), may take an additional 1/2 -1 tablet at noon if needed for pain  0  . polyethylene glycol (MIRALAX / GLYCOLAX) packet Place 8.5 g into feeding tube 2 (two) times daily.     .Marland Kitchentiotropium (SPIRIVA) 18 MCG inhalation capsule Place 18 mcg into inhaler and inhale 2 (two) times daily.  No current facility-administered medications for this visit.     PHYSICAL EXAMINATION: ECOG PERFORMANCE STATUS: 2  Vitals:   10/05/16 1003  BP: (!) 118/55  Pulse: (!) 55  Resp: 17  Temp: 97.6 F (36.4 C)   Filed Weights   10/05/16 1003  Weight: 122 lb 11.2 oz (55.7 kg)    GENERAL:alert, no distress and comfortable SKIN: skin color, texture, turgor are normal, no rashes or significant lesions EYES: normal, Conjunctiva are pink and non-injected, sclera clear OROPHARYNX:no exudate, no erythema and lips, buccal mucosa, and tongue normal  NECK: supple, thyroid normal size, non-tender, without nodularity LYMPH:  no palpable lymphadenopathy in the cervical, axillary or inguinal LUNGS: clear to auscultation and percussion with normal breathing effort HEART: regular rate & rhythm and  no murmurs and no lower extremity edema ABDOMEN:abdomen soft, non-tender and normal bowel sounds, (+) feeding tube in mid abdomen.  Musculoskeletal:no cyanosis of digits and no clubbing  NEURO: alert & oriented x 3 with fluent speech, no focal motor/sensory deficits  LABORATORY DATA:  I have reviewed the data as listed CBC Latest Ref Rng & Units 09/24/2016 09/05/2016 09/05/2016  WBC 4.0 - 10.5 K/uL 4.0 - 5.4  Hemoglobin 13.0 - 17.0 g/dL 14.0 14.5 12.5(L)  Hematocrit 39.0 - 52.0 % 39.6 40.4 35.6(L)  Platelets 150 - 400 K/uL 83(L) - 69(L)     CMP Latest Ref Rng & Units 09/24/2016 09/05/2016 09/01/2016  Glucose 65 - 99 mg/dL 117(H) 193(H) 148(H)  BUN 6 - 20 mg/dL 35(H) 20 28(H)  Creatinine 0.61 - 1.24 mg/dL 0.82 0.72 0.87  Sodium 135 - 145 mmol/L 141 135 142  Potassium 3.5 - 5.1 mmol/L 4.5 3.9 3.9  Chloride 101 - 111 mmol/L 99(L) 104 103  CO2 22 - 32 mmol/L 34(H) 26 31  Calcium 8.9 - 10.3 mg/dL 9.2 7.7(L) 8.6(L)  Total Protein 6.5 - 8.1 g/dL 7.9 - 7.2  Total Bilirubin 0.3 - 1.2 mg/dL 1.1 - 1.6(H)  Alkaline Phos 38 - 126 U/L 98 - 84  AST 15 - 41 U/L 43(H) - 37  ALT 17 - 63 U/L 25 - 17   PATHOLOGY REPORT  Diagnosis 09/24/2016 Liver, needle/core biopsy, indeterminate lesion in central aspect of the liver - HEPATOCELLULAR CARCINOMA. - SEE COMMENT. Microscopic Comment Dr. Claudette Laws has reviewed the case and concurs with this interpretation. Dr. Burr Medico was paged on 09/25/16. (JBK:gt, 09/25/16)    RADIOGRAPHIC STUDIES: I have personally reviewed the radiological images as listed and agreed with the findings in the report. No results found.   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 He presented to the hospital with upper GI bleeding. Workup including CT abdomen and pelvis without contrast showed  2 liver lesions, concerning for recurrent liver cancer.   1. Recurrent hepatocellular carcinoma,  -His CT abdomen from 06/21/2015 showed a 1.9cm low attenuation lesion within the left lobe of liver, this has increased to 2.3 cm on his recent CT scan from 09/02/2016, and a second new lesion in the left lobe measuring 2.2 cm. I have reviewed his CT scans, this liver lesions are suspicious for recurrent liver cancer.  -His AFP from a few days ago was normal  -I reviewed his liver biopsy results, it confirmed [place]. -I discussed possible treatment options for recurrent liver cancer, given his advanced age and  comorbidities, he would not be a candidate for surgery or transplant. He was seen by interventional radiologist Dr. Pascal Lux, unfortunately liver ablation or TACE/Y90 are not feasible due to his poor candidacy for general anesthesia and difficult rescue structure in the liver -I'll refer him to radiation oncologist Dr. Lisbeth Renshaw to see if he is a candidate for SBRT -If S/P RT is not feasible, I would offer him immunotherapy with Nivolumab, which was recently approved for [facility]. Due to his comorbidities, especially chronic aspiration, I think he is a poor candidate for sorafenib. Potential benefits and side effects of this to systemic therapy were discussed with patient and his daughter, written material of Nivolumab was given to her. They will think about it.  2. Liver cirrhosis, secondary to alcohol -He has quit drinking alcohol long time ago -His liver function is compensated, no significant complications.  3. Thrombocytopenia -Likely second to his liver cirrhosis -We'll continue monitoring  4. atrial fibrillation, COPD, hypertension, diabetes, and GJ tube -Follow up with his primary care physician and other specialist  Recommendations: -Radiation oncology referral to Dr. Lisbeth Renshaw, to consider SBRT -I'll see him back after he completes SBRT or sooner if he is not a candidate for SBRT  All questions  were answered. The patient knows to call the clinic with any problems, questions or concerns. No barriers to learning was detected.  I spent 25 minutes counseling the patient face to face. The total time spent in the appointment was 30 minutes and more than 50% was on counseling and review of test results     Truitt Merle, MD 10/05/16

## 2016-10-05 NOTE — Telephone Encounter (Signed)
Notified daughter that Dr Burr Medico spoke with Dr Lisbeth Renshaw & Rad Onc should call her for an appt & asked her to call us back after pt sees Dr Lisbeth Renshaw & we will determine when he needs to return to see [place].  Sharyn Lull expressed understanding.

## 2016-10-08 ENCOUNTER — Ambulatory Visit
Admission: RE | Admit: 2016-10-08 | Discharge: 2016-10-08 | Disposition: A | Discharge status: Home / Self Care | Payer: Medicare Other | Source: Ambulatory Visit | Attending: Radiation Oncology | Admitting: Radiation Oncology

## 2016-10-08 ENCOUNTER — Encounter: Payer: Self-pay | Admitting: Radiation Oncology

## 2016-10-08 DIAGNOSIS — E039 Hypothyroidism, unspecified: Secondary | ICD-10-CM | POA: Insufficient documentation

## 2016-10-08 DIAGNOSIS — I2510 Atherosclerotic heart disease of native coronary artery without angina pectoris: Secondary | ICD-10-CM | POA: Insufficient documentation

## 2016-10-08 DIAGNOSIS — Z981 Arthrodesis status: Secondary | ICD-10-CM | POA: Diagnosis not present

## 2016-10-08 DIAGNOSIS — I10 Essential (primary) hypertension: Secondary | ICD-10-CM | POA: Insufficient documentation

## 2016-10-08 DIAGNOSIS — Z931 Gastrostomy status: Secondary | ICD-10-CM | POA: Insufficient documentation

## 2016-10-08 DIAGNOSIS — E119 Type 2 diabetes mellitus without complications: Secondary | ICD-10-CM | POA: Insufficient documentation

## 2016-10-08 DIAGNOSIS — E1151 Type 2 diabetes mellitus with diabetic peripheral angiopathy without gangrene: Secondary | ICD-10-CM | POA: Insufficient documentation

## 2016-10-08 DIAGNOSIS — Z7982 Long term (current) use of aspirin: Secondary | ICD-10-CM | POA: Diagnosis not present

## 2016-10-08 DIAGNOSIS — E785 Hyperlipidemia, unspecified: Secondary | ICD-10-CM | POA: Insufficient documentation

## 2016-10-08 DIAGNOSIS — Z950 Presence of cardiac pacemaker: Secondary | ICD-10-CM | POA: Diagnosis not present

## 2016-10-08 DIAGNOSIS — Z809 Family history of malignant neoplasm, unspecified: Secondary | ICD-10-CM | POA: Insufficient documentation

## 2016-10-08 DIAGNOSIS — C220 Liver cell carcinoma: Secondary | ICD-10-CM

## 2016-10-08 DIAGNOSIS — Z79899 Other long term (current) drug therapy: Secondary | ICD-10-CM | POA: Insufficient documentation

## 2016-10-08 DIAGNOSIS — J449 Chronic obstructive pulmonary disease, unspecified: Secondary | ICD-10-CM | POA: Diagnosis not present

## 2016-10-08 DIAGNOSIS — Z87891 Personal history of nicotine dependence: Secondary | ICD-10-CM | POA: Insufficient documentation

## 2016-10-08 DIAGNOSIS — Z7951 Long term (current) use of inhaled steroids: Secondary | ICD-10-CM | POA: Insufficient documentation

## 2016-10-08 NOTE — Progress Notes (Signed)
Please see the Nurse Progress Note in the MD Initial Consult Encounter for this patient. 

## 2016-10-08 NOTE — Progress Notes (Addendum)
GI Location of Tumor / Histology: Liver  [name] presented months ago with symptoms of: nausea, throwing up blood  Biopsies of  (if applicable) revealed:Diagnosis 09/24/16 Liver, needle/core biopsy, indeterminate lesion in central aspect of the liver - HEPATOCELLULAR CARCINOMA.  Past/Anticipated interventions by surgeon, if any:   Past/Anticipated interventions by medical oncology, if any:  Weight changes, if any: loss several pounds gaining back now with osmolite 1.25 via peg tube  Bowel/Bladder complaints, if any: bowels regular, nocturia 2-3x night  Nausea / Vomiting, if any: peg tube replaced 08/30/16, peg tube draining, and crusty  Pain issues, if any: none at present, pain in neck and back at times  Any blood per rectum:   No  SAFETY ISSUES: yes,   Prior radiation?No}  Pacemaker/ICD? {:Yes, left subclavian  Is the patient on methotrexate? No  Current Complaints/Details:Widowed,  2 daughters, 1 passed away,  Appt with [facility] Oct 18/2017 at 2pm Dr. Corliss Marcus DahhanMD .j BP (!) 108/45 (BP Location: Left Arm, Patient Position: Sitting, Cuff Size: Normal)   Pulse (!) 48   Temp 98.2 F (36.8 C) (Oral)   Resp 20   Ht 5\' 9"  (1.753 m)   Wt 121 lb (54.9 kg)   SpO2 91% Comment: room air  BMI 17.87 kg/m   Wt Readings from Last 3 Encounters:  10/08/16 121 lb (54.9 kg)  10/05/16 122 lb 11.2 oz (55.7 kg)  10/04/16 120 lb (54.4 kg)

## 2016-10-08 NOTE — Addendum Note (Signed)
Encounter addended by: Kyung Rudd, MD on: 10/08/2016  7:14 PM<BR>    Actions taken: Sign clinical note

## 2016-10-08 NOTE — Progress Notes (Addendum)
Radiation Oncology         ([phone]) [phone] ________________________________  Name: [name] MRN: [id number]  Date: 10/08/2016  DOB: [birth year]  JQ:[id number],TKWIOXBDZ, NP  Truitt Merle, MD     REFERRING PHYSICIAN: Truitt Merle, MD   DIAGNOSIS: The encounter diagnosis was [facility] (hepatocellular carcinoma) ([place]).   Recurrent hepatocellular carcinoma  HISTORY OF PRESENT ILLNESS::[name] is a 82 y.o. male who is seen for an initial consultation visit regarding the patient's diagnosis of liver cancer.  The patient has a prior medical history of alcohol abuse and liver cancer s/p right hepatectomy at [facility] in 01/2011 for well differentiated hepatocelluar carcinoma.  The patient presented to the ED and admitted to the hospital on 08/31/16 for nausea and emesis. The patient was worried that he had hematemesis. He had his PEG tube replaced the day before for being clogged. His gastric contents were sent for gastro-occult and were positive. The patient was admitted to the hospital with serial hemoglobins and IV Protonix.  CT of the a/p was performed on 09/02/16 to evaluate the placement of the PEG tube. This showed two low-attenuation lesions in the left hepatic lobe measuring 2.3 x 1.9 cm (that previously measured 1.9 x 1.4 cm) and  2.2 x 2.0 cm. This was concerning for recurrent liver cancer. CT of the chest on 09/04/16 showed stable right upper lobe nodules, emphysematous changes, and 2 lateral segment left lobe hepatic lesions.  The patient met with Dr. Burr Medico on 09/04/16. The patient has a pacemaker and is unable to undergo an MRI scan. She recommended a CT of the abdomen w/ w/o contrast (liver protocol) to further evaluate the liver lesions and to present his case in the GI tumor board to see if biopsy was necessary. Given his advanced age and comorbidities, the patient is not a candidate for surgery or transplant.  CT of the liver on 09/05/16 showed 2 enhancing liver lesions in the left hepatic lobe (the  more superior lesion measuring 2.5 x 2.0 cm and the second larger lesion more inferiorly measures 2.9 x 2.5 cm) and small scattered mesenteric and retroperitoneal lymph nodes but no mass or overt adenopathy. The lesions are consistent with recurrent tumor or metastatic disease.  The patient was discharged from the hospital on 09/05/16.  On 09/24/16, the patient underwent an US guided biopsy of an indeterminate lesion in the central aspect of the liver by Dr. Pascal Lux. This revealed hepatocelluar carcinoma.  On 10/04/16, the patient met with Dr. Pascal Lux who does not believe the patient is a candidate for percutaneous microwave ablation and he is also not a candidate for any transcatheter treatment options (either Y-90 or bland embolization).  The patient followed up with Dr. Burr Medico on 10/05/16, who discussed systemic treatment options if radiation therapy was not feasible.  The patient and his daughter present today to discuss the role of radiation in the management of his disease.  PREVIOUS RADIATION THERAPY: No   PAST MEDICAL HISTORY:  has a past medical history of Back pain; Cancer [facility]); COPD (chronic obstructive pulmonary disease) ([place]); Coronary artery disease; Dissecting aortic aneurysm, abdominal ([place]) (11/13/2011); DM2 (diabetes mellitus, type 2) ([place]); Dysphagia; [place] (hepatocellular carcinoma) ([place]); Hyperlipidemia; Hypertension; Hypothyroidism; Pacemaker (11/11/2015); Paroxysmal atrial flutter ([place]); Peripheral vascular disease ([place]); Pneumonia (11/03/2012); Pneumothorax (10/2015); Presence of permanent cardiac pacemaker; Severe malnutrition ([place]); Shortness of breath dyspnea; and Thrombocytopenia ([place]).     PAST SURGICAL HISTORY: Past Surgical History:  Procedure Laterality Date  . CAROTID ANGIOGRAM  2012  .  CAROTID STENT  2002  . cataract surgery      bilateral  . COLONOSCOPY    . DOPPLER ECHOCARDIOGRAPHY  2011  . ESOPHAGOGASTRODUODENOSCOPY  10/01/2012   Procedure:  ESOPHAGOGASTRODUODENOSCOPY (EGD);  Surgeon: Gwenyth Ober, MD;  Location: [facility];  Service: General;  Laterality: N/A;  wyatt/leone  . ESOPHAGOGASTRODUODENOSCOPY  10/31/2012   Procedure: ESOPHAGOGASTRODUODENOSCOPY (EGD);  Surgeon: Beryle Beams, MD;  Location[facility] ENDOSCOPY;  Service: Endoscopy;  Laterality: N/A;  . IR GENERIC HISTORICAL  07/25/2016   IR [place] DUODEN/JEJUNO TUBE PERCUT W/FLUORO 07/25/2016 Markus Daft, MD [facility]INTER[facility]  . IR GENERIC HISTORICAL  08/30/2016   IR GJ TUBE CHANGE 08/30/2016 Corrie Mckusick, DO [facility]  . IR GENERIC HISTORICAL  09/04/2016   IR CM INJ ANY COLONIC TUBE W/FLUORO 09/04/2016 [facility]INTER[facility]  . IR GENERIC HISTORICAL  09/20/2016   IR CM INJ ANY COLONIC TUBE W/FLUORO 09/20/2016 Corrie Mckusick, DO [facility]  . IR GENERIC HISTORICAL  09/20/2016   IR CT SPINE LTD 09/20/2016 Corrie Mckusick, DO [facility]  . liver canc    . pacemaker inserted    . PARS PLANA VITRECTOMY Right 11/14/2015   Procedure: PARS PLANA VITRECTOMY WITH 25 GAUGE, WITH ENDOLASER;  Surgeon: Hurman Horn, MD;  Location: [place];  Service: Ophthalmology;  Laterality: Right;  . PEG PLACEMENT  10/01/2012   Procedure: PERCUTANEOUS ENDOSCOPIC GASTROSTOMY (PEG) PLACEMENT;  Surgeon: Gwenyth Ober, MD;  Location: [place];  Service: General;  Laterality: N/A;  . PEG TUBE PLACEMENT  10/12/2015      replacment tube  . POSTERIOR CERVICAL FUSION/FORAMINOTOMY  09/25/2012   Procedure: POSTERIOR CERVICAL FUSION/FORAMINOTOMY LEVEL 2;  Surgeon: Eustace Moore, MD;  Location: [place] [facility];  Service: Neurosurgery;  Laterality: N/A;  Posterior Cervical three-four laminectomy, posterior cervical three-four, four-five fusion     FAMILY HISTORY: family history includes Cancer in his father; Liver disease in his brother.   SOCIAL HISTORY:  reports that he quit smoking about 5 years ago. His smoking use included Cigarettes. He has a 55.00 pack-year smoking history. He has never used smokeless tobacco. He reports that  he does not drink alcohol or use drugs.   ALLERGIES: Review of patient's allergies indicates no known allergies.   MEDICATIONS:  Current Outpatient Prescriptions  Medication Sig Dispense Refill  . albuterol (PROVENTIL) (2.5 MG/3ML) 0.083% nebulizer solution Take 3 mLs (2.5 mg total) by nebulization every 6 (six) hours as needed for wheezing or shortness of breath. 75 mL 12  . aspirin EC 81 MG tablet Place 81 mg into feeding tube daily.     . budesonide (PULMICORT) 0.25 MG/2ML nebulizer solution Take 0.25 mg by nebulization every 6 (six) hours.     . cyproheptadine (PERIACTIN) 4 MG tablet Place 4 mg into feeding tube daily.     . ergocalciferol (VITAMIN D2) 50000 units capsule Take 50,000 Units by mouth once a week.    . levothyroxine (SYNTHROID, LEVOTHROID) 88 MCG tablet Take 88 mcg by mouth daily.    Marland Kitchen linagliptin (TRADJENTA) 5 MG TABS tablet Place 5 mg into feeding tube daily.     . Nutritional Supplements (FEEDING SUPPLEMENT, OSMOLITE 1.5 CAL,) LIQD Place 1,000 mLs into feeding tube continuous.    Marland Kitchen oxyCODONE (ROXICODONE) 15 MG immediate release tablet Place 7.5-15 mg into feeding tube See admin instructions. Take 1/2 - 1 tablet by feeding tube twice daily (depending on pain level), may take an additional 1/2 -1 tablet at noon if needed for pain  0  . polyethylene glycol (MIRALAX / GLYCOLAX) packet Place 8.5 g into feeding tube daily.     Marland Kitchen tiotropium (SPIRIVA) 18 MCG inhalation capsule Place 18 mcg into inhaler and inhale 2 (two) times daily.     No current facility-administered medications for this encounter.      REVIEW OF SYSTEMS:  A 15 point review of systems is documented in the electronic medical record. This was obtained by the nursing staff. However, I reviewed this with the patient to discuss relevant findings and make appropriate changes.  Pertinent items noted in HPI and remainder of comprehensive ROS otherwise negative.  The patient is supplementing his nutrition with  Osmolite 1.25 via PEG tube, regular bowels, fatigue, and nocturia, x2-3. Denies pain at this time, but reports pain in the neck and back at times. Pacemaker in the left subclavian.  PHYSICAL EXAM:  height is '5\' 9"'  (1.753 m) and weight is 121 lb (54.9 kg). His oral temperature is 98.2 F (36.8 C). His blood pressure is 108/45 (abnormal) and his pulse is 48 (abnormal). His respiration is 20 and oxygen saturation is 91%.   General: Alert and oriented, in no acute distress. Very thin, elderly appearing male. HEENT: Head is normocephalic. Extraocular movements are intact. Oropharynx is clear. Edentulous. Neck: Neck is supple, no palpable cervical or supraclavicular lymphadenopathy. Heart: Regular in rate and rhythm with no murmurs, rubs, or gallops. Chest: Clear to auscultation bilaterally, with no rhonchi, wheezes, or rales. Abdomen: PEG tube in place with crusting around that area. No sign of infection. Extremities: No cyanosis or edema. Lymphatics: see Neck Exam Skin: No concerning lesions. Musculoskeletal: symmetric strength and muscle tone throughout. Presents in a wheelchair. Neurologic: Cranial nerves II through XII are grossly intact. No obvious focalities. Speech is fluent. Coordination is intact. Psychiatric: Judgment and insight are intact. Affect is appropriate.  ECOG = 2  0 - Asymptomatic (Fully active, able to carry on all predisease activities without restriction)  1 - Symptomatic but completely ambulatory (Restricted in physically strenuous activity but ambulatory and able to carry out work of a light or sedentary nature. For example, light housework, office work)  2 - Symptomatic, <50% in bed during the day (Ambulatory and capable of all self care but unable to carry out any work activities. Up and about more than 50% of waking hours)  3 - Symptomatic, >50% in bed, but not bedbound (Capable of only limited self-care, confined to bed or chair 50% or more of waking hours)  4 -  Bedbound (Completely disabled. Cannot carry on any self-care. Totally confined to bed or chair)  5 - Death   Eustace Pen MM, Creech RH, Tormey DC, et al. [phone]). "Toxicity and response criteria of the [facility]". [place] Oncol. 5 (6): 649-55     LABORATORY DATA:  Lab Results  Component Value Date   WBC 4.0 09/24/2016   HGB 14.0 09/24/2016   HCT 39.6 09/24/2016   MCV 95.4 09/24/2016   PLT 83 (L) 09/24/2016   Lab Results  Component Value Date   NA 141 09/24/2016   K 4.5 09/24/2016   CL 99 (L) 09/24/2016   CO2 34 (H) 09/24/2016   Lab Results  Component Value Date   ALT 25 09/24/2016   AST 43 (H) 09/24/2016   ALKPHOS 98 09/24/2016   BILITOT 1.1 09/24/2016      RADIOGRAPHY: US Biopsy  Result Date: 09/24/2016 INDICATION: Remote history of HCC, now with ill-defined liver lesions worrisome for  disease recurrence. Please perform ultrasound-guided liver lesion biopsy. EXAM: ULTRASOUND GUIDED LIVER LESION BIOPSY COMPARISON:  Cirrhotic protocol abdominal CT - 09/05/2016 MEDICATIONS: None ANESTHESIA/SEDATION: Fentanyl 50 mcg IV; Versed 2 mg IV Total Moderate Sedation time: 15 minutes. The patient's level of consciousness and vital signs were monitored continuously by radiology nursing throughout the procedure under my direct supervision. COMPLICATIONS: None immediate. PROCEDURE: Informed written consent was obtained from the patient after a discussion of the risks, benefits and alternatives to treatment. The patient understands and consents the procedure. A timeout was performed prior to the initiation of the procedure. Ultrasound scanning was performed of the right upper abdominal quadrant demonstrates an ill-defined approximately 2.2 x 1.4 cm lesion within the central aspect of the lateral segment of the left lobe of the liver compatible with the dominant central lesion seen on preceding CT scan image 29, series 5). This lesion was targeted for biopsy as the additional  lesion within the subcapsular aspect of the left lobe the liver was found to be immediately superficial to the abdominal aorta. The procedure was planned. The midline of the upper abdomen was prepped and draped in the usual sterile fashion. The overlying soft tissues were anesthetized with 1% lidocaine with epinephrine. A 17 gauge, 6.8 cm co-axial needle was advanced into a peripheral aspect of the lesion. This was followed by 3 core biopsies with an 18 gauge core device under direct ultrasound guidance. The coaxial needle tract was embolized with a small amount of Gel-Foam slurry and superficial hemostasis was obtained with manual compression. Post procedural scanning was negative for definitive area of hemorrhage or additional complication. A dressing was placed. The patient tolerated the procedure well without immediate post procedural complication. IMPRESSION: Technically successful ultrasound guided core needle biopsy of dominant ill-defined lesion within the central aspect of the lateral segment of the left lobe of the liver. Electronically Signed   By: Sandi Mariscal M.D.   On: 09/24/2016 14:52   Ir Cm Inj Any Colonic Tube W/fluoro  Result Date: 09/20/2016 INDICATION: 82 year old male with a history of dysphagia, with percutaneous gastrojejunostomy. The tube has been ingested 09/04/2016. This was after exchange of the tube 08/30/2016 for and occluded tube. CT 09/05/2016 performed for surveillance of his metastatic disease demonstrates adequate position of the tube. Today he returns with drainage around the tube. EXAM: GI TUBE INJECTION; IR CT SPINE LIMITED MEDICATIONS: None ANESTHESIA/SEDATION: None CONTRAST:  20 cc - administered into the gastric lumen. FLUOROSCOPY TIME:  Fluoroscopy Time: 0 minutes 54 seconds (118 mGy). COMPLICATIONS: None PROCEDURE: Informed written consent was obtained from the patient after a thorough discussion of the procedural risks, benefits and alternatives. All questions were  addressed. Personal protective gear was used. A timeout was performed prior to the initiation of the procedure. Patient positioned supine position on the fluoroscopy table. Injection of the jejunostomy port can the gastrostomy port confirmed adequate location in the stomach. A flap panel CT was performed of the upper abdomen. This confirmed the balloon in adequate position at the stomach wall. The balloon was then entirely deflated and gained approximately 7-8 cc of fluid. 10 cc of saline was injected to re- inflate the balloon. The bumper was advanced for good approximation to the scan and a dry dressing was placed. IMPRESSION: Status post drain injection confirming adequate position of the gastrojejunostomy tube. A flap panel CT which was performed demonstrating good approximation of the balloon with the abdominal wall. Complete filling of the balloon to 10 cc of volume was performed.  Signed, Dulcy Fanny. Earleen [place], DO [facility] Radiology PLAN: Should drainage around the site remain to be a problem, I think 1 option would be discuss with the patient a new gastrostomy puncture adjacent to the existing puncture so that the old tube may be removed and the gastropexy can heal/close. Electronically Signed   By: Corrie Mckusick D.O.   On: 09/20/2016 17:18   [place]  Result Date: 09/20/2016 INDICATION: 82 year old male with a history of dysphagia, with percutaneous gastrojejunostomy. The tube has been ingested 09/04/2016. This was after exchange of the tube 08/30/2016 for and occluded tube. CT 09/05/2016 performed for surveillance of his metastatic disease demonstrates adequate position of the tube. Today he returns with drainage around the tube. EXAM: GI TUBE INJECTION; IR CT SPINE LIMITED MEDICATIONS: None ANESTHESIA/SEDATION: None CONTRAST:  20 cc - administered into the gastric lumen. FLUOROSCOPY TIME:  Fluoroscopy Time: 0 minutes 54 seconds (118 mGy).  COMPLICATIONS: None PROCEDURE: Informed written consent was obtained from the patient after a thorough discussion of the procedural risks, benefits and alternatives. All questions were addressed. Personal protective gear was used. A timeout was performed prior to the initiation of the procedure. Patient positioned supine position on the fluoroscopy table. Injection of the jejunostomy port can the gastrostomy port confirmed adequate location in the stomach. A flap panel CT was performed of the upper abdomen. This confirmed the balloon in adequate position at the stomach wall. The balloon was then entirely deflated and gained approximately 7-8 cc of fluid. 10 cc of saline was injected to re- inflate the balloon. The bumper was advanced for good approximation to the scan and a dry dressing was placed. IMPRESSION: Status post drain injection confirming adequate position of the gastrojejunostomy tube. A flap panel CT which was performed demonstrating good approximation of the balloon with the abdominal wall. Complete filling of the balloon to 10 cc of volume was performed. Signed, Dulcy Fanny. Earleen [place], DO [facility] Radiology PLAN: Should drainage around the site remain to be a problem, I think 1 option would be discuss with the patient a new gastrostomy puncture adjacent to the existing puncture so that the old tube may be removed and the gastropexy can heal/close. Electronically Signed   By: Corrie Mckusick D.O.   On: 09/20/2016 17:18       IMPRESSION: Recurrent hepatocellular carcinoma. The patient currently is not stage. He has recurrent disease and we will try to obtain additional information regarding his original staging at the time of surgical resection at [facility] in 2012.  Status post resection in 2012. The patient has no sign of additional regional or distant disease at this time. The patient is a good candidate for local therapy. However, he is not a  good candidate for ablation or Y90 treatment. The patient appears to potentially be a good candidate for SBRT. We won't know exactly what does could be directed to the tumor until treatment planning occurs. I believe that a significant dose can be delivered.  Today, I talked to the patient and family about the findings and work-up thus far.  We discussed the natural history of hepatocellular carcinoma and general treatment, highlighting the role of radiotherapy in the management.  We discussed the available radiation techniques, and focused on the details of logistics and delivery.  We reviewed the anticipated acute and late sequelae associated with radiation in this setting.  The patient and his daughter were encouraged to ask  questions that I answered to the best of my ability.  The patient would like to proceed with radiation.  PLAN: We will proceed with CT simulation at a later date.   ________________________________   Jodelle Gross, MD, PhD  This document serves as a record of services personally performed by Kyung Rudd, MD. It was created on his behalf by Darcus Austin, a trained medical scribe. The creation of this record is based on the scribe's personal observations and the provider's statements to them. This document has been checked and approved by the attending provider.

## 2016-10-25 NOTE — Progress Notes (Signed)
Does patient have an allergy to IV contrast dye?: No.   Has patient ever received premedication for IV contrast dye?: No.   Does patient take metformin?: No.Takes Tradjenta   If patient does take metformin when was the last dose: patient takes trandenta (did not take dose this morning, last dose 10/26)  Date of lab work: 09/24/16  Ok to start IV per Dr. Apolinar Junes BUN: 35 CR: 0.82  IV site: Left arm   BP (!) 128/55   Pulse 84   Temp 97.8 F (36.6 C)   Ht 5\' 9"  (1.753 m)   Wt 123 lb 4.8 oz (55.9 kg)   SpO2 96% Comment: room air  BMI 18.21 kg/m

## 2016-10-26 ENCOUNTER — Ambulatory Visit
Admission: RE | Admit: 2016-10-26 | Discharge: 2016-10-26 | Disposition: A | Discharge status: Home / Self Care | Payer: Medicare Other | Source: Ambulatory Visit | Attending: Radiation Oncology | Admitting: Radiation Oncology

## 2016-10-26 ENCOUNTER — Telehealth: Payer: Self-pay | Admitting: Oncology

## 2016-10-26 VITALS — BP 128/55 | HR 84 | Temp 97.8°F | Ht 69.0 in | Wt 123.3 lb

## 2016-10-26 DIAGNOSIS — C220 Liver cell carcinoma: Secondary | ICD-10-CM

## 2016-10-26 MED ORDER — SODIUM CHLORIDE 0.9 % IJ SOLN
10.0000 mL | Freq: Once | INTRAMUSCULAR | Status: AC
  Administered 2016-10-26: 10 mL via INTRAVENOUS

## 2016-10-26 NOTE — Progress Notes (Signed)
.  nurse

## 2016-10-26 NOTE — Telephone Encounter (Signed)
Left a message with patient's daughter, Sharyn Lull, that [place] can start taking his Tradjenta today and does not need lab work on Monday.

## 2016-11-02 ENCOUNTER — Emergency Department (HOSPITAL_COMMUNITY): Payer: Medicare Other

## 2016-11-02 ENCOUNTER — Inpatient Hospital Stay (HOSPITAL_COMMUNITY)
Admission: EM | Admit: 2016-11-02 | Discharge: 2016-11-11 | DRG: 871 | Disposition: A | Discharge status: Hospice | Payer: Medicare Other | Attending: Internal Medicine | Admitting: Internal Medicine

## 2016-11-02 ENCOUNTER — Encounter (HOSPITAL_COMMUNITY): Payer: Self-pay

## 2016-11-02 ENCOUNTER — Telehealth: Payer: Self-pay | Admitting: Hematology

## 2016-11-02 DIAGNOSIS — F419 Anxiety disorder, unspecified: Secondary | ICD-10-CM | POA: Diagnosis not present

## 2016-11-02 DIAGNOSIS — R1312 Dysphagia, oropharyngeal phase: Secondary | ICD-10-CM | POA: Diagnosis present

## 2016-11-02 DIAGNOSIS — J189 Pneumonia, unspecified organism: Secondary | ICD-10-CM | POA: Diagnosis present

## 2016-11-02 DIAGNOSIS — Z66 Do not resuscitate: Secondary | ICD-10-CM | POA: Diagnosis not present

## 2016-11-02 DIAGNOSIS — Z95820 Peripheral vascular angioplasty status with implants and grafts: Secondary | ICD-10-CM

## 2016-11-02 DIAGNOSIS — L8990 Pressure ulcer of unspecified site, unspecified stage: Secondary | ICD-10-CM | POA: Insufficient documentation

## 2016-11-02 DIAGNOSIS — R1013 Epigastric pain: Secondary | ICD-10-CM

## 2016-11-02 DIAGNOSIS — J449 Chronic obstructive pulmonary disease, unspecified: Secondary | ICD-10-CM | POA: Diagnosis present

## 2016-11-02 DIAGNOSIS — A419 Sepsis, unspecified organism: Principal | ICD-10-CM | POA: Diagnosis present

## 2016-11-02 DIAGNOSIS — E785 Hyperlipidemia, unspecified: Secondary | ICD-10-CM | POA: Diagnosis present

## 2016-11-02 DIAGNOSIS — T827XXA Infection and inflammatory reaction due to other cardiac and vascular devices, implants and grafts, initial encounter: Secondary | ICD-10-CM | POA: Diagnosis present

## 2016-11-02 DIAGNOSIS — E43 Unspecified severe protein-calorie malnutrition: Secondary | ICD-10-CM | POA: Diagnosis present

## 2016-11-02 DIAGNOSIS — C229 Malignant neoplasm of liver, not specified as primary or secondary: Secondary | ICD-10-CM | POA: Diagnosis present

## 2016-11-02 DIAGNOSIS — F05 Delirium due to known physiological condition: Secondary | ICD-10-CM | POA: Diagnosis not present

## 2016-11-02 DIAGNOSIS — I482 Chronic atrial fibrillation, unspecified: Secondary | ICD-10-CM | POA: Diagnosis present

## 2016-11-02 DIAGNOSIS — I2510 Atherosclerotic heart disease of native coronary artery without angina pectoris: Secondary | ICD-10-CM | POA: Diagnosis present

## 2016-11-02 DIAGNOSIS — R1084 Generalized abdominal pain: Secondary | ICD-10-CM

## 2016-11-02 DIAGNOSIS — R627 Adult failure to thrive: Secondary | ICD-10-CM | POA: Diagnosis present

## 2016-11-02 DIAGNOSIS — R64 Cachexia: Secondary | ICD-10-CM | POA: Diagnosis present

## 2016-11-02 DIAGNOSIS — Z950 Presence of cardiac pacemaker: Secondary | ICD-10-CM | POA: Diagnosis present

## 2016-11-02 DIAGNOSIS — J9621 Acute and chronic respiratory failure with hypoxia: Secondary | ICD-10-CM | POA: Diagnosis present

## 2016-11-02 DIAGNOSIS — I10 Essential (primary) hypertension: Secondary | ICD-10-CM | POA: Diagnosis present

## 2016-11-02 DIAGNOSIS — I330 Acute and subacute infective endocarditis: Secondary | ICD-10-CM | POA: Diagnosis present

## 2016-11-02 DIAGNOSIS — R410 Disorientation, unspecified: Secondary | ICD-10-CM

## 2016-11-02 DIAGNOSIS — Z681 Body mass index (BMI) 19 or less, adult: Secondary | ICD-10-CM

## 2016-11-02 DIAGNOSIS — E1151 Type 2 diabetes mellitus with diabetic peripheral angiopathy without gangrene: Secondary | ICD-10-CM | POA: Diagnosis present

## 2016-11-02 DIAGNOSIS — I472 Ventricular tachycardia: Secondary | ICD-10-CM | POA: Diagnosis not present

## 2016-11-02 DIAGNOSIS — E039 Hypothyroidism, unspecified: Secondary | ICD-10-CM | POA: Diagnosis present

## 2016-11-02 DIAGNOSIS — R7881 Bacteremia: Secondary | ICD-10-CM

## 2016-11-02 DIAGNOSIS — I959 Hypotension, unspecified: Secondary | ICD-10-CM | POA: Diagnosis not present

## 2016-11-02 DIAGNOSIS — G9341 Metabolic encephalopathy: Secondary | ICD-10-CM | POA: Diagnosis present

## 2016-11-02 DIAGNOSIS — R109 Unspecified abdominal pain: Secondary | ICD-10-CM

## 2016-11-02 DIAGNOSIS — B957 Other staphylococcus as the cause of diseases classified elsewhere: Secondary | ICD-10-CM | POA: Diagnosis present

## 2016-11-02 DIAGNOSIS — Z802 Family history of malignant neoplasm of other respiratory and intrathoracic organs: Secondary | ICD-10-CM

## 2016-11-02 DIAGNOSIS — Z931 Gastrostomy status: Secondary | ICD-10-CM

## 2016-11-02 DIAGNOSIS — Z7189 Other specified counseling: Secondary | ICD-10-CM

## 2016-11-02 DIAGNOSIS — Z87891 Personal history of nicotine dependence: Secondary | ICD-10-CM

## 2016-11-02 DIAGNOSIS — R0603 Acute respiratory distress: Secondary | ICD-10-CM

## 2016-11-02 DIAGNOSIS — E119 Type 2 diabetes mellitus without complications: Secondary | ICD-10-CM

## 2016-11-02 DIAGNOSIS — Y831 Surgical operation with implant of artificial internal device as the cause of abnormal reaction of the patient, or of later complication, without mention of misadventure at the time of the procedure: Secondary | ICD-10-CM | POA: Diagnosis present

## 2016-11-02 DIAGNOSIS — R339 Retention of urine, unspecified: Secondary | ICD-10-CM | POA: Diagnosis not present

## 2016-11-02 DIAGNOSIS — Z515 Encounter for palliative care: Secondary | ICD-10-CM | POA: Diagnosis not present

## 2016-11-02 DIAGNOSIS — Z7982 Long term (current) use of aspirin: Secondary | ICD-10-CM

## 2016-11-02 DIAGNOSIS — D696 Thrombocytopenia, unspecified: Secondary | ICD-10-CM | POA: Diagnosis present

## 2016-11-02 DIAGNOSIS — D691 Qualitative platelet defects: Secondary | ICD-10-CM | POA: Diagnosis present

## 2016-11-02 DIAGNOSIS — J690 Pneumonitis due to inhalation of food and vomit: Secondary | ICD-10-CM | POA: Diagnosis present

## 2016-11-02 DIAGNOSIS — Z7984 Long term (current) use of oral hypoglycemic drugs: Secondary | ICD-10-CM

## 2016-11-02 LAB — CBC WITH DIFFERENTIAL/PLATELET
BASOS ABS: 0 10*3/uL (ref 0.0–0.1)
Basophils Relative: 0 %
EOS PCT: 1 %
Eosinophils Absolute: 0 10*3/uL (ref 0.0–0.7)
HCT: 42.7 % (ref 39.0–52.0)
HEMOGLOBIN: 14.5 g/dL (ref 13.0–17.0)
LYMPHS PCT: 8 %
Lymphs Abs: 0.5 10*3/uL — ABNORMAL LOW (ref 0.7–4.0)
MCH: 33 pg (ref 26.0–34.0)
MCHC: 34 g/dL (ref 30.0–36.0)
MCV: 97.3 fL (ref 78.0–100.0)
Monocytes Absolute: 0.4 10*3/uL (ref 0.1–1.0)
Monocytes Relative: 6 %
NEUTROS ABS: 5.4 10*3/uL (ref 1.7–7.7)
NEUTROS PCT: 85 %
PLATELETS: 96 10*3/uL — AB (ref 150–400)
RBC: 4.39 MIL/uL (ref 4.22–5.81)
RDW: 13.2 % (ref 11.5–15.5)
WBC: 6.4 10*3/uL (ref 4.0–10.5)

## 2016-11-02 LAB — COMPREHENSIVE METABOLIC PANEL
ALT: 18 U/L (ref 17–63)
ANION GAP: 8 (ref 5–15)
AST: 44 U/L — ABNORMAL HIGH (ref 15–41)
Albumin: 4 g/dL (ref 3.5–5.0)
Alkaline Phosphatase: 126 U/L (ref 38–126)
BILIRUBIN TOTAL: 2.2 mg/dL — AB (ref 0.3–1.2)
BUN: 24 mg/dL — ABNORMAL HIGH (ref 6–20)
CHLORIDE: 105 mmol/L (ref 101–111)
CO2: 25 mmol/L (ref 22–32)
Calcium: 9.2 mg/dL (ref 8.9–10.3)
Creatinine, Ser: 0.97 mg/dL (ref 0.61–1.24)
Glucose, Bld: 115 mg/dL — ABNORMAL HIGH (ref 65–99)
POTASSIUM: 3.9 mmol/L (ref 3.5–5.1)
Sodium: 138 mmol/L (ref 135–145)
TOTAL PROTEIN: 7.8 g/dL (ref 6.5–8.1)

## 2016-11-02 LAB — LACTIC ACID, PLASMA: LACTIC ACID, VENOUS: 1.3 mmol/L (ref 0.5–1.9)

## 2016-11-02 MED ORDER — ACETAMINOPHEN 160 MG/5ML PO SOLN
650.0000 mg | Freq: Once | ORAL | Status: AC
  Administered 2016-11-03: 650 mg
  Filled 2016-11-02: qty 20.3

## 2016-11-02 MED ORDER — ONDANSETRON HCL 4 MG/2ML IJ SOLN
4.0000 mg | Freq: Once | INTRAMUSCULAR | Status: AC
  Administered 2016-11-02: 4 mg via INTRAVENOUS
  Filled 2016-11-02: qty 2

## 2016-11-02 MED ORDER — SODIUM CHLORIDE 0.9 % IV BOLUS (SEPSIS)
500.0000 mL | Freq: Once | INTRAVENOUS | Status: AC
  Administered 2016-11-03: 500 mL via INTRAVENOUS

## 2016-11-02 MED ORDER — ACETAMINOPHEN 325 MG PO TABS: 650.0000 mg | ORAL_TABLET | Freq: Once | ORAL | Status: DC

## 2016-11-02 MED ORDER — SODIUM CHLORIDE 0.9 % IV BOLUS (SEPSIS)
500.0000 mL | Freq: Once | INTRAVENOUS | Status: AC
  Administered 2016-11-02: 500 mL via INTRAVENOUS

## 2016-11-02 NOTE — Telephone Encounter (Signed)
lvm to inform pt of 11/28 appt date/time per LOS

## 2016-11-02 NOTE — ED Triage Notes (Signed)
Patient bib today by daughter.  Daughter states that patient had a fever at home today of 104.2.  Temp was taken in the ear.  Current oral temp in triage 99.2.  Patient states has had some nausea but, denies vomiting.  Patient was seen at [facility] today for replacement of his feeding tube.
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
## 2016-11-02 NOTE — ED Notes (Signed)
2nd set of Blood cultures from [facility].

## 2016-11-02 NOTE — ED Notes (Signed)
Pt denies need to void. Bolus started at this time. Will attempt to obtain specimen again within the hour

## 2016-11-02 NOTE — ED Notes (Signed)
Patient 87% on RA, applied [place] at 2L currently 92%.

## 2016-11-03 ENCOUNTER — Encounter (HOSPITAL_COMMUNITY): Payer: Self-pay | Admitting: Emergency Medicine

## 2016-11-03 ENCOUNTER — Inpatient Hospital Stay (HOSPITAL_COMMUNITY): Payer: Medicare Other

## 2016-11-03 DIAGNOSIS — J690 Pneumonitis due to inhalation of food and vomit: Secondary | ICD-10-CM | POA: Diagnosis present

## 2016-11-03 DIAGNOSIS — J189 Pneumonia, unspecified organism: Secondary | ICD-10-CM | POA: Diagnosis present

## 2016-11-03 DIAGNOSIS — R64 Cachexia: Secondary | ICD-10-CM | POA: Diagnosis present

## 2016-11-03 DIAGNOSIS — R339 Retention of urine, unspecified: Secondary | ICD-10-CM | POA: Diagnosis not present

## 2016-11-03 DIAGNOSIS — Z95820 Peripheral vascular angioplasty status with implants and grafts: Secondary | ICD-10-CM | POA: Diagnosis not present

## 2016-11-03 DIAGNOSIS — F05 Delirium due to known physiological condition: Secondary | ICD-10-CM | POA: Diagnosis not present

## 2016-11-03 DIAGNOSIS — E43 Unspecified severe protein-calorie malnutrition: Secondary | ICD-10-CM | POA: Diagnosis present

## 2016-11-03 DIAGNOSIS — G9341 Metabolic encephalopathy: Secondary | ICD-10-CM | POA: Diagnosis present

## 2016-11-03 DIAGNOSIS — R1312 Dysphagia, oropharyngeal phase: Secondary | ICD-10-CM | POA: Diagnosis present

## 2016-11-03 DIAGNOSIS — T827XXA Infection and inflammatory reaction due to other cardiac and vascular devices, implants and grafts, initial encounter: Secondary | ICD-10-CM | POA: Diagnosis present

## 2016-11-03 DIAGNOSIS — I330 Acute and subacute infective endocarditis: Secondary | ICD-10-CM | POA: Diagnosis present

## 2016-11-03 DIAGNOSIS — Z681 Body mass index (BMI) 19 or less, adult: Secondary | ICD-10-CM | POA: Diagnosis not present

## 2016-11-03 DIAGNOSIS — I2510 Atherosclerotic heart disease of native coronary artery without angina pectoris: Secondary | ICD-10-CM | POA: Diagnosis present

## 2016-11-03 DIAGNOSIS — Y831 Surgical operation with implant of artificial internal device as the cause of abnormal reaction of the patient, or of later complication, without mention of misadventure at the time of the procedure: Secondary | ICD-10-CM | POA: Diagnosis present

## 2016-11-03 DIAGNOSIS — I10 Essential (primary) hypertension: Secondary | ICD-10-CM | POA: Diagnosis present

## 2016-11-03 DIAGNOSIS — D691 Qualitative platelet defects: Secondary | ICD-10-CM

## 2016-11-03 DIAGNOSIS — I472 Ventricular tachycardia: Secondary | ICD-10-CM | POA: Diagnosis not present

## 2016-11-03 DIAGNOSIS — J449 Chronic obstructive pulmonary disease, unspecified: Secondary | ICD-10-CM

## 2016-11-03 DIAGNOSIS — R7881 Bacteremia: Secondary | ICD-10-CM | POA: Diagnosis not present

## 2016-11-03 DIAGNOSIS — I482 Chronic atrial fibrillation: Secondary | ICD-10-CM

## 2016-11-03 DIAGNOSIS — A419 Sepsis, unspecified organism: Secondary | ICD-10-CM | POA: Diagnosis present

## 2016-11-03 DIAGNOSIS — E039 Hypothyroidism, unspecified: Secondary | ICD-10-CM | POA: Diagnosis present

## 2016-11-03 DIAGNOSIS — C229 Malignant neoplasm of liver, not specified as primary or secondary: Secondary | ICD-10-CM | POA: Diagnosis present

## 2016-11-03 DIAGNOSIS — E785 Hyperlipidemia, unspecified: Secondary | ICD-10-CM | POA: Diagnosis present

## 2016-11-03 DIAGNOSIS — Z66 Do not resuscitate: Secondary | ICD-10-CM | POA: Diagnosis not present

## 2016-11-03 DIAGNOSIS — E1151 Type 2 diabetes mellitus with diabetic peripheral angiopathy without gangrene: Secondary | ICD-10-CM | POA: Diagnosis present

## 2016-11-03 DIAGNOSIS — J9621 Acute and chronic respiratory failure with hypoxia: Secondary | ICD-10-CM | POA: Diagnosis present

## 2016-11-03 DIAGNOSIS — R627 Adult failure to thrive: Secondary | ICD-10-CM | POA: Diagnosis present

## 2016-11-03 DIAGNOSIS — Z515 Encounter for palliative care: Secondary | ICD-10-CM | POA: Diagnosis not present

## 2016-11-03 LAB — URINALYSIS, ROUTINE W REFLEX MICROSCOPIC
BILIRUBIN URINE: NEGATIVE
HGB URINE DIPSTICK: NEGATIVE
Ketones, ur: NEGATIVE mg/dL
Leukocytes, UA: NEGATIVE
Nitrite: NEGATIVE
PROTEIN: NEGATIVE mg/dL
Specific Gravity, Urine: 1.029 (ref 1.005–1.030)
pH: 6 (ref 5.0–8.0)

## 2016-11-03 LAB — MAGNESIUM: MAGNESIUM: 1.4 mg/dL — AB (ref 1.7–2.4)

## 2016-11-03 LAB — LACTIC ACID, PLASMA
Lactic Acid, Venous: 1.3 mmol/L (ref 0.5–1.9)
Lactic Acid, Venous: 1 mmol/L (ref 0.5–1.9)

## 2016-11-03 LAB — URINE MICROSCOPIC-ADD ON: Squamous Epithelial / LPF: NONE SEEN

## 2016-11-03 LAB — INFLUENZA PANEL BY PCR (TYPE A & B)
INFLAPCR: NEGATIVE
INFLBPCR: NEGATIVE

## 2016-11-03 LAB — PROCALCITONIN: PROCALCITONIN: 3.14 ng/mL

## 2016-11-03 LAB — GLUCOSE, CAPILLARY
GLUCOSE-CAPILLARY: 125 mg/dL — AB (ref 65–99)
GLUCOSE-CAPILLARY: 115 mg/dL — AB (ref 65–99)
Glucose-Capillary: 164 mg/dL — ABNORMAL HIGH (ref 65–99)
Glucose-Capillary: 158 mg/dL — ABNORMAL HIGH (ref 65–99)

## 2016-11-03 MED ORDER — CYPROHEPTADINE HCL 4 MG PO TABS
4.0000 mg | ORAL_TABLET | Freq: Every day | ORAL | Status: DC
  Administered 2016-11-03 – 2016-11-09 (×8): 4 mg
  Filled 2016-11-03 (×8): qty 1

## 2016-11-03 MED ORDER — VANCOMYCIN HCL 500 MG IV SOLR
500.0000 mg | Freq: Two times a day (BID) | INTRAVENOUS | Status: DC
  Administered 2016-11-03 – 2016-11-05 (×5): 500 mg via INTRAVENOUS
  Filled 2016-11-03 (×5): qty 500

## 2016-11-03 MED ORDER — OXYCODONE HCL 5 MG PO TABS
7.5000 mg | ORAL_TABLET | Freq: Two times a day (BID) | ORAL | Status: DC
  Administered 2016-11-03 – 2016-11-05 (×5): 15 mg
  Filled 2016-11-03 (×5): qty 3

## 2016-11-03 MED ORDER — PIPERACILLIN-TAZOBACTAM 3.375 G IVPB: 3.3750 g | Freq: Three times a day (TID) | INTRAVENOUS | Status: DC

## 2016-11-03 MED ORDER — SODIUM CHLORIDE 0.9 % IV SOLN
INTRAVENOUS | Status: DC
  Administered 2016-11-03 – 2016-11-06 (×6): via INTRAVENOUS

## 2016-11-03 MED ORDER — ZOLPIDEM TARTRATE 5 MG PO TABS: 5.0000 mg | ORAL_TABLET | Freq: Every evening | ORAL | Status: DC | PRN

## 2016-11-03 MED ORDER — DEXTROSE 5 % IV SOLN
1.0000 g | Freq: Once | INTRAVENOUS | Status: DC
  Filled 2016-11-03: qty 1

## 2016-11-03 MED ORDER — ONDANSETRON HCL 4 MG/2ML IJ SOLN: 4.0000 mg | Freq: Three times a day (TID) | INTRAMUSCULAR | Status: DC | PRN

## 2016-11-03 MED ORDER — SODIUM CHLORIDE 0.9 % IV BOLUS (SEPSIS)
500.0000 mL | Freq: Once | INTRAVENOUS | Status: AC
  Administered 2016-11-03: 500 mL via INTRAVENOUS

## 2016-11-03 MED ORDER — LEVOTHYROXINE SODIUM 88 MCG PO TABS
88.0000 ug | ORAL_TABLET | Freq: Every day | ORAL | Status: DC
  Administered 2016-11-03 – 2016-11-10 (×8): 88 ug
  Filled 2016-11-03 (×7): qty 1

## 2016-11-03 MED ORDER — BUDESONIDE 0.25 MG/2ML IN SUSP
0.2500 mg | Freq: Four times a day (QID) | RESPIRATORY_TRACT | Status: DC
Start: 2016-11-03 — End: 2016-11-03
  Administered 2016-11-03: 0.25 mg via RESPIRATORY_TRACT
  Filled 2016-11-03: qty 2

## 2016-11-03 MED ORDER — IPRATROPIUM-ALBUTEROL 0.5-2.5 (3) MG/3ML IN SOLN
3.0000 mL | Freq: Three times a day (TID) | RESPIRATORY_TRACT | Status: DC
  Administered 2016-11-03 – 2016-11-09 (×17): 3 mL via RESPIRATORY_TRACT
  Filled 2016-11-03 (×18): qty 3

## 2016-11-03 MED ORDER — OXYCODONE HCL 5 MG PO TABS
7.5000 mg | ORAL_TABLET | Freq: Every day | ORAL | Status: DC | PRN
  Filled 2016-11-03: qty 3

## 2016-11-03 MED ORDER — VANCOMYCIN HCL IN DEXTROSE 1-5 GM/200ML-% IV SOLN
1000.0000 mg | INTRAVENOUS | Status: AC
  Administered 2016-11-03: 1000 mg via INTRAVENOUS
  Filled 2016-11-03: qty 200

## 2016-11-03 MED ORDER — PIPERACILLIN-TAZOBACTAM 3.375 G IVPB 30 MIN: 3.3750 g | INTRAVENOUS | Status: DC

## 2016-11-03 MED ORDER — GUAIFENESIN 100 MG/5ML PO SOLN
200.0000 mg | ORAL | Status: DC | PRN
  Administered 2016-11-04: 200 mg
  Filled 2016-11-03: qty 10

## 2016-11-03 MED ORDER — PIPERACILLIN-TAZOBACTAM 3.375 G IVPB
3.3750 g | Freq: Three times a day (TID) | INTRAVENOUS | Status: DC
  Administered 2016-11-03 – 2016-11-05 (×8): 3.375 g via INTRAVENOUS
  Filled 2016-11-03 (×9): qty 50

## 2016-11-03 MED ORDER — IPRATROPIUM-ALBUTEROL 0.5-2.5 (3) MG/3ML IN SOLN
3.0000 mL | RESPIRATORY_TRACT | Status: DC
  Administered 2016-11-03: 3 mL via RESPIRATORY_TRACT
  Filled 2016-11-03: qty 3

## 2016-11-03 MED ORDER — INSULIN ASPART 100 UNIT/ML ~~LOC~~ SOLN
0.0000 [IU] | Freq: Every day | SUBCUTANEOUS | Status: DC
  Administered 2016-11-07 – 2016-11-09 (×2): 2 [IU] via SUBCUTANEOUS

## 2016-11-03 MED ORDER — BUDESONIDE 0.25 MG/2ML IN SUSP
0.2500 mg | Freq: Two times a day (BID) | RESPIRATORY_TRACT | Status: DC
  Administered 2016-11-03 – 2016-11-10 (×14): 0.25 mg via RESPIRATORY_TRACT
  Filled 2016-11-03 (×14): qty 2

## 2016-11-03 MED ORDER — ACETAMINOPHEN 325 MG PO TABS
650.0000 mg | ORAL_TABLET | Freq: Four times a day (QID) | ORAL | Status: DC | PRN
  Administered 2016-11-05 – 2016-11-07 (×2): 650 mg via ORAL
  Filled 2016-11-03 (×2): qty 2

## 2016-11-03 MED ORDER — ALBUTEROL SULFATE (2.5 MG/3ML) 0.083% IN NEBU: 2.5000 mg | INHALATION_SOLUTION | RESPIRATORY_TRACT | Status: DC | PRN

## 2016-11-03 MED ORDER — SODIUM CHLORIDE 0.9 % IV BOLUS (SEPSIS)
1000.0000 mL | Freq: Once | INTRAVENOUS | Status: AC
  Administered 2016-11-03: 1000 mL via INTRAVENOUS

## 2016-11-03 MED ORDER — INSULIN ASPART 100 UNIT/ML ~~LOC~~ SOLN
0.0000 [IU] | Freq: Three times a day (TID) | SUBCUTANEOUS | Status: DC
  Administered 2016-11-03 – 2016-11-04 (×5): 2 [IU] via SUBCUTANEOUS
  Administered 2016-11-05 (×2): 3 [IU] via SUBCUTANEOUS
  Administered 2016-11-05 – 2016-11-06 (×2): 2 [IU] via SUBCUTANEOUS
  Administered 2016-11-06 – 2016-11-07 (×3): 3 [IU] via SUBCUTANEOUS
  Administered 2016-11-09: 5 [IU] via SUBCUTANEOUS
  Administered 2016-11-10: 2 [IU] via SUBCUTANEOUS

## 2016-11-03 MED ORDER — OSMOLITE 1.5 CAL PO LIQD
1000.0000 mL | ORAL | Status: DC
  Administered 2016-11-03 – 2016-11-07 (×4): 1000 mL
  Filled 2016-11-03 (×5): qty 1000

## 2016-11-03 MED ORDER — SODIUM CHLORIDE 0.9 % IV BOLUS (SEPSIS): 1000.0000 mL | Freq: Once | INTRAVENOUS | Status: DC

## 2016-11-03 MED ORDER — POLYETHYLENE GLYCOL 3350 17 G PO PACK
8.5000 g | PACK | Freq: Every day | ORAL | Status: DC
  Administered 2016-11-04 – 2016-11-08 (×5): 8.5 g
  Filled 2016-11-03 (×7): qty 1

## 2016-11-03 MED ORDER — B COMPLEX-C PO TABS
1.0000 | ORAL_TABLET | Freq: Every day | ORAL | Status: DC
  Administered 2016-11-03 – 2016-11-10 (×8): 1 via ORAL
  Filled 2016-11-03 (×8): qty 1

## 2016-11-03 MED ORDER — ASPIRIN 81 MG PO CHEW
81.0000 mg | CHEWABLE_TABLET | Freq: Every day | ORAL | Status: DC
  Administered 2016-11-03 – 2016-11-09 (×8): 81 mg
  Filled 2016-11-03 (×8): qty 1

## 2016-11-03 NOTE — ED Notes (Signed)
Attempted to have pt void for urine sample. Pt unable and bladder scan revealed 54cc in his bladder. NP in the ED made aware and NP states that it is ok to avoid I&O cath at this time and to continue with antibiotic administration at this time.

## 2016-11-03 NOTE — Progress Notes (Signed)
Order for swallow evaluation received,  Spoke to MD, pt and pt's daughter *who arrived later in session.  Pt expressed agreement to have MBS to allow instrumental swallow evaluation.  Of note, daughter reports pt is his own decision maker if he is of sound mind - pt reports he is in agreement to be DNR and daughter is in agreement.  Advised MD to pt desires for DNR.  MBS may not be indicated if pt desires to be DNR- will await family/pt/Md input.    Luanna Salk, [facility] SLP   [phone]

## 2016-11-03 NOTE — ED Notes (Signed)
Admitting MD notified of "soft" BP and periods of bradycardia. Pt sleeping soundly. Awaiting call back.

## 2016-11-03 NOTE — Progress Notes (Signed)
Speech Language Pathology Treatment: Dysphagia  Patient Details Name: [name] MRN: [id number] DOB: [birth year] Today's Date: 11/03/2016 Time: [id number] SLP Time Calculation (min) (ACUTE ONLY): 15 min  Assessment / Plan / Recommendation Clinical Impression  SLP saw pt to provide written compensation and aspiration mitigation strategies and review MBS. Pt able to recall events of MBS and that thicker consistencies leave more residuals.  He stated his precautions - swallowing twice and expectorating.  SlP set up oral suction for pt and had him return demonstration of its use.    Pt continues to be npo in system, advised pt to need to use oral swabs only until and if MD approves diet for him.  Provided him with ice cold water and toothette and he verbalized oral swabbing only.  Pt making progress with understanding of testing - to mod I cues needed.    Will follow up x1 to educate daughter with use of video for maximal understanding and ensure pt use of compensation strategies.  Thanks for allowing me to care for this pt.    HPI HPI: 82 yo male with complex medical history including COPD, severe dysphagia s/p PEG - PEG just replaced yesterday at [facility], pt was febrile at home with ? vomiting.  admitted with fever, concern for pna.  Swallow evaluation ordered.  Pt has had several MBS and speech treatments/evaluations and has been on a diet at home despite aspiration risk.  Pt reports continued desire to eat.  Will plan MBS given last test in 2015 for pt and family education- Md, pt and family aware and agreeable.         SLP Plan  Continue with current plan of care     Recommendations  Diet recommendations: Thin liquid;Nectar-thick liquid (liquids with known aspiration risks and mitigation strategies) Liquids provided via: Cup;No straw Medication Administration: Via alternative means Supervision: Patient able to self feed Compensations: Slow rate;Small sips/bites;Multiple dry swallows after  each bite/sip;Other (Comment);Hard cough after swallow (cough and expectorate) Postural Changes and/or Swallow Maneuvers: Seated upright 90 degrees;Upright 30-60 min after meal                Oral Care Recommendations: Oral care BID Follow up Recommendations:  (tbd) Plan: Continue with current plan of care       [place], [place], [place] [facility] SLP [phone]

## 2016-11-03 NOTE — H&P (Addendum)
History and Physical    [name] [id number] DOB: 1931/10/10 DOA: 11/02/2016  Referring MD/NP/PA:   PCP: Conni Slipper, NP   Patient coming from:  The patient is coming from home.  At baseline, pt is partially dependent for most of ADL.  Chief Complaint: Fever, cough, shortness of breath  HPI: [name] is a 82 y.o. male with medical history significant of hypertension, hyperlipidemia, diabetes mellitus, GIB, COPD, hypothyroidism, thrombocytopenia, s/p of pacemaker placement, A. fib not on anticoagulants, hepatocellular carcinoma (S/p of right lobe resection 2012), dissecting abdominal arterial aneurysm, chronic back pain, dysphagia, s/p of PEG tube placement, who presents with fever, cough, shortness of breath.  Pt states that his symptoms started today. He has fever, cough and shortness of breath. He had fever of 104.2 (Temp was taken in the ear by her daughter). He coughs up white colored mucus, denies chest pain. He has nausea, but no vomiting. Patient states that he had PEG tube replaced at [facility] today. Patient does not have abdominal pain, diarrhea, symptoms of UTI or unilateral weakness. He states that he still eating liquid food through mouth some times.  ED Course: pt was found to have WBC 6.4, lactate 1.5, temperature 102.9, any urinalysis, electrolytes renal function okay, oxygen saturation 87 on room air, which improved to 92 on 2 L oxygen. X-ray of acute abdomen/chest showed left lower lobe infiltration. pt is admitted to telemetry bed as inpatient.  Review of Systems:   General: has fevers, chills, no changes in body weight, has poor appetite, has fatigue HEENT: no blurry vision, hearing changes or sore throat Respiratory: has dyspnea, coughing, no wheezing CV: no chest pain, no palpitations GI: has nausea, no vomiting, abdominal pain, diarrhea, constipation GU: no dysuria, burning on urination, increased urinary frequency, hematuria  Ext: no leg  edema Neuro: no unilateral weakness, numbness, or tingling, no vision change or hearing loss Skin: no rash, no skin tear. MSK: No muscle spasm, no deformity, no limitation of range of movement in spin Heme: No easy bruising.  Travel history: No recent long distant travel.  Allergy: No Known Allergies  Past Medical History:  Diagnosis Date  . Back pain   . Cancer ([place])   . COPD (chronic obstructive pulmonary disease) (HCC)    Gold C  . Coronary artery disease   . Dissecting aortic aneurysm, abdominal ([place]) 11/13/2011   Overview:  Severe aortoiliac stenosis with possible a chronic dissection versus  ulcerated plaque/thrombus extending above and below the aortic  bifurcation; without aneurysmal dilation  as per CT abdomen noted 09/25/2011   . DM2 (diabetes mellitus, type 2) ([place])    takes Tradjenta daily  . Dysphagia    s/p PEG placement  . Henry (hepatocellular carcinoma) ([place])    s/p right lobe resection 2012  . Hyperlipidemia   . Hypertension   . Hypothyroidism    takes Synthroid daily   . Pacemaker 11/11/2015   St. Jude single chamber pacemaker implanted at [facility] 11/2014.  Marland Kitchen Paroxysmal atrial flutter (HCC)    and fibrillation with known bradycardia, Chads2vasc of 4, not on A/C due to thrombocytopenia and functional status  . Peripheral vascular disease ([place])    LICA stent [id number]  . Pneumonia 11/03/2012   aspiration pneumonias  . Pneumothorax 10/2015  . Presence of permanent cardiac pacemaker   . Severe malnutrition ([place])   . Shortness of breath dyspnea    with exertion   . Thrombocytopenia ([place])     Past Surgical History:  Procedure  Laterality Date  . CAROTID ANGIOGRAM  2012  . CAROTID STENT  2002  . cataract surgery      bilateral  . COLONOSCOPY    . DOPPLER ECHOCARDIOGRAPHY  2011  . ESOPHAGOGASTRODUODENOSCOPY  10/01/2012   Procedure: ESOPHAGOGASTRODUODENOSCOPY (EGD);  Surgeon: Gwenyth Ober, MD;  Location: [place];  Service: General;  Laterality: N/A;   wyatt/leone  . ESOPHAGOGASTRODUODENOSCOPY  10/31/2012   Procedure: ESOPHAGOGASTRODUODENOSCOPY (EGD);  Surgeon: Beryle Beams, MD;  Location: [facility] ENDOSCOPY;  Service: Endoscopy;  Laterality: N/A;  . IR GENERIC HISTORICAL  07/25/2016   IR [place] DUODEN/JEJUNO TUBE PERCUT W/FLUORO 07/25/2016 Markus Daft, MD [facility]-INTERV RAD  . IR GENERIC HISTORICAL  08/30/2016   IR GJ TUBE CHANGE 08/30/2016 Corrie Mckusick, DO [facility]INTER[facility]  . IR GENERIC HISTORICAL  09/04/2016   IR CM INJ ANY COLONIC TUBE W/FLUORO 09/04/2016 [facility]INTER[facility] RAD  . IR GENERIC HISTORICAL  09/20/2016   IR CM INJ ANY COLONIC TUBE W/FLUORO 09/20/2016 Corrie Mckusick, DO [facility]  . IR GENERIC HISTORICAL  09/20/2016   IR CT SPINE LTD 09/20/2016 Corrie Mckusick, DO [facility]  . liver canc    . pacemaker inserted    . PARS PLANA VITRECTOMY Right 11/14/2015   Procedure: PARS PLANA VITRECTOMY WITH 25 GAUGE, WITH ENDOLASER;  Surgeon: Hurman Horn, MD;  Location: [place];  Service: Ophthalmology;  Laterality: Right;  . PEG PLACEMENT  10/01/2012   Procedure: PERCUTANEOUS ENDOSCOPIC GASTROSTOMY (PEG) PLACEMENT;  Surgeon: Gwenyth Ober, MD;  Location: [place];  Service: General;  Laterality: N/A;  . PEG TUBE PLACEMENT  10/12/2015      replacment tube  . POSTERIOR CERVICAL FUSION/FORAMINOTOMY  09/25/2012   Procedure: POSTERIOR CERVICAL FUSION/FORAMINOTOMY LEVEL 2;  Surgeon: Eustace Moore, MD;  Location: [place] NEURO ORS;  Service: Neurosurgery;  Laterality: N/A;  Posterior Cervical three-four laminectomy, posterior cervical three-four, four-five fusion    Social History:  reports that he quit smoking about 5 years ago. His smoking use included Cigarettes. He has a 55.00 pack-year smoking history. He has never used smokeless tobacco. He reports that he does not drink alcohol or use drugs.  Family History:  Family History  Problem Relation Age of Onset  . Cancer Father     throat, died young  . Liver disease Brother   . CAD Neg Hx      Prior to Admission  medications   Medication Sig Start Date End Date Taking? Authorizing Provider  albuterol (PROVENTIL) (2.5 MG/3ML) 0.083% nebulizer solution Take 3 mLs (2.5 mg total) by nebulization every 6 (six) hours as needed for wheezing or shortness of breath. 09/12/14  Yes Oswald Hillock, MD  aspirin EC 81 MG tablet Place 81 mg into feeding tube at bedtime.    Yes Historical Provider, MD  budesonide (PULMICORT) 0.25 MG/2ML nebulizer solution Take 0.25 mg by nebulization every 6 (six) hours.  05/24/15  Yes Historical Provider, MD  cyproheptadine (PERIACTIN) 4 MG tablet Place 4 mg into feeding tube at bedtime.  05/24/15  Yes Historical Provider, MD  ergocalciferol (VITAMIN D2) 50000 units capsule Take 50,000 Units by mouth once a week.   Yes Historical Provider, MD  levothyroxine (SYNTHROID, LEVOTHROID) 88 MCG tablet Take 88 mcg by mouth daily.   Yes Historical Provider, MD  linagliptin (TRADJENTA) 5 MG TABS tablet Place 5 mg into feeding tube at bedtime.    Yes Historical Provider, MD  Nutritional Supplements (FEEDING SUPPLEMENT, OSMOLITE 1.5 CAL,) LIQD Place 1,000 mLs into feeding tube continuous.  Yes Historical Provider, MD  oxyCODONE (ROXICODONE) 15 MG immediate release tablet Place 7.5-15 mg into feeding tube See admin instructions. Take 1/2 - 1 tablet by feeding tube twice daily (depending on pain level), may take an additional 1/2 -1 tablet at noon if needed for pain 09/09/15  Yes Historical Provider, MD  polyethylene glycol (MIRALAX / GLYCOLAX) packet Place 8.5 g into feeding tube daily.  11/09/15  Yes Historical Provider, MD  tiotropium (SPIRIVA) 18 MCG inhalation capsule Place 18 mcg into inhaler and inhale 2 (two) times daily.   Yes Historical Provider, MD    Physical Exam: Vitals:   11/02/16 2222 11/02/16 2239  BP: 114/60   Pulse: 91   Resp: 20   Temp: 99.2 F (37.3 C) 102.9 F (39.4 C)  TempSrc: Oral Rectal  SpO2: 91%   Weight: 54.9 kg (121 lb)   Height: 5\' 9"  (1.753 m)    General: Not in  acute distress. Cachectic looking. HEENT:       Eyes: PERRL, EOMI, no scleral icterus.       ENT: No discharge from the ears and nose, no pharynx injection, no tonsillar enlargement.        Neck: No JVD, no bruit, no mass felt. Heme: No neck lymph node enlargement. Cardiac: S1/S2, RRR, No murmurs, No gallops or rubs. Respiratory: has decreased air movement bilaterally. No rales, wheezing, rhonchi or rubs. GI: Soft, nondistended, nontender, no rebound pain, no organomegaly, BS present. Has PEG tube in place with clean surroundings. GU: No hematuria Ext: No pitting leg edema bilaterally. 2+DP/PT pulse bilaterally. Musculoskeletal: No joint deformities, No joint redness or warmth, no limitation of ROM in spin. Skin: No rashes.  Neuro: Alert, oriented X3, cranial nerves II-XII grossly intact, moves all extremities normally.  Psych: Patient is not psychotic, no suicidal or hemocidal ideation.  Labs on Admission: I have personally reviewed following labs and imaging studies  CBC:  Recent Labs Lab 11/02/16 2311  WBC 6.4  NEUTROABS 5.4  HGB 14.5  HCT 42.7  MCV 97.3  PLT 96*   Basic Metabolic Panel:  Recent Labs Lab 11/02/16 2311  NA 138  K 3.9  CL 105  CO2 25  GLUCOSE 115*  BUN 24*  CREATININE 0.97  CALCIUM 9.2   GFR: Estimated Creatinine Clearance: 44.8 mL/min (by C-G formula based on SCr of 0.97 mg/dL). Liver Function Tests:  Recent Labs Lab 11/02/16 2311  AST 44*  ALT 18  ALKPHOS 126  BILITOT 2.2*  PROT 7.8  ALBUMIN 4.0   No results for input(s): LIPASE, AMYLASE in the last 168 hours. No results for input(s): AMMONIA in the last 168 hours. Coagulation Profile: No results for input(s): INR, PROTIME in the last 168 hours. Cardiac Enzymes: No results for input(s): CKTOTAL, CKMB, CKMBINDEX, TROPONINI in the last 168 hours. BNP (last 3 results) No results for input(s): PROBNP in the last 8760 hours. HbA1C: No results for input(s): HGBA1C in the last 72  hours. CBG: No results for input(s): GLUCAP in the last 168 hours. Lipid Profile: No results for input(s): CHOL, HDL, LDLCALC, TRIG, CHOLHDL, LDLDIRECT in the last 72 hours. Thyroid Function Tests: No results for input(s): TSH, T4TOTAL, FREET4, T3FREE, THYROIDAB in the last 72 hours. Anemia Panel: No results for input(s): VITAMINB12, FOLATE, FERRITIN, TIBC, IRON, RETICCTPCT in the last 72 hours. Urine analysis:    Component Value Date/Time   COLORURINE YELLOW 04/01/2015 1701   APPEARANCEUR CLEAR 04/01/2015 1701   LABSPEC 1.019 04/01/2015 1701   PHURINE  8.0 04/01/2015 1701   GLUCOSEU NEGATIVE 04/01/2015 1701   HGBUR NEGATIVE 04/01/2015 1701   BILIRUBINUR NEGATIVE 04/01/2015 1701   KETONESUR NEGATIVE 04/01/2015 1701   PROTEINUR NEGATIVE 04/01/2015 1701   UROBILINOGEN 1.0 04/01/2015 1701   NITRITE NEGATIVE 04/01/2015 1701   LEUKOCYTESUR TRACE (A) 04/01/2015 1701   Sepsis Labs: @LABRCNTIP (procalcitonin:4,lacticidven:4) )No results found for this or any previous visit (from the past 240 hour(s)).   Radiological Exams on Admission: Dg Abd Acute W/chest  Result Date: 11/03/2016 CLINICAL DATA:  Fever and nausea today. EXAM: DG ABDOMEN ACUTE W/ 1V CHEST COMPARISON:  10/31/2015, 09/05/2016. FINDINGS: Airspace consolidation in the left lower lobe is increased from recent studies and may represent pneumonia. There also is a small left pleural effusion. The right lung is clear. The transvenous cardiac pacing lead appears intact. Pulmonary vasculature is normal. Percutaneous feeding gastrojejunostomy appears satisfactorily positioned. Abdominal gas pattern is negative for obstruction or perforation. IMPRESSION: 1. Left lower lobe airspace consolidation, suspicious for pneumonia. Probable small left effusion. 2. Negative for bowel obstruction or perforation. 3. Percutaneous gastrojejunostomy appears satisfactorily positioned. Electronically Signed   By: Andreas Newport M.D.   On: 11/03/2016 00:05      EKG: Not done in ED, will get one.   Assessment/Plan Principal Problem:   Acute on chronic respiratory failure with hypoxia (HCC) Active Problems:   Thrombocytopathia ([place])   DM (diabetes mellitus) ([place])   Hypothyroid   Dysphagia, J tube placed   Copd Golds C   HCAP (healthcare-associated pneumonia)   Aspiration pneumonia ([place])   Chronic atrial fibrillation (HCC)   Pacemaker   Sepsis ([place])   Acute on chronic respiratory failure with hypoxia and mild sepsis: likely due to aspiration pneumonia versus HCAP in the setting of COPD. Pt does not have wheezing or rhonchi on auscultation, does not seem to have COPD exacerbation. Pt meets criteria for sepsis with fever, HR>90 and RR 20. Lactic acid is normal. Hemodynamically stable.  - Will place on telemetry bed for obs. - IV Vancomycin and Zosyn - cough syrup for cough  - prn Albuterol Nebs, DuoNeb, Xopenex Neb prn for SOB - Urine legionella and S. pneumococcal antigen - Follow up blood culture x2, sputum culture and respiratory virus panel, plus Flu pcr - will get Procalcitonin and trend lactic acid level per sepsis protocol - IVF: 1.5L of NS bolus in ED, followed by 100 mL per hour of NS  - SLP  Addendum: pt's blood dropped in AM, SBP 75-85, received 2.5 L NS, now giving 3rd litter NS. Mental status normal, oriented x 3 -will consult to PCCM. -will transfer to SDU   COPD: no acute exacerbation. -DuoNeb nebulizer, when necessary albuterol -Pulmicort nebulizer  Thrombocytopathia [facility]): Platelet 96. No bleeding tendency. -Follow-up CBC  DM-II: Last A1c 6.9 on 09/11/14, well controled. Patient is taking Linagliptin at home -SSI  Hypothyroidism: Last TSH was 2.96 on 09/11/14 -Continue home Synthroid  Atrial Fibrillation: CHA2DS2-VASc Score is 4, needs oral anticoagulation, but patient is not on AC possibly due to history of GI bleeding. Heart rate is well controlled. -Telemetry monitor -Continue aspirin  protein-calorie  malutrition-severe -Nutritional supplements per tube -Consultation nutrition   DVT ppx: SCD Code Status: Full code (pt wants to be full code). Family Communication: None at bed side. Disposition Plan:  Anticipate discharge back to previous home environment Consults called:  none Admission status: Inpatient/tele  Date of Service 11/03/2016    Ivor Costa [facility] Pager [phone]  If 7PM-7AM, please contact night-coverage [email or web address]  Password [email or web address] 11/03/2016, 1:07 AM

## 2016-11-03 NOTE — Progress Notes (Signed)
Dr Blaine Hamper notified regarding rate for tube feeding. Start at 100 mls/hr until nutrition consulted.

## 2016-11-03 NOTE — Progress Notes (Signed)
Patient has been unable to void on his own entire shift. "Feels" as if he needs to void--attempted to try. Bladder scan performed showing 200 ml. Md notified and order to I & O cath once obtained. 375 cc tea-colored urine returned and urine specimen sent. Will continue to monitor. Eulas Post, RN

## 2016-11-03 NOTE — Progress Notes (Addendum)
Pharmacy Antibiotic Note  [name] is a 82 y.o. male admitted on 11/02/2016 with aspiration pneumonia.  Xray suspicious for pneumonia.  Pharmacy has been consulted for Vancomycin & Zosyn dosing.  Plan:  Vancomycin 1gm IV x 1 dose in the ED followed by Vancomycin 500mg  IV q12h  Vancomycin trough goal: 15-20 mcg/ml  Zosyn 3.375gm IV q8h (each dose infused over 4 hrs)  F/U cultures, renal function  Height: 5\' 9"  (175.3 cm) Weight: 121 lb (54.9 kg) IBW/kg (Calculated) : 70.7  Temp (24hrs), Avg:101.1 F (38.4 C), Min:99.2 F (37.3 C), Max:102.9 F (39.4 C)   Recent Labs Lab 11/02/16 2308 11/02/16 2311  WBC  --  6.4  CREATININE  --  0.97  LATICACIDVEN 1.3  --     Estimated Creatinine Clearance: 44.8 mL/min (by C-G formula based on SCr of 0.97 mg/dL).    No Known Allergies  Antimicrobials this admission: 11/4 Zosyn >>   11/4 Vanc >>    Dose adjustments this admission:    Microbiology results: 11/4 BCx: sent 11/4 UCx: sent 11/4 Sputum Cx: sent  Thank you for allowing pharmacy to be a part of this patient's care.  Everette Rank, PharmD 11/03/2016 12:17 AM

## 2016-11-03 NOTE — Progress Notes (Signed)
PROGRESS NOTE    [name]  [id number] DOB: 1931-09-24 DOA: 11/02/2016 PCP: Conni Slipper, NP    Brief Narrative:  [name] is a 82 y.o. male with medical history significant of hypertension, hyperlipidemia, diabetes mellitus, GIB, COPD, hypothyroidism, thrombocytopenia, s/p of pacemaker placement, A. fib not on anticoagulants, hepatocellular carcinoma (S/p of right lobe resection 2012), dissecting abdominal arterial aneurysm, chronic back pain, dysphagia, s/p of PEG tube placement, who presents with fever, cough, shortness of breath.  Pt states that his symptoms started today. He has fever, cough and shortness of breath. He had fever of 104.2 (Temp was taken in the ear by her daughter). He coughs up white colored mucus, denies chest pain. He has nausea, but no vomiting. Patient states that he had PEG tube replaced at [facility] today. Patient does not have abdominal pain, diarrhea, symptoms of UTI or unilateral weakness. He states that he still eating liquid food through mouth some times.  ED Course: pt was found to have WBC 6.4, lactate 1.5, temperature 102.9, any urinalysis, electrolytes renal function okay, oxygen saturation 87 on room air, which improved to 92 on 2 L oxygen. X-ray of acute abdomen/chest showed left lower lobe infiltration. pt is admitted to telemetry bed as inpatient.  Patient admitted on in the morning of 11/4 developed hypotension with BP in the 70s/20s; he remained cognitive throughout period of hypotension.  Additional bolus of fluid given and PCCM was consulted.  Blood pressure increased with fluid resuscitation.   Assessment & Plan:   Principal Problem:   Acute on chronic respiratory failure with hypoxia (HCC) Active Problems:   Thrombocytopathia ([place])   DM (diabetes mellitus) ([place])   Hypothyroid   Dysphagia, J tube placed   Copd Golds C   HCAP (healthcare-associated pneumonia)   Aspiration pneumonia ([place])   Chronic atrial fibrillation (HCC)  Pacemaker   Sepsis ([place])   Acute on chronic respiratory failure with hypoxia and mild sepsis: likely due to aspiration pneumonia versus HCAP in the setting of COPD.  - Will place on telemetry bed - IV Vancomycin and Zosyn - cough syrup for cough  - prn Albuterol Nebs, DuoNeb, Xopenex Neb prn for SOB - Urine legionella and S. pneumococcal antigen pending - Follow up blood culture x2, sputum culture and respiratory virus panel - Flu PCR negative for Flu A and Flu B - will get Procalcitonin and trend lactic acid level per sepsis protocol - IVF: 1.5L of NS bolus in ED, followed by 100 mL per hour of NS  - continuing IVF of 168ml/hr  - SLP  Dysphagia - upon reviewing notes patient know to be a significant aspiration risk - patient continues to eat and drink at home despite risk - SLP consulted- to do modified barium swallow test today  COPD: no acute exacerbation. -DuoNeb nebulizer, when necessary albuterol -Pulmicort nebulizer  Thrombocytopathia ([place]): Platelet 96. No bleeding tendency. -Follow-up CBCD in am  DM-II: Last A1c 6.9 on 09/11/14, well controled. Patient is taking Linagliptin at home -SSI  Hypothyroidism: Last TSH was 2.96 on 09/11/14 -Continue home Synthroid  Atrial Fibrillation: CHA2DS2-VASc Score is 4, needs oral anticoagulation, but patient is not on AC possibly due to history of GI bleeding. Heart rate is well controlled. -Telemetry monitor -Continue aspirin  protein-calorie malutrition-severe -Nutritional supplements per tube - nutrition Consultation  Goals of Care - lengthy discussion with patient and his daughter - patient continues to state he wants to eat and drink even though he and his daughter know he aspirates -  he has decided to be a DNR - patient to think about what he would like his treatment to be in terms of whether he would like to be comfort driven and allowed to eat and drink or be kept NPO (pending results of SLP modified  barium)   DVT ppx: SCD Code Status: DNR Family Communication: Daughter is bedside Disposition Plan:  Anticipate discharge back to previous home environment   Consultants:   PCCM  SLP  PT  Procedures:   None  Antimicrobials:   Zosyn  Vancomycin    Subjective: Patient seen with nurse.  He responded well to fluid bolus.  Lengthy discussion with patient about risks of aspirating.  Discussed that notes from a few years ago stated patient had significant risks with eating and drinking.  Patient states he enjoys eating and drinking a little at home and that this is the only way to keep his weight up.  Voices that he doesn't know if he can commit to not eating again.  Wants barium swallow study to see if his dysphagia has improved.  Objective: Vitals:   11/03/16 0652 11/03/16 0835 11/03/16 0847 11/03/16 0856  BP:  106/72 (!) 98/59 (!) 110/52  Pulse:  (!) 54    Resp:      Temp:      TempSrc:      SpO2: 99%     Weight:      Height:        Intake/Output Summary (Last 24 hours) at 11/03/16 1212 Last data filed at 11/03/16 [id number]  Gross per 24 hour  Intake           561.67 ml  Output                0 ml  Net           561.67 ml   Filed Weights   11/02/16 2222 11/03/16 0200  Weight: 54.9 kg (121 lb) 57.1 kg (125 lb 12.8 oz)    Examination:  General exam: Appears calm and comfortable  Respiratory system: decreased air movement bilaterally, no rales or wheezing appreciated.  Rhonchi noted in the upper lung fields Cardiovascular system: S1 & S2 heard, IRRR. No JVD, murmurs, rubs, gallops or clicks. No pedal edema. Gastrointestinal system: Abdomen is nondistended, soft and nontender. No organomegaly or masses felt. Normal bowel sounds heard.  G-J tube in place with no surrounding erythema or leakage noted. Central nervous system: Alert and oriented. No focal neurological deficits. Extremities: Symmetric 5 x 5 power. Skin: No rashes, lesions or ulcers Psychiatry:  Judgement and insight appear normal. Mood & affect appropriate.     Data Reviewed: I have personally reviewed following labs and imaging studies  CBC:  Recent Labs Lab 11/02/16 2311  WBC 6.4  NEUTROABS 5.4  HGB 14.5  HCT 42.7  MCV 97.3  PLT 96*   Basic Metabolic Panel:  Recent Labs Lab 11/02/16 2311 11/03/16 0338  NA 138  --   K 3.9  --   CL 105  --   CO2 25  --   GLUCOSE 115*  --   BUN 24*  --   CREATININE 0.97  --   CALCIUM 9.2  --   MG  --  1.4*   GFR: Estimated Creatinine Clearance: 46.6 mL/min (by C-G formula based on SCr of 0.97 mg/dL). Liver Function Tests:  Recent Labs Lab 11/02/16 2311  AST 44*  ALT 18  ALKPHOS 126  BILITOT 2.2*  PROT 7.8  ALBUMIN 4.0   No results for input(s): LIPASE, AMYLASE in the last 168 hours. No results for input(s): AMMONIA in the last 168 hours. Coagulation Profile: No results for input(s): INR, PROTIME in the last 168 hours. Cardiac Enzymes: No results for input(s): CKTOTAL, CKMB, CKMBINDEX, TROPONINI in the last 168 hours. BNP (last 3 results) No results for input(s): PROBNP in the last 8760 hours. HbA1C: No results for input(s): HGBA1C in the last 72 hours. CBG:  Recent Labs Lab 11/03/16 0257  GLUCAP 125*   Lipid Profile: No results for input(s): CHOL, HDL, LDLCALC, TRIG, CHOLHDL, LDLDIRECT in the last 72 hours. Thyroid Function Tests: No results for input(s): TSH, T4TOTAL, FREET4, T3FREE, THYROIDAB in the last 72 hours. Anemia Panel: No results for input(s): VITAMINB12, FOLATE, FERRITIN, TIBC, IRON, RETICCTPCT in the last 72 hours. Sepsis Labs:  Recent Labs Lab 11/02/16 2308 11/03/16 0120 11/03/16 0349  PROCALCITON  --  3.14  --   LATICACIDVEN 1.3 1.3 1.0    No results found for this or any previous visit (from the past 240 hour(s)).       Radiology Studies: Dg Abd Acute W/chest  Result Date: 11/03/2016 CLINICAL DATA:  Fever and nausea today. EXAM: DG ABDOMEN ACUTE W/ 1V CHEST COMPARISON:   10/31/2015, 09/05/2016. FINDINGS: Airspace consolidation in the left lower lobe is increased from recent studies and may represent pneumonia. There also is a small left pleural effusion. The right lung is clear. The transvenous cardiac pacing lead appears intact. Pulmonary vasculature is normal. Percutaneous feeding gastrojejunostomy appears satisfactorily positioned. Abdominal gas pattern is negative for obstruction or perforation. IMPRESSION: 1. Left lower lobe airspace consolidation, suspicious for pneumonia. Probable small left effusion. 2. Negative for bowel obstruction or perforation. 3. Percutaneous gastrojejunostomy appears satisfactorily positioned. Electronically Signed   By: Andreas Newport M.D.   On: 11/03/2016 00:05        Scheduled Meds: . aspirin  81 mg Per Tube QHS  . B-complex with vitamin C  1 tablet Oral Daily  . budesonide  0.25 mg Nebulization BID  . cyproheptadine  4 mg Per Tube QHS  . insulin aspart  0-5 Units Subcutaneous QHS  . insulin aspart  0-9 Units Subcutaneous TID WC  . ipratropium-albuterol  3 mL Nebulization TID  . levothyroxine  88 mcg Per Tube QAC breakfast  . oxyCODONE  7.5-15 mg Per Tube BID  . piperacillin-tazobactam (ZOSYN)  IV  3.375 g Intravenous Q8H  . polyethylene glycol  8.5 g Per Tube Daily  . sodium chloride  1,000 mL Intravenous Once  . vancomycin  500 mg Intravenous Q12H   Continuous Infusions: . sodium chloride 100 mL/hr at 11/03/16 0118  . feeding supplement (OSMOLITE 1.5 CAL) 1,000 mL (11/03/16 0336)     LOS: 0 days    Time spent: 35 minutes    Newman Pies, MD [facility] Pager [phone]  If 7PM-7AM, please contact night-coverage [email or web address] Password [email or web address] 11/03/2016, 12:12 PM

## 2016-11-03 NOTE — Progress Notes (Signed)
Modified Barium Swallow Progress Note  Patient Details  Name: [name] MRN: [id number] Date of Birth: [birth year]  Today's Date: 11/03/2016  Modified Barium Swallow completed.  Full report located under Chart Review in the Imaging Section.  Brief recommendations include the following:  Clinical Impression  Patient presents with mild oral and severe pharyngeal dysphagia with sensorimotor deficits.  Lingual pumping and poor oral organization results in delayed transiting and piece-mealing.    Pharyngeal swallow characterized by poor epiglottis deflection/laryngeal elevation resulting in severe pharyngeal residuals and constant laryngeal penetration.   Pt independently conducts extended breath-hold with double swallows to aid pharyngeal clearance.  Also, throat clearing helpful to reduce laryngeal penetration - as larynx never fully clears.  In addition, he frequently propelled residuals from tongue base/vallecular space into oral cavity and either re=swallowed or expectorated.  Increased viscosity of boluses caused more residuals which was precarious as barium pudding lodged in pharynx close to airway without patient immediate attempts to clear.   He frequently expectorated throughout evaluation - which does effectively decrease pharyngeal residuals by up to 90%!  Pt admits he does this expectoration frequently at home with intake.  Using live video, reinforced these effective compensation strategies to patient for maximal airway protection.    Of note, patient did report he ate a gravy/sausage biscuit a few days ago and admits his coughed with it.  He does report that he "coughs with everything".  Pt reports agreement to consumption of liquids with MD approves with strict precautions and use of feeding tube for nutritional support. This pt again will be chronic aspiration risk but mitigation is possible with strategies.     Unfortunately patient's daughter Sharyn Lull did not attend MBS though SLP  invited her earlier this am.  SLP to follow up.  Thanks for allowing me to help care for this patient.     Swallow Evaluation Recommendations       SLP Diet Recommendations:  (liquids if pt desires with known risks and mitigation strategies)   Liquid Administration via: No straw;Cup;Spoon   Medication Administration: Via alternative means       Compensations: Small sips/bites;Slow rate;Multiple dry swallows after each bite/sip;Hard cough after swallow;Other (Comment) ("hock" and expectorate)   Postural Changes: Seated upright at 90 degrees;Remain semi-upright after after feeds/meals (Comment)   Oral Care Recommendations: Oral care QID   Other Recommendations: Have oral suction available    Claudie Fisherman, [facility] SLP [phone]

## 2016-11-03 NOTE — Evaluation (Signed)
Physical Therapy Evaluation Patient Details Name: [name] MRN: [id number] DOB: 08-May-1931 Today's Date: 11/03/2016   History of Present Illness  82 y.o. male with medical history significant of hypertension, hyperlipidemia, diabetes mellitus, GIB, COPD, hypothyroidism, thrombocytopenia, s/p of pacemaker placement, A. fib not on anticoagulants, hepatocellular carcinoma (S/p of right lobe resection 2012), dissecting abdominal arterial aneurysm, chronic back pain, dysphagia, s/p of PEG tube placement and admitted for acute on chronic respiratory failure with hypoxia  Clinical Impression  Pt admitted with above diagnosis. Pt currently with functional limitations due to the deficits listed below (see PT Problem List).  Pt will benefit from skilled PT to increase their independence and safety with mobility to allow discharge to the venue listed below.  Pt mobilizing well with RW.  Pt typically not on oxygen at home and remained on 2L today.  Daughter present and reports she is typically with pt 80% of the time.     Follow Up Recommendations Home health PT    Equipment Recommendations  None recommended by PT    Recommendations for Other Services       Precautions / Restrictions Precautions Precautions: Fall Precaution Comments: PEG      Mobility  Bed Mobility Overal bed mobility: Needs Assistance Bed Mobility: Sit to Supine       Sit to supine: Supervision   General bed mobility comments: sitting EOB on arrival, supervision for return to supine due to lines  Transfers Overall transfer level: Needs assistance Equipment used: Rolling walker (2 wheeled) Transfers: Sit to/from Stand Sit to Stand: Min guard         General transfer comment: min/guard for safety  Ambulation/Gait Ambulation/Gait assistance: Min guard Ambulation Distance (Feet): 200 Feet Assistive device: Rolling walker (2 wheeled) Gait Pattern/deviations: Step-through pattern;Decreased stride length     General Gait Details: pt uses RW well, maintained 2L O2 [place] (however pt does not use O2 at home)  Financial trader [place] (Stroke Patients Only)       Balance Overall balance assessment: History of Falls (typically with stairs (so he uses ramp) and with turns)                                           Pertinent Vitals/Pain Pain Assessment: No/denies pain Pain Score:  (not rated) Pain Location: pt reports back pain occasionally, states none currently  Pain Intervention(s): Monitored during session    Home Living Family/patient expects to be discharged to:: Private residence Living Arrangements: Other (Comment) (daughter states pt lives with "another man") Available Help at Discharge: Family Type of Home: House Home Access: Ramped entrance;Stairs to enter   Technical brewer of Steps: 3 Home Layout: One level Home Equipment: Environmental consultant - 2 wheels      Prior Function Level of Independence: Independent with assistive device(s)               Hand Dominance        Extremity/Trunk Assessment               Lower Extremity Assessment: Generalized weakness         Communication   Communication: HOH  Cognition Arousal/Alertness: Awake/alert Behavior During Therapy: WFL for tasks assessed/performed Overall Cognitive Status: Within Functional Limits for tasks assessed  General Comments      Exercises     Assessment/Plan    PT Assessment Patient needs continued PT services  PT Problem List Decreased strength;Decreased activity tolerance;Decreased knowledge of use of DME;Decreased mobility;Cardiopulmonary status limiting activity          PT Treatment Interventions Gait training;DME instruction;Therapeutic activities;Therapeutic exercise;Functional mobility training;Patient/family education    PT Goals (Current goals can be found in the Care Plan section)  Acute  Rehab PT Goals PT Goal Formulation: With patient Time For Goal Achievement: 11/10/16 Potential to Achieve Goals: Good    Frequency Min 3X/week   Barriers to discharge        Co-evaluation               End of Session Equipment Utilized During Treatment: Gait belt;Oxygen Activity Tolerance: Patient tolerated treatment well Patient left: in bed;with call bell/phone within reach;with bed alarm set Nurse Communication: Mobility status         Time: [id number] PT Time Calculation (min) (ACUTE ONLY): 25 min   Charges:   PT Evaluation $PT Eval Low Complexity: 1 Procedure     PT G Codes:        Luwanna Brossman,KATHrine E 11/03/2016, 12:50 PM Carmelia Bake, PT, DPT 11/03/2016 Pager: [phone]

## 2016-11-03 NOTE — Significant Event (Signed)
Rapid Response Event Note  Overview: Time Called: 0825 Arrival Time: 0830 Event Type: Hypotension  Initial Focused Assessment: Pt A&O, lying in bed at present, appears in no acute distress.  Skin is warm and very dry.  C/O leg cramp in rt leg wanting to stand to relieve the pain.     Interventions:  Placed on monitor with A fib, slow rate in the 50's, pt has pacer.  BP taken in both arms, and orthostatic vitals done, pt able to stand and relieve some of the leg cramps.  Plan of Care (if not transferred): Continue to monitor BP closely on floor. Relayed to bedside RN to call if she needs any assistance with monitoring or his BP drops again.   Event Summary:  BP 110/52  HR 56, pt asking if he can go home,  Appears stable at this time.   at      at          Mc[place]ney, Jae Dire

## 2016-11-03 NOTE — Progress Notes (Signed)
Pt. Had in/out cath at 1500 and unable to void since. Bladder scan showed 280 mL. MD notified, order for in/out cath requested. Will continue to monitor and carry out new orders.

## 2016-11-03 NOTE — ED Provider Notes (Signed)
Medical screening examination/treatment/procedure(s) were conducted as a shared visit with non-physician practitioner(s) and myself.  I personally evaluated the patient during the encounter.  Seen and personally examined.  Agree with Charlann Lange HPI  BP 127/65 (BP Location: Right Arm)   Pulse 93   Temp 97.4 F (36.3 C) (Oral)   Resp 16   Ht 5\' 9"  (1.753 m)   Wt 125 lb 12.8 oz (57.1 kg)   SpO2 99%   BMI 18.58 kg/m   General Appearance:    Alert, cooperative, no distress, appears stated age  Head:    Normocephalic, without obvious abnormality, atraumatic  Eyes:    PERRL, conjunctiva/corneas clear, EOM's intact, fundi    benign, both eyes             Throat:   Lips, dry mucosa, and tongue normal  Neck:   Supple, symmetrical, trachea midline, no adenopathy;       thyroid:  No enlargement/tenderness/nodules; no carotid   bruit or JVD     Lungs:     Diminished B, respirations unlabored  Chest wall:    No tenderness or deformity  Heart:    Regular rate and rhythm, S1 and S2 normal, no murmur, rub   or gallop  Abdomen:     Soft, non-tender, bowel sounds active all four quadrants,    no masses, no organomegaly        Extremities:   Extremities normal, atraumatic, no cyanosis or edema  Pulses:   2+ and symmetric all extremities  Skin:   Skin color, texture, turgor normal, no rashes or lesions  Lymph nodes:   Cervical, supraclavicular, and axillary nodes normal    Plan admit       Marquay Kruse, MD 11/03/16 [id number]

## 2016-11-04 DIAGNOSIS — E038 Other specified hypothyroidism: Secondary | ICD-10-CM

## 2016-11-04 DIAGNOSIS — J9621 Acute and chronic respiratory failure with hypoxia: Secondary | ICD-10-CM

## 2016-11-04 LAB — RESPIRATORY PANEL BY PCR
ADENOVIRUS-RVPPCR: NOT DETECTED
Bordetella pertussis: NOT DETECTED
CORONAVIRUS 229E-RVPPCR: NOT DETECTED
CORONAVIRUS HKU1-RVPPCR: NOT DETECTED
CORONAVIRUS NL63-RVPPCR: NOT DETECTED
CORONAVIRUS OC43-RVPPCR: NOT DETECTED
Chlamydophila pneumoniae: NOT DETECTED
INFLUENZA B-RVPPCR: NOT DETECTED
Influenza A: NOT DETECTED
METAPNEUMOVIRUS-RVPPCR: NOT DETECTED
MYCOPLASMA PNEUMONIAE-RVPPCR: NOT DETECTED
PARAINFLUENZA VIRUS 1-RVPPCR: NOT DETECTED
PARAINFLUENZA VIRUS 2-RVPPCR: NOT DETECTED
Parainfluenza Virus 3: NOT DETECTED
Parainfluenza Virus 4: NOT DETECTED
Respiratory Syncytial Virus: NOT DETECTED
Rhinovirus / Enterovirus: NOT DETECTED

## 2016-11-04 LAB — GLUCOSE, CAPILLARY
GLUCOSE-CAPILLARY: 194 mg/dL — AB (ref 65–99)
Glucose-Capillary: 154 mg/dL — ABNORMAL HIGH (ref 65–99)
Glucose-Capillary: 185 mg/dL — ABNORMAL HIGH (ref 65–99)
Glucose-Capillary: 141 mg/dL — ABNORMAL HIGH (ref 65–99)

## 2016-11-04 LAB — STREP PNEUMONIAE URINARY ANTIGEN: STREP PNEUMO URINARY ANTIGEN: NEGATIVE

## 2016-11-04 MED ORDER — MAGNESIUM SULFATE 2 GM/50ML IV SOLN
2.0000 g | Freq: Once | INTRAVENOUS | Status: AC
  Administered 2016-11-04: 2 g via INTRAVENOUS
  Filled 2016-11-04: qty 50

## 2016-11-04 MED ORDER — JEVITY 1.2 CAL PO LIQD: 1000.0000 mL | ORAL | Status: DC

## 2016-11-04 NOTE — Progress Notes (Signed)
PROGRESS NOTE    [name]  [id number] DOB: Jan 31, 1931 DOA: 11/02/2016 PCP: Conni Slipper, NP    Brief Narrative:  [name] is a 81 y.o. male with medical history significant of hypertension, hyperlipidemia, diabetes mellitus, GIB, COPD, hypothyroidism, thrombocytopenia, s/p of pacemaker placement, A. fib not on anticoagulants, hepatocellular carcinoma (S/p of right lobe resection 2012), dissecting abdominal arterial aneurysm, chronic back pain, dysphagia, s/p of PEG tube placement, who presents with fever, cough, shortness of breath.  Pt states that his symptoms started today. He has fever, cough and shortness of breath. He had fever of 104.2 (Temp was taken in the ear by her daughter). He coughs up white colored mucus, denies chest pain. He has nausea, but no vomiting. Patient states that he had PEG tube replaced at [facility] today. Patient does not have abdominal pain, diarrhea, symptoms of UTI or unilateral weakness. He states that he still eating liquid food through mouth some times.  ED Course: pt was found to have WBC 6.4, lactate 1.5, temperature 102.9, any urinalysis, electrolytes renal function okay, oxygen saturation 87 on room air, which improved to 92 on 2 L oxygen. X-ray of acute abdomen/chest showed left lower lobe infiltration. pt is admitted to telemetry bed as inpatient.  Patient admitted on in the morning of 11/4 developed hypotension with BP in the 70s/20s; he remained cognitive throughout period of hypotension.  Additional bolus of fluid given and PCCM was consulted.  Blood pressure increased with fluid resuscitation.  Patient underwent MBS test in order to assess risk of aspiration.   Assessment & Plan:   Principal Problem:   Acute on chronic respiratory failure with hypoxia (HCC) Active Problems:   Thrombocytopathia ([place])   DM (diabetes mellitus) ([place])   Hypothyroid   Dysphagia, J tube placed   Copd Golds C   HCAP (healthcare-associated pneumonia)  Aspiration pneumonia ([place])   Chronic atrial fibrillation (HCC)   Pacemaker   Sepsis ([place])   Acute on chronic respiratory failure with hypoxia and mild sepsis: likely due to aspiration pneumonia versus HCAP in the setting of COPD.  - Will place on telemetry bed - IV Vancomycin and Zosyn - cough syrup for cough  - prn Albuterol Nebs, DuoNeb, Xopenex Neb prn for SOB - S. pneumococcal antigen negative - urine legionella pending - Follow up blood culture x2, sputum culture and respiratory virus panel - Flu PCR negative for Flu A and Flu B - will get Procalcitonin and trend lactic acid level per sepsis protocol - IVF: 1.5L of NS bolus in ED, followed by 100 mL per hour of NS  - continuing IVF of 166ml/hr  - SLP  Dysphagia - upon reviewing notes patient know to be a significant aspiration risk - patient continues to eat and drink at home despite risk - SLP consulted - barium swallow performed yesterday - patient still has significant risk for aspiration but less risk is with thin liquids - patient has desire to drink and understands risks and is willing to take risk of possible further aspiration - will place on thin liquid diet  Hypomagnesemia - replace with 2g IV today  COPD: no acute exacerbation. -DuoNeb nebulizer, when necessary albuterol -Pulmicort nebulizer  Thrombocytopathia ([place]): Platelet 96. No bleeding tendency. -Follow-up CBCD in am  DM-II: Last A1c 6.9 on 09/11/14, well controled. Patient is taking Linagliptin at home -SSI  Hypothyroidism: Last TSH was 2.96 on 09/11/14 -Continue home Synthroid  Atrial Fibrillation: CHA2DS2-VASc Score is 4, needs oral anticoagulation, but patient is not  on [facility] possibly due to history of GI bleeding. Heart rate is well controlled. -Telemetry monitor -Continue aspirin  protein-calorie malutrition-severe -Nutritional supplements per tube - nutrition Consultation - continue Osmolite 1.5@ 46ml/hr  Goals of Care - lengthy  discussion with patient and his daughter - patient continues to state he wants to eat and drink even though he and his daughter know he aspirates - he has decided to be a DNR - patient to think about what he would like his treatment to be- states he understands risk of aspiration and would still like to try to drink thin liquids   DVT ppx: SCD Code Status: DNR Family Communication: Daughter is bedside Disposition Plan:  Anticipate discharge back to previous home environment   Consultants:   PCCM  SLP  PT  Procedures:   None  Antimicrobials:   Zosyn 11/4>  Vancomycin 11/4>    Subjective: Patient asleep at time of evaluation.  Awoke and asked if he could have Jello instead of just liquids.  Voices he knows that he has risks of aspirating anything besides thin liquids.  Slept well.  No chest pain or chest pressure.  Does not feel feverish.   Objective: Vitals:   11/04/16 0539 11/04/16 0751 11/04/16 0756 11/04/16 1500  BP: (!) 113/43   (!) 114/52  Pulse: 63   (!) 101  Resp: 16   (!) 22  Temp: 100 F (37.8 C)   97.6 F (36.4 C)  TempSrc: Oral   Oral  SpO2: 96% 99% 99% 95%  Weight:      Height:        Intake/Output Summary (Last 24 hours) at 11/04/16 1616 Last data filed at 11/04/16 1500  Gross per 24 hour  Intake          1451.67 ml  Output             1025 ml  Net           426.67 ml   Filed Weights   11/02/16 2222 11/03/16 0200  Weight: 54.9 kg (121 lb) 57.1 kg (125 lb 12.8 oz)    Examination:  General exam: Appears calm and comfortable  Respiratory system: decreased air movement bilaterally, rales in the left middle and lower lung fields, rhonchi in upper lung fields bilaterally Cardiovascular system: S1 & S2 heard, IRRR. No JVD, murmurs, rubs, gallops or clicks. No pedal edema. Gastrointestinal system: Abdomen is nondistended, soft and nontender. No organomegaly or masses felt. Normal bowel sounds heard.  G-J tube in place with no surrounding  erythema or leakage noted. Central nervous system: Alert and oriented. No focal neurological deficits. Extremities: Symmetric 5 x 5 power. Skin: No rashes, lesions or ulcers Psychiatry: Judgement and insight appear normal. Mood & affect appropriate.     Data Reviewed: I have personally reviewed following labs and imaging studies  CBC:  Recent Labs Lab 11/02/16 2311  WBC 6.4  NEUTROABS 5.4  HGB 14.5  HCT 42.7  MCV 97.3  PLT 96*   Basic Metabolic Panel:  Recent Labs Lab 11/02/16 2311 11/03/16 0338  NA 138  --   K 3.9  --   CL 105  --   CO2 25  --   GLUCOSE 115*  --   BUN 24*  --   CREATININE 0.97  --   CALCIUM 9.2  --   MG  --  1.4*   GFR: Estimated Creatinine Clearance: 46.6 mL/min (by C-G formula based on SCr of 0.97 mg/dL). Liver Function  Tests:  Recent Labs Lab 11/02/16 2311  AST 44*  ALT 18  ALKPHOS 126  BILITOT 2.2*  PROT 7.8  ALBUMIN 4.0   No results for input(s): LIPASE, AMYLASE in the last 168 hours. No results for input(s): AMMONIA in the last 168 hours. Coagulation Profile: No results for input(s): INR, PROTIME in the last 168 hours. Cardiac Enzymes: No results for input(s): CKTOTAL, CKMB, CKMBINDEX, TROPONINI in the last 168 hours. BNP (last 3 results) No results for input(s): PROBNP in the last 8760 hours. HbA1C: No results for input(s): HGBA1C in the last 72 hours. CBG:  Recent Labs Lab 11/03/16 1336 11/03/16 1648 11/03/16 2134 11/04/16 0803 11/04/16 1201  GLUCAP 164* 158* 115* 194* 154*   Lipid Profile: No results for input(s): CHOL, HDL, LDLCALC, TRIG, CHOLHDL, LDLDIRECT in the last 72 hours. Thyroid Function Tests: No results for input(s): TSH, T4TOTAL, FREET4, T3FREE, THYROIDAB in the last 72 hours. Anemia Panel: No results for input(s): VITAMINB12, FOLATE, FERRITIN, TIBC, IRON, RETICCTPCT in the last 72 hours. Sepsis Labs:  Recent Labs Lab 11/02/16 2308 11/03/16 0120 11/03/16 0349  PROCALCITON  --  3.14  --     LATICACIDVEN 1.3 1.3 1.0    Recent Results (from the past 240 hour(s))  Culture, blood (routine x 2)     Status: None (Preliminary result)   Collection Time: 11/02/16 11:10 PM  Result Value Ref Range Status   Specimen Description BLOOD LEFT ANTECUBITAL  Final   Special Requests BOTTLES DRAWN AEROBIC AND ANAEROBIC 5ML  Final   Culture   Final    NO GROWTH 1 DAY Performed at [facility]    Report Status PENDING  Incomplete  Culture, blood (routine x 2)     Status: None (Preliminary result)   Collection Time: 11/02/16 11:30 PM  Result Value Ref Range Status   Specimen Description BLOOD BLOOD LEFT FOREARM  Final   Special Requests BOTTLES DRAWN AEROBIC AND ANAEROBIC 5ML EA  Final   Culture   Final    NO GROWTH 1 DAY Performed at [facility]    Report Status PENDING  Incomplete  Respiratory Panel by PCR     Status: None   Collection Time: 11/04/16 12:09 AM  Result Value Ref Range Status   Adenovirus NOT DETECTED NOT DETECTED Final   Coronavirus 229E NOT DETECTED NOT DETECTED Final   Coronavirus HKU1 NOT DETECTED NOT DETECTED Final   Coronavirus NL63 NOT DETECTED NOT DETECTED Final   Coronavirus OC43 NOT DETECTED NOT DETECTED Final   Metapneumovirus NOT DETECTED NOT DETECTED Final   Rhinovirus / Enterovirus NOT DETECTED NOT DETECTED Final   Influenza A NOT DETECTED NOT DETECTED Final   Influenza B NOT DETECTED NOT DETECTED Final   Parainfluenza Virus 1 NOT DETECTED NOT DETECTED Final   Parainfluenza Virus 2 NOT DETECTED NOT DETECTED Final   Parainfluenza Virus 3 NOT DETECTED NOT DETECTED Final   Parainfluenza Virus 4 NOT DETECTED NOT DETECTED Final   Respiratory Syncytial Virus NOT DETECTED NOT DETECTED Final   Bordetella pertussis NOT DETECTED NOT DETECTED Final   Chlamydophila pneumoniae NOT DETECTED NOT DETECTED Final   Mycoplasma pneumoniae NOT DETECTED NOT DETECTED Final    Comment: Performed at [facility]         Radiology Studies: Dg Abd  Acute W/chest  Result Date: 11/03/2016 CLINICAL DATA:  Fever and nausea today. EXAM: DG ABDOMEN ACUTE W/ 1V CHEST COMPARISON:  10/31/2015, 09/05/2016. FINDINGS: Airspace consolidation in the left lower lobe is  increased from recent studies and may represent pneumonia. There also is a small left pleural effusion. The right lung is clear. The transvenous cardiac pacing lead appears intact. Pulmonary vasculature is normal. Percutaneous feeding gastrojejunostomy appears satisfactorily positioned. Abdominal gas pattern is negative for obstruction or perforation. IMPRESSION: 1. Left lower lobe airspace consolidation, suspicious for pneumonia. Probable small left effusion. 2. Negative for bowel obstruction or perforation. 3. Percutaneous gastrojejunostomy appears satisfactorily positioned. Electronically Signed   By: Andreas Newport M.D.   On: 11/03/2016 00:05   Dg Swallowing Func-speech Pathology  Result Date: 11/03/2016 Objective Swallowing Evaluation: Type of Study: MBS-Modified Barium Swallow Study Patient Details Name: [name] MRN: [id number] Date of Birth: 1931-11-02 Today's Date: 11/03/2016 Time: SLP Start Time (ACUTE ONLY): 1245-SLP Stop Time (ACUTE ONLY): 1325 SLP Time Calculation (min) (ACUTE ONLY): 40 min Past Medical History: Past Medical History: Diagnosis Date . Back pain  . Cancer ([place])  . COPD (chronic obstructive pulmonary disease) (HCC)   Gold C . Coronary artery disease  . Dissecting aortic aneurysm, abdominal ([place]) 11/13/2011  Overview:  Severe aortoiliac stenosis with possible a chronic dissection versus  ulcerated plaque/thrombus extending above and below the aortic  bifurcation; without aneurysmal dilation  as per CT abdomen noted 09/25/2011  . DM2 (diabetes mellitus, type 2) ([place])   takes Tradjenta daily . Dysphagia   s/p PEG placement . [place]shine (hepatocellular carcinoma) ([place])   s/p right lobe resection 2012 . Hyperlipidemia  . Hypertension  . Hypothyroidism   takes Synthroid daily  .  Pacemaker 11/11/2015  St. Jude single chamber pacemaker implanted at [facility] 11/2014. Marland Kitchen Paroxysmal atrial flutter (HCC)   and fibrillation with known bradycardia, Chads2vasc of 4, not on A/C due to thrombocytopenia and functional status . Peripheral vascular disease ([place])   LICA stent [id number] . Pneumonia 11/03/2012  aspiration pneumonias . Pneumothorax 10/2015 . Presence of permanent cardiac pacemaker  . Severe malnutrition ([place])  . Shortness of breath dyspnea   with exertion  . Thrombocytopenia ([place])  Past Surgical History: Past Surgical History: Procedure Laterality Date . CAROTID ANGIOGRAM  2012 . CAROTID STENT  2002 . cataract surgery     bilateral . COLONOSCOPY   . DOPPLER ECHOCARDIOGRAPHY  2011 . ESOPHAGOGASTRODUODENOSCOPY  10/01/2012  Procedure: ESOPHAGOGASTRODUODENOSCOPY (EGD);  Surgeon: Gwenyth Ober, MD;  Location: [place];  Service: General;  Laterality: N/A;  wyatt/leone . ESOPHAGOGASTRODUODENOSCOPY  10/31/2012  Procedure: ESOPHAGOGASTRODUODENOSCOPY (EGD);  Surgeon: Beryle Beams, MD;  Location: [facility] ENDOSCOPY;  Service: Endoscopy;  Laterality: N/A; . IR GENERIC HISTORICAL  07/25/2016  IR [place] DUODEN/JEJUNO TUBE PERCUT W/FLUORO 07/25/2016 Markus Daft, MD [facility]-INTERV RAD . IR GENERIC HISTORICAL  08/30/2016  IR GJ TUBE CHANGE 08/30/2016 Corrie Mckusick, DO [facility]INTER[facility] . IR GENERIC HISTORICAL  09/04/2016  IR CM INJ ANY COLONIC TUBE W/FLUORO 09/04/2016 [facility]INTER[facility] RAD . IR GENERIC HISTORICAL  09/20/2016  IR CM INJ ANY COLONIC TUBE W/FLUORO 09/20/2016 Corrie Mckusick, DO [facility] . IR GENERIC HISTORICAL  09/20/2016  IR CT SPINE LTD 09/20/2016 Corrie Mckusick, DO [facility] . liver canc   . pacemaker inserted   . PARS PLANA VITRECTOMY Right 11/14/2015  Procedure: PARS PLANA VITRECTOMY WITH 25 GAUGE, WITH ENDOLASER;  Surgeon: Hurman Horn, MD;  Location: [place];  Service: Ophthalmology;  Laterality: Right; . PEG PLACEMENT  10/01/2012  Procedure: PERCUTANEOUS ENDOSCOPIC GASTROSTOMY (PEG) PLACEMENT;  Surgeon: Gwenyth Ober, MD;   Location: [place];  Service: General;  Laterality: N/A; . PEG TUBE PLACEMENT  10/12/2015  replacment tube . POSTERIOR CERVICAL FUSION/FORAMINOTOMY  09/25/2012  Procedure: POSTERIOR CERVICAL FUSION/FORAMINOTOMY LEVEL 2;  Surgeon: Eustace Moore, MD;  Location: [place] NEURO ORS;  Service: Neurosurgery;  Laterality: N/A;  Posterior Cervical three-four laminectomy, posterior cervical three-four, four-five fusion HPI: 82 yo male with complex medical history including COPD, severe dysphagia s/p PEG - PEG just replaced yesterday at [facility], pt was febrile at home with ? vomiting.  admitted with fever, concern for pna.  Swallow evaluation ordered.  Pt has had several MBS and speech treatments/evaluations and has been on a diet at home despite aspiration risk.  Pt reports continued desire to eat.  Will plan MBS given last test in 2015 for pt and family education- Md, pt and family aware and agreeable.    Subjective: pt awake in chair Assessment / Plan / Recommendation CHL IP CLINICAL IMPRESSIONS 11/03/2016 Therapy Diagnosis Moderate oral phase dysphagia;Severe pharyngeal phase dysphagia Clinical Impression Patient presents with mild oral and severe pharyngeal dysphagia with sensorimotor deficits.  Lingual pumping and poor oral organization results in delayed transiting and piece-mealing.  Pharyngeal swallow characterized by poor epiglottis deflection/laryngeal elevation resulting in severe pharyngeal residuals and constant laryngeal penetration.   Pt independently conducts extended breath-hold with double swallows to aid pharyngeal clearance.  Also, throat clearing helpful to reduce laryngeal penetration - as larynx never fully clears.  In addition, he frequently propelled residuals from tongue base/vallecular space into oral cavity and either re=swallowed or expectorated.  Increased viscosity of boluses caused much more residuals which was precarious as barium pudding lodged in pharynx close to airway without patient  immediate attempts to clear.   He frequently expectorated throughout evaluation - which does effectively decrease pharyngeal residuals by up to 90%!  Pt admits he does this expectoration frequently at home with intake.  Using live video, reinforced these effective compensation strategies to patient for maximal airway protection.  Of note, patient did report he ate a gravy/sausage biscuit a few days ago and admits his coughed with it.  He does report that he "coughs with everything".  Pt reports agreement to consumption of liquids with MD approves with strict precautions and use of feeding tube for nutritional support.  Unfortunately patient's daughter Sharyn Lull did not attend MBS though SLP invited her earlier this am.  SLP to follow up.  Thanks for allowing me to help care for this patient.   Impact on safety and function --   CHL IP TREATMENT RECOMMENDATION 11/03/2016 Treatment Recommendations Therapy as outlined in treatment plan below   Prognosis 11/03/2016 Prognosis for Safe Diet Advancement Guarded Barriers to Reach Goals Severity of deficits;Time post onset Barriers/Prognosis Comment -- CHL IP DIET RECOMMENDATION 11/03/2016 SLP Diet Recommendations (No Data) Liquid Administration via No straw;Cup;Spoon Medication Administration Via alternative means Compensations Small sips/bites;Slow rate;Multiple dry swallows after each bite/sip;Hard cough after swallow;Other (Comment) Postural Changes Seated upright at 90 degrees;Remain semi-upright after after feeds/meals (Comment)   CHL IP OTHER RECOMMENDATIONS 11/03/2016 Recommended Consults -- Oral Care Recommendations Oral care QID Other Recommendations Have oral suction available   CHL IP FOLLOW UP RECOMMENDATIONS 10/12/2015 Follow up Recommendations None   CHL IP FREQUENCY AND DURATION 11/03/2016 Speech Therapy Frequency (ACUTE ONLY) min 1 x/week Treatment Duration 1 week      CHL IP ORAL PHASE 11/03/2016 Oral Phase Impaired Oral - Pudding Teaspoon -- Oral - Pudding Cup --  Oral - Honey Teaspoon -- Oral - Honey Cup -- Oral - Nectar Teaspoon Weak lingual manipulation;Lingual pumping;Delayed oral transit;Piecemeal swallowing;Decreased bolus cohesion Oral -  Nectar Cup Weak lingual manipulation;Lingual pumping;Delayed oral transit;Piecemeal swallowing;Decreased bolus cohesion Oral - Nectar Straw -- Oral - Thin Teaspoon Weak lingual manipulation;Lingual pumping;Reduced posterior propulsion;Lingual/palatal residue;Piecemeal swallowing;Delayed oral transit;Decreased bolus cohesion Oral - Thin Cup Weak lingual manipulation;Lingual pumping;Reduced posterior propulsion;Lingual/palatal residue;Piecemeal swallowing;Delayed oral transit;Premature spillage Oral - Thin Straw -- Oral - Puree Weak lingual manipulation;Lingual pumping;Reduced posterior propulsion;Delayed oral transit Oral - Mech Soft -- Oral - Regular -- Oral - Multi-Consistency -- Oral - Pill -- Oral Phase - Comment --  CHL IP PHARYNGEAL PHASE 11/03/2016 Pharyngeal Phase Impaired Pharyngeal- Pudding Teaspoon -- Pharyngeal -- Pharyngeal- Pudding Cup -- Pharyngeal -- Pharyngeal- Honey Teaspoon -- Pharyngeal -- Pharyngeal- Honey Cup -- Pharyngeal -- Pharyngeal- Nectar Teaspoon Reduced pharyngeal peristalsis;Reduced epiglottic inversion;Reduced laryngeal elevation;Reduced airway/laryngeal closure;Reduced tongue base retraction;Penetration/Aspiration during swallow;Pharyngeal residue - pyriform;Pharyngeal residue - posterior pharnyx Pharyngeal Material enters airway, remains ABOVE vocal cords and not ejected out Pharyngeal- Nectar Cup Reduced pharyngeal peristalsis;Reduced epiglottic inversion;Reduced laryngeal elevation;Reduced airway/laryngeal closure;Reduced tongue base retraction;Penetration/Aspiration during swallow;Pharyngeal residue - pyriform;Pharyngeal residue - valleculae Pharyngeal Material enters airway, remains ABOVE vocal cords and not ejected out Pharyngeal- Nectar Straw -- Pharyngeal -- Pharyngeal- Thin Teaspoon Reduced  laryngeal elevation;Reduced airway/laryngeal closure;Reduced tongue base retraction;Penetration/Aspiration during swallow;Reduced pharyngeal peristalsis;Reduced epiglottic inversion;Pharyngeal residue - valleculae;Pharyngeal residue - pyriform Pharyngeal Material enters airway, remains ABOVE vocal cords and not ejected out Pharyngeal- Thin Cup Reduced pharyngeal peristalsis;Delayed swallow initiation-pyriform sinuses;Reduced epiglottic inversion;Reduced laryngeal elevation;Reduced airway/laryngeal closure;Reduced tongue base retraction;Penetration/Aspiration during swallow;Pharyngeal residue - pyriform;Pharyngeal residue - valleculae Pharyngeal Material enters airway, CONTACTS cords and not ejected out;Material enters airway, remains ABOVE vocal cords and not ejected out Pharyngeal- Thin Straw -- Pharyngeal -- Pharyngeal- Puree Delayed swallow initiation-vallecula;Reduced pharyngeal peristalsis;Reduced epiglottic inversion;Reduced airway/laryngeal closure;Reduced laryngeal elevation;Reduced tongue base retraction;Penetration/Aspiration during swallow;Pharyngeal residue - valleculae;Pharyngeal residue - posterior pharnyx Pharyngeal Material enters airway, remains ABOVE vocal cords then ejected out Pharyngeal- Mechanical Soft -- Pharyngeal -- Pharyngeal- Regular -- Pharyngeal -- Pharyngeal- Multi-consistency -- Pharyngeal -- Pharyngeal- Pill -- Pharyngeal -- Pharyngeal Comment pt conducting extended breath hold during double swallows followed by cough/expectoration effective to swallow barium into esophagus, remove laryngeal penetrates and expel pharyngeal barium residuals; pt with much improved clearance of liquids vs solids, excessive expectoration required and fortunately pt able to conduct at this time    CHL IP CERVICAL ESOPHAGEAL PHASE 11/03/2016 Cervical Esophageal Phase Impaired Pudding Teaspoon -- Pudding Cup -- Honey Teaspoon -- Honey Cup -- Nectar Teaspoon Prominent cricopharyngeal segment Nectar Cup  Prominent cricopharyngeal segment Nectar Straw -- Thin Teaspoon Prominent cricopharyngeal segment Thin Cup Prominent cricopharyngeal segment Thin Straw -- Puree Prominent cricopharyngeal segment Mechanical Soft -- Regular -- Multi-consistency -- Pill -- Cervical Esophageal Comment -- CHL IP GO 10/12/2015 Functional Assessment Tool Used skilled clinical judgement Functional Limitations Swallowing Swallow Current Status [id number]) CM Swallow Goal Status [id number]) CM Swallow Discharge Status [id number]) CM Motor Speech Current Status [id number]) (None) Motor Speech Goal Status [id number]) (None) Motor Speech Goal Status [id number]) (None) Spoken Language Comprehension Current Status [id number]) (None) Spoken Language Comprehension Goal Status [id number]) (None) Spoken Language Comprehension Discharge Status [id number]) (None) Spoken Language Expression Current Status [id number]) (None) Spoken Language Expression Goal Status [id number]) (None) Spoken Language Expression Discharge Status [id number]) (None) Attention Current Status [id number]) (None) Attention Goal Status [id number]) (None) Attention Discharge Status [id number]) (None) Memory Current Status [id number]) (None) Memory Goal Status [id number]) (None) Memory Discharge Status [id number]) (None) Voice Current Status [id number]) (None) Voice Goal Status [id number]) (None) Voice Discharge Status [id number]) (None) Other Speech-Language Pathology Functional Limitation [id number]) (None) Other Speech-Language  Pathology Functional Limitation Goal Status [phone]) (None) Other Speech-Language Pathology Functional Limitation Discharge Status [id number]) (None) Luanna Salk, MS [facility] SLP [phone]                   Scheduled Meds: . aspirin  81 mg Per Tube QHS  . B-complex with vitamin C  1 tablet Oral Daily  . budesonide  0.25 mg Nebulization BID  . cyproheptadine  4 mg Per Tube QHS  . insulin aspart  0-5 Units Subcutaneous QHS  . insulin aspart  0-9 Units Subcutaneous TID WC  . ipratropium-albuterol  3 mL Nebulization TID  . levothyroxine  88  mcg Per Tube QAC breakfast  . oxyCODONE  7.5-15 mg Per Tube BID  . piperacillin-tazobactam (ZOSYN)  IV  3.375 g Intravenous Q8H  . polyethylene glycol  8.5 g Per Tube Daily  . sodium chloride  1,000 mL Intravenous Once  . vancomycin  500 mg Intravenous Q12H   Continuous Infusions: . sodium chloride 100 mL/hr at 11/04/16 0535  . feeding supplement (OSMOLITE 1.5 CAL) 1,000 mL (11/03/16 0336)     LOS: 1 day    Time spent: 35 minutes    Newman Pies, MD [facility] Pager [phone]  If 7PM-7AM, please contact night-coverage [email or web address] Password [email or web address] 11/04/2016, 4:16 PM

## 2016-11-04 NOTE — Progress Notes (Signed)
Initial Nutrition Assessment  DOCUMENTATION CODES:   Severe malnutrition in context of chronic illness, Underweight  INTERVENTION:   Continue Osmolite 1.5 @ 50 ml/hr. Provides 1800 kcal (100% of needs), 75g protein (100% of needs) and 916 ml H2O.  Encouraged sips of liquids as tolerated. RD to continue to monitor  NUTRITION DIAGNOSIS:   Malnutrition related to chronic illness as evidenced by severe depletion of body fat, severe depletion of muscle mass.  GOAL:   Patient will meet greater than or equal to 90% of their needs  MONITOR:   PO intake, Labs, Weight trends, TF tolerance, I & O's  REASON FOR ASSESSMENT:   Consult Enteral/tube feeding initiation and management  ASSESSMENT:   82 y.o. male with medical history significant of hypertension, hyperlipidemia, diabetes mellitus, GIB, COPD, hypothyroidism, thrombocytopenia, s/p of pacemaker placement, A. fib not on anticoagulants, hepatocellular carcinoma (S/p of right lobe resection 2012), dissecting abdominal arterial aneurysm, chronic back pain, dysphagia, s/p of PEG tube placement, who presents with fever, cough, shortness of breath.  Patient familiar to RD from previous admission in September. Pt in room with daughter at bedside. Pt states he has only had a sip of apple juice today and that is all PO. He recently had his G-J tube replaced at [facility] on 11/3. Pt's daughter states they have continued night feeds at home but switched to Osmolite 1.5 formula. Currently Osmolite 1.5 infusing at 50 ml/hr and pt is tolerating. Pt and family request to stay on 24 hours continuous feeds at this time. RD offered to switch to night feeds at any time during admission.   MBS completed 11/4 by SLP: pt with mild oral and severe pharyngeal dysphagia with sensorimotor deficits.  SLP recommends liquid diet with aspiration precautions.   Per chart review, pt's weight has remained stable.  Nutrition-Focused physical exam completed. Findings are  severe fat depletion, severe muscle depletion, and no edema.   Medications: B-complex w/ Vitamin C tablet daily, Mg sulfate IV once, Miralax packet daily Labs reviewed: CBGs: 154-194 Low Mg  Diet Order:  Diet full liquid Room service appropriate? Yes; Fluid consistency: Thin  Skin:  Reviewed, no issues  Last BM:  11/3  Height:   Ht Readings from Last 1 Encounters:  11/02/16 5\' 9"  (1.753 m)    Weight:   Wt Readings from Last 1 Encounters:  11/03/16 125 lb 12.8 oz (57.1 kg)    Ideal Body Weight:  72.7 kg  BMI:  Body mass index is 18.58 kg/m.  Estimated Nutritional Needs:   Kcal:  1600-1800  Protein:  75-85g  Fluid:  1.6-1.8L/day  EDUCATION NEEDS:   No education needs identified at this time  Clayton Bibles, MS, RD, LDN Pager: [phone] After Hours Pager: [phone]

## 2016-11-04 NOTE — Progress Notes (Signed)
In/out cath orders, Q8 PRN obtained from MD. Was able to get 350 mL urine, clear, amber colored, malodorous. Will continue to monitor pt.

## 2016-11-04 NOTE — Progress Notes (Signed)
J tube site assessed at the beginning of shift. The site was very irritated and was pressed against skin, causing skin break down and having minimal discharge around site. Site was cleansed with NS and gauze. Split gauze placed. Site was cleansed again at midnight and 0500. Split gauze replaced both times. Will continue to monitor.

## 2016-11-05 DIAGNOSIS — R1310 Dysphagia, unspecified: Secondary | ICD-10-CM

## 2016-11-05 LAB — COMPREHENSIVE METABOLIC PANEL
ALT: 16 U/L — ABNORMAL LOW (ref 17–63)
ANION GAP: 7 (ref 5–15)
AST: 38 U/L (ref 15–41)
Albumin: 2.4 g/dL — ABNORMAL LOW (ref 3.5–5.0)
Alkaline Phosphatase: 58 U/L (ref 38–126)
BUN: 20 mg/dL (ref 6–20)
CHLORIDE: 113 mmol/L — AB (ref 101–111)
CO2: 22 mmol/L (ref 22–32)
Calcium: 8 mg/dL — ABNORMAL LOW (ref 8.9–10.3)
Creatinine, Ser: 1 mg/dL (ref 0.61–1.24)
Glucose, Bld: 234 mg/dL — ABNORMAL HIGH (ref 65–99)
POTASSIUM: 3.7 mmol/L (ref 3.5–5.1)
SODIUM: 142 mmol/L (ref 135–145)
Total Bilirubin: 1 mg/dL (ref 0.3–1.2)
Total Protein: 5.1 g/dL — ABNORMAL LOW (ref 6.5–8.1)

## 2016-11-05 LAB — CBC WITH DIFFERENTIAL/PLATELET
Basophils Absolute: 0 10*3/uL (ref 0.0–0.1)
Basophils Relative: 0 %
EOS ABS: 0.1 10*3/uL (ref 0.0–0.7)
EOS PCT: 2 %
HCT: 33.6 % — ABNORMAL LOW (ref 39.0–52.0)
Hemoglobin: 11 g/dL — ABNORMAL LOW (ref 13.0–17.0)
LYMPHS ABS: 0.6 10*3/uL — AB (ref 0.7–4.0)
Lymphocytes Relative: 13 %
MCH: 32.9 pg (ref 26.0–34.0)
MCHC: 32.7 g/dL (ref 30.0–36.0)
MCV: 100.6 fL — ABNORMAL HIGH (ref 78.0–100.0)
MONO ABS: 0.5 10*3/uL (ref 0.1–1.0)
MONOS PCT: 11 %
Neutro Abs: 3.4 10*3/uL (ref 1.7–7.7)
Neutrophils Relative %: 74 %
PLATELETS: 76 10*3/uL — AB (ref 150–400)
RBC: 3.34 MIL/uL — AB (ref 4.22–5.81)
RDW: 14.2 % (ref 11.5–15.5)
WBC: 4.6 10*3/uL (ref 4.0–10.5)

## 2016-11-05 LAB — URINE CULTURE: Culture: NO GROWTH

## 2016-11-05 LAB — GLUCOSE, CAPILLARY
GLUCOSE-CAPILLARY: 186 mg/dL — AB (ref 65–99)
GLUCOSE-CAPILLARY: 204 mg/dL — AB (ref 65–99)
GLUCOSE-CAPILLARY: 214 mg/dL — AB (ref 65–99)
Glucose-Capillary: 227 mg/dL — ABNORMAL HIGH (ref 65–99)
Glucose-Capillary: 190 mg/dL — ABNORMAL HIGH (ref 65–99)
Glucose-Capillary: 180 mg/dL — ABNORMAL HIGH (ref 65–99)

## 2016-11-05 LAB — MAGNESIUM: MAGNESIUM: 1.9 mg/dL (ref 1.7–2.4)

## 2016-11-05 LAB — LEGIONELLA PNEUMOPHILA SEROGP 1 UR AG: L. PNEUMOPHILA SEROGP 1 UR AG: NEGATIVE

## 2016-11-05 MED ORDER — OXYCODONE HCL 5 MG PO TABS
7.5000 mg | ORAL_TABLET | Freq: Two times a day (BID) | ORAL | Status: DC | PRN
  Administered 2016-11-05: 15 mg
  Administered 2016-11-06 (×2): 7.5 mg
  Filled 2016-11-05 (×2): qty 2

## 2016-11-05 MED ORDER — AMOXICILLIN-POT CLAVULANATE 400-57 MG/5ML PO SUSR: 875.0000 mg | Freq: Two times a day (BID) | ORAL | Status: DC

## 2016-11-05 MED ORDER — VITAMINS A & D EX OINT
TOPICAL_OINTMENT | CUTANEOUS | Status: AC
  Administered 2016-11-05: 5
  Filled 2016-11-05: qty 5

## 2016-11-05 MED ORDER — AMOXICILLIN-POT CLAVULANATE 250-62.5 MG/5ML PO SUSR
875.0000 mg | Freq: Two times a day (BID) | ORAL | Status: DC
  Administered 2016-11-05 – 2016-11-09 (×8): 875 mg via ORAL
  Filled 2016-11-05 (×12): qty 17.5

## 2016-11-05 NOTE — Progress Notes (Signed)
SLP Cancellation Note  Patient Details Name: JASHAUN DUNSTER MRN: [id number] DOB: 05-22-1931   Cancelled treatment:       Reason Eval/Treat Not Completed: Other (comment) (pt sound asleep, open mouth breathing, will continue efforts)   Claudie Fisherman, [place] [facility] SLP [phone]

## 2016-11-05 NOTE — Progress Notes (Signed)
PROGRESS NOTE    [name]  [id number] DOB: 08/27/1931 DOA: 11/02/2016 PCP: Conni Slipper, NP    Brief Narrative:  [name] is a 82 y.o. male with medical history significant of hypertension, hyperlipidemia, diabetes mellitus, GIB, COPD, hypothyroidism, thrombocytopenia, s/p of pacemaker placement, A. fib not on anticoagulants, hepatocellular carcinoma (S/p of right lobe resection 2012), dissecting abdominal arterial aneurysm, chronic back pain, dysphagia, s/p of PEG tube placement, who presents with fever, cough, shortness of breath.  Pt states that his symptoms started today. He has fever, cough and shortness of breath. He had fever of 104.2 (Temp was taken in the ear by her daughter). He coughs up white colored mucus, denies chest pain. He has nausea, but no vomiting. Patient states that he had PEG tube replaced at [facility] today. Patient does not have abdominal pain, diarrhea, symptoms of UTI or unilateral weakness. He states that he still eating liquid food through mouth some times.  ED Course: pt was found to have WBC 6.4, lactate 1.5, temperature 102.9, any urinalysis, electrolytes renal function okay, oxygen saturation 87 on room air, which improved to 92 on 2 L oxygen. X-ray of acute abdomen/chest showed left lower lobe infiltration. pt is admitted to telemetry bed as inpatient.  Patient admitted on in the morning of 11/4 developed hypotension with BP in the 70s/20s; he remained cognitive throughout period of hypotension.  Additional bolus of fluid given and PCCM was consulted.  Blood pressure increased with fluid resuscitation.  Patient underwent MBS test in order to assess risk of aspiration.   Assessment & Plan:   Principal Problem:   Acute on chronic respiratory failure with hypoxia (HCC) Active Problems:   Thrombocytopathia ([place])   DM (diabetes mellitus) ([place])   Hypothyroid   Dysphagia, J tube placed   Copd Golds C   HCAP (healthcare-associated pneumonia)  Aspiration pneumonia ([place])   Chronic atrial fibrillation (HCC)   Pacemaker   Sepsis ([place])   Acute on chronic respiratory failure with hypoxia and mild sepsis: likely due to aspiration pneumonia versus HCAP in the setting of COPD.  - Will place on telemetry bed - IV Vancomycin and Zosyn - will transition to PO Augmentin liquid - cough syrup for cough  - prn Albuterol Nebs, DuoNeb, Xopenex Neb prn for SOB - S. pneumococcal antigen negative - urine legionella pending - Follow up blood culture x2- no growth x 2 days - urine culture negative, respiratory panel negative - Flu PCR negative for Flu A and Flu B - will get Procalcitonin and trend lactic acid level per sepsis protocol - IVF: 1.5L of NS bolus in ED, followed by 100 mL per hour of NS  - continuing IVF of 153ml/hr  - SLP to follow  Dysphagia - upon reviewing notes patient know to be a significant aspiration risk - patient continues to eat and drink at home despite risk - SLP consulted - barium swallow performed yesterday - patient still has significant risk for aspiration but less risk is with thin liquids - patient has desire to drink and understands risks and is willing to take risk of possible further aspiration - will place on thin liquid diet  Hypomagnesemia - 1.9 today  COPD: no acute exacerbation. -DuoNeb nebulizer, when necessary albuterol -Pulmicort nebulizer  Thrombocytopathia ([place]): Platelet 96. No bleeding tendency. -Follow-up CBCD in am  DM-II: Last A1c 6.9 on 09/11/14, well controlled. Patient is taking Linagliptin at home -SSI- will need to monitor closely   Hypothyroidism: Last TSH was 2.96 on  09/11/14 -Continue home Synthroid  Atrial Fibrillation: CHA2DS2-VASc Score is 4, needs oral anticoagulation, but patient is not on AC possibly due to history of GI bleeding. Heart rate is well controlled. -Telemetry monitor -Continue aspirin  Urinary Retention - Foley catheter placed - will keep  catheter in place for now - may try voiding trial prior to discharge  protein-calorie malutrition-severe -Nutritional supplements per tube - nutrition Consultation - continue Osmolite 1.5@ 34ml/hr  Goals of Care - lengthy discussion with patient and his daughter - patient continues to state he wants to eat and drink even though he and his daughter know he aspirates - he has decided to be a DNR - patient to think about what he would like his treatment to be- states he understands risk of aspiration and would still like to try to drink thin liquids   DVT ppx: SCD Code Status: DNR Family Communication: Daughter is bedside Disposition Plan:  Anticipate discharge back to previous home environment   Consultants:   PCCM  SLP  PT  Procedures:   None  Antimicrobials:   Zosyn 11/4>  Vancomycin 11/4>    Subjective: Patient asleep at time of evaluation. Per nursing staff he has been drowsy since receiving his pain medication.  Daughter later discussed pain medication with MD.  Will make pain medication PRN.  Foley catheter placed yesterday secondary to urinary retention and incomplete emptying of bladder.   Objective: Vitals:   11/05/16 0603 11/05/16 0729 11/05/16 0831 11/05/16 1351  BP: (!) 111/28 (!) 169/44    Pulse: (!) 53     Resp: 18     Temp: 99.6 F (37.6 C)     TempSrc: Oral     SpO2: 99%  97% 97%  Weight:      Height:        Intake/Output Summary (Last 24 hours) at 11/05/16 1425 Last data filed at 11/05/16 1313  Gross per 24 hour  Intake          5223.33 ml  Output              875 ml  Net          4348.33 ml   Filed Weights   11/02/16 2222 11/03/16 0200  Weight: 54.9 kg (121 lb) 57.1 kg (125 lb 12.8 oz)    Examination:  General exam: Appears calm and comfortable  Respiratory system: decreased air movement bilaterally, rales in the left middle and lower lung fields, rhonchi in upper lung fields bilaterally Cardiovascular system: S1 & S2 heard,  IRRR. No JVD, murmurs, rubs, gallops or clicks. No pedal edema. Gastrointestinal system: Abdomen is nondistended, soft and nontender. No organomegaly or masses felt. Normal bowel sounds heard.  G-J tube in place with no surrounding erythema or leakage noted. Central nervous system: Alert and oriented. No focal neurological deficits. Extremities: Symmetric 5 x 5 power. Skin: No rashes, lesions or ulcers Psychiatry: Judgement and insight appear normal. Mood & affect appropriate.     Data Reviewed: I have personally reviewed following labs and imaging studies  CBC:  Recent Labs Lab 11/02/16 2311 11/05/16 0354  WBC 6.4 4.6  NEUTROABS 5.4 3.4  HGB 14.5 11.0*  HCT 42.7 33.6*  MCV 97.3 100.6*  PLT 96* 76*   Basic Metabolic Panel:  Recent Labs Lab 11/02/16 2311 11/03/16 0338 11/05/16 0354  NA 138  --  142  K 3.9  --  3.7  CL 105  --  113*  CO2 25  --  22  GLUCOSE 115*  --  234*  BUN 24*  --  20  CREATININE 0.97  --  1.00  CALCIUM 9.2  --  8.0*  MG  --  1.4* 1.9   GFR: Estimated Creatinine Clearance: 45.2 mL/min (by C-G formula based on SCr of 1 mg/dL). Liver Function Tests:  Recent Labs Lab 11/02/16 2311 11/05/16 0354  AST 44* 38  ALT 18 16*  ALKPHOS 126 58  BILITOT 2.2* 1.0  PROT 7.8 5.1*  ALBUMIN 4.0 2.4*   No results for input(s): LIPASE, AMYLASE in the last 168 hours. No results for input(s): AMMONIA in the last 168 hours. Coagulation Profile: No results for input(s): INR, PROTIME in the last 168 hours. Cardiac Enzymes: No results for input(s): CKTOTAL, CKMB, CKMBINDEX, TROPONINI in the last 168 hours. BNP (last 3 results) No results for input(s): PROBNP in the last 8760 hours. HbA1C: No results for input(s): HGBA1C in the last 72 hours. CBG:  Recent Labs Lab 11/04/16 2005 11/05/16 0016 11/05/16 0540 11/05/16 0731 11/05/16 1155  GLUCAP 141* 186* 214* 227* 190*   Lipid Profile: No results for input(s): CHOL, HDL, LDLCALC, TRIG, CHOLHDL,  LDLDIRECT in the last 72 hours. Thyroid Function Tests: No results for input(s): TSH, T4TOTAL, FREET4, T3FREE, THYROIDAB in the last 72 hours. Anemia Panel: No results for input(s): VITAMINB12, FOLATE, FERRITIN, TIBC, IRON, RETICCTPCT in the last 72 hours. Sepsis Labs:  Recent Labs Lab 11/02/16 2308 11/03/16 0120 11/03/16 0349  PROCALCITON  --  3.14  --   LATICACIDVEN 1.3 1.3 1.0    Recent Results (from the past 240 hour(s))  Culture, blood (routine x 2)     Status: None (Preliminary result)   Collection Time: 11/02/16 11:10 PM  Result Value Ref Range Status   Specimen Description BLOOD LEFT ANTECUBITAL  Final   Special Requests BOTTLES DRAWN AEROBIC AND ANAEROBIC 5ML  Final   Culture   Final    NO GROWTH 2 DAYS Performed at [facility]    Report Status PENDING  Incomplete  Culture, blood (routine x 2)     Status: None (Preliminary result)   Collection Time: 11/02/16 11:30 PM  Result Value Ref Range Status   Specimen Description BLOOD BLOOD LEFT FOREARM  Final   Special Requests BOTTLES DRAWN AEROBIC AND ANAEROBIC 5ML EA  Final   Culture   Final    NO GROWTH 2 DAYS Performed at [facility]    Report Status PENDING  Incomplete  Urine culture     Status: None   Collection Time: 11/03/16 12:44 AM  Result Value Ref Range Status   Specimen Description URINE, RANDOM  Final   Special Requests NONE  Final   Culture NO GROWTH Performed at [facility]   Final   Report Status 11/05/2016 FINAL  Final  Respiratory Panel by PCR     Status: None   Collection Time: 11/04/16 12:09 AM  Result Value Ref Range Status   Adenovirus NOT DETECTED NOT DETECTED Final   Coronavirus 229E NOT DETECTED NOT DETECTED Final   Coronavirus HKU1 NOT DETECTED NOT DETECTED Final   Coronavirus NL63 NOT DETECTED NOT DETECTED Final   Coronavirus OC43 NOT DETECTED NOT DETECTED Final   Metapneumovirus NOT DETECTED NOT DETECTED Final   Rhinovirus / Enterovirus NOT DETECTED NOT  DETECTED Final   Influenza A NOT DETECTED NOT DETECTED Final   Influenza B NOT DETECTED NOT DETECTED Final   Parainfluenza Virus 1 NOT DETECTED NOT DETECTED Final   Parainfluenza  Virus 2 NOT DETECTED NOT DETECTED Final   Parainfluenza Virus 3 NOT DETECTED NOT DETECTED Final   Parainfluenza Virus 4 NOT DETECTED NOT DETECTED Final   Respiratory Syncytial Virus NOT DETECTED NOT DETECTED Final   Bordetella pertussis NOT DETECTED NOT DETECTED Final   Chlamydophila pneumoniae NOT DETECTED NOT DETECTED Final   Mycoplasma pneumoniae NOT DETECTED NOT DETECTED Final    Comment: Performed at [facility]         Radiology Studies: No results found.      Scheduled Meds: . aspirin  81 mg Per Tube QHS  . B-complex with vitamin C  1 tablet Oral Daily  . budesonide  0.25 mg Nebulization BID  . cyproheptadine  4 mg Per Tube QHS  . insulin aspart  0-5 Units Subcutaneous QHS  . insulin aspart  0-9 Units Subcutaneous TID WC  . ipratropium-albuterol  3 mL Nebulization TID  . levothyroxine  88 mcg Per Tube QAC breakfast  . piperacillin-tazobactam (ZOSYN)  IV  3.375 g Intravenous Q8H  . polyethylene glycol  8.5 g Per Tube Daily  . sodium chloride  1,000 mL Intravenous Once  . vancomycin  500 mg Intravenous Q12H   Continuous Infusions: . sodium chloride 100 mL/hr at 11/05/16 1313  . feeding supplement (OSMOLITE 1.5 CAL) 1,000 mL (11/05/16 1313)     LOS: 2 days    Time spent: 35 minutes    Newman Pies, MD [facility] Pager [phone]  If 7PM-7AM, please contact night-coverage [email or web address] Password [email or web address] 11/05/2016, 2:25 PM

## 2016-11-05 NOTE — Progress Notes (Signed)
OT Cancellation Note  Patient Details Name: BRADRICK LAFRENIER MRN: [id number] DOB: 10-17-1931   Cancelled Treatment:    Reason Eval/Treat Not Completed: Fatigue/lethargy limiting ability to participate  11/05/2016  Kari Baars, [place]   [name] 11/05/2016, 2:25 PM

## 2016-11-05 NOTE — Progress Notes (Signed)
PT Cancellation Note  Patient Details Name: [name] MRN: [id number] DOB: [birth year]   Cancelled Treatment:    Reason Eval/Treat Not Completed: Fatigue/lethargy limiting ability to participate (pt sleeping soundly, RN requested pt not be disturbed. Will follow. )   [name] 11/05/2016, 1:44 PM [phone]

## 2016-11-06 ENCOUNTER — Inpatient Hospital Stay (HOSPITAL_COMMUNITY): Payer: Medicare Other

## 2016-11-06 DIAGNOSIS — R1312 Dysphagia, oropharyngeal phase: Secondary | ICD-10-CM

## 2016-11-06 DIAGNOSIS — F05 Delirium due to known physiological condition: Secondary | ICD-10-CM

## 2016-11-06 DIAGNOSIS — Z515 Encounter for palliative care: Secondary | ICD-10-CM

## 2016-11-06 DIAGNOSIS — R0600 Dyspnea, unspecified: Secondary | ICD-10-CM

## 2016-11-06 DIAGNOSIS — R0603 Acute respiratory distress: Secondary | ICD-10-CM

## 2016-11-06 DIAGNOSIS — R410 Disorientation, unspecified: Secondary | ICD-10-CM

## 2016-11-06 LAB — GLUCOSE, CAPILLARY
GLUCOSE-CAPILLARY: 195 mg/dL — AB (ref 65–99)
GLUCOSE-CAPILLARY: 196 mg/dL — AB (ref 65–99)
GLUCOSE-CAPILLARY: 213 mg/dL — AB (ref 65–99)
Glucose-Capillary: 246 mg/dL — ABNORMAL HIGH (ref 65–99)
Glucose-Capillary: 88 mg/dL (ref 65–99)
Glucose-Capillary: 170 mg/dL — ABNORMAL HIGH (ref 65–99)

## 2016-11-06 LAB — CBC WITH DIFFERENTIAL/PLATELET
BASOS PCT: 0 %
Basophils Absolute: 0 10*3/uL (ref 0.0–0.1)
Eosinophils Absolute: 0.1 10*3/uL (ref 0.0–0.7)
Eosinophils Relative: 1 %
HEMATOCRIT: 36.8 % — AB (ref 39.0–52.0)
HEMOGLOBIN: 12.1 g/dL — AB (ref 13.0–17.0)
LYMPHS PCT: 10 %
Lymphs Abs: 0.7 10*3/uL (ref 0.7–4.0)
MCH: 33 pg (ref 26.0–34.0)
MCHC: 32.9 g/dL (ref 30.0–36.0)
MCV: 100.3 fL — AB (ref 78.0–100.0)
MONO ABS: 0.9 10*3/uL (ref 0.1–1.0)
MONOS PCT: 13 %
NEUTROS ABS: 5 10*3/uL (ref 1.7–7.7)
NEUTROS PCT: 75 %
Platelets: 82 10*3/uL — ABNORMAL LOW (ref 150–400)
RBC: 3.67 MIL/uL — ABNORMAL LOW (ref 4.22–5.81)
RDW: 14.1 % (ref 11.5–15.5)
WBC: 6.6 10*3/uL (ref 4.0–10.5)

## 2016-11-06 LAB — BASIC METABOLIC PANEL
ANION GAP: 8 (ref 5–15)
BUN: 16 mg/dL (ref 6–20)
CHLORIDE: 113 mmol/L — AB (ref 101–111)
CO2: 24 mmol/L (ref 22–32)
CREATININE: 0.78 mg/dL (ref 0.61–1.24)
Calcium: 8 mg/dL — ABNORMAL LOW (ref 8.9–10.3)
GFR calc non Af Amer: >60 mL/min (ref >60)
GLUCOSE: 220 mg/dL — AB (ref 65–99)
Potassium: 3.5 mmol/L (ref 3.5–5.1)
Sodium: 145 mmol/L (ref 135–145)

## 2016-11-06 LAB — MAGNESIUM: Magnesium: 1.7 mg/dL (ref 1.7–2.4)

## 2016-11-06 LAB — TSH: TSH: 5.945 u[IU]/mL — ABNORMAL HIGH (ref 0.350–4.500)

## 2016-11-06 LAB — AMMONIA: AMMONIA: 46 umol/L — AB (ref 9–35)

## 2016-11-06 LAB — T4, FREE: Free T4: 0.98 ng/dL (ref 0.61–1.12)

## 2016-11-06 MED ORDER — OLANZAPINE 2.5 MG PO TABS
2.5000 mg | ORAL_TABLET | Freq: Once | ORAL | Status: DC | PRN
  Filled 2016-11-06: qty 1

## 2016-11-06 MED ORDER — OXYCODONE HCL 5 MG PO TABS
10.0000 mg | ORAL_TABLET | Freq: Two times a day (BID) | ORAL | Status: DC
  Administered 2016-11-06: 10 mg
  Filled 2016-11-06: qty 2

## 2016-11-06 MED ORDER — IOPAMIDOL (ISOVUE-300) INJECTION 61%
15.0000 mL | Freq: Once | INTRAVENOUS | Status: DC | PRN
  Administered 2016-11-06: 15 mL via ORAL
  Filled 2016-11-06: qty 30

## 2016-11-06 MED ORDER — OXYCODONE HCL 5 MG PO TABS: 7.5000 mg | ORAL_TABLET | Freq: Two times a day (BID) | ORAL | Status: DC

## 2016-11-06 MED ORDER — IOPAMIDOL (ISOVUE-370) INJECTION 76%
100.0000 mL | Freq: Once | INTRAVENOUS | Status: AC | PRN
  Administered 2016-11-06: 100 mL via INTRAVENOUS

## 2016-11-06 NOTE — Progress Notes (Addendum)
Inpatient Diabetes Program Recommendations  AACE/ADA: New Consensus Statement on Inpatient Glycemic Control (2015)  Target Ranges:  Prepandial:   less than 140 mg/dL      Peak postprandial:   less than 180 mg/dL (1-2 hours)      Critically ill patients:  140 - 180 mg/dL   Lab Results  Component Value Date   GLUCAP 246 (H) 11/06/2016   HGBA1C 6.7 (H) 09/11/2014    Review of Glycemic Control  Diabetes history: DM 2 Outpatient Diabetes medications: Tradjenta 5 mg QHS Current orders for Inpatient glycemic control: Novolog Sensitive + HS scale  Inpatient Diabetes Program Recommendations:   While on Tube Feeds, please place patient on Novolog Sensitive Correction (0-9 units) Q4hours and discontinue Novolog HS scale. If glucose levels are still elevated after increase frequency in correction consider adding tube feed coverage, Novolog 2-3 units Q4hours.  Thanks,  Tama Headings RN, MSN, [facility] Inpatient Diabetes Coordinator Team Pager [phone] (8a-5p)

## 2016-11-06 NOTE — Progress Notes (Signed)
0831 11 beats of vtach with a dropped beat in the middle.  Asymptomatic.  Also no pacing spikes noted.  MD informed.

## 2016-11-06 NOTE — Progress Notes (Signed)
Nutrition Follow-up  DOCUMENTATION CODES:   Severe malnutrition in context of chronic illness, Underweight  INTERVENTION:  Continue Osmolite 1.5 @ 50 ml/hr with free water flushes of 30 ml Q4hrs. Provides 1800 kcal (100% of needs), 75 grams protein (100% of needs) and 1096 ml H2O.  RD to continue to monitor patient's status and discussions regarding goals of care.  NUTRITION DIAGNOSIS:   Malnutrition related to chronic illness as evidenced by severe depletion of body fat, severe depletion of muscle mass.  Ongoing.  GOAL:   Patient will meet greater than or equal to 90% of their needs  Met with TF.  MONITOR:   PO intake, Labs, Weight trends, TF tolerance, I & O's  REASON FOR ASSESSMENT:   Consult Enteral/tube feeding initiation and management  ASSESSMENT:   82 y.o. male with medical history significant of hypertension, hyperlipidemia, diabetes mellitus, GIB, COPD, hypothyroidism, thrombocytopenia, s/p of pacemaker placement, A. fib not on anticoagulants, hepatocellular carcinoma (S/p of right lobe resection 2012), dissecting abdominal arterial aneurysm, chronic back pain, dysphagia, s/p of PEG tube placement, who presents with fever, cough, shortness of breath.  -New consult in for Palliative Care to discuss goals of care in setting of poor quality of life.  Attempted to speak to patient at bedside. He is very lethargic and unable to answer questions. No family present at bedside. Spoke with RN. Patient requiring pain medications which make him lethargic. Poor quality of life now as he is aspirating everything. Has not had anything PO past 24 hours, just TF. Tolerating TF @ goal rate.  TF: Osmolite 1.5 @ 50 ml/hr via G-J tube + free water flush of 30 ml Q4hrs.    Weight trend: 54.9 kg on 11/3, 57.1 kg on 11/4, 59.1 kg on 11/7, 53.3 kg on 11/7. Differences in weight likely due to error in scale due to such significant changes.   Medications reviewed and include: B-complex  with vitamin C 1 tablet daily, Novolog sliding scale TID with meals and daily at bedtime, levothyroxine, Miralax, Oxycodone PRN. Was receiving NS @ 100 ml/hr earlier today.  Labs reviewed: CBG 180-246 past 24 hrs, Chloride 113, Glucose 220.   Diet Order:  Diet full liquid Room service appropriate? Yes; Fluid consistency: Thin  Skin:  Reviewed, no issues  Last BM:  11/3  Height:   Ht Readings from Last 1 Encounters:  11/02/16 _0  (1.753 m)    Weight:   Wt Readings from Last 1 Encounters:  11/06/16 117 lb 8.1 oz (53.3 kg)    Ideal Body Weight:  72.7 kg  BMI:  Body mass index is 17.35 kg/m.  Estimated Nutritional Needs:   Kcal:  1600-1800  Protein:  75-85g  Fluid:  1.6-1.8L/day  EDUCATION NEEDS:   No education needs identified at this time  Willey Blade, MS, RD, LDN Pager: [phone] After Hours Pager: [phone]

## 2016-11-06 NOTE — Progress Notes (Signed)
SLP Cancellation Note  Patient Details Name: [name] MRN: [id number] DOB: Jan 07, 1931   Cancelled treatment:       Reason Eval/Treat Not Completed: Fatigue/lethargy limiting ability to participate   Luanna Salk, [place] [facility] SLP [phone]

## 2016-11-06 NOTE — Progress Notes (Signed)
RT attempted to draw ABG x 2. MD aware and will hold ABG at this time. After CT need for ABG will be reassessed

## 2016-11-06 NOTE — Progress Notes (Signed)
OT Cancellation Note  Patient Details Name: [name] MRN: [id number] DOB: [birth year]   Cancelled Treatment:    Reason Eval/Treat Not Completed: Fatigue/lethargy limiting ability to participate RN aware. Pt sleeping with mouth open - not able to participate with OT at this time   Betsy Pries 11/06/2016, 12:50 PM

## 2016-11-06 NOTE — Progress Notes (Signed)
Consultation Note Date: 11/06/2016   Patient Name: [name]  DOB: May 22, 1931  MRN: [id number]  Age / Sex: 82 y.o., male  PCP: Conni Slipper, NP Referring Physician: Wallis Bamberg, MD  Reason for Consultation: Establishing goals of care, Non pain symptom management and Pain control  HPI/Patient Profile: 82 y.o. male  with past medical history of hypertension, hyperlipidemia, diabetes mellitus, GIB,COPD, hypothyroidism, thrombocytopenia, s/p of pacemaker placement, A. fibnot on anticoagulants, hepatocellular carcinoma (S/pof right lobe resection 2012), dissecting abdominal arterial aneurysm, chronic back pain, dysphagia, s/p of PEG tube placement admitted on 11/02/2016 with acute on chronic respiratory failure, sepsis, and decline in mental status over the last 2 days.  Palliative consulted for goals of care.   Clinical Assessment and Goals of Care: Met today with patient and his daughter, Sharyn Lull. He will open eyes and respond intermittently to simple "yes/no" questions, but drifts off and is unable to participate in conversation.  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  He worked for Adult nurse for period of time.  She reports that he has been chronically ill for a long time, and she has seen his decline over the last 4 years.  He has been seen at [facility] on multiple occasions where doctors "didn't expect him to make it."  She also states that this is the worst that she has seen him.  She understands multiple long term clinical issues, including recurrent cancer, but remains troubled by the fact that he had an acute change over the past couple of days.  She believes that he would want to continue with current treatment and plan for workup to look for reversible causes.  If he is, however, approaching the end of his life, she is adamant that  he would not want to suffer.  SUMMARY OF RECOMMENDATIONS   - Plan to continue with current care while workup completed for potentially reversible causes. - Began discussion of pathways forward in the event he continues to decline and no reversible causes identified.  His daughter seemed open to idea of focus on comfort if it appears we are approaching end of life. - F/u tomorrow once CT and labwork completed.  Code Status/Advance Care Planning:  DNR   Symptom Management:   Confusion: Workup underway.  Added one time zyprexa if needed for agitation prior to imaging.  Pain: Continue oxycodone as needed  Palliative Prophylaxis:   Aspiration, Bowel Regimen and Delirium Protocol  Psycho-social/Spiritual:   Desire for further Chaplaincy support:Did not address today  Additional Recommendations: Caregiving  Support/Resources  Prognosis:   Unable to determine  Discharge Planning: To Be Determined      Primary Diagnoses: Present on Admission: . Chronic atrial fibrillation ([place]) . Hypothyroid . Pacemaker . Thrombocytopathia ([place]) . HCAP (healthcare-associated pneumonia) . Sepsis ([place]) . Copd Golds C . Aspiration pneumonia ([place]) . Acute on chronic respiratory failure with hypoxia (HCC)   I have reviewed the medical record, interviewed the patient and family, and examined  the patient. The following aspects are pertinent.  Past Medical History:  Diagnosis Date  . Back pain   . Cancer ([place])   . COPD (chronic obstructive pulmonary disease) (HCC)    Gold C  . Coronary artery disease   . Dissecting aortic aneurysm, abdominal ([place]) 11/13/2011   Overview:  Severe aortoiliac stenosis with possible a chronic dissection versus  ulcerated plaque/thrombus extending above and below the aortic  bifurcation; without aneurysmal dilation  as per CT abdomen noted 09/25/2011   . DM2 (diabetes mellitus, type 2) ([place])    takes Tradjenta daily  . Dysphagia    s/p PEG placement  . Norris  (hepatocellular carcinoma) ([place])    s/p right lobe resection 2012  . Hyperlipidemia   . Hypertension   . Hypothyroidism    takes Synthroid daily   . Pacemaker 11/11/2015   St. Jude single chamber pacemaker implanted at [facility] 11/2014.  Marland Kitchen Paroxysmal atrial flutter (HCC)    and fibrillation with known bradycardia, Chads2vasc of 4, not on A/C due to thrombocytopenia and functional status  . Peripheral vascular disease ([place])    LICA stent [id number]  . Pneumonia 11/03/2012   aspiration pneumonias  . Pneumothorax 10/2015  . Presence of permanent cardiac pacemaker   . Severe malnutrition ([place])   . Shortness of breath dyspnea    with exertion   . Thrombocytopenia [facility])    Social History   Social History  . Marital status: Widowed    Spouse name: N/A  . Number of children: 2  . Years of education: N/A   Occupational History  .  Retired   Social History Main Topics  . Smoking status: Former Smoker    Packs/day: 1.00    Years: 55.00    Types: Cigarettes    Quit date: 12/31/2010  . Smokeless tobacco: Never Used     Comment: quit smoking about 5 yrs ago  . Alcohol use No     Comment: no alcohol in 2012, he used to drink heavyly in teh past   . Drug use: No  . Sexual activity: Not Asked   Other Topics Concern  . None   Social History Narrative   Lives with male friend who is also a Land.     Family History  Problem Relation Age of Onset  . Cancer Father     throat, died young  . Liver disease Brother   . CAD Neg Hx    Scheduled Meds: . amoxicillin-clavulanate  875 mg Oral Q12H  . aspirin  81 mg Per Tube QHS  . B-complex with vitamin C  1 tablet Oral Daily  . budesonide  0.25 mg Nebulization BID  . cyproheptadine  4 mg Per Tube QHS  . insulin aspart  0-5 Units Subcutaneous QHS  . insulin aspart  0-9 Units Subcutaneous TID WC  . ipratropium-albuterol  3 mL Nebulization TID  . levothyroxine  88 mcg Per Tube QAC breakfast  . polyethylene glycol  8.5 g Per Tube Daily    . sodium chloride  1,000 mL Intravenous Once   Continuous Infusions: . feeding supplement (OSMOLITE 1.5 CAL) 1,000 mL (11/05/16 1800)   PRN Meds:.acetaminophen, albuterol, guaiFENesin, iopamidol, OLANZapine, ondansetron, oxyCODONE Medications Prior to Admission:  Prior to Admission medications   Medication Sig Start Date End Date Taking? Authorizing Provider  albuterol (PROVENTIL) (2.5 MG/3ML) 0.083% nebulizer solution Take 3 mLs (2.5 mg total) by nebulization every 6 (six) hours as needed for wheezing or shortness of breath. 09/12/14  Yes Gagan S Lama, MD  aspirin EC 81 MG tablet Place 81 mg into feeding tube at bedtime.    Yes Historical Provider, MD  budesonide (PULMICORT) 0.25 MG/2ML nebulizer solution Take 0.25 mg by nebulization every 6 (six) hours.  05/24/15  Yes Historical Provider, MD  cyproheptadine (PERIACTIN) 4 MG tablet Place 4 mg into feeding tube at bedtime.  05/24/15  Yes Historical Provider, MD  ergocalciferol (VITAMIN D2) 50000 units capsule Take 50,000 Units by mouth once a week.   Yes Historical Provider, MD  levothyroxine (SYNTHROID, LEVOTHROID) 88 MCG tablet Take 88 mcg by mouth daily.   Yes Historical Provider, MD  linagliptin (TRADJENTA) 5 MG TABS tablet Place 5 mg into feeding tube at bedtime.    Yes Historical Provider, MD  Nutritional Supplements (FEEDING SUPPLEMENT, OSMOLITE 1.5 CAL,) LIQD Place 1,000 mLs into feeding tube continuous.   Yes Historical Provider, MD  oxyCODONE (ROXICODONE) 15 MG immediate release tablet Place 7.5-15 mg into feeding tube See admin instructions. Take 1/2 - 1 tablet by feeding tube twice daily (depending on pain level), may take an additional 1/2 -1 tablet at noon if needed for pain 09/09/15  Yes Historical Provider, MD  polyethylene glycol (MIRALAX / GLYCOLAX) packet Place 8.5 g into feeding tube daily.  11/09/15  Yes Historical Provider, MD  tiotropium (SPIRIVA) 18 MCG inhalation capsule Place 18 mcg into inhaler and inhale 2 (two) times  daily.   Yes Historical Provider, MD   No Known Allergies Review of Systems  Unable to perform ROS  Physical Exam  General: In bed with eyes closed, intermittently opens eyes and answers single word questions, but not consistent or reliable.  Confused.  HEENT: No bruits, no goiter, no JVD Heart: Regular rate and rhythm. No murmur appreciated. Lungs: Fair air movement, scattered rhonchi with anterior auscultation Abdomen: Soft, nontender, nondistended, positive bowel sounds.  Ext: No significant edema Skin: Warm and dry Neuro: Does not follow commands. No focal deficits noted, moves 4 extremities.  Vital Signs: BP (!) 168/68 (BP Location: Left Arm)   Pulse 87   Temp 98.7 F (37.1 C) (Oral)   Resp 19   Ht 5' 9" (1.753 m)   Wt 53.3 kg (117 lb 8.1 oz)   SpO2 98%   BMI 17.35 kg/m  Pain Assessment: Faces   Pain Score: Asleep   SpO2: SpO2: 98 % O2 Device:SpO2: 98 % O2 Flow Rate: .O2 Flow Rate (L/min): 3.5 L/min  IO: Intake/output summary:  Intake/Output Summary (Last 24 hours) at 11/06/16 1512 Last data filed at 11/06/16 1504  Gross per 24 hour  Intake           2517.5 ml  Output             1800 ml  Net            717.5 ml    LBM: Last BM Date: 11/02/16 Baseline Weight: Weight: 54.9 kg (121 lb) Most recent weight: Weight: 53.3 kg (117 lb 8.1 oz)     Palliative Assessment/Data:   Flowsheet Rows   Flowsheet Row Most Recent Value  Intake Tab  Referral Department  Hospitalist  Unit at Time of Referral  Oncology Unit  Palliative Care Primary Diagnosis  Cancer  Date Notified  11/06/16  Palliative Care Type  New Palliative care  Reason for referral  Pain, Non-pain Symptom, Clarify Goals of Care  Date of Admission  11/02/16  Date first seen by Palliative Care  11/06/16  # of days Palliative   referral response time  0 Day(s)  # of days IP prior to Palliative referral  4  Clinical Assessment  Palliative Performance Scale Score  40%  Pain Max last 24 hours  Not able  to report  Pain Min Last 24 hours  Not able to report  Psychosocial & Spiritual Assessment  Palliative Care Outcomes  Patient/Family meeting held?  Yes  Who was at the meeting?  daughter  Palliative Care Outcomes  Clarified goals of care      Time In: 1310 Time Out: 1410 Time Total: 60 Greater than 50%  of this time was spent counseling and coordinating care related to the above assessment and plan.  Signed by: Micheline Rough, MD   Please contact Palliative Medicine Team phone at [phone] for questions and concerns.  For individual provider: See Shea Evans

## 2016-11-06 NOTE — Progress Notes (Signed)
PROGRESS NOTE    [name]  [id number] DOB: 08-18-1931 DOA: 11/02/2016 PCP: Conni Slipper, NP    Brief Narrative:  [name] is a 82 y.o. male with medical history significant of hypertension, hyperlipidemia, diabetes mellitus, GIB, COPD, hypothyroidism, thrombocytopenia, s/p of pacemaker placement, A. fib not on anticoagulants, hepatocellular carcinoma (S/p of right lobe resection 2012), dissecting abdominal arterial aneurysm, chronic back pain, dysphagia, s/p of PEG tube placement, who presents with fever, cough, shortness of breath.  Pt states that his symptoms started today. He has fever, cough and shortness of breath. He had fever of 104.2 (Temp was taken in the ear by her daughter). He coughs up white colored mucus, denies chest pain. He has nausea, but no vomiting. Patient states that he had PEG tube replaced at [facility] today. Patient does not have abdominal pain, diarrhea, symptoms of UTI or unilateral weakness. He states that he still eating liquid food through mouth some times.  ED Course: pt was found to have WBC 6.4, lactate 1.5, temperature 102.9, any urinalysis, electrolytes renal function okay, oxygen saturation 87 on room air, which improved to 92 on 2 L oxygen. X-ray of acute abdomen/chest showed left lower lobe infiltration. pt is admitted to telemetry bed as inpatient.  Patient admitted on in the morning of 11/4 developed hypotension with BP in the 70s/20s; he remained cognitive throughout period of hypotension.  Additional bolus of fluid given and PCCM was consulted.  Blood pressure increased with fluid resuscitation.  Patient underwent MBS test in order to assess risk of aspiration.   Assessment & Plan:   Principal Problem:   Acute on chronic respiratory failure with hypoxia (HCC) Active Problems:   Thrombocytopathia ([place])   DM (diabetes mellitus) ([place])   Hypothyroid   Dysphagia, J tube placed   Copd Golds C   HCAP (healthcare-associated pneumonia)  Aspiration pneumonia ([place])   Chronic atrial fibrillation ([place])   Pacemaker   Sepsis ([place])  Acute encephalopathy - ordered CT head, Chest (PE protocol), Abd/ Pelvis - awaiting results - ammonia level slightly elevated - unclear etiology - repeat UA - ABG ordered - daughter voices she is considering transition to full comfort based approach if patient stays altered - f/u on CT results - afebrile, laboratory data from AM WNL  - ? Hospital delirium/ metabolic encephalopathy/ stroke/ PE - unable to undergo MRI secondary to pacer   Acute on chronic respiratory failure with hypoxia and mild sepsis: likely due to aspiration pneumonia versus HCAP in the setting of COPD. - still on 2L [place] - Will place on telemetry bed - IV Vancomycin and Zosyn previously - will transition to PO Augmentin liquid - cough syrup for cough  - prn Albuterol Nebs, DuoNeb, Xopenex Neb prn for SOB - S. pneumococcal antigen negative - urine legionella negative - Follow up blood culture x2- no growth x 2 days - urine culture negative, respiratory panel negative - Flu PCR negative for Flu A and Flu B - IVF: 1.5L of NS bolus in ED, followed by 100 mL per hour of NS  - d/c IVF - SLP to follow  Dysphagia - upon reviewing notes patient know to be a significant aspiration risk - patient continues to eat and drink at home despite risk - SLP consulted - barium swallow performed yesterday - patient still has significant risk for aspiration but less risk is with thin liquids - patient has desire to drink and understands risks and is willing to take risk of possible further aspiration -  on thin liquid diet  Hypomagnesemia - 1.9 today  COPD: no acute exacerbation. -DuoNeb nebulizer, when necessary albuterol -Pulmicort nebulizer  Thrombocytopathia [facility]): Platelet 96. No bleeding tendency. -Platelets of 82  DM-II: Last A1c 6.9 on 09/11/14, well controlled. Patient is taking Linagliptin at home -SSI- will need  to monitor closely   Hypothyroidism: Last TSH was 2.96 on 09/11/14 -Continue home Synthroid  Atrial Fibrillation: CHA2DS2-VASc Score is 4, needs oral anticoagulation, but patient is not on AC possibly due to history of GI bleeding. Heart rate is well controlled. -Telemetry monitor -Continue aspirin  Urinary Retention - Foley catheter placed - will keep catheter in place for now - may try voiding trial prior to discharge  protein-calorie malutrition-severe -Nutritional supplements per tube - nutrition Consultation - continue Osmolite 1.5@ 58ml/hr  Goals of Care - lengthy discussion with patient and his daughter - patient continues to state he wants to eat and drink even though he and his daughter know he aspirates - he has decided to be a DNR - patient to think about what he would like his treatment to be- states he understands risk of aspiration and would still like to try to drink thin liquids   DVT ppx: SCD Code Status: DNR Family Communication: Spoke with daughter Disposition Plan: unclear at this time; guarded, multiple comorbid conditions and per his daughter seems as though he is done Heritage manager:   PCCM  SLP  PT  Procedures:   None  Antimicrobials:   Zosyn 11/4> 11/6  Vancomycin 11/4> 11/6  Augmentin 11/6>    Subjective: This morning had a bout of ventricular tachycardia, a skipped beat and additional beats of ventricular tachycardia.  When seen on multidisciplinary rounds patient was altered- unable to answer questions but was awake.  He will respond with repeated redirection in one word answers but unclear how reliable this is.  Repeat workup ordered including pan scan, UA, blood cultures, ammonia level, ABG.  Results pending.  Objective: Vitals:   11/05/16 2036 11/06/16 0100 11/06/16 0558 11/06/16 0610  BP: (!) 139/49  (!) 146/59 132/70  Pulse: 67  77 64  Resp: 19  18 18   Temp: 99.8 F (37.7 C) 98.3 F (36.8 C) 97.5 F (36.4 C)  97.6 F (36.4 C)  TempSrc: Axillary Axillary Oral Oral  SpO2: 96%  97% 98%  Weight:   59.1 kg (130 lb 4.7 oz) 53.3 kg (117 lb 8.1 oz)  Height:        Intake/Output Summary (Last 24 hours) at 11/06/16 0837 Last data filed at 11/06/16 0600  Gross per 24 hour  Intake             3950 ml  Output              900 ml  Net             3050 ml   Filed Weights   11/03/16 0200 11/06/16 0558 11/06/16 0610  Weight: 57.1 kg (125 lb 12.8 oz) 59.1 kg (130 lb 4.7 oz) 53.3 kg (117 lb 8.1 oz)    Examination:  General exam: Appears calm and comfortable  Respiratory system: decreased air movement bilaterally, rales in the left middle and lower lung fields, rhonchi in upper lung fields bilaterally Cardiovascular system: S1 & S2 heard, IRRR. No JVD, murmurs, rubs, gallops or clicks. No pedal edema. Gastrointestinal system: Abdomen is nondistended, soft and nontender. No organomegaly or masses felt. Normal bowel sounds heard.  G-J tube in place with  no surrounding erythema or leakage noted. Central nervous system: Alert and oriented. No focal neurological deficits. Extremities: Symmetric 5 x 5 power. Skin: No rashes, lesions or ulcers Psychiatry: Judgement and insight appear normal. Mood & affect appropriate.     Data Reviewed: I have personally reviewed following labs and imaging studies  CBC:  Recent Labs Lab 11/02/16 2311 11/05/16 0354 11/06/16 0338  WBC 6.4 4.6 6.6  NEUTROABS 5.4 3.4 5.0  HGB 14.5 11.0* 12.1*  HCT 42.7 33.6* 36.8*  MCV 97.3 100.6* 100.3*  PLT 96* 76* 82*   Basic Metabolic Panel:  Recent Labs Lab 11/02/16 2311 11/03/16 0338 11/05/16 0354 11/06/16 0338  NA 138  --  142 145  K 3.9  --  3.7 3.5  CL 105  --  113* 113*  CO2 25  --  22 24  GLUCOSE 115*  --  234* 220*  BUN 24*  --  20 16  CREATININE 0.97  --  1.00 0.78  CALCIUM 9.2  --  8.0* 8.0*  MG  --  1.4* 1.9 1.7   GFR: Estimated Creatinine Clearance: 52.7 mL/min (by C-G formula based on SCr of 0.78  mg/dL). Liver Function Tests:  Recent Labs Lab 11/02/16 2311 11/05/16 0354  AST 44* 38  ALT 18 16*  ALKPHOS 126 58  BILITOT 2.2* 1.0  PROT 7.8 5.1*  ALBUMIN 4.0 2.4*   No results for input(s): LIPASE, AMYLASE in the last 168 hours. No results for input(s): AMMONIA in the last 168 hours. Coagulation Profile: No results for input(s): INR, PROTIME in the last 168 hours. Cardiac Enzymes: No results for input(s): CKTOTAL, CKMB, CKMBINDEX, TROPONINI in the last 168 hours. BNP (last 3 results) No results for input(s): PROBNP in the last 8760 hours. HbA1C: No results for input(s): HGBA1C in the last 72 hours. CBG:  Recent Labs Lab 11/05/16 1701 11/05/16 2033 11/06/16 0042 11/06/16 0355 11/06/16 0729  GLUCAP 204* 180* 195* 213* 246*   Lipid Profile: No results for input(s): CHOL, HDL, LDLCALC, TRIG, CHOLHDL, LDLDIRECT in the last 72 hours. Thyroid Function Tests: No results for input(s): TSH, T4TOTAL, FREET4, T3FREE, THYROIDAB in the last 72 hours. Anemia Panel: No results for input(s): VITAMINB12, FOLATE, FERRITIN, TIBC, IRON, RETICCTPCT in the last 72 hours. Sepsis Labs:  Recent Labs Lab 11/02/16 2308 11/03/16 0120 11/03/16 0349  PROCALCITON  --  3.14  --   LATICACIDVEN 1.3 1.3 1.0    Recent Results (from the past 240 hour(s))  Culture, blood (routine x 2)     Status: None (Preliminary result)   Collection Time: 11/02/16 11:10 PM  Result Value Ref Range Status   Specimen Description BLOOD LEFT ANTECUBITAL  Final   Special Requests BOTTLES DRAWN AEROBIC AND ANAEROBIC 5ML  Final   Culture   Final    NO GROWTH 2 DAYS Performed at [facility]    Report Status PENDING  Incomplete  Culture, blood (routine x 2)     Status: None (Preliminary result)   Collection Time: 11/02/16 11:30 PM  Result Value Ref Range Status   Specimen Description BLOOD BLOOD LEFT FOREARM  Final   Special Requests BOTTLES DRAWN AEROBIC AND ANAEROBIC 5ML EA  Final   Culture   Final      NO GROWTH 2 DAYS Performed at [facility]    Report Status PENDING  Incomplete  Urine culture     Status: None   Collection Time: 11/03/16 12:44 AM  Result Value Ref Range Status  Specimen Description URINE, RANDOM  Final   Special Requests NONE  Final   Culture NO GROWTH Performed at [facility]   Final   Report Status 11/05/2016 FINAL  Final  Respiratory Panel by PCR     Status: None   Collection Time: 11/04/16 12:09 AM  Result Value Ref Range Status   Adenovirus NOT DETECTED NOT DETECTED Final   Coronavirus 229E NOT DETECTED NOT DETECTED Final   Coronavirus HKU1 NOT DETECTED NOT DETECTED Final   Coronavirus NL63 NOT DETECTED NOT DETECTED Final   Coronavirus OC43 NOT DETECTED NOT DETECTED Final   Metapneumovirus NOT DETECTED NOT DETECTED Final   Rhinovirus / Enterovirus NOT DETECTED NOT DETECTED Final   Influenza A NOT DETECTED NOT DETECTED Final   Influenza B NOT DETECTED NOT DETECTED Final   Parainfluenza Virus 1 NOT DETECTED NOT DETECTED Final   Parainfluenza Virus 2 NOT DETECTED NOT DETECTED Final   Parainfluenza Virus 3 NOT DETECTED NOT DETECTED Final   Parainfluenza Virus 4 NOT DETECTED NOT DETECTED Final   Respiratory Syncytial Virus NOT DETECTED NOT DETECTED Final   Bordetella pertussis NOT DETECTED NOT DETECTED Final   Chlamydophila pneumoniae NOT DETECTED NOT DETECTED Final   Mycoplasma pneumoniae NOT DETECTED NOT DETECTED Final    Comment: Performed at [facility]         Radiology Studies: No results found.      Scheduled Meds: . amoxicillin-clavulanate  875 mg Oral Q12H  . aspirin  81 mg Per Tube QHS  . B-complex with vitamin C  1 tablet Oral Daily  . budesonide  0.25 mg Nebulization BID  . cyproheptadine  4 mg Per Tube QHS  . insulin aspart  0-5 Units Subcutaneous QHS  . insulin aspart  0-9 Units Subcutaneous TID WC  . ipratropium-albuterol  3 mL Nebulization TID  . levothyroxine  88 mcg Per Tube QAC breakfast  .  polyethylene glycol  8.5 g Per Tube Daily  . sodium chloride  1,000 mL Intravenous Once   Continuous Infusions: . sodium chloride 100 mL/hr at 11/06/16 0006  . feeding supplement (OSMOLITE 1.5 CAL) 1,000 mL (11/05/16 1800)     LOS: 3 days    Time spent: 35 minutes    Newman Pies, MD [facility] Pager [phone]  If 7PM-7AM, please contact night-coverage [email or web address] Password [facility] 11/06/2016, 8:37 AM

## 2016-11-07 DIAGNOSIS — F05 Delirium due to known physiological condition: Secondary | ICD-10-CM

## 2016-11-07 DIAGNOSIS — Z515 Encounter for palliative care: Secondary | ICD-10-CM

## 2016-11-07 LAB — COMPREHENSIVE METABOLIC PANEL
ALK PHOS: 92 U/L (ref 38–126)
ALT: 19 U/L (ref 17–63)
ANION GAP: 6 (ref 5–15)
AST: 42 U/L — ABNORMAL HIGH (ref 15–41)
Albumin: 2.2 g/dL — ABNORMAL LOW (ref 3.5–5.0)
BUN: 15 mg/dL (ref 6–20)
CALCIUM: 7.9 mg/dL — AB (ref 8.9–10.3)
CO2: 27 mmol/L (ref 22–32)
CREATININE: 0.77 mg/dL (ref 0.61–1.24)
Chloride: 104 mmol/L (ref 101–111)
Glucose, Bld: 254 mg/dL — ABNORMAL HIGH (ref 65–99)
Potassium: 3.6 mmol/L (ref 3.5–5.1)
Sodium: 137 mmol/L (ref 135–145)
TOTAL PROTEIN: 5.5 g/dL — AB (ref 6.5–8.1)
Total Bilirubin: 1.4 mg/dL — ABNORMAL HIGH (ref 0.3–1.2)

## 2016-11-07 LAB — GLUCOSE, CAPILLARY
GLUCOSE-CAPILLARY: 213 mg/dL — AB (ref 65–99)
GLUCOSE-CAPILLARY: 223 mg/dL — AB (ref 65–99)
GLUCOSE-CAPILLARY: 231 mg/dL — AB (ref 65–99)
GLUCOSE-CAPILLARY: 263 mg/dL — AB (ref 65–99)
Glucose-Capillary: 216 mg/dL — ABNORMAL HIGH (ref 65–99)

## 2016-11-07 MED ORDER — OXYCODONE HCL 5 MG PO TABS: 5.0000 mg | ORAL_TABLET | Freq: Four times a day (QID) | ORAL | Status: DC | PRN

## 2016-11-07 MED ORDER — ERGOCALCIFEROL 1.25 MG (50000 UT) PO CAPS
50000.0000 [IU] | ORAL_CAPSULE | ORAL | Status: DC
  Filled 2016-11-07: qty 1

## 2016-11-07 MED ORDER — FREE WATER
200.0000 mL | Freq: Three times a day (TID) | Status: DC
  Administered 2016-11-07 – 2016-11-11 (×11): 200 mL

## 2016-11-07 MED ORDER — OXYCODONE HCL 5 MG PO TABS
15.0000 mg | ORAL_TABLET | Freq: Four times a day (QID) | ORAL | Status: DC | PRN
Start: 2016-11-07 — End: 2016-11-07

## 2016-11-07 MED ORDER — OXYCODONE HCL 5 MG PO TABS
15.0000 mg | ORAL_TABLET | Freq: Four times a day (QID) | ORAL | Status: DC | PRN
  Administered 2016-11-07: 15 mg
  Administered 2016-11-08: 5 mg
  Administered 2016-11-08: 15 mg
  Filled 2016-11-07 (×3): qty 3

## 2016-11-07 MED ORDER — OLANZAPINE 2.5 MG PO TABS
2.5000 mg | ORAL_TABLET | Freq: Every day | ORAL | Status: DC
  Administered 2016-11-07 – 2016-11-09 (×3): 2.5 mg via ORAL
  Filled 2016-11-07 (×3): qty 1

## 2016-11-07 MED ORDER — VANCOMYCIN HCL IN DEXTROSE 1-5 GM/200ML-% IV SOLN
1000.0000 mg | Freq: Once | INTRAVENOUS | Status: AC
Start: 2016-11-07 — End: 2016-11-07
  Administered 2016-11-07: 1000 mg via INTRAVENOUS
  Filled 2016-11-07: qty 200

## 2016-11-07 MED ORDER — OXYCODONE HCL 5 MG PO TABS: 10.0000 mg | ORAL_TABLET | Freq: Two times a day (BID) | ORAL | Status: DC

## 2016-11-07 MED ORDER — OLANZAPINE 5 MG PO TBDP
2.5000 mg | ORAL_TABLET | Freq: Every day | ORAL | Status: DC
  Filled 2016-11-07: qty 0.5

## 2016-11-07 MED ORDER — TIOTROPIUM BROMIDE MONOHYDRATE 18 MCG IN CAPS
18.0000 ug | ORAL_CAPSULE | Freq: Every day | RESPIRATORY_TRACT | Status: DC
Start: 2016-11-07 — End: 2016-11-10
  Administered 2016-11-08: 18 ug via RESPIRATORY_TRACT
  Filled 2016-11-07: qty 5

## 2016-11-07 NOTE — Progress Notes (Signed)
SLP Cancellation Note  Patient Details Name: CORDARREL REGALBUTO MRN: [id number] DOB: [birth year]   Cancelled treatment:       Reason Eval/Treat Not Completed: Fatigue/lethargy limiting ability to participate.  Note pt with excessive pain and sleepy this pm.  Per notes, continues to desire po despite risks.  Will follow up at later time to review MBS and aspiration mitigation strategies.   Luanna Salk, [facility] SLP [phone]

## 2016-11-07 NOTE — Progress Notes (Signed)
Spoke with pt's daughter at bedside concerning [place] needs. Daughter selected to go home with [facility] HHRN/NA/PT/OT/SW and [facility] [place].
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
## 2016-11-07 NOTE — Progress Notes (Addendum)
PHARMACY - PHYSICIAN COMMUNICATION CRITICAL VALUE ALERT - BLOOD CULTURE IDENTIFICATION (BCID)  No results found for this or any previous visit.  11/7 BCx: GPC clusters x 2 (BCID result was invalid)  Name of physician (or Provider) Contacted: Tylene Fantasia  Changes to prescribed antibiotics required: vancomycin 1gm x 1 today and follow-up with attending MD 11/9  Clovis Riley 11/07/2016  8:21 PM

## 2016-11-07 NOTE — Progress Notes (Signed)
PT Cancellation Note  Patient Details Name: CLAYTIN SWAGGER MRN: [id number] DOB: 20-Aug-1931   Cancelled Treatment:    Reason Eval/Treat Not Completed: Fatigue/lethargy limiting ability to participate;Pain limiting ability to participate Pt premedicated and continues to report pain however falling asleep with PT asking questions.   Benay Pomeroy,KATHrine E 11/07/2016, 2:03 PM Carmelia Bake, PT, DPT 11/07/2016 Pager: [phone]

## 2016-11-07 NOTE — Progress Notes (Addendum)
[facility] PROGRESS NOTE    Progress Note  [name]  [id number] DOB: May 23, 1931 DOA: 11/02/2016 PCP: Conni Slipper, NP     Brief Narrative:   [name] is an 82 y.o. male past medical history significant of essential hypertension diabetes mellitus type 2, hypothyroidism status post pacemaker atrial fibrillation not on anticoagulation, hepatocellular carcinoma status post right lower lobe resection in 2012, PEG tube on enteral feedings who presents with fever cough and shortness of breath to the emergency room.  Assessment/Plan:   Acute encephalopathy: CT scan of the head showed no acute intracranial findings. Ammonia level slightly elevated, with no asterixis on physical exam Has remained afebrile. Question if this is due to hypoactive delirium. unable to undergo MRI secondary to pacer  Acute on chronic respiratory failure with hypoxia (HCC) and mild sepsis due to aspiration pneumonia: Started empirically on IV vancomycin and cefepime, now transitioned to Augmentin. Continue cough syrup and inhalers. Blood cultures have been negative till date, influenza PCR is negative. She was still at significant risk of aspiration, patient decides to drink and nondistended risk and benefits and its willing to forego on a diet. Risk and benefits were explained.  Dysphagia: After review of his chart he has had significant risk of aspiration, patient interested in drinking at home despite knowing the risk. Barium swallow showed Patient presents with mild oral and severe pharyngeal dysphagia with sensorimotor deficits Start free water per tube.  Hypomagnesemia: Repleted overly.   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  DM (diabetes mellitus) ([place]) type II: A1c 6.9 continue sliding scale insulin.  Hypothyroid Continue Synthroid.  Chronic atrial fibrillation: CHA2DS2-VASc Scoreis 4 A candidate for anti-coagulation due to history of  GI bleed. Heart rate well controlled.  Urinary retention: Foley catheter placed, will give a voiding trial.  Severe protein caloric malnutrition: X9 continue enteral feedings.  Acute confusional state/Acute hypoactive delirium due to multiple etiologies Palliative care started Zyprexa  Palliative care encounter Home with palliative  Chronic lower extremity pain: Resume home dose narcotics. DVT prophylaxis: SCD Family Communication:daughter Disposition Plan/Barrier to D/C: home in am Code Status:     Code Status Orders        Start     Ordered   11/03/16 1101  Do not attempt resuscitation (DNR)  Continuous    Question Answer Comment  In the event of cardiac or respiratory ARREST Do not call a "code blue"   In the event of cardiac or respiratory ARREST Do not perform Intubation, CPR, defibrillation or ACLS   In the event of cardiac or respiratory ARREST Use medication by any route, position, wound care, and other measures to relive pain and suffering. May use oxygen, suction and manual treatment of airway obstruction as needed for comfort.      11/03/16 1100    Code Status History    Date Active Date Inactive Code Status Order ID Comments User Context   11/03/2016 12:45 AM 11/03/2016 11:00 AM Full Code [id number]  Ivor Costa, MD ED   08/31/2016 11:50 PM 09/05/2016  9:14 PM DNR [id number]  Edwin Dada, MD Inpatient   10/10/2015  2:43 PM 10/13/2015  7:21 PM Full Code [id number]  Melton Alar, PA-C Inpatient   09/10/2014  6:31 PM 09/12/2014  5:57 PM Full Code [id number]  Janece Canterbury, MD Inpatient   10/15/2013  6:19 AM 10/19/2013  4:45 PM Full Code [id number]  Orvan Falconer, MD Inpatient  IV Access:    Peripheral IV   Procedures and diagnostic studies:   Ct Abdomen Pelvis Wo Contrast  Result Date: 11/06/2016 CLINICAL DATA:  82 year old male presenting with fever, cough and dyspnea starting 11/05/2016. Fever to 104.2. EXAM: CT  CHEST, ABDOMEN AND PELVIS WITHOUT  CONTRAST TECHNIQUE: Multidetector CT imaging of the chest, abdomen and pelvis was performed using the standard protocol without intravenous contrast. CT technologist notes that the patient was originally scheduled for a CTA of the chest, abdomen and pelvis however the IV infiltrated 57 cc of Isovue 370. Right arm was immediately elevated and ice applied to second injection. The patient's nurse and ordering clinician Dr. Jearld Shines were notified and the clinician saw the patient per report. Order was then changed to a noncontrast exam. Safety zone was filed. COMPARISON:  09/05/2016 CT abdomen, CT abdomen and pelvis from 09/02/2016 can't 11/02/2016 chest radiograph FINDINGS: CT CHEST FINDINGS Cardiovascular: The heart is mildly enlarged. There is coronary arteriosclerosis and calcifications at the aortic root. Single pacer lead is seen extending into the right ventricle. Atherosclerosis of the thoracic aorta without aneurysm. Slight dilatation of the main pulmonary artery age of 35 mm. Mediastinum/Nodes: Precarinal lymph node measuring up to 13 mm is identified short axis. 11 mm AP window lymph node short axis. No paratracheal lymphadenopathy. Lungs/Pleura: Moderate bilateral pleural effusions with compressive atelectasis. Superimposed upon centrilobular emphysema are areas of patchy airspace opacities involving the right upper lobe, right middle lobe and right lower lobe. Lesser degree of involvement seen on the left adjacent to the left heart border within the left upper lobe and lingula. Musculoskeletal: Extravasated contrast medium noted along the visualized right upper extremity to base of neck on the right. No osteolytic or osteoblastic disease within the chest. 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 No prevertebral soft tissue swelling is seen. This may represent discontinuous calcification as opposed to posttraumatic deformity. CT  ABDOMEN AND PELVIS FINDINGS Hepatobiliary: The patient is status post right hepatectomy. There is a 28 x 21 mm hypodensity in the left hepatic lobe series 2, image 75 previously estimated at 29 x 25 mm. More cephalad and medial on series 2, image 70 is a 21 x 18 mm hypodensity versus 25 x 20 mm previously. It should be noted that these are incompletely characterized and assessed due to lack of significant contrast bolus due to above-described contrast extravasation. No ductal dilatation is noted. Pancreas: Atrophic without ductal dilatation or mass. Spleen: Normal size.  No focal lesions. Adrenals/Urinary Tract: The left adrenal gland is slightly bulbous along its distal limbs. Right adrenal gland is unremarkable. There is a stable hypodensity the upper pole of the left kidney measuring approximately 15 mm and consistent with a cyst. This is stable. Urinary opacification of the renal collecting systems and bladder from what IV contrast was received prior extravasation. Foley catheter noted within the bladder. No obstructive uropathy. Stomach/Bowel: Feeding gastrojejunostomy tube with tip in the proximal jejunum seen within the left lower quadrant. No bowel obstruction. Enteric contrast reaches distal transverse colon. There appears to be segmental thickening above distal transverse and proximal descending colon with minimal pericolonic inflammation suspicious for colitis. No bowel obstruction or free air. 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 No pathologically enlarged appearing lymph nodes. Reproductive: Coarse calcifications along the periphery of the right prostate gland. Normal size prostate. Other: Small amount of free fluid  in the pelvis.  Mild anasarca. Musculoskeletal: Stable mild L2 superior endplate wedge compression. No suspicious osteoblastic or lytic disease. IMPRESSION: Development of moderate  bilateral pleural effusions with adjacent compressive atelectasis. Superimposed on centrilobular emphysema are patchy airspace opacities right greater than left suspicious pneumonia. Reactive mediastinal lymphadenopathy is also suspected. Right hepatectomy with 2 hypodense left hepatic lobe lesions that appears slightly smaller than recent comparison. It should be noted that these are suboptimally characterized due to lack of significant IV contrast bolus due to extravasation of contrast. Mild segmental thickening of distal transverse and proximal descending colon with mild pericolonic inflammation. A colitis is not entirely excluded. 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 Stable superior endplate fracture L2. 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 Electronically Signed   By: Ashley Royalty M.D.   On: 11/06/2016 21:13   Ct Head Wo Contrast  Result Date: 11/06/2016 CLINICAL DATA:  Confusion. EXAM: CT HEAD WITHOUT CONTRAST TECHNIQUE: Contiguous axial images were obtained from the base of the skull through the vertex without intravenous contrast. COMPARISON:  09/25/2012 FINDINGS: Brain: There is no evidence of acute cortical infarct, intracranial hemorrhage, mass, midline shift, or extra-axial fluid collection. Cerebral atrophy is unchanged. Periventricular white matter hypodensities are unchanged and nonspecific but compatible with minimal chronic small vessel ischemic disease. Vascular: No hyperdense vessel. Calcified atherosclerosis at the skullbase. Skull: No fracture or suspicious osseous lesion. Sinuses/Orbits: Minimal right maxillary sinus mucosal thickening, incompletely visualized. Trace mastoid effusions. Prior bilateral cataract extraction. Other: None. IMPRESSION: No evidence of acute intracranial abnormality. Electronically Signed   By: Logan Bores M.D.   On: 11/06/2016 20:39   Ct Chest Wo Contrast  Result Date: 11/06/2016 CLINICAL  DATA:  82 year old male presenting with fever, cough and dyspnea starting 11/05/2016. Fever to 104.2. EXAM: CT  CHEST, ABDOMEN AND PELVIS WITHOUT CONTRAST TECHNIQUE: Multidetector CT imaging of the chest, abdomen and pelvis was performed using the standard protocol without intravenous contrast. CT technologist notes that the patient was originally scheduled for a CTA of the chest, abdomen and pelvis however the IV infiltrated 57 cc of Isovue 370. Right arm was immediately elevated and ice applied to second injection. The patient's nurse and ordering clinician Dr. Jearld Shines were notified and the clinician saw the patient per report. Order was then changed to a noncontrast exam. Safety zone was filed. COMPARISON:  09/05/2016 CT abdomen, CT abdomen and pelvis from 09/02/2016 can't 11/02/2016 chest radiograph FINDINGS: CT CHEST FINDINGS Cardiovascular: The heart is mildly enlarged. There is coronary arteriosclerosis and calcifications at the aortic root. Single pacer lead is seen extending into the right ventricle. Atherosclerosis of the thoracic aorta without aneurysm. Slight dilatation of the main pulmonary artery age of 41 mm. Mediastinum/Nodes: Precarinal lymph node measuring up to 13 mm is identified short axis. 11 mm AP window lymph node short axis. No paratracheal lymphadenopathy. Lungs/Pleura: Moderate bilateral pleural effusions with compressive atelectasis. Superimposed upon centrilobular emphysema are areas of patchy airspace opacities involving the right upper lobe, right middle lobe and right lower lobe. Lesser degree of involvement seen on the left adjacent to the left heart border within the left upper lobe and lingula. Musculoskeletal: Extravasated contrast medium noted along the visualized right upper extremity to base of neck on the right. No osteolytic or osteoblastic disease within the chest. 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 No prevertebral soft tissue swelling is seen. This may represent discontinuous  calcification as opposed to posttraumatic deformity. CT ABDOMEN AND PELVIS FINDINGS Hepatobiliary: The patient is status post right hepatectomy. There is a 28 x 21 mm hypodensity in the left hepatic lobe series 2, image 75 previously estimated at 29 x 25 mm. More cephalad and medial on series 2, image 70 is a 21 x 18 mm hypodensity versus 25 x 20 mm previously. It should be noted that these are incompletely characterized and assessed due to lack of significant contrast bolus due to above-described contrast extravasation. No ductal dilatation is noted. Pancreas: Atrophic without ductal dilatation or mass. Spleen: Normal size.  No focal lesions. Adrenals/Urinary Tract: The left adrenal gland is slightly bulbous along its distal limbs. Right adrenal gland is unremarkable. There is a stable hypodensity the upper pole of the left kidney measuring approximately 15 mm and consistent with a cyst. This is stable. Urinary opacification of the renal collecting systems and bladder from what IV contrast was received prior extravasation. Foley catheter noted within the bladder. No obstructive uropathy. Stomach/Bowel: Feeding gastrojejunostomy tube with tip in the proximal jejunum seen within the left lower quadrant. No bowel obstruction. Enteric contrast reaches distal transverse colon. There appears to be segmental thickening above distal transverse and proximal descending colon with minimal pericolonic inflammation suspicious for colitis. No bowel obstruction or free air. 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 No pathologically enlarged appearing lymph nodes. Reproductive: Coarse calcifications along the periphery of the right prostate gland. Normal size prostate. Other: Small amount of free fluid in the pelvis.  Mild anasarca.  Musculoskeletal: Stable mild L2 superior endplate wedge compression. No suspicious osteoblastic or lytic disease. IMPRESSION: Development of moderate bilateral pleural effusions with adjacent compressive atelectasis. Superimposed on centrilobular emphysema are patchy airspace opacities right greater than left suspicious pneumonia. Reactive mediastinal lymphadenopathy is also suspected. Right hepatectomy with 2 hypodense left hepatic lobe lesions that appears slightly smaller than recent comparison. It should be noted that these are suboptimally characterized due to lack of significant IV contrast bolus due to extravasation of contrast. Mild segmental thickening of distal transverse and proximal descending colon with mild pericolonic inflammation. A colitis is not entirely excluded. 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 Stable superior endplate fracture L2. 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 Electronically Signed   By: Ashley Royalty M.D.   On: 11/06/2016 21:13     Medical Consultants:    None.  Anti-Infectives:   Augmentin  Subjective:    Florina Ou he relates of the lower extremity pain.  Objective:    Vitals:   11/07/16 0600 11/07/16 0645 11/07/16 0856 11/07/16 0904  BP: (!) 148/51     Pulse: 61     Resp: (!) 24     Temp: 98.6 F (37 C)     TempSrc: Oral     SpO2: 90%  98% 98%  Weight:  54.7 kg (120 lb 9.5 oz)    Height:        Intake/Output Summary (Last 24 hours) at 11/07/16 1223 Last data filed at 11/07/16 1050  Gross per 24 hour  Intake              690 ml  Output             2950 ml  Net            -2260 ml   Filed Weights   11/06/16  [id number] 11/06/16 0610 11/07/16 0645  Weight: 59.1 kg (130 lb 4.7 oz) 53.3 kg (117 lb 8.1 oz) 54.7 kg (120 lb 9.5 oz)    Exam: General exam: In no acute distress. Respiratory system: Good air movement and clear to auscultation. Cardiovascular system: S1 & S2  heard, RRR.  Gastrointestinal system: Abdomen is nondistended, soft and nontender.  Central nervous system: Alert and oriented. No focal neurological deficits. Extremities: No pedal edema. Skin: No rashes, lesions or ulcers Psychiatry: Judgement and insight appear normal. Mood & affect appropriate.    Data Reviewed:    Labs: Basic Metabolic Panel:  Recent Labs Lab 11/02/16 2311 11/03/16 0338 11/05/16 0354 11/06/16 0338 11/07/16 0402  NA 138  --  142 145 137  K 3.9  --  3.7 3.5 3.6  CL 105  --  113* 113* 104  CO2 25  --  22 24 27   GLUCOSE 115*  --  234* 220* 254*  BUN 24*  --  20 16 15   CREATININE 0.97  --  1.00 0.78 0.77  CALCIUM 9.2  --  8.0* 8.0* 7.9*  MG  --  1.4* 1.9 1.7  --    GFR Estimated Creatinine Clearance: 54.1 mL/min (by C-G formula based on SCr of 0.77 mg/dL). Liver Function Tests:  Recent Labs Lab 11/02/16 2311 11/05/16 0354 11/07/16 0402  AST 44* 38 42*  ALT 18 16* 19  ALKPHOS 126 58 92  BILITOT 2.2* 1.0 1.4*  PROT 7.8 5.1* 5.5*  ALBUMIN 4.0 2.4* 2.2*   No results for input(s): LIPASE, AMYLASE in the last 168 hours.  Recent Labs Lab 11/06/16 1310  AMMONIA 46*   Coagulation profile No results for input(s): INR, PROTIME in the last 168 hours.  CBC:  Recent Labs Lab 11/02/16 2311 11/05/16 0354 11/06/16 0338  WBC 6.4 4.6 6.6  NEUTROABS 5.4 3.4 5.0  HGB 14.5 11.0* 12.1*  HCT 42.7 33.6* 36.8*  MCV 97.3 100.6* 100.3*  PLT 96* 76* 82*   Cardiac Enzymes: No results for input(s): CKTOTAL, CKMB, CKMBINDEX, TROPONINI in the last 168 hours. BNP (last 3 results) No results for input(s): PROBNP in the last 8760 hours. CBG:  Recent Labs Lab 11/06/16 1141 11/06/16 1706 11/06/16 2038 11/07/16 0003 11/07/16 0559  GLUCAP 196* 88 170* 213* 263*   D-Dimer: No results for input(s): DDIMER in the last 72 hours. Hgb A1c: No results for input(s): HGBA1C in the last 72 hours. Lipid Profile: No results for input(s): CHOL, HDL, LDLCALC,  TRIG, CHOLHDL, LDLDIRECT in the last 72 hours. Thyroid function studies:  Recent Labs  11/06/16 1335  TSH 5.945*   Anemia work up: No results for input(s): VITAMINB12, FOLATE, FERRITIN, TIBC, IRON, RETICCTPCT in the last 72 hours. Sepsis Labs:  Recent Labs Lab 11/02/16 2308 11/02/16 2311 11/03/16 0120 11/03/16 0349 11/05/16 0354 11/06/16 0338  PROCALCITON  --   --  3.14  --   --   --   WBC  --  6.4  --   --  4.6 6.6  LATICACIDVEN 1.3  --  1.3 1.0  --   --    Microbiology Recent Results (from the past 240 hour(s))  Culture, blood (routine x 2)     Status: None (Preliminary result)   Collection Time: 11/02/16 11:10 PM  Result Value Ref Range Status   Specimen Description BLOOD LEFT ANTECUBITAL  Final   Special Requests BOTTLES DRAWN AEROBIC AND ANAEROBIC 5ML  Final   Culture   Final    NO  GROWTH 4 DAYS Performed at [facility]    Report Status PENDING  Incomplete  Culture, blood (routine x 2)     Status: None (Preliminary result)   Collection Time: 11/02/16 11:30 PM  Result Value Ref Range Status   Specimen Description BLOOD BLOOD LEFT FOREARM  Final   Special Requests BOTTLES DRAWN AEROBIC AND ANAEROBIC 5ML EA  Final   Culture   Final    NO GROWTH 4 DAYS Performed at [facility]    Report Status PENDING  Incomplete  Urine culture     Status: None   Collection Time: 11/03/16 12:44 AM  Result Value Ref Range Status   Specimen Description URINE, RANDOM  Final   Special Requests NONE  Final   Culture NO GROWTH Performed at [facility]   Final   Report Status 11/05/2016 FINAL  Final  Respiratory Panel by PCR     Status: None   Collection Time: 11/04/16 12:09 AM  Result Value Ref Range Status   Adenovirus NOT DETECTED NOT DETECTED Final   Coronavirus 229E NOT DETECTED NOT DETECTED Final   Coronavirus HKU1 NOT DETECTED NOT DETECTED Final   Coronavirus NL63 NOT DETECTED NOT DETECTED Final   Coronavirus OC43 NOT DETECTED NOT DETECTED Final     Metapneumovirus NOT DETECTED NOT DETECTED Final   Rhinovirus / Enterovirus NOT DETECTED NOT DETECTED Final   Influenza A NOT DETECTED NOT DETECTED Final   Influenza B NOT DETECTED NOT DETECTED Final   Parainfluenza Virus 1 NOT DETECTED NOT DETECTED Final   Parainfluenza Virus 2 NOT DETECTED NOT DETECTED Final   Parainfluenza Virus 3 NOT DETECTED NOT DETECTED Final   Parainfluenza Virus 4 NOT DETECTED NOT DETECTED Final   Respiratory Syncytial Virus NOT DETECTED NOT DETECTED Final   Bordetella pertussis NOT DETECTED NOT DETECTED Final   Chlamydophila pneumoniae NOT DETECTED NOT DETECTED Final   Mycoplasma pneumoniae NOT DETECTED NOT DETECTED Final    Comment: Performed at [facility]  Culture, blood (Routine X 2) w Reflex to ID Panel     Status: None (Preliminary result)   Collection Time: 11/06/16  5:15 PM  Result Value Ref Range Status   Specimen Description BLOOD LEFT HAND  Final   Special Requests IN PEDIATRIC BOTTLE .5CC  Final   Culture   Final    NO GROWTH < 24 HOURS Performed at [facility]    Report Status PENDING  Incomplete  Culture, blood (Routine X 2) w Reflex to ID Panel     Status: None (Preliminary result)   Collection Time: 11/06/16  5:20 PM  Result Value Ref Range Status   Specimen Description BLOOD LEFT HAND  Final   Special Requests IN PEDIATRIC BOTTLE .5CC  Final   Culture   Final    NO GROWTH < 24 HOURS Performed at [facility]    Report Status PENDING  Incomplete     Medications:   . amoxicillin-clavulanate  875 mg Oral Q12H  . aspirin  81 mg Per Tube QHS  . B-complex with vitamin C  1 tablet Oral Daily  . budesonide  0.25 mg Nebulization BID  . cyproheptadine  4 mg Per Tube QHS  . insulin aspart  0-5 Units Subcutaneous QHS  . insulin aspart  0-9 Units Subcutaneous TID WC  . ipratropium-albuterol  3 mL Nebulization TID  . levothyroxine  88 mcg Per Tube QAC breakfast  . OLANZapine zydis  2.5 mg Oral QHS  . oxyCODONE  10  mg Per Tube BID  . polyethylene glycol  8.5 g Per Tube Daily  . sodium chloride  1,000 mL Intravenous Once   Continuous Infusions: . feeding supplement (OSMOLITE 1.5 CAL) 1,000 mL (11/05/16 1800)    Time spent: 25 min   LOS: 4 days   Charlynne Cousins  [facility] Pager [phone]  *Please refer to [email or web address], password [email or web address] to get updated schedule on who will round on this patient, as hospitalists switch teams weekly. If 7PM-7AM, please contact night-coverage at [email or web address], password [email or web address] for any overnight needs.  11/07/2016, 12:23 PM

## 2016-11-07 NOTE — Care Management Important Message (Signed)
Important Message  Patient Details  Name: TOLLIVER ALESSANDRO MRN: [id number] Date of Birth: [birth year]   Medicare Important Message Given:  Yes    Camillo Flaming 11/07/2016, 10:12 AMImportant Message  Patient Details  Name: DEQUANDRE NORLAND MRN: [id number] Date of Birth: August 13, 1931   Medicare Important Message Given:  Yes    Camillo Flaming 11/07/2016, 10:12 AM

## 2016-11-07 NOTE — Evaluation (Signed)
Occupational Therapy Evaluation Patient Details Name: [name] MRN: [id number] DOB: 17-Apr-1931 Today's Date: 11/07/2016    History of Present Illness 82 y.o. male with medical history significant of hypertension, hyperlipidemia, diabetes mellitus, GIB, COPD, hypothyroidism, thrombocytopenia, s/p of pacemaker placement, A. fib not on anticoagulants, hepatocellular carcinoma (S/p of right lobe resection 2012), dissecting abdominal arterial aneurysm, chronic back pain, dysphagia, s/p of PEG tube placement and admitted for acute on chronic respiratory failure with hypoxia   Clinical Impression   Pt admitted with CRF. Pt currently with functional limitations due to the deficits listed below (see OT Problem List).  Pt will benefit from skilled OT to increase their safety and independence with ADL and functional mobility for ADL to facilitate discharge to venue listed below.      Follow Up Recommendations  Home health OT;SNF;Supervision/Assistance - 24 hour          Precautions / Restrictions Precautions Precautions: Fall Precaution Comments: PEG      Mobility Bed Mobility Overal bed mobility: Needs Assistance             General bed mobility comments: unable to complete due to pain Pt able to tolerate getting legs to EOB then would grimace and say please stop mam.  OT returned pt back to supine  Transfers                 General transfer comment: unable this session         ADL Overall ADL's : Needs assistance/impaired Eating/Feeding: Minimal assistance;Bed level   Grooming: Minimal assistance;Bed level                                 General ADL Comments: very limited eval due to pain.  RN came in and aware. Pt constantly pulling on foley and grimacing with bed mobility- unsure if pain is in hips or penis- pt says all over               Pertinent Vitals/Pain Pain Score: 8  Pain Location: penis and hip area with bed mobility Pain  Intervention(s): Monitored during session;Repositioned;Limited activity within patient's tolerance;Patient requesting pain meds-RN notified     Hand Dominance     Extremity/Trunk Assessment Upper Extremity Assessment Upper Extremity Assessment: Generalized weakness           Communication Communication Communication: HOH   Cognition Arousal/Alertness: Awake/alert Behavior During Therapy: WFL for tasks assessed/performed Overall Cognitive Status: Within Functional Limits for tasks assessed                     General Comments   Pt will likely be total care at home at this time based on todays evaluation            Home Living Family/patient expects to be discharged to:: Private residence Living Arrangements: Other (Comment) (daughter states pt lives with "another man") Available Help at Discharge: Family Type of Home: House Home Access: Ramped entrance;Stairs to enter Technical brewer of Steps: 3   Home Layout: One level     Bathroom Shower/Tub: Tub/shower unit Shower/tub characteristics: Door Biochemist, clinical: Standard     Home Equipment: Environmental consultant - 2 wheels          Prior Functioning/Environment Level of Independence: Independent with assistive device(s)                 OT Problem List: Decreased strength;Decreased activity tolerance;Decreased  safety awareness;Pain   OT Treatment/Interventions: Self-care/ADL training;Patient/family education    OT Goals(Current goals can be found in the care plan section) Acute Rehab OT Goals Patient Stated Goal: did not state OT Goal Formulation: With patient Time For Goal Achievement: 11/21/16  OT Frequency: Min 2X/week           End of Session Nurse Communication: Mobility status  Activity Tolerance: Patient limited by pain Patient left: in bed;with call bell/phone within reach;with bed alarm set   Time: [id number] OT Time Calculation (min): 19 min Charges:  OT General Charges $OT Visit: 1  Procedure OT Evaluation $OT Eval Moderate Complexity: 1 Procedure G-Codes:    [name] 2016/11/12, 11:55 AM

## 2016-11-08 DIAGNOSIS — R7881 Bacteremia: Secondary | ICD-10-CM

## 2016-11-08 DIAGNOSIS — R1013 Epigastric pain: Secondary | ICD-10-CM

## 2016-11-08 LAB — CULTURE, BLOOD (ROUTINE X 2)
CULTURE: NO GROWTH
Culture: NO GROWTH

## 2016-11-08 LAB — GLUCOSE, CAPILLARY
GLUCOSE-CAPILLARY: 111 mg/dL — AB (ref 65–99)
GLUCOSE-CAPILLARY: 120 mg/dL — AB (ref 65–99)
GLUCOSE-CAPILLARY: 92 mg/dL (ref 65–99)
Glucose-Capillary: 111 mg/dL — ABNORMAL HIGH (ref 65–99)

## 2016-11-08 MED ORDER — SENNOSIDES 8.8 MG/5ML PO SYRP
10.0000 mL | ORAL_SOLUTION | Freq: Two times a day (BID) | ORAL | Status: DC
  Administered 2016-11-08 – 2016-11-10 (×5): 10 mL
  Filled 2016-11-08 (×5): qty 10

## 2016-11-08 MED ORDER — OSMOLITE 1.5 CAL PO LIQD
1000.0000 mL | ORAL | Status: DC
  Administered 2016-11-08 – 2016-11-09 (×2): 1000 mL
  Filled 2016-11-08 (×3): qty 1000

## 2016-11-08 MED ORDER — PANCRELIPASE (LIP-PROT-AMYL) 12000-38000 UNITS PO CPEP
24000.0000 [IU] | ORAL_CAPSULE | Freq: Once | ORAL | Status: AC
  Administered 2016-11-08: 24000 [IU] via ORAL
  Filled 2016-11-08: qty 2

## 2016-11-08 MED ORDER — SODIUM BICARBONATE 650 MG PO TABS
650.0000 mg | ORAL_TABLET | Freq: Once | ORAL | Status: AC
  Administered 2016-11-08: 650 mg via ORAL
  Filled 2016-11-08: qty 1

## 2016-11-08 MED ORDER — LIP MEDEX EX OINT
TOPICAL_OINTMENT | CUTANEOUS | Status: AC
  Filled 2016-11-08: qty 7

## 2016-11-08 MED ORDER — OXYCODONE HCL 5 MG PO TABS
5.0000 mg | ORAL_TABLET | ORAL | Status: DC | PRN
  Administered 2016-11-08 – 2016-11-09 (×2): 5 mg
  Administered 2016-11-09 – 2016-11-10 (×4): 10 mg
  Filled 2016-11-08 (×2): qty 2
  Filled 2016-11-08: qty 1
  Filled 2016-11-08 (×2): qty 2
  Filled 2016-11-08: qty 1
  Filled 2016-11-08: qty 2

## 2016-11-08 NOTE — Progress Notes (Signed)
Occupational Therapy Treatment Patient Details Name: [name] MRN: [id number] DOB: 1931/04/05 Today's Date: 11/08/2016    History of present illness 82 y.o. male with medical history significant of hypertension, hyperlipidemia, diabetes mellitus, GIB, COPD, hypothyroidism, thrombocytopenia, s/p of pacemaker placement, A. fib not on anticoagulants, hepatocellular carcinoma (S/p of right lobe resection 2012), dissecting abdominal arterial aneurysm, chronic back pain, dysphagia, s/p of PEG tube placement and admitted for acute on chronic respiratory failure with hypoxia   OT comments  Pt with much more participation this day!  Follow Up Recommendations  Home health OT;SNF;Supervision/Assistance - 24 hour    Equipment Recommendations  3 in 1 bedside comode    Recommendations for Other Services      Precautions / Restrictions Precautions Precautions: Fall Precaution Comments: PEG Restrictions Weight Bearing Restrictions: No       Mobility Bed Mobility Overal bed mobility: Needs Assistance Bed Mobility: Supine to Sit     Supine to sit: HOB elevated;Mod assist        Transfers Overall transfer level: Needs assistance Equipment used: Rolling walker (2 wheeled) Transfers: Sit to/from [facility] Sit to Stand: +2 safety/equipment;Max assist;+2 physical assistance;Mod assist Stand pivot transfers: Max assist;+2 physical assistance;+2 safety/equipment;Mod assist                ADL       Grooming: Minimal assistance;Sitting                   Toilet Transfer: Maximal assistance;+2 for physical assistance;RW;Cueing for sequencing;Cueing for safety;+2 for safety/equipment Toilet Transfer Details (indicate cue type and reason): sit to stand with urinal Toileting- Clothing Manipulation and Hygiene: Maximal assistance;+2 for safety/equipment;+2 for physical assistance;Sit to/from stand         General ADL Comments: pt did much more with OT  this day. Pt agreed to stand with urinal  and get to chair!                Cognition   Behavior During Therapy: WFL for tasks assessed/performed Overall Cognitive Status: Within Functional Limits for tasks assessed                               General Comments      Pertinent Vitals/ Pain       Pain Location: all over Pain Descriptors / Indicators: Sore Pain Intervention(s): Monitored during session;Premedicated before session;Limited activity within patient's tolerance            Progress Toward Goals  OT Goals(current goals can now be found in the care plan section)  Progress towards OT goals: Progressing toward goals     Plan Discharge plan remains appropriate    Co-evaluation                 End of Session Equipment Utilized During Treatment: Rolling walker   Activity Tolerance Patient tolerated treatment well   Patient Left in chair;with call bell/phone within reach;with chair alarm set;with nursing/sitter in room;with family/visitor present   Nurse Communication Mobility status        Time: 0936-1010 OT Time Calculation (min): 34 min  Charges: OT General Charges $OT Visit: 1 Procedure OT Treatments $Self Care/Home Management : 23-37 mins  [name] D 11/08/2016, 11:15 AM

## 2016-11-08 NOTE — Progress Notes (Signed)
Physical Therapy Treatment Patient Details Name: [name] MRN: [id number] DOB: 1931-10-19 Today's Date: 11/08/2016    History of Present Illness 82 y.o. male with medical history significant of hypertension, hyperlipidemia, diabetes mellitus, GIB, COPD, hypothyroidism, thrombocytopenia, s/p of pacemaker placement, A. fib not on anticoagulants, hepatocellular carcinoma (S/p of right lobe resection 2012), dissecting abdominal arterial aneurysm, chronic back pain, dysphagia, s/p of PEG tube placement and admitted for acute on chronic respiratory failure with hypoxia    PT Comments    Pt in bed difficult to follow commands.  Required repeat, functional VC's to gat OOB to [facility]. Significant mobility decline since pt amb 200 feet Nov 4th with LPT Pt required + 2 assist to transfer to [facility].  Very rigid with poor cognition.  Assisted back to bed unable to attempt amb.    Follow Up Recommendations  SNF     Equipment Recommendations  None recommended by PT    Recommendations for Other Services       Precautions / Restrictions Precautions Precautions: Fall Precaution Comments: PEG, Alzheimers Restrictions Weight Bearing Restrictions: No    Mobility  Bed Mobility Overal bed mobility: Needs Assistance Bed Mobility: Supine to Sit;Sit to Supine     Supine to sit: Max assist;Total assist;+2 for physical assistance Sit to supine: Max assist;Total assist;+2 for physical assistance   General bed mobility comments: very rigid and resistant/fearful.  allowed increased time to process and repeat functional VC's however pt demonstarated poor initiation and self ability.  + 2 assist to transfer to EOB.  Once upright, pt was able to static sit at Supervision level.    Transfers Overall transfer level: Needs assistance Equipment used: None Transfers: Sit to/from [facility] Sit to Stand: +2 safety/equipment;+2 physical assistance;Total assist Stand pivot transfers: Total  assist;+2 physical assistance;+2 safety/equipment       General transfer comment: positioned BSC 90 degrees angle from foot of bed and repeat functional VC's "lets stand up", "come sit here".  Pt demonstarted brief acknowledgement and poor initiation.  tight grasp to bed rail and apperaed confused and afraid.  assisted + 2 to [facility] with pt 5% effort.  Very rigid with flex hips and knees.    Ambulation/Gait Ambulation/Gait assistance: Mod assist;+2 physical assistance;+2 safety/equipment Ambulation Distance (Feet): 5 Feet   Gait Pattern/deviations: Step-to pattern;Shuffle     General Gait Details: unable to attempt due to poor transfer ability.     Stairs            Wheelchair Mobility    Modified Rankin (Stroke Patients Only)       Balance Overall balance assessment: History of Falls;Needs assistance Sitting-balance support: Feet supported;Bilateral upper extremity supported Sitting balance-Leahy Scale: Fair     Standing balance support: During functional activity;Bilateral upper extremity supported Standing balance-Leahy Scale: Poor                      Cognition Arousal/Alertness: Awake/alert Behavior During Therapy: Flat affect Overall Cognitive Status: Difficult to assess                      Exercises      General Comments        Pertinent Vitals/Pain Pain Assessment: Faces Faces Pain Scale: Hurts a little bit Pain Location: all over Pain Intervention(s): Monitored during session    Home Living                      Prior  Function            PT Goals (current goals can now be found in the care plan section) Progress towards PT goals: Progressing toward goals    Frequency    Min 3X/week      PT Plan Discharge plan needs to be updated    Co-evaluation             End of Session Equipment Utilized During Treatment: Oxygen Activity Tolerance: Other (comment) Patient left: in bed;with call bell/phone within  reach     Time: 1435-1505 PT Time Calculation (min) (ACUTE ONLY): 30 min  Charges:  $Gait Training: 8-22 mins $Therapeutic Activity: 23-37 mins                    G Codes:      Rica Koyanagi  PTA [facility]  Acute  Rehab Pager      [phone]

## 2016-11-08 NOTE — Progress Notes (Signed)
Bladder scan yielded 392 ml. PCP was notified.

## 2016-11-08 NOTE — Progress Notes (Signed)
Nutrition Follow-up  DOCUMENTATION CODES:   Severe malnutrition in context of chronic illness, Underweight  INTERVENTION:  Patient would like to resume nocturnal TF today. Recommend Osmolite 1.5 at 100 ml/hr over 12 hours (1900-0700) as patient was tolerating at home. Provides 1800 kcal, 75 grams protein, 912 ml H2O.  Can hold continuous TF until time to start nocturnal TF tonight.  Continue free water flush of 200 ml Q8hrs. Total H2O between free water and TF is 1512 ml daily.   NUTRITION DIAGNOSIS:   Malnutrition related to chronic illness as evidenced by severe depletion of body fat, severe depletion of muscle mass.  Ongoing.  GOAL:   Patient will meet greater than or equal to 90% of their needs  Met with TF regimen.   MONITOR:   PO intake, Labs, Weight trends, TF tolerance, I & O's  REASON FOR ASSESSMENT:   Consult Enteral/tube feeding initiation and management  ASSESSMENT:   82 y.o. male with medical history significant of hypertension, hyperlipidemia, diabetes mellitus, GIB, COPD, hypothyroidism, thrombocytopenia, s/p of pacemaker placement, A. fib not on anticoagulants, hepatocellular carcinoma (S/p of right lobe resection 2012), dissecting abdominal arterial aneurysm, chronic back pain, dysphagia, s/p of PEG tube placement, who presents with fever, cough, shortness of breath.  -Per Palliative Care note 11/8, plan will be for patient to d/c home with home health and palliative care. However, it appears patient will remain full scope treatment.  Spoke with patient and daughter at bedside. Report patient is tolerating TF regimen well. Denies N/V or abdominal pain. Requesting to return to nocturnal feeds at this time.  Medications reviewed and include: B-complex with vitamin C 1 tablet daily, Vitamin D 50000 units weekly, 200 ml free water flush Q8hrs (600 ml), Novolog sliding scale TID with meals and daily at bedtime, levothyroxine, Miralax.  Labs reviewed: CBG  111-231 past 24 hrs.  Discussed with RN. Tube feed currently on hold after giving medications for concern for intolerance. Expected to only be on hold 20-30 minutes.   Diet Order:  Diet full liquid Room service appropriate? Yes; Fluid consistency: Thin  Skin:  Reviewed, no issues  Last BM:  11/3  Height:   Ht Readings from Last 1 Encounters:  11/02/16 _0  (1.753 m)    Weight:   Wt Readings from Last 1 Encounters:  11/08/16 120 lb 2.4 oz (54.5 kg)    Ideal Body Weight:  72.7 kg  BMI:  Body mass index is 17.74 kg/m.  Estimated Nutritional Needs:   Kcal:  1600-1800  Protein:  75-85g  Fluid:  1.6-1.8L/day  EDUCATION NEEDS:   No education needs identified at this time  Willey Blade, MS, RD, LDN Pager: [phone] After Hours Pager: [phone]

## 2016-11-08 NOTE — Progress Notes (Signed)
RNCM assisting with d/c planning. PT has recommended SNF at d/c. CSW has confirmed with RNCM that pt / family are requesting d/c home and are not interested in SNF.  CSW assistance is not required at this time.  Werner Lean LCSW [phone]

## 2016-11-08 NOTE — Progress Notes (Signed)
CM spoke with pt and daughter to confirm plan is for HHRN/PT/OT/AideSW with [facility] and [facility] for palliative support.  Previous CM has arranged (see previous CM [place] note); this CM has requested [place] orders and face to face.  Pt's daughter states pt has all DME needed at home.  No other CM needs were communicated.

## 2016-11-08 NOTE — Progress Notes (Signed)
Physical Therapy Treatment Patient Details Name: [name] MRN: [id number] DOB: 1931/03/10 Today's Date: 11/08/2016    History of Present Illness 82 y.o. male with medical history significant of hypertension, hyperlipidemia, diabetes mellitus, GIB, COPD, hypothyroidism, thrombocytopenia, s/p of pacemaker placement, A. fib not on anticoagulants, hepatocellular carcinoma (S/p of right lobe resection 2012), dissecting abdominal arterial aneurysm, chronic back pain, dysphagia, s/p of PEG tube placement and admitted for acute on chronic respiratory failure with hypoxia    PT Comments    The patient is more alert today. The patient demonstrates a significant decline since PT ambulated x 200' with the patient. Recommend SNF at this time.   Follow Up Recommendations  SNF;Supervision/Assistance - 24 hour     Equipment Recommendations  None recommended by PT    Recommendations for Other Services       Precautions / Restrictions Precautions Precautions: Fall Precaution Comments: PEG    Mobility  Bed Mobility         General bed mobility comments: sitting on bed edge  Transfers Overall transfer level: Needs assistance Equipment used: Rolling walker (2 wheeled) Transfers: Sit to/from Stand Sit to Stand: +2 safety/equipment;Max assist;+2 physical assistance;Mod assist Stand pivot transfers: Max assist;+2 physical assistance;+2 safety/equipment;Mod assist       General transfer comment: stood with knees bent initially, gradually improved in posture,  required tactile facilitation to advance the legs.   Ambulation/Gait Ambulation/Gait assistance: Mod assist;+2 physical assistance;+2 safety/equipment Ambulation Distance (Feet): 5 Feet   Gait Pattern/deviations: Step-to pattern;Shuffle     General Gait Details: assistance to advance the legs,    Stairs            Wheelchair Mobility    Modified Rankin (Stroke Patients Only)       Balance Overall balance  assessment: History of Falls;Needs assistance Sitting-balance support: Feet supported;Bilateral upper extremity supported Sitting balance-Leahy Scale: Fair     Standing balance support: During functional activity;Bilateral upper extremity supported Standing balance-Leahy Scale: Poor                      Cognition Arousal/Alertness: Awake/alert Behavior During Therapy: WFL for tasks assessed/performed Overall Cognitive Status: Within Functional Limits for tasks assessed                      Exercises      General Comments        Pertinent Vitals/Pain Faces Pain Scale: Hurts even more Pain Location: all over.  Pain Descriptors / Indicators: Sore Pain Intervention(s): Monitored during session    Home Living                      Prior Function            PT Goals (current goals can now be found in the care plan section) Progress towards PT goals: Progressing toward goals    Frequency    Min 3X/week      PT Plan Discharge plan needs to be updated    Co-evaluation             End of Session Equipment Utilized During Treatment: Oxygen Activity Tolerance: Patient limited by fatigue Patient left: in chair;with call bell/phone within reach;with chair alarm set;with family/visitor present;with nursing/sitter in room     Time: 0955-1009 PT Time Calculation (min) (ACUTE ONLY): 14 min  Charges:  $Gait Training: 8-22 mins  G Codes:      Claretha Cooper 11/08/2016, 1:15 PM Tresa Endo PT [phone]

## 2016-11-08 NOTE — Progress Notes (Signed)
Patient's daughter Holland [facility] @ [phone] was notified that patient is being transferred to room 1603. Ms Venia Minks was contented to learn that her father is still doing alright.
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
## 2016-11-08 NOTE — Progress Notes (Signed)
[facility] PROGRESS NOTE    Progress Note  [name]  [id number] DOB: Jan 06, 1931 DOA: 11/02/2016 PCP: Conni Slipper, NP    Brief Narrative:   [name] is an 82 y.o. male past medical history significant of essential hypertension diabetes mellitus type 2, hypothyroidism status post pacemaker atrial fibrillation not on anticoagulation, hepatocellular carcinoma status post right lower lobe resection in 2012, PEG tube on enteral feedings who presents with fever cough and shortness of breath to the emergency room.  Assessment/Plan:   Acute encephalopathy: Likely due to Infectious etiology, they did resolve then became confused again question of it was due to question if this is due to hypoactive delirium.  Acute on chronic respiratory failure with hypoxia (HCC) and mild sepsis due to aspiration pneumonia: Continue oral Augmentin. Continue cough syrup and inhalers. She was still at significant risk of aspiration, patient decides to drink and knows risk and benefits and its willing to forego on a diet. Risk and benefits were explained. 2/2 blood cultures show gram-positive cocci in clusters has remained afebrile with no leukocytosis currently on Augmentin and continues to improve. We'll await further results  Dysphagia: After review of his chart he has had significant risk of aspiration, patient interested in drinking at home despite knowing the risk. Barium swallow showed Patient presents with mild oral and severe pharyngeal dysphagia with sensorimotor deficits Cont. water per tube.  Hypomagnesemia: Repleted overly.   Thrombocytopathia [facility]) Patient is a 89 no signs of overt bleeding.  DM (diabetes mellitus) ([place]) type II: A1c 6.9 continue sliding scale insulin.  Hypothyroid Continue Synthroid.  Chronic atrial fibrillation: CHA2DS2-VASc Scoreis 4 Not candidate for anti-coagulation due to history of GI bleed. Heart rate well controlled.  Urinary  retention: Foley catheter placed, will give a voiding trial.  Severe protein caloric malnutrition: X9 continue enteral feedings.  Acute confusional state/Acute hypoactive delirium due to multiple etiologies Palliative care started Zyprexa  Palliative care encounter Home with palliative  Chronic lower extremity pain: Resume home dose narcotics.  2 / 2 gram-positive cocci in clusters blood cultures: Repeat blood cultures 2 has remained afebrile and leukocytosis. Questionable due to contaminated.  DVT prophylaxis: SCD Family Communication:daughter Disposition Plan/Barrier to D/C: home in 1-2 days Code Status:     Code Status Orders        Start     Ordered   11/03/16 1101  Do not attempt resuscitation (DNR)  Continuous    Question Answer Comment  In the event of cardiac or respiratory ARREST Do not call a "code blue"   In the event of cardiac or respiratory ARREST Do not perform Intubation, CPR, defibrillation or ACLS   In the event of cardiac or respiratory ARREST Use medication by any route, position, wound care, and other measures to relive pain and suffering. May use oxygen, suction and manual treatment of airway obstruction as needed for comfort.      11/03/16 1100    Code Status History    Date Active Date Inactive Code Status Order ID Comments User Context   11/03/2016 12:45 AM 11/03/2016 11:00 AM Full Code [id number]  Ivor Costa, MD ED   08/31/2016 11:50 PM 09/05/2016  9:14 PM DNR [id number]  Edwin Dada, MD Inpatient   10/10/2015  2:43 PM 10/13/2015  7:21 PM Full Code [id number]  Melton Alar, PA-C Inpatient   09/10/2014  6:31 PM 09/12/2014  5:57 PM Full Code [id number]  Janece Canterbury, MD Inpatient   10/15/2013  6:19 AM 10/19/2013  4:45 PM Full Code [id number]  Orvan Falconer, MD Inpatient        IV Access:    Peripheral IV   Procedures and diagnostic studies:   Ct Abdomen Pelvis Wo Contrast  Result Date: 11/06/2016 CLINICAL DATA:  82 year old male  presenting with fever, cough and dyspnea starting 11/05/2016. Fever to 104.2. EXAM: CT  CHEST, ABDOMEN AND PELVIS WITHOUT CONTRAST TECHNIQUE: Multidetector CT imaging of the chest, abdomen and pelvis was performed using the standard protocol without intravenous contrast. CT technologist notes that the patient was originally scheduled for a CTA of the chest, abdomen and pelvis however the IV infiltrated 57 cc of Isovue 370. Right arm was immediately elevated and ice applied to second injection. The patient's nurse and ordering clinician Dr. Jearld Shines were notified and the clinician saw the patient per report. Order was then changed to a noncontrast exam. Safety zone was filed. COMPARISON:  09/05/2016 CT abdomen, CT abdomen and pelvis from 09/02/2016 can't 11/02/2016 chest radiograph FINDINGS: CT CHEST FINDINGS Cardiovascular: The heart is mildly enlarged. There is coronary arteriosclerosis and calcifications at the aortic root. Single pacer lead is seen extending into the right ventricle. Atherosclerosis of the thoracic aorta without aneurysm. Slight dilatation of the main pulmonary artery age of 56 mm. Mediastinum/Nodes: Precarinal lymph node measuring up to 13 mm is identified short axis. 11 mm AP window lymph node short axis. No paratracheal lymphadenopathy. Lungs/Pleura: Moderate bilateral pleural effusions with compressive atelectasis. Superimposed upon centrilobular emphysema are areas of patchy airspace opacities involving the right upper lobe, right middle lobe and right lower lobe. Lesser degree of involvement seen on the left adjacent to the left heart border within the left upper lobe and lingula. Musculoskeletal: Extravasated contrast medium noted along the visualized right upper extremity to base of neck on the right. No osteolytic or osteoblastic disease within the chest. 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 No  prevertebral soft tissue swelling is seen. This may represent discontinuous calcification as opposed to posttraumatic deformity. CT ABDOMEN AND PELVIS FINDINGS Hepatobiliary: The patient is status post right hepatectomy. There is a 28 x 21 mm hypodensity in the left hepatic lobe series 2, image 75 previously estimated at 29 x 25 mm. More cephalad and medial on series 2, image 70 is a 21 x 18 mm hypodensity versus 25 x 20 mm previously. It should be noted that these are incompletely characterized and assessed due to lack of significant contrast bolus due to above-described contrast extravasation. No ductal dilatation is noted. Pancreas: Atrophic without ductal dilatation or mass. Spleen: Normal size.  No focal lesions. Adrenals/Urinary Tract: The left adrenal gland is slightly bulbous along its distal limbs. Right adrenal gland is unremarkable. There is a stable hypodensity the upper pole of the left kidney measuring approximately 15 mm and consistent with a cyst. This is stable. Urinary opacification of the renal collecting systems and bladder from what IV contrast was received prior extravasation. Foley catheter noted within the bladder. No obstructive uropathy. Stomach/Bowel: Feeding gastrojejunostomy tube with tip in the proximal jejunum seen within the left lower quadrant. No bowel obstruction. Enteric contrast reaches distal transverse colon. There appears to be segmental thickening above distal transverse and proximal descending colon with minimal pericolonic inflammation suspicious for colitis. No bowel obstruction or free air. 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 No pathologically enlarged appearing lymph nodes. Reproductive: Coarse calcifications  along the periphery of the right prostate gland. Normal size prostate. Other: Small amount of free fluid in the pelvis.  Mild anasarca. Musculoskeletal:  Stable mild L2 superior endplate wedge compression. No suspicious osteoblastic or lytic disease. IMPRESSION: Development of moderate bilateral pleural effusions with adjacent compressive atelectasis. Superimposed on centrilobular emphysema are patchy airspace opacities right greater than left suspicious pneumonia. Reactive mediastinal lymphadenopathy is also suspected. Right hepatectomy with 2 hypodense left hepatic lobe lesions that appears slightly smaller than recent comparison. It should be noted that these are suboptimally characterized due to lack of significant IV contrast bolus due to extravasation of contrast. Mild segmental thickening of distal transverse and proximal descending colon with mild pericolonic inflammation. A colitis is not entirely excluded. 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 Stable superior endplate fracture L2. 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 Electronically Signed   By: Ashley Royalty M.D.   On: 11/06/2016 21:13   Ct Head Wo Contrast  Result Date: 11/06/2016 CLINICAL DATA:  Confusion. EXAM: CT HEAD WITHOUT CONTRAST TECHNIQUE: Contiguous axial images were obtained from the base of the skull through the vertex without intravenous contrast. COMPARISON:  09/25/2012 FINDINGS: Brain: There is no evidence of acute cortical infarct, intracranial hemorrhage, mass, midline shift, or extra-axial fluid collection. Cerebral atrophy is unchanged. Periventricular white matter hypodensities are unchanged and nonspecific but compatible with minimal chronic small vessel ischemic disease. Vascular: No hyperdense vessel. Calcified atherosclerosis at the skullbase. Skull: No fracture or suspicious osseous lesion. Sinuses/Orbits: Minimal right maxillary sinus mucosal thickening, incompletely visualized. Trace mastoid effusions. Prior bilateral cataract extraction. Other: None. IMPRESSION: No evidence of acute intracranial  abnormality. Electronically Signed   By: Logan Bores M.D.   On: 11/06/2016 20:39   Ct Chest Wo Contrast  Result Date: 11/06/2016 CLINICAL DATA:  82 year old male presenting with fever, cough and dyspnea starting 11/05/2016. Fever to 104.2. EXAM: CT  CHEST, ABDOMEN AND PELVIS WITHOUT CONTRAST TECHNIQUE: Multidetector CT imaging of the chest, abdomen and pelvis was performed using the standard protocol without intravenous contrast. CT technologist notes that the patient was originally scheduled for a CTA of the chest, abdomen and pelvis however the IV infiltrated 57 cc of Isovue 370. Right arm was immediately elevated and ice applied to second injection. The patient's nurse and ordering clinician Dr. Jearld Shines were notified and the clinician saw the patient per report. Order was then changed to a noncontrast exam. Safety zone was filed. COMPARISON:  09/05/2016 CT abdomen, CT abdomen and pelvis from 09/02/2016 can't 11/02/2016 chest radiograph FINDINGS: CT CHEST FINDINGS Cardiovascular: The heart is mildly enlarged. There is coronary arteriosclerosis and calcifications at the aortic root. Single pacer lead is seen extending into the right ventricle. Atherosclerosis of the thoracic aorta without aneurysm. Slight dilatation of the main pulmonary artery age of 27 mm. Mediastinum/Nodes: Precarinal lymph node measuring up to 13 mm is identified short axis. 11 mm AP window lymph node short axis. No paratracheal lymphadenopathy. Lungs/Pleura: Moderate bilateral pleural effusions with compressive atelectasis. Superimposed upon centrilobular emphysema are areas of patchy airspace opacities involving the right upper lobe, right middle lobe and right lower lobe. Lesser degree of involvement seen on the left adjacent to the left heart border within the left upper lobe and lingula. Musculoskeletal: Extravasated contrast medium noted along the visualized right upper extremity to base of neck on the right. No osteolytic or  osteoblastic disease within the chest. There is extensive ossification of the anterior longitudinal ligament along the  visualized cervical spine disrupted at T12. No prevertebral soft tissue swelling is seen. This may represent discontinuous calcification as opposed to posttraumatic deformity. CT ABDOMEN AND PELVIS FINDINGS Hepatobiliary: The patient is status post right hepatectomy. There is a 28 x 21 mm hypodensity in the left hepatic lobe series 2, image 75 previously estimated at 29 x 25 mm. More cephalad and medial on series 2, image 70 is a 21 x 18 mm hypodensity versus 25 x 20 mm previously. It should be noted that these are incompletely characterized and assessed due to lack of significant contrast bolus due to above-described contrast extravasation. No ductal dilatation is noted. Pancreas: Atrophic without ductal dilatation or mass. Spleen: Normal size.  No focal lesions. Adrenals/Urinary Tract: The left adrenal gland is slightly bulbous along its distal limbs. Right adrenal gland is unremarkable. There is a stable hypodensity the upper pole of the left kidney measuring approximately 15 mm and consistent with a cyst. This is stable. Urinary opacification of the renal collecting systems and bladder from what IV contrast was received prior extravasation. Foley catheter noted within the bladder. No obstructive uropathy. Stomach/Bowel: Feeding gastrojejunostomy tube with tip in the proximal jejunum seen within the left lower quadrant. No bowel obstruction. Enteric contrast reaches distal transverse colon. There appears to be segmental thickening above distal transverse and proximal descending colon with minimal pericolonic inflammation suspicious for colitis. No bowel obstruction or free air. 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 No pathologically enlarged appearing lymph nodes. Reproductive: Coarse  calcifications along the periphery of the right prostate gland. Normal size prostate. Other: Small amount of free fluid in the pelvis.  Mild anasarca. Musculoskeletal: Stable mild L2 superior endplate wedge compression. No suspicious osteoblastic or lytic disease. IMPRESSION: Development of moderate bilateral pleural effusions with adjacent compressive atelectasis. Superimposed on centrilobular emphysema are patchy airspace opacities right greater than left suspicious pneumonia. Reactive mediastinal lymphadenopathy is also suspected. Right hepatectomy with 2 hypodense left hepatic lobe lesions that appears slightly smaller than recent comparison. It should be noted that these are suboptimally characterized due to lack of significant IV contrast bolus due to extravasation of contrast. Mild segmental thickening of distal transverse and proximal descending colon with mild pericolonic inflammation. A colitis is not entirely excluded. 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 Stable superior endplate fracture L2. 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 Electronically Signed   By: Ashley Royalty M.D.   On: 11/06/2016 21:13     Medical Consultants:    None.  Anti-Infectives:   Augmentin  Subjective:    Florina Ou he relates of the lower extremity pain.  Objective:    Vitals:   11/08/16 0500 11/08/16 0634 11/08/16 0830 11/08/16 0835  BP:  122/68 (!) 150/74   Pulse:  (!) 55 62   Resp:  14 14   Temp:  97.5 F (36.4 C) 98.6 F (37 C)   TempSrc:  Axillary Axillary   SpO2:  100% 96% 96%  Weight: 54.5 kg (120 lb 2.4 oz)     Height:        Intake/Output Summary (Last 24 hours) at 11/08/16 1250 Last data filed at 11/08/16 1246  Gross per 24 hour  Intake             1050 ml  Output              800 ml  Net  250 ml   Filed Weights   11/06/16 0610 11/07/16 0645 11/08/16 0500  Weight: 53.3 kg (117 lb 8.1 oz) 54.7 kg  (120 lb 9.5 oz) 54.5 kg (120 lb 2.4 oz)    Exam: General exam: In no acute distress.cachectic Respiratory system: Good air movement and clear to auscultation. Cardiovascular system: S1 & S2 heard, RRR.  Gastrointestinal system: Abdomen is nondistended, soft and nontender.  Extremities: No pedal edema. Skin: No rashes, lesions or ulcers Psychiatry: Judgement and insight appear normal. Mood & affect appropriate.    Data Reviewed:    Labs: Basic Metabolic Panel:  Recent Labs Lab 11/02/16 2311 11/03/16 0338 11/05/16 0354 11/06/16 0338 11/07/16 0402  NA 138  --  142 145 137  K 3.9  --  3.7 3.5 3.6  CL 105  --  113* 113* 104  CO2 25  --  22 24 27   GLUCOSE 115*  --  234* 220* 254*  BUN 24*  --  20 16 15   CREATININE 0.97  --  1.00 0.78 0.77  CALCIUM 9.2  --  8.0* 8.0* 7.9*  MG  --  1.4* 1.9 1.7  --    GFR Estimated Creatinine Clearance: 53.9 mL/min (by C-G formula based on SCr of 0.77 mg/dL). Liver Function Tests:  Recent Labs Lab 11/02/16 2311 11/05/16 0354 11/07/16 0402  AST 44* 38 42*  ALT 18 16* 19  ALKPHOS 126 58 92  BILITOT 2.2* 1.0 1.4*  PROT 7.8 5.1* 5.5*  ALBUMIN 4.0 2.4* 2.2*   No results for input(s): LIPASE, AMYLASE in the last 168 hours.  Recent Labs Lab 11/06/16 1310  AMMONIA 46*   Coagulation profile No results for input(s): INR, PROTIME in the last 168 hours.  CBC:  Recent Labs Lab 11/02/16 2311 11/05/16 0354 11/06/16 0338  WBC 6.4 4.6 6.6  NEUTROABS 5.4 3.4 5.0  HGB 14.5 11.0* 12.1*  HCT 42.7 33.6* 36.8*  MCV 97.3 100.6* 100.3*  PLT 96* 76* 82*   Cardiac Enzymes: No results for input(s): CKTOTAL, CKMB, CKMBINDEX, TROPONINI in the last 168 hours. BNP (last 3 results) No results for input(s): PROBNP in the last 8760 hours. CBG:  Recent Labs Lab 11/07/16 1257 11/07/16 1703 11/07/16 2303 11/08/16 0719 11/08/16 1135  GLUCAP 223* 231* 216* 111* 111*   D-Dimer: No results for input(s): DDIMER in the last 72 hours. Hgb  A1c: No results for input(s): HGBA1C in the last 72 hours. Lipid Profile: No results for input(s): CHOL, HDL, LDLCALC, TRIG, CHOLHDL, LDLDIRECT in the last 72 hours. Thyroid function studies:  Recent Labs  11/06/16 1335  TSH 5.945*   Anemia work up: No results for input(s): VITAMINB12, FOLATE, FERRITIN, TIBC, IRON, RETICCTPCT in the last 72 hours. Sepsis Labs:  Recent Labs Lab 11/02/16 2308 11/02/16 2311 11/03/16 0120 11/03/16 0349 11/05/16 0354 11/06/16 0338  PROCALCITON  --   --  3.14  --   --   --   WBC  --  6.4  --   --  4.6 6.6  LATICACIDVEN 1.3  --  1.3 1.0  --   --    Microbiology Recent Results (from the past 240 hour(s))  Culture, blood (routine x 2)     Status: None (Preliminary result)   Collection Time: 11/02/16 11:10 PM  Result Value Ref Range Status   Specimen Description BLOOD LEFT ANTECUBITAL  Final   Special Requests BOTTLES DRAWN AEROBIC AND ANAEROBIC 5ML  Final   Culture   Final    NO GROWTH  4 DAYS Performed at [facility]    Report Status PENDING  Incomplete  Culture, blood (routine x 2)     Status: None (Preliminary result)   Collection Time: 11/02/16 11:30 PM  Result Value Ref Range Status   Specimen Description BLOOD BLOOD LEFT FOREARM  Final   Special Requests BOTTLES DRAWN AEROBIC AND ANAEROBIC 5ML EA  Final   Culture   Final    NO GROWTH 4 DAYS Performed at [facility]    Report Status PENDING  Incomplete  Urine culture     Status: None   Collection Time: 11/03/16 12:44 AM  Result Value Ref Range Status   Specimen Description URINE, RANDOM  Final   Special Requests NONE  Final   Culture NO GROWTH Performed at [facility]   Final   Report Status 11/05/2016 FINAL  Final  Respiratory Panel by PCR     Status: None   Collection Time: 11/04/16 12:09 AM  Result Value Ref Range Status   Adenovirus NOT DETECTED NOT DETECTED Final   Coronavirus 229E NOT DETECTED NOT DETECTED Final   Coronavirus HKU1 NOT DETECTED  NOT DETECTED Final   Coronavirus NL63 NOT DETECTED NOT DETECTED Final   Coronavirus OC43 NOT DETECTED NOT DETECTED Final   Metapneumovirus NOT DETECTED NOT DETECTED Final   Rhinovirus / Enterovirus NOT DETECTED NOT DETECTED Final   Influenza A NOT DETECTED NOT DETECTED Final   Influenza B NOT DETECTED NOT DETECTED Final   Parainfluenza Virus 1 NOT DETECTED NOT DETECTED Final   Parainfluenza Virus 2 NOT DETECTED NOT DETECTED Final   Parainfluenza Virus 3 NOT DETECTED NOT DETECTED Final   Parainfluenza Virus 4 NOT DETECTED NOT DETECTED Final   Respiratory Syncytial Virus NOT DETECTED NOT DETECTED Final   Bordetella pertussis NOT DETECTED NOT DETECTED Final   Chlamydophila pneumoniae NOT DETECTED NOT DETECTED Final   Mycoplasma pneumoniae NOT DETECTED NOT DETECTED Final    Comment: Performed at [facility]  Culture, blood (Routine X 2) w Reflex to ID Panel     Status: None (Preliminary result)   Collection Time: 11/06/16  5:15 PM  Result Value Ref Range Status   Specimen Description BLOOD LEFT HAND  Final   Special Requests IN PEDIATRIC BOTTLE .5CC  Final   Culture  Setup Time   Final    GRAM POSITIVE COCCI IN CLUSTERS AEROBIC BOTTLE ONLY CRITICAL RESULT CALLED TO, READ BACK BY AND VERIFIED WITHRichrd Sox PHARMD 2019 11/8/147 A BROWNING Performed at [place]  Final   Report Status PENDING  Incomplete  Culture, blood (Routine X 2) w Reflex to ID Panel     Status: None (Preliminary result)   Collection Time: 11/06/16  5:20 PM  Result Value Ref Range Status   Specimen Description BLOOD LEFT HAND  Final   Special Requests IN PEDIATRIC BOTTLE .5CC  Final   Culture  Setup Time   Final    GRAM POSITIVE COCCI IN CLUSTERS AEROBIC BOTTLE ONLY CRITICAL VALUE NOTED.  VALUE IS CONSISTENT WITH PREVIOUSLY REPORTED AND CALLED VALUE. Performed at [place]  Final   Report Status PENDING  Incomplete      Medications:   . amoxicillin-clavulanate  875 mg Oral Q12H  . aspirin  81 mg Per Tube QHS  . B-complex with vitamin C  1 tablet Oral Daily  . budesonide  0.25 mg Nebulization BID  .  cyproheptadine  4 mg Per Tube QHS  . ergocalciferol  50,000 Units Oral Weekly  . feeding supplement (OSMOLITE 1.5 CAL)  1,000 mL Per Tube Q24H  . free water  200 mL Per Tube Q8H  . insulin aspart  0-5 Units Subcutaneous QHS  . insulin aspart  0-9 Units Subcutaneous TID WC  . ipratropium-albuterol  3 mL Nebulization TID  . levothyroxine  88 mcg Per Tube QAC breakfast  . lip balm      . OLANZapine  2.5 mg Oral QHS  . polyethylene glycol  8.5 g Per Tube Daily  . sennosides  10 mL Per Tube BID  . sodium chloride  1,000 mL Intravenous Once  . tiotropium  18 mcg Inhalation Daily   Continuous Infusions:   Time spent: 25 min   LOS: 5 days   Charlynne Cousins  [facility] Pager [phone]  *Please refer to [email or web address], password [email or web address] to get updated schedule on who will round on this patient, as hospitalists switch teams weekly. If 7PM-7AM, please contact night-coverage at [email or web address], password [email or web address] for any overnight needs.  11/08/2016, 12:50 PM

## 2016-11-09 ENCOUNTER — Encounter: Payer: Self-pay | Admitting: *Deleted

## 2016-11-09 DIAGNOSIS — R6520 Severe sepsis without septic shock: Secondary | ICD-10-CM

## 2016-11-09 DIAGNOSIS — I480 Paroxysmal atrial fibrillation: Secondary | ICD-10-CM

## 2016-11-09 DIAGNOSIS — M311 Thrombotic microangiopathy: Secondary | ICD-10-CM

## 2016-11-09 DIAGNOSIS — C228 Malignant neoplasm of liver, primary, unspecified as to type: Secondary | ICD-10-CM

## 2016-11-09 DIAGNOSIS — Z800 Family history of malignant neoplasm of digestive organs: Secondary | ICD-10-CM

## 2016-11-09 DIAGNOSIS — J690 Pneumonitis due to inhalation of food and vomit: Secondary | ICD-10-CM

## 2016-11-09 DIAGNOSIS — R1084 Generalized abdominal pain: Secondary | ICD-10-CM

## 2016-11-09 DIAGNOSIS — R109 Unspecified abdominal pain: Secondary | ICD-10-CM

## 2016-11-09 DIAGNOSIS — Z9089 Acquired absence of other organs: Secondary | ICD-10-CM

## 2016-11-09 DIAGNOSIS — Z8379 Family history of other diseases of the digestive system: Secondary | ICD-10-CM

## 2016-11-09 DIAGNOSIS — Z8249 Family history of ischemic heart disease and other diseases of the circulatory system: Secondary | ICD-10-CM

## 2016-11-09 DIAGNOSIS — Z7189 Other specified counseling: Secondary | ICD-10-CM

## 2016-11-09 DIAGNOSIS — Z950 Presence of cardiac pacemaker: Secondary | ICD-10-CM

## 2016-11-09 DIAGNOSIS — T827XXA Infection and inflammatory reaction due to other cardiac and vascular devices, implants and grafts, initial encounter: Secondary | ICD-10-CM

## 2016-11-09 DIAGNOSIS — J439 Emphysema, unspecified: Secondary | ICD-10-CM

## 2016-11-09 DIAGNOSIS — Z87891 Personal history of nicotine dependence: Secondary | ICD-10-CM

## 2016-11-09 DIAGNOSIS — Z978 Presence of other specified devices: Secondary | ICD-10-CM

## 2016-11-09 DIAGNOSIS — R7881 Bacteremia: Secondary | ICD-10-CM

## 2016-11-09 DIAGNOSIS — A411 Sepsis due to other specified staphylococcus: Secondary | ICD-10-CM

## 2016-11-09 LAB — BLOOD CULTURE ID PANEL (REFLEXED)
ACINETOBACTER BAUMANNII: NOT DETECTED
CANDIDA ALBICANS: NOT DETECTED
CANDIDA GLABRATA: NOT DETECTED
CANDIDA KRUSEI: NOT DETECTED
CANDIDA PARAPSILOSIS: NOT DETECTED
Candida tropicalis: NOT DETECTED
ESCHERICHIA COLI: NOT DETECTED
Enterobacter cloacae complex: NOT DETECTED
Enterobacteriaceae species: NOT DETECTED
Enterococcus species: NOT DETECTED
Haemophilus influenzae: NOT DETECTED
KLEBSIELLA OXYTOCA: NOT DETECTED
Klebsiella pneumoniae: NOT DETECTED
Listeria monocytogenes: NOT DETECTED
Methicillin resistance: DETECTED — AB
Neisseria meningitidis: NOT DETECTED
PSEUDOMONAS AERUGINOSA: NOT DETECTED
Proteus species: NOT DETECTED
SERRATIA MARCESCENS: NOT DETECTED
STAPHYLOCOCCUS AUREUS BCID: NOT DETECTED
STREPTOCOCCUS PNEUMONIAE: NOT DETECTED
STREPTOCOCCUS PYOGENES: NOT DETECTED
Staphylococcus species: DETECTED — AB
Streptococcus agalactiae: NOT DETECTED
Streptococcus species: NOT DETECTED

## 2016-11-09 LAB — GLUCOSE, CAPILLARY
GLUCOSE-CAPILLARY: 106 mg/dL — AB (ref 65–99)
Glucose-Capillary: 255 mg/dL — ABNORMAL HIGH (ref 65–99)
Glucose-Capillary: 158 mg/dL — ABNORMAL HIGH (ref 65–99)
Glucose-Capillary: 235 mg/dL — ABNORMAL HIGH (ref 65–99)

## 2016-11-09 LAB — CULTURE, BLOOD (ROUTINE X 2)

## 2016-11-09 MED ORDER — IPRATROPIUM-ALBUTEROL 0.5-2.5 (3) MG/3ML IN SOLN
3.0000 mL | Freq: Two times a day (BID) | RESPIRATORY_TRACT | Status: DC
  Administered 2016-11-09 – 2016-11-10 (×2): 3 mL via RESPIRATORY_TRACT
  Filled 2016-11-09 (×2): qty 3

## 2016-11-09 MED ORDER — SODIUM CHLORIDE 0.9 % IV SOLN
500.0000 mg | Freq: Two times a day (BID) | INTRAVENOUS | Status: DC
  Administered 2016-11-10: 500 mg via INTRAVENOUS
  Filled 2016-11-09 (×2): qty 500

## 2016-11-09 MED ORDER — VANCOMYCIN HCL IN DEXTROSE 1-5 GM/200ML-% IV SOLN
1000.0000 mg | Freq: Once | INTRAVENOUS | Status: AC
  Administered 2016-11-09: 1000 mg via INTRAVENOUS
  Filled 2016-11-09: qty 200

## 2016-11-09 NOTE — Progress Notes (Signed)
Feeding tube clogged per nursing report. Started Feeding tube unclogging standing orders. Patency restored after one dose of pancreatic enzymes and sodium bicarb. Medications administered and overnight tube feeding started.  [name]
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
## 2016-11-09 NOTE — Progress Notes (Signed)
Pharmacy Antibiotic Note  [name] is a 82 y.o. male admitted on 11/02/2016 with sepsis.  Pharmacy has been consulted for vancomycin dosing.  Plan: Vancomycin 1000 mg IV x1, then vancomycin 500 mg IV q12h  Height: 5\' 9"  (175.3 cm) Weight: 142 lb 3.2 oz (64.5 kg) IBW/kg (Calculated) : 70.7  Temp (24hrs), Avg:98.8 F (37.1 C), Min:98.7 F (37.1 C), Max:98.9 F (37.2 C)   Recent Labs Lab 11/02/16 2308 11/02/16 2311 11/03/16 0120 11/03/16 0349 11/05/16 0354 11/06/16 0338 11/07/16 0402  WBC  --  6.4  --   --  4.6 6.6  --   CREATININE  --  0.97  --   --  1.00 0.78 0.77  LATICACIDVEN 1.3  --  1.3 1.0  --   --   --     Estimated Creatinine Clearance: 63.8 mL/min (by C-G formula based on SCr of 0.77 mg/dL).    No Known Allergies  Antimicrobials this admission: 11/4 Vanc >> 11/6, 11/8 1gm x 1 11/10 >> 11/4 Cefepime >> 11/6 11/6 Augmentin >> 11/10   Dose adjustments this admission: ---   Microbiology results: 11/3 BCx x2: ngtd 11/4 influ PCR (-) 11/5 resp panel PCR: neg 11/4 UCx: NGF 11/4 Urine strep: neg 11/7 BCx: methicillin-susc CoNS 11/9: BC: 1/2 GPC clusters (BCID methicillin-RESISTANT CoNS)  Thank you for allowing pharmacy to be a part of this patient's care.   Royetta Asal, PharmD, BCPS Pager [phone] 11/09/2016 2:54 PM

## 2016-11-09 NOTE — Progress Notes (Signed)
[facility] PROGRESS NOTE    Progress Note  [name]  [id number] DOB: 24-Dec-1931 DOA: 11/02/2016 PCP: Conni Slipper, NP    Brief Narrative:   [name] is an 82 y.o. male past medical history significant of essential hypertension diabetes mellitus type 2, hypothyroidism status post pacemaker atrial fibrillation not on anticoagulation, hepatocellular carcinoma status post right lower lobe resection in 2012, PEG tube on enteral feedings who presents with fever cough and shortness of breath to the emergency room.  Assessment/Plan:   Acute encephalopathy: Likely due to Infectious etiology, they did resolve then became confused again question of it was due to question if this is due to hypoactive delirium.  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 DC Augmentin will start him on IV vancomycin. [facility] and possible device removal. Daughters had a hard time making decisions as she relates he keeps getting complication after complications, she is unsure whether to proceed with comfort care she will like to meet with hospice and palliative care and will like to discuss it with family before making any decisions.  Acute on chronic respiratory failure with hypoxia (HCC) and mild sepsis due to aspiration pneumonia: He was still at significant risk of aspiration, patient decides to drink and knows risk and benefits and its willing to forego on a diet. Risk and benefits were explained.  Dysphagia: After review of his chart he has had significant risk of aspiration, patient interested in drinking at home despite knowing the risk. Barium swallow showed Patient presents with mild oral and severe pharyngeal dysphagia with sensorimotor deficits Cont. water per tube.  Hypomagnesemia: Repleted overly.     Thrombocytopathia [facility]) Patient is a 89 no signs of overt bleeding.  DM (diabetes mellitus) ([place]) type II: A1c 6.9 continue sliding scale insulin.  Hypothyroid Continue Synthroid.  Chronic atrial fibrillation: CHA2DS2-VASc Scoreis 4 Not candidate for anti-coagulation due to history of GI bleed. Heart rate well controlled.  Urinary retention: Foley catheter placed, will give a voiding trial.  Severe protein caloric malnutrition: X9 continue enteral feedings.  Acute confusional state/Acute hypoactive delirium due to multiple etiologies Palliative care started Zyprexa  Palliative care encounter Home with palliative  Chronic lower extremity pain: Resume home dose narcotics.     DVT prophylaxis: SCD Family Communication:daughter Disposition Plan/Barrier to D/C: home in 1-2 days Code Status:     Code Status Orders        Start     Ordered   11/03/16 1101  Do not attempt resuscitation (DNR)  Continuous    Question Answer Comment  In the event of cardiac or respiratory ARREST Do not call a "code blue"   In the event of cardiac or respiratory ARREST Do not perform Intubation, CPR, defibrillation or ACLS   In the event of cardiac or respiratory ARREST Use medication by any route, position, wound care, and other measures to relive pain and suffering. May use oxygen, suction and manual treatment of airway obstruction as needed for comfort.      11/03/16 1100    Code Status History    Date Active Date Inactive Code Status Order ID Comments User Context   11/03/2016 12:45 AM 11/03/2016 11:00 AM Full Code [id number]  Samuel Costa, MD ED   08/31/2016 11:50 PM 09/05/2016  9:14 PM DNR [id number]  Edwin Dada, MD Inpatient  10/10/2015  2:43 PM 10/13/2015  7:21 PM Full Code [id number]  Melton Alar, PA-C Inpatient   09/10/2014  6:31 PM 09/12/2014  5:57 PM Full Code [id number]  Janece Canterbury, MD Inpatient   10/15/2013  6:19 AM 10/19/2013  4:45 PM Full Code [id number]  Orvan Falconer,  MD Inpatient        IV Access:    Peripheral IV   Procedures and diagnostic studies:   No results found.   Medical Consultants:    None.  Anti-Infectives:   Augmentin  Subjective:    [name] he relates of the lower extremity pain.  Objective:    Vitals:   11/08/16 2300 11/09/16 0403 11/09/16 0544 11/09/16 0911  BP: (!) 140/58  (!) 163/68   Pulse: 70  71   Resp: 16  16   Temp: 98.9 F (37.2 C)     TempSrc: Oral     SpO2: 100%  99% 98%  Weight:  64.5 kg (142 lb 3.2 oz)    Height:        Intake/Output Summary (Last 24 hours) at 11/09/16 1408 Last data filed at 11/09/16 1000  Gross per 24 hour  Intake               70 ml  Output             1125 ml  Net            -1055 ml   Filed Weights   11/07/16 0645 11/08/16 0500 11/09/16 0403  Weight: 54.7 kg (120 lb 9.5 oz) 54.5 kg (120 lb 2.4 oz) 64.5 kg (142 lb 3.2 oz)    Exam: General exam: In no acute distress.cachectic Respiratory system: Good air movement and clear to auscultation. Cardiovascular system: S1 & S2 heard, RRR.  Gastrointestinal system: Abdomen is nondistended, soft and nontender.  Extremities: No pedal edema. Skin: No rashes, lesions or ulcers Psychiatry: Judgement and insight appear normal. Mood & affect appropriate.    Data Reviewed:    Labs: Basic Metabolic Panel:  Recent Labs Lab 11/02/16 2311 11/03/16 0338 11/05/16 0354 11/06/16 0338 11/07/16 0402  NA 138  --  142 145 137  K 3.9  --  3.7 3.5 3.6  CL 105  --  113* 113* 104  CO2 25  --  22 24 27   GLUCOSE 115*  --  234* 220* 254*  BUN 24*  --  20 16 15   CREATININE 0.97  --  1.00 0.78 0.77  CALCIUM 9.2  --  8.0* 8.0* 7.9*  MG  --  1.4* 1.9 1.7  --    GFR Estimated Creatinine Clearance: 63.8 mL/min (by C-G formula based on SCr of 0.77 mg/dL). Liver Function Tests:  Recent Labs Lab 11/02/16 2311 11/05/16 0354 11/07/16 0402  AST 44* 38 42*  ALT 18 16* 19  ALKPHOS 126 58 92  BILITOT 2.2* 1.0 1.4*  PROT  7.8 5.1* 5.5*  ALBUMIN 4.0 2.4* 2.2*   No results for input(s): LIPASE, AMYLASE in the last 168 hours.  Recent Labs Lab 11/06/16 1310  AMMONIA 46*   Coagulation profile No results for input(s): INR, PROTIME in the last 168 hours.  CBC:  Recent Labs Lab 11/02/16 2311 11/05/16 0354 11/06/16 0338  WBC 6.4 4.6 6.6  NEUTROABS 5.4 3.4 5.0  HGB 14.5 11.0* 12.1*  HCT 42.7 33.6* 36.8*  MCV 97.3 100.6* 100.3*  PLT 96* 76* 82*   Cardiac Enzymes: No results for input(s): CKTOTAL, CKMB, CKMBINDEX,  TROPONINI in the last 168 hours. BNP (last 3 results) No results for input(s): PROBNP in the last 8760 hours. CBG:  Recent Labs Lab 11/08/16 1135 11/08/16 1659 11/08/16 2317 11/09/16 0740 11/09/16 1153  GLUCAP 111* 120* 92 255* 158*   D-Dimer: No results for input(s): DDIMER in the last 72 hours. Hgb A1c: No results for input(s): HGBA1C in the last 72 hours. Lipid Profile: No results for input(s): CHOL, HDL, LDLCALC, TRIG, CHOLHDL, LDLDIRECT in the last 72 hours. Thyroid function studies: No results for input(s): TSH, T4TOTAL, T3FREE, THYROIDAB in the last 72 hours.  Invalid input(s): FREET3 Anemia work up: No results for input(s): VITAMINB12, FOLATE, FERRITIN, TIBC, IRON, RETICCTPCT in the last 72 hours. Sepsis Labs:  Recent Labs Lab 11/02/16 2308 11/02/16 2311 11/03/16 0120 11/03/16 0349 11/05/16 0354 11/06/16 0338  PROCALCITON  --   --  3.14  --   --   --   WBC  --  6.4  --   --  4.6 6.6  LATICACIDVEN 1.3  --  1.3 1.0  --   --    Microbiology Recent Results (from the past 240 hour(s))  Culture, blood (routine x 2)     Status: None   Collection Time: 11/02/16 11:10 PM  Result Value Ref Range Status   Specimen Description BLOOD LEFT ANTECUBITAL  Final   Special Requests BOTTLES DRAWN AEROBIC AND ANAEROBIC 5ML  Final   Culture   Final    NO GROWTH 5 DAYS Performed at [facility]    Report Status 11/08/2016 FINAL  Final  Culture, blood (routine x 2)      Status: None   Collection Time: 11/02/16 11:30 PM  Result Value Ref Range Status   Specimen Description BLOOD BLOOD LEFT FOREARM  Final   Special Requests BOTTLES DRAWN AEROBIC AND ANAEROBIC 5ML EA  Final   Culture   Final    NO GROWTH 5 DAYS Performed at [facility]    Report Status 11/08/2016 FINAL  Final  Urine culture     Status: None   Collection Time: 11/03/16 12:44 AM  Result Value Ref Range Status   Specimen Description URINE, RANDOM  Final   Special Requests NONE  Final   Culture NO GROWTH Performed at [facility]   Final   Report Status 11/05/2016 FINAL  Final  Respiratory Panel by PCR     Status: None   Collection Time: 11/04/16 12:09 AM  Result Value Ref Range Status   Adenovirus NOT DETECTED NOT DETECTED Final   Coronavirus 229E NOT DETECTED NOT DETECTED Final   Coronavirus HKU1 NOT DETECTED NOT DETECTED Final   Coronavirus NL63 NOT DETECTED NOT DETECTED Final   Coronavirus OC43 NOT DETECTED NOT DETECTED Final   Metapneumovirus NOT DETECTED NOT DETECTED Final   Rhinovirus / Enterovirus NOT DETECTED NOT DETECTED Final   Influenza A NOT DETECTED NOT DETECTED Final   Influenza B NOT DETECTED NOT DETECTED Final   Parainfluenza Virus 1 NOT DETECTED NOT DETECTED Final   Parainfluenza Virus 2 NOT DETECTED NOT DETECTED Final   Parainfluenza Virus 3 NOT DETECTED NOT DETECTED Final   Parainfluenza Virus 4 NOT DETECTED NOT DETECTED Final   Respiratory Syncytial Virus NOT DETECTED NOT DETECTED Final   Bordetella pertussis NOT DETECTED NOT DETECTED Final   Chlamydophila pneumoniae NOT DETECTED NOT DETECTED Final   Mycoplasma pneumoniae NOT DETECTED NOT DETECTED Final    Comment: Performed at [facility]  Culture, blood (Routine X 2) w  Reflex to ID Panel     Status: Abnormal   Collection Time: 11/06/16  5:15 PM  Result Value Ref Range Status   Specimen Description BLOOD LEFT HAND  Final   Special Requests IN PEDIATRIC BOTTLE .5CC  Final   Culture   Setup Time   Final    GRAM POSITIVE COCCI IN CLUSTERS AEROBIC BOTTLE ONLY CRITICAL RESULT CALLED TO, READ BACK BY AND VERIFIED WITHRichrd Sox PHARMD 2019 11/8/147 A BROWNING Performed at [facility]    Culture STAPHYLOCOCCUS SPECIES (COAGULASE NEGATIVE) (A)  Final   Report Status 11/09/2016 FINAL  Final   Organism ID, Bacteria STAPHYLOCOCCUS SPECIES (COAGULASE NEGATIVE)  Final      Susceptibility   Staphylococcus species (coagulase negative) - MIC*    CIPROFLOXACIN <=0.5 SENSITIVE Sensitive     ERYTHROMYCIN <=0.25 SENSITIVE Sensitive     GENTAMICIN <=0.5 SENSITIVE Sensitive     OXACILLIN <=0.25 SENSITIVE Sensitive     TETRACYCLINE <=1 SENSITIVE Sensitive     VANCOMYCIN <=0.5 SENSITIVE Sensitive     TRIMETH/SULFA <=10 SENSITIVE Sensitive     CLINDAMYCIN <=0.25 SENSITIVE Sensitive     RIFAMPIN <=0.5 SENSITIVE Sensitive     Inducible Clindamycin NEGATIVE Sensitive     * STAPHYLOCOCCUS SPECIES (COAGULASE NEGATIVE)  Culture, blood (Routine X 2) w Reflex to ID Panel     Status: Abnormal   Collection Time: 11/06/16  5:20 PM  Result Value Ref Range Status   Specimen Description BLOOD LEFT HAND  Final   Special Requests IN PEDIATRIC BOTTLE .5CC  Final   Culture  Setup Time   Final    GRAM POSITIVE COCCI IN CLUSTERS AEROBIC BOTTLE ONLY CRITICAL VALUE NOTED.  VALUE IS CONSISTENT WITH PREVIOUSLY REPORTED AND CALLED VALUE.    Culture (A)  Final    STAPHYLOCOCCUS SPECIES (COAGULASE NEGATIVE) SUSCEPTIBILITIES PERFORMED ON PREVIOUS CULTURE WITHIN THE LAST 5 DAYS. Performed at [facility]    Report Status 11/09/2016 FINAL  Final  Culture, blood (Routine X 2) w Reflex to ID Panel     Status: None (Preliminary result)   Collection Time: 11/08/16  1:24 PM  Result Value Ref Range Status   Specimen Description BLOOD LEFT ARM  Final   Special Requests IN PEDIATRIC BOTTLE [place]  Final   Culture   Final    NO GROWTH < 24 HOURS Performed at [facility]    Report Status  PENDING  Incomplete  Culture, blood (Routine X 2) w Reflex to ID Panel     Status: None (Preliminary result)   Collection Time: 11/08/16  1:24 PM  Result Value Ref Range Status   Specimen Description BLOOD LEFT ARM  Final   Special Requests IN PEDIATRIC BOTTLE 2CC  Final   Culture  Setup Time   Final    GRAM POSITIVE COCCI IN CLUSTERS AEROBIC BOTTLE ONLY Organism ID to follow CRITICAL RESULT CALLED TO, READ BACK BY AND VERIFIED WITH: D ZEIGLER 11/09/16 @ 1331 M VESTAL Performed at [place]  Final   Report Status PENDING  Incomplete  Blood Culture ID Panel (Reflexed)     Status: Abnormal   Collection Time: 11/08/16  1:24 PM  Result Value Ref Range Status   Enterococcus species NOT DETECTED NOT DETECTED Final   Listeria monocytogenes NOT DETECTED NOT DETECTED Final   Staphylococcus species DETECTED (A) NOT DETECTED Final    Comment: CRITICAL RESULT CALLED TO, READ BACK BY AND VERIFIED WITH:  D ZEIGLER 11/09/16 @ 1331 M VESTAL    Staphylococcus aureus NOT DETECTED NOT DETECTED Final   Methicillin resistance DETECTED (A) NOT DETECTED Final    Comment: CRITICAL RESULT CALLED TO, READ BACK BY AND VERIFIED WITH: D ZEIGLER 11/09/16 @ 1331 M VESTAL    Streptococcus species NOT DETECTED NOT DETECTED Final   Streptococcus agalactiae NOT DETECTED NOT DETECTED Final   Streptococcus pneumoniae NOT DETECTED NOT DETECTED Final   Streptococcus pyogenes NOT DETECTED NOT DETECTED Final   Acinetobacter baumannii NOT DETECTED NOT DETECTED Final   Enterobacteriaceae species NOT DETECTED NOT DETECTED Final   Enterobacter cloacae complex NOT DETECTED NOT DETECTED Final   Escherichia coli NOT DETECTED NOT DETECTED Final   Klebsiella oxytoca NOT DETECTED NOT DETECTED Final   Klebsiella pneumoniae NOT DETECTED NOT DETECTED Final   Proteus species NOT DETECTED NOT DETECTED Final   Serratia marcescens NOT DETECTED NOT DETECTED Final   Haemophilus influenzae NOT  DETECTED NOT DETECTED Final   Neisseria meningitidis NOT DETECTED NOT DETECTED Final   Pseudomonas aeruginosa NOT DETECTED NOT DETECTED Final   Candida albicans NOT DETECTED NOT DETECTED Final   Candida glabrata NOT DETECTED NOT DETECTED Final   Candida krusei NOT DETECTED NOT DETECTED Final   Candida parapsilosis NOT DETECTED NOT DETECTED Final   Candida tropicalis NOT DETECTED NOT DETECTED Final    Comment: Performed at [facility]     Medications:   . amoxicillin-clavulanate  875 mg Oral Q12H  . aspirin  81 mg Per Tube QHS  . B-complex with vitamin C  1 tablet Oral Daily  . budesonide  0.25 mg Nebulization BID  . cyproheptadine  4 mg Per Tube QHS  . ergocalciferol  50,000 Units Oral Weekly  . feeding supplement (OSMOLITE 1.5 CAL)  1,000 mL Per Tube Q24H  . free water  200 mL Per Tube Q8H  . insulin aspart  0-5 Units Subcutaneous QHS  . insulin aspart  0-9 Units Subcutaneous TID WC  . ipratropium-albuterol  3 mL Nebulization BID  . levothyroxine  88 mcg Per Tube QAC breakfast  . OLANZapine  2.5 mg Oral QHS  . polyethylene glycol  8.5 g Per Tube Daily  . sennosides  10 mL Per Tube BID  . sodium chloride  1,000 mL Intravenous Once  . tiotropium  18 mcg Inhalation Daily   Continuous Infusions:   Time spent: 25 min   LOS: 6 days   [name]  [facility] Pager [phone]  *Please refer to [email or web address], password [email or web address] to get updated schedule on who will round on this patient, as hospitalists switch teams weekly. If 7PM-7AM, please contact night-coverage at [email or web address], password [email or web address] for any overnight needs.  11/09/2016, 2:08 PM

## 2016-11-09 NOTE — Progress Notes (Signed)
PHARMACY - PHYSICIAN COMMUNICATION CRITICAL VALUE ALERT - BLOOD CULTURE IDENTIFICATION (BCID)  Results for orders placed or performed during the hospital encounter of 11/02/16  Blood Culture ID Panel (Reflexed) (Collected: 11/08/2016  1:24 PM)  Result Value Ref Range   Enterococcus species NOT DETECTED NOT DETECTED   Listeria monocytogenes NOT DETECTED NOT DETECTED   Staphylococcus species DETECTED (A) NOT DETECTED   Staphylococcus aureus NOT DETECTED NOT DETECTED   Methicillin resistance DETECTED (A) NOT DETECTED   Streptococcus species NOT DETECTED NOT DETECTED   Streptococcus agalactiae NOT DETECTED NOT DETECTED   Streptococcus pneumoniae NOT DETECTED NOT DETECTED   Streptococcus pyogenes NOT DETECTED NOT DETECTED   Acinetobacter baumannii NOT DETECTED NOT DETECTED   Enterobacteriaceae species NOT DETECTED NOT DETECTED   Enterobacter cloacae complex NOT DETECTED NOT DETECTED   Escherichia coli NOT DETECTED NOT DETECTED   Klebsiella oxytoca NOT DETECTED NOT DETECTED   Klebsiella pneumoniae NOT DETECTED NOT DETECTED   Proteus species NOT DETECTED NOT DETECTED   Serratia marcescens NOT DETECTED NOT DETECTED   Haemophilus influenzae NOT DETECTED NOT DETECTED   Neisseria meningitidis NOT DETECTED NOT DETECTED   Pseudomonas aeruginosa NOT DETECTED NOT DETECTED   Candida albicans NOT DETECTED NOT DETECTED   Candida glabrata NOT DETECTED NOT DETECTED   Candida krusei NOT DETECTED NOT DETECTED   Candida parapsilosis NOT DETECTED NOT DETECTED   Candida tropicalis NOT DETECTED NOT DETECTED    Name of physician (or Provider) Contacted: Dr Aileen Fass  Changes to prescribed antibiotics required: Keep as it for now. Plan to run by ID to determine whether contamination or true positive culture   Royetta Asal, PharmD, BCPS Pager [phone] 11/09/2016 1:52 PM

## 2016-11-09 NOTE — Consult Note (Signed)
Date of Admission:  11/02/2016  Date of Consult:  11/09/2016  Reason for Consult: Coag neg staph bacteremia in setting of PM Referring Physician: Dr Olevia Bowens   HPI: [name] is an 82 y.o. male w hx of emphysema, DM, ttpenia, PAF, sp PM for bradyarrhythmia, hepatic cancer sp hepatectomy now with recurrent hepatic cancer, FTT, Peg tube admitted with fevers, cough, sob, found to have evidence of PNA w sepsis admitted placed on vancomycin and zosyn. Admission blood cx negative. He was transioned to augmentin  He then became profoundly hypotensive on the 7th and repeat blood cxs done.  He apparently had infiltration of an IV during CT though ? After or before his hypotension. In any case his blood c x drom the 5th 2/2 coag neg staph, and same org growing on the cxs from the 9th, He is back on vancomycin.   Past Medical History:  Diagnosis Date  . Back pain   . Cancer ([place])   . COPD (chronic obstructive pulmonary disease) (HCC)    Gold C  . Coronary artery disease   . Dissecting aortic aneurysm, abdominal ([place]) 11/13/2011   Overview:  Severe aortoiliac stenosis with possible a chronic dissection versus  ulcerated plaque/thrombus extending above and below the aortic  bifurcation; without aneurysmal dilation  as per CT abdomen noted 09/25/2011   . DM2 (diabetes mellitus, type 2) ([place])    takes Tradjenta daily  . Dysphagia    s/p PEG placement  . Micro (hepatocellular carcinoma) ([place])    s/p right lobe resection 2012  . Hyperlipidemia   . Hypertension   . Hypothyroidism    takes Synthroid daily   . Pacemaker 11/11/2015   St. Jude single chamber pacemaker implanted at [facility] 11/2014.  Marland Kitchen Paroxysmal atrial flutter (HCC)    and fibrillation with known bradycardia, Chads2vasc of 4, not on A/C due to thrombocytopenia and functional status  . Peripheral vascular disease ([place])    LICA stent [id number]  . Pneumonia 11/03/2012   aspiration pneumonias  . Pneumothorax 10/2015  . Presence of  permanent cardiac pacemaker   . Severe malnutrition ([place])   . Shortness of breath dyspnea    with exertion   . Thrombocytopenia ([place])     Past Surgical History:  Procedure Laterality Date  . CAROTID ANGIOGRAM  2012  . CAROTID STENT  2002  . cataract surgery      bilateral  . COLONOSCOPY    . DOPPLER ECHOCARDIOGRAPHY  2011  . ESOPHAGOGASTRODUODENOSCOPY  10/01/2012   Procedure: ESOPHAGOGASTRODUODENOSCOPY (EGD);  Surgeon: Gwenyth Ober, MD;  Location: [place];  Service: General;  Laterality: N/A;  wyatt/leone  . ESOPHAGOGASTRODUODENOSCOPY  10/31/2012   Procedure: ESOPHAGOGASTRODUODENOSCOPY (EGD);  Surgeon: Beryle Beams, MD;  Location: [facility] ENDOSCOPY;  Service: Endoscopy;  Laterality: N/A;  . IR GENERIC HISTORICAL  07/25/2016   IR [place] DUODEN/JEJUNO TUBE PERCUT W/FLUORO 07/25/2016 Markus Daft, MD [facility]INTER[facility]  . IR GENERIC HISTORICAL  08/30/2016   IR [facility] TUBE CHANGE 08/30/2016 Corrie Mckusick, DO [facility]  . IR GENERIC HISTORICAL  09/04/2016   IR CM INJ ANY COLONIC TUBE W/FLUORO 09/04/2016 [facility]INTER[facility]  . IR GENERIC HISTORICAL  09/20/2016   IR CM INJ ANY COLONIC TUBE W/FLUORO 09/20/2016 Corrie Mckusick, DO [facility]  . IR GENERIC HISTORICAL  09/20/2016   IR CT SPINE LTD 09/20/2016 Corrie Mckusick, DO [facility]  . liver canc    . pacemaker inserted    . PARS PLANA VITRECTOMY  Right 11/14/2015   Procedure: PARS PLANA VITRECTOMY WITH 25 GAUGE, WITH ENDOLASER;  Surgeon: Hurman Horn, MD;  Location: [place];  Service: Ophthalmology;  Laterality: Right;  . PEG PLACEMENT  10/01/2012   Procedure: PERCUTANEOUS ENDOSCOPIC GASTROSTOMY (PEG) PLACEMENT;  Surgeon: Gwenyth Ober, MD;  Location: [place];  Service: General;  Laterality: N/A;  . PEG TUBE PLACEMENT  10/12/2015      replacment tube  . POSTERIOR CERVICAL FUSION/FORAMINOTOMY  09/25/2012   Procedure: POSTERIOR CERVICAL FUSION/FORAMINOTOMY LEVEL 2;  Surgeon: Eustace Moore, MD;  Location: [place] NEURO ORS;  Service: Neurosurgery;  Laterality: N/A;   Posterior Cervical three-four laminectomy, posterior cervical three-four, four-five fusion    Social History:  reports that he quit smoking about 5 years ago. His smoking use included Cigarettes. He has a 55.00 pack-year smoking history. He has never used smokeless tobacco. He reports that he does not drink alcohol or use drugs.   Family History  Problem Relation Age of Onset  . Cancer Father     throat, died young  . Liver disease Brother   . CAD Neg Hx     No Known Allergies   Medications: I have reviewed patients current medications as documented in Epic Anti-infectives    Start     Dose/Rate Route Frequency Ordered Stop   11/10/16 0600  vancomycin (VANCOCIN) 500 mg in sodium chloride 0.9 % 100 mL IVPB     500 mg 100 mL/hr over 60 Minutes Intravenous Every 12 hours 11/09/16 1441     11/09/16 1600  vancomycin (VANCOCIN) IVPB 1000 mg/200 mL premix     1,000 mg 200 mL/hr over 60 Minutes Intravenous  Once 11/09/16 1441     11/07/16 2200  vancomycin (VANCOCIN) IVPB 1000 mg/200 mL premix     1,000 mg 200 mL/hr over 60 Minutes Intravenous  Once 11/07/16 2031 11/07/16 2359   11/05/16 2000  amoxicillin-clavulanate (AUGMENTIN) 400-57 MG/5ML suspension 875 mg  Status:  Discontinued     875 mg Oral Every 12 hours 11/05/16 1433 11/05/16 1437   11/05/16 2000  amoxicillin-clavulanate (AUGMENTIN) 250-62.5 MG/5ML suspension 875 mg  Status:  Discontinued     875 mg Oral Every 12 hours 11/05/16 1440 11/09/16 1412   11/03/16 1300  vancomycin (VANCOCIN) 500 mg in sodium chloride 0.9 % 100 mL IVPB  Status:  Discontinued     500 mg 100 mL/hr over 60 Minutes Intravenous Every 12 hours 11/03/16 0023 11/05/16 1433   11/03/16 0900  piperacillin-tazobactam (ZOSYN) IVPB 3.375 g  Status:  Discontinued     3.375 g 12.5 mL/hr over 240 Minutes Intravenous Every 8 hours 11/03/16 0048 11/03/16 0153   11/03/16 0200  piperacillin-tazobactam (ZOSYN) IVPB 3.375 g  Status:  Discontinued     3.375 g 12.5 mL/hr  over 240 Minutes Intravenous Every 8 hours 11/03/16 0154 11/05/16 1433   11/03/16 0100  piperacillin-tazobactam (ZOSYN) IVPB 3.375 g  Status:  Discontinued     3.375 g 100 mL/hr over 30 Minutes Intravenous STAT 11/03/16 0046 11/03/16 0152   11/03/16 0030  ceFEPIme (MAXIPIME) 1 g in dextrose 5 % 50 mL IVPB  Status:  Discontinued     1 g 100 mL/hr over 30 Minutes Intravenous  Once 11/03/16 0011 11/03/16 0038   11/03/16 0030  vancomycin (VANCOCIN) IVPB 1000 mg/200 mL premix     1,000 mg 200 mL/hr over 60 Minutes Intravenous STAT 11/03/16 0016 11/03/16 0152         ROS:  as in HPI  otherwise remainder of 12 point Review of Systems is not obtainable due to patient's confusion   Blood pressure (!) 130/51, pulse 64, temperature 98.7 F (37.1 C), temperature source Axillary, resp. rate 15, height 5\' 9"  (1.753 m), weight 142 lb 3.2 oz (64.5 kg), SpO2 93 %. General: cachectic, w [place] in mouth, answers some questions . HEENT: anicteric sclera,  EOMI, oropharynx  Very dry Cardiovascular:irr, irr rate, normal r,  no murmur rubs or gallops Pulmonary: clear to auscultation bilaterally, no wheezing, rales or rhonchi Gastrointestinal: soft nontender, nondistended, normal bowel sounds, Musculoskeletal: no  clubbing or edema noted bilaterally Skin,   Very thin skin   PM site 10/1016:    Feet:    ? New  Lesions left foot    Left ankle     Ankles tender to palpation and pt w pain w removal of sock flexion df of foot  Hand 11/09/16: nail bed w ? Splinter hem       Neuro: nonfocal, strength and sensation intact   Results for orders placed or performed during the hospital encounter of 11/02/16 (from the past 48 hour(s))  Glucose, capillary     Status: Abnormal   Collection Time: 11/07/16  5:03 PM  Result Value Ref Range   Glucose-Capillary 231 (H) 65 - 99 mg/dL  Glucose, capillary     Status: Abnormal   Collection Time: 11/07/16 11:03 PM  Result Value Ref Range    Glucose-Capillary 216 (H) 65 - 99 mg/dL  Glucose, capillary     Status: Abnormal   Collection Time: 11/08/16  7:19 AM  Result Value Ref Range   Glucose-Capillary 111 (H) 65 - 99 mg/dL  Glucose, capillary     Status: Abnormal   Collection Time: 11/08/16 11:35 AM  Result Value Ref Range   Glucose-Capillary 111 (H) 65 - 99 mg/dL  Culture, blood (Routine X 2) w Reflex to ID Panel     Status: None (Preliminary result)   Collection Time: 11/08/16  1:24 PM  Result Value Ref Range   Specimen Description BLOOD LEFT ARM    Special Requests IN PEDIATRIC BOTTLE 2CC    Culture      NO GROWTH < 24 HOURS Performed at [facility]    Report Status PENDING   Culture, blood (Routine X 2) w Reflex to ID Panel     Status: None (Preliminary result)   Collection Time: 11/08/16  1:24 PM  Result Value Ref Range   Specimen Description BLOOD LEFT ARM    Special Requests IN PEDIATRIC BOTTLE 2CC    Culture  Setup Time      GRAM POSITIVE COCCI IN CLUSTERS AEROBIC BOTTLE ONLY Organism ID to follow CRITICAL RESULT CALLED TO, READ BACK BY AND VERIFIED WITH: D ZEIGLER 11/09/16 @ [place] Performed at [place]    Report Status PENDING   Blood Culture ID Panel (Reflexed)     Status: Abnormal   Collection Time: 11/08/16  1:24 PM  Result Value Ref Range   Enterococcus species NOT DETECTED NOT DETECTED   Listeria monocytogenes NOT DETECTED NOT DETECTED   Staphylococcus species DETECTED (A) NOT DETECTED    Comment: CRITICAL RESULT CALLED TO, READ BACK BY AND VERIFIED WITH: D ZEIGLER 11/09/16 @ 1331 M VESTAL    Staphylococcus aureus NOT DETECTED NOT DETECTED   Methicillin resistance DETECTED (A) NOT DETECTED    Comment: CRITICAL RESULT CALLED TO, READ BACK BY AND VERIFIED WITH: D  ZEIGLER 11/09/16 @ 1331 M VESTAL    Streptococcus species NOT DETECTED NOT DETECTED   Streptococcus agalactiae NOT DETECTED NOT DETECTED   Streptococcus pneumoniae NOT DETECTED  NOT DETECTED   Streptococcus pyogenes NOT DETECTED NOT DETECTED   Acinetobacter baumannii NOT DETECTED NOT DETECTED   Enterobacteriaceae species NOT DETECTED NOT DETECTED   Enterobacter cloacae complex NOT DETECTED NOT DETECTED   Escherichia coli NOT DETECTED NOT DETECTED   Klebsiella oxytoca NOT DETECTED NOT DETECTED   Klebsiella pneumoniae NOT DETECTED NOT DETECTED   Proteus species NOT DETECTED NOT DETECTED   Serratia marcescens NOT DETECTED NOT DETECTED   Haemophilus influenzae NOT DETECTED NOT DETECTED   Neisseria meningitidis NOT DETECTED NOT DETECTED   Pseudomonas aeruginosa NOT DETECTED NOT DETECTED   Candida albicans NOT DETECTED NOT DETECTED   Candida glabrata NOT DETECTED NOT DETECTED   Candida krusei NOT DETECTED NOT DETECTED   Candida parapsilosis NOT DETECTED NOT DETECTED   Candida tropicalis NOT DETECTED NOT DETECTED    Comment: Performed at [facility]  Glucose, capillary     Status: Abnormal   Collection Time: 11/08/16  4:59 PM  Result Value Ref Range   Glucose-Capillary 120 (H) 65 - 99 mg/dL  Glucose, capillary     Status: None   Collection Time: 11/08/16 11:17 PM  Result Value Ref Range   Glucose-Capillary 92 65 - 99 mg/dL  Glucose, capillary     Status: Abnormal   Collection Time: 11/09/16  7:40 AM  Result Value Ref Range   Glucose-Capillary 255 (H) 65 - 99 mg/dL  Glucose, capillary     Status: Abnormal   Collection Time: 11/09/16 11:53 AM  Result Value Ref Range   Glucose-Capillary 158 (H) 65 - 99 mg/dL   @BRIEFLABTABLE (sdes,specrequest,cult,reptstatus)   ) Recent Results (from the past 720 hour(s))  Culture, blood (routine x 2)     Status: None   Collection Time: 11/02/16 11:10 PM  Result Value Ref Range Status   Specimen Description BLOOD LEFT ANTECUBITAL  Final   Special Requests BOTTLES DRAWN AEROBIC AND ANAEROBIC 5ML  Final   Culture   Final    NO GROWTH 5 DAYS Performed at [facility]    Report Status 11/08/2016 FINAL  Final   Culture, blood (routine x 2)     Status: None   Collection Time: 11/02/16 11:30 PM  Result Value Ref Range Status   Specimen Description BLOOD BLOOD LEFT FOREARM  Final   Special Requests BOTTLES DRAWN AEROBIC AND ANAEROBIC 5ML EA  Final   Culture   Final    NO GROWTH 5 DAYS Performed at [facility]    Report Status 11/08/2016 FINAL  Final  Urine culture     Status: None   Collection Time: 11/03/16 12:44 AM  Result Value Ref Range Status   Specimen Description URINE, RANDOM  Final   Special Requests NONE  Final   Culture NO GROWTH Performed at [facility]   Final   Report Status 11/05/2016 FINAL  Final  Respiratory Panel by PCR     Status: None   Collection Time: 11/04/16 12:09 AM  Result Value Ref Range Status   Adenovirus NOT DETECTED NOT DETECTED Final   Coronavirus 229E NOT DETECTED NOT DETECTED Final   Coronavirus HKU1 NOT DETECTED NOT DETECTED Final   Coronavirus NL63 NOT DETECTED NOT DETECTED Final   Coronavirus OC43 NOT DETECTED NOT DETECTED Final   Metapneumovirus NOT DETECTED NOT DETECTED Final   Rhinovirus / Enterovirus  NOT DETECTED NOT DETECTED Final   Influenza A NOT DETECTED NOT DETECTED Final   Influenza B NOT DETECTED NOT DETECTED Final   Parainfluenza Virus 1 NOT DETECTED NOT DETECTED Final   Parainfluenza Virus 2 NOT DETECTED NOT DETECTED Final   Parainfluenza Virus 3 NOT DETECTED NOT DETECTED Final   Parainfluenza Virus 4 NOT DETECTED NOT DETECTED Final   Respiratory Syncytial Virus NOT DETECTED NOT DETECTED Final   Bordetella pertussis NOT DETECTED NOT DETECTED Final   Chlamydophila pneumoniae NOT DETECTED NOT DETECTED Final   Mycoplasma pneumoniae NOT DETECTED NOT DETECTED Final    Comment: Performed at [facility]  Culture, blood (Routine X 2) w Reflex to ID Panel     Status: Abnormal   Collection Time: 11/06/16  5:15 PM  Result Value Ref Range Status   Specimen Description BLOOD LEFT HAND  Final   Special Requests IN  PEDIATRIC BOTTLE .5CC  Final   Culture  Setup Time   Final    GRAM POSITIVE COCCI IN CLUSTERS AEROBIC BOTTLE ONLY CRITICAL RESULT CALLED TO, READ BACK BY AND VERIFIED WITHRichrd Sox PHARMD 2019 11/8/147 A BROWNING Performed at [facility]    Culture STAPHYLOCOCCUS SPECIES (COAGULASE NEGATIVE) (A)  Final   Report Status 11/09/2016 FINAL  Final   Organism ID, Bacteria STAPHYLOCOCCUS SPECIES (COAGULASE NEGATIVE)  Final      Susceptibility   Staphylococcus species (coagulase negative) - MIC*    CIPROFLOXACIN <=0.5 SENSITIVE Sensitive     ERYTHROMYCIN <=0.25 SENSITIVE Sensitive     GENTAMICIN <=0.5 SENSITIVE Sensitive     OXACILLIN <=0.25 SENSITIVE Sensitive     TETRACYCLINE <=1 SENSITIVE Sensitive     VANCOMYCIN <=0.5 SENSITIVE Sensitive     TRIMETH/SULFA <=10 SENSITIVE Sensitive     CLINDAMYCIN <=0.25 SENSITIVE Sensitive     RIFAMPIN <=0.5 SENSITIVE Sensitive     Inducible Clindamycin NEGATIVE Sensitive     * STAPHYLOCOCCUS SPECIES (COAGULASE NEGATIVE)  Culture, blood (Routine X 2) w Reflex to ID Panel     Status: Abnormal   Collection Time: 11/06/16  5:20 PM  Result Value Ref Range Status   Specimen Description BLOOD LEFT HAND  Final   Special Requests IN PEDIATRIC BOTTLE .5CC  Final   Culture  Setup Time   Final    GRAM POSITIVE COCCI IN CLUSTERS AEROBIC BOTTLE ONLY CRITICAL VALUE NOTED.  VALUE IS CONSISTENT WITH PREVIOUSLY REPORTED AND CALLED VALUE.    Culture (A)  Final    STAPHYLOCOCCUS SPECIES (COAGULASE NEGATIVE) SUSCEPTIBILITIES PERFORMED ON PREVIOUS CULTURE WITHIN THE LAST 5 DAYS. Performed at [facility]    Report Status 11/09/2016 FINAL  Final  Culture, blood (Routine X 2) w Reflex to ID Panel     Status: None (Preliminary result)   Collection Time: 11/08/16  1:24 PM  Result Value Ref Range Status   Specimen Description BLOOD LEFT ARM  Final   Special Requests IN PEDIATRIC BOTTLE 2CC  Final   Culture   Final    NO GROWTH < 24 HOURS Performed at  [facility]    Report Status PENDING  Incomplete  Culture, blood (Routine X 2) w Reflex to ID Panel     Status: None (Preliminary result)   Collection Time: 11/08/16  1:24 PM  Result Value Ref Range Status   Specimen Description BLOOD LEFT ARM  Final   Special Requests IN PEDIATRIC BOTTLE [place]  Final   Culture  Setup Time   Final    GRAM  POSITIVE COCCI IN CLUSTERS AEROBIC BOTTLE ONLY Organism ID to follow CRITICAL RESULT CALLED TO, READ BACK BY AND VERIFIED WITH: D ZEIGLER 11/09/16 @ [place] Performed at [place]  Final   Report Status PENDING  Incomplete  Blood Culture ID Panel (Reflexed)     Status: Abnormal   Collection Time: 11/08/16  1:24 PM  Result Value Ref Range Status   Enterococcus species NOT DETECTED NOT DETECTED Final   Listeria monocytogenes NOT DETECTED NOT DETECTED Final   Staphylococcus species DETECTED (A) NOT DETECTED Final    Comment: CRITICAL RESULT CALLED TO, READ BACK BY AND VERIFIED WITH: D ZEIGLER 11/09/16 @ 1331 M VESTAL    Staphylococcus aureus NOT DETECTED NOT DETECTED Final   Methicillin resistance DETECTED (A) NOT DETECTED Final    Comment: CRITICAL RESULT CALLED TO, READ BACK BY AND VERIFIED WITH: D ZEIGLER 11/09/16 @ 1331 M VESTAL    Streptococcus species NOT DETECTED NOT DETECTED Final   Streptococcus agalactiae NOT DETECTED NOT DETECTED Final   Streptococcus pneumoniae NOT DETECTED NOT DETECTED Final   Streptococcus pyogenes NOT DETECTED NOT DETECTED Final   Acinetobacter baumannii NOT DETECTED NOT DETECTED Final   Enterobacteriaceae species NOT DETECTED NOT DETECTED Final   Enterobacter cloacae complex NOT DETECTED NOT DETECTED Final   Escherichia coli NOT DETECTED NOT DETECTED Final   Klebsiella oxytoca NOT DETECTED NOT DETECTED Final   Klebsiella pneumoniae NOT DETECTED NOT DETECTED Final   Proteus species NOT DETECTED NOT DETECTED Final   Serratia marcescens NOT DETECTED NOT DETECTED  Final   Haemophilus influenzae NOT DETECTED NOT DETECTED Final   Neisseria meningitidis NOT DETECTED NOT DETECTED Final   Pseudomonas aeruginosa NOT DETECTED NOT DETECTED Final   Candida albicans NOT DETECTED NOT DETECTED Final   Candida glabrata NOT DETECTED NOT DETECTED Final   Candida krusei NOT DETECTED NOT DETECTED Final   Candida parapsilosis NOT DETECTED NOT DETECTED Final   Candida tropicalis NOT DETECTED NOT DETECTED Final    Comment: Performed at [facility]     Impression/Recommendation  Principal Problem:   Acute on chronic respiratory failure with hypoxia ([place]) Active Problems:   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 male with  mx medical problems single lead PM, coag neg staph bacteremia w sepsis that declared itself during this admission  #1Coag neg staph bacteremia:  This could have occurred due to infiltrated IV, it could be coming from infection in ankle and one could consider CT ankle but he would not be an operative candidate.  Regardless w ongoing bacteremia from the 7th through today (cx only recently turned + today)  I have concern PM could be infected  Restart IV vancomycin  Repeat blood cx tomorrow  Do not place PICC or central line until repeat cx no growth at 4 days  TTE  Consider TEE BUT pts daughter says due to steel rods in his neck this was deemed high risk prior to them having placed PM  I doubt he would be able  to survive PM extraction if he has severe bradycardia  I also worry about his ability to tolerate central line w  temp PM or even for IV abx  He and daughter are meeting w palliative care to discuss goals of care including potentially purely palliative one without abx  Some other options besides aggressive ones described above and presumptive rx for PM infection endocarditis w 6 weeks of IV vancomycin might include dose or long acting ORITAVANCIN w po abx vs po abx  I think the comp[assdionate thing ti do is to switch to pure palliative approach  #2 Aspiration pna: he is already 8 days of broad spectrum abx  I spent greater than 80 minutes with the patient including greater than 50% of time in face to face counsel of the patient's daughter re his coag neg staph bacteremia, poss PM infection, goals of care and in coordination of his care.    11/09/2016, 4:35 PM   Thank you so much for this interesting consult  [place] for [place] [phone] (pager) [phone] (office) 11/09/2016, 4:35 PM  [place] 11/09/2016, 4:35 PM

## 2016-11-09 NOTE — Progress Notes (Signed)
I went  to 6th floor and spoke with Sharyn Lull, patient's daughter, she stated that they were told her dada has an infection in his pacemaker ,and that needs to come out, and Dr.'s said they were just putting out fires one after the other and antibiotics won't help weeither", she stated they were going to stop any more procedures no radiation at this point stated she has to call her sister in another [place],she just wants to take him home and comfort  Quality  For him , he is just worn out", asked that I speak with Dr. Lisbeth Renshaw and cancel any further appts, listened  to the daughter and hugged hr and said I was so sorry, and would inform Dr. Lisbeth Renshaw. Dr. Lisbeth Renshaw said that if they change their minds we would see him back ,to close out his appts, informed Candace RT Therapist to close out his appts with rad /Onc .3:21 PM

## 2016-11-10 ENCOUNTER — Inpatient Hospital Stay (HOSPITAL_COMMUNITY): Payer: Medicare Other

## 2016-11-10 DIAGNOSIS — R7881 Bacteremia: Secondary | ICD-10-CM

## 2016-11-10 DIAGNOSIS — R1084 Generalized abdominal pain: Secondary | ICD-10-CM

## 2016-11-10 LAB — GLUCOSE, CAPILLARY
GLUCOSE-CAPILLARY: 153 mg/dL — AB (ref 65–99)
GLUCOSE-CAPILLARY: 98 mg/dL (ref 65–99)
GLUCOSE-CAPILLARY: 91 mg/dL (ref 65–99)

## 2016-11-10 LAB — ECHOCARDIOGRAM COMPLETE
HEIGHTINCHES: 69 in
Weight: 2190.49 oz

## 2016-11-10 MED ORDER — LORAZEPAM 2 MG/ML PO CONC: 0.5000 mg | ORAL | Status: DC | PRN

## 2016-11-10 MED ORDER — LORAZEPAM 2 MG/ML PO CONC: 0.5000 mg | ORAL | 0 refills | Status: AC | PRN

## 2016-11-10 MED ORDER — OXYCODONE HCL 5 MG PO TABS
5.0000 mg | ORAL_TABLET | ORAL | Status: DC | PRN
  Administered 2016-11-10 – 2016-11-11 (×3): 10 mg
  Filled 2016-11-10 (×3): qty 2

## 2016-11-10 MED ORDER — FREE WATER: 200.0000 mL | Freq: Three times a day (TID) | Status: AC

## 2016-11-10 MED ORDER — ATROPINE SULFATE 1 % OP SOLN
2.0000 [drp] | OPHTHALMIC | Status: DC | PRN
  Filled 2016-11-10: qty 2

## 2016-11-10 MED ORDER — HALOPERIDOL LACTATE 2 MG/ML PO CONC
1.0000 mg | ORAL | Status: DC | PRN
  Filled 2016-11-10: qty 0.5
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
## 2016-11-10 NOTE — Progress Notes (Signed)
[facility] PROGRESS NOTE    Progress Note  [name]  [id number] DOB: [birth year] DOA: 11/02/2016 PCP: Conni Slipper, NP    Brief Narrative:   [name] is an 82 y.o. male past medical history significant of essential hypertension diabetes mellitus type 2, hypothyroidism status post pacemaker atrial fibrillation not on anticoagulation, hepatocellular carcinoma status post right lower lobe resection in 2012, PEG tube on enteral feedings who presents with fever cough and shortness of breath to the emergency room.  Assessment/Plan:   Acute encephalopathy: Likely due to Infectious etiology, they did resolve then became confused again question of it was due to question if this is due to hypoactive delirium.  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 Cont  IV vancomycin. [facility] and possible device removal. Daughters had a hard time making decisions as she relates he keeps getting complication after complications, she is unsure whether to proceed with comfort care she will like to meet with hospice and palliative care and will like to discuss it with family before making any decisions. Palliative care physician to meet with family on 11.11.2017 about proceeding toward comfort care. Patient has Poor prognosis.  Acute on chronic respiratory failure with hypoxia (HCC) and mild sepsis due to aspiration pneumonia: He was still at significant risk of aspiration, patient decides to drink and knows risk and benefits and its willing to forego on a diet. Risk and benefits were explained.  Dysphagia: After review of his chart he has had significant risk of aspiration, patient interested in drinking at home despite knowing the risk. Cont. water per tube.  Hypomagnesemia: Repleted overly.    Thrombocytopathia ([place]) Plt's stable.  DM (diabetes mellitus) ([place]) type II: A1c 6.9 continue sliding scale insulin.  Hypothyroid Continue Synthroid.  Chronic atrial fibrillation: CHA2DS2-VASc Scoreis 4. Not candidate for anti-coagulation due to history of GI bleed. Heart rate well controlled.  Urinary retention: Foley catheter placed, will give a voiding trial.  Severe protein caloric malnutrition:  continue enteral feedings.  Acute confusional state/Acute hypoactive delirium due to multiple etiologies Palliative care started Zyprexa  Palliative care encounter Home with palliative  Chronic lower extremity pain: Resume home dose narcotics.   DVT prophylaxis: SCD Family Communication:daughter Disposition Plan/Barrier to D/C: home in 1-2 days Code Status:     Code Status Orders        Start     Ordered   11/03/16 1101  Do not attempt resuscitation (DNR)  Continuous    Question Answer Comment  In the event of cardiac or respiratory ARREST Do not call a "code blue"   In the event of cardiac or respiratory ARREST Do not perform Intubation, CPR, defibrillation or ACLS   In the event of cardiac or respiratory ARREST Use medication by any route, position, wound care, and other measures to relive pain and suffering. May use oxygen, suction and manual treatment of airway obstruction as needed for comfort.      11/03/16 1100    Code Status History    Date Active Date Inactive Code Status Order ID Comments User Context   11/03/2016 12:45 AM 11/03/2016 11:00 AM Full Code [id number]  Ivor Costa, MD ED   08/31/2016 11:50 PM 09/05/2016  9:14 PM DNR [id number]  Edwin Dada, MD Inpatient   10/10/2015  2:43 PM 10/13/2015  7:21 PM Full Code  [id number]  Melton Alar, PA-C Inpatient   09/10/2014  6:31 PM 09/12/2014  5:57 PM Full Code [id number]  Janece Canterbury, MD Inpatient   10/15/2013  6:19 AM 10/19/2013  4:45 PM Full Code [id number]  Orvan Falconer, MD Inpatient        IV  Access:    Peripheral IV   Procedures and diagnostic studies:   No results found.   Medical Consultants:    None.  Anti-Infectives:   Augmentin  Subjective:    [name] he relates his pain is controlled.  Objective:    Vitals:   11/09/16 1415 11/09/16 1838 11/09/16 2142 11/10/16 0514  BP: (!) 130/51  (!) 138/51 123/61  Pulse: 64  67 65  Resp: 15  16 16   Temp: 98.7 F (37.1 C)  98.4 F (36.9 C) 99.2 F (37.3 C)  TempSrc: Axillary  Axillary Axillary  SpO2: 93% 93% 95% 98%  Weight:    62.1 kg (136 lb 14.5 oz)  Height:        Intake/Output Summary (Last 24 hours) at 11/10/16 1041 Last data filed at 11/10/16 0836  Gross per 24 hour  Intake          2478.33 ml  Output              950 ml  Net          1528.33 ml   Filed Weights   11/08/16 0500 11/09/16 0403 11/10/16 0514  Weight: 54.5 kg (120 lb 2.4 oz) 64.5 kg (142 lb 3.2 oz) 62.1 kg (136 lb 14.5 oz)    Exam: General exam: In no acute distress.cachectic Respiratory system: Good air movement and clear to auscultation. Cardiovascular system: S1 & S2 heard, RRR.  Gastrointestinal system: Abdomen is nondistended, soft and nontender.  Extremities: No pedal edema. Skin: No rashes, lesions or ulcers Psychiatry: Judgement and insight appear normal. Mood & affect appropriate.    Data Reviewed:    Labs: Basic Metabolic Panel:  Recent Labs Lab 11/05/16 0354 11/06/16 0338 11/07/16 0402  NA 142 145 137  K 3.7 3.5 3.6  CL 113* 113* 104  CO2 22 24 27   GLUCOSE 234* 220* 254*  BUN 20 16 15   CREATININE 1.00 0.78 0.77  CALCIUM 8.0* 8.0* 7.9*  MG 1.9 1.7  --    GFR Estimated Creatinine Clearance: 61.5 mL/min (by C-G formula based on SCr of 0.77 mg/dL). Liver Function Tests:  Recent Labs Lab 11/05/16 0354 11/07/16 0402  AST 38 42*  ALT 16* 19  ALKPHOS 58 92  BILITOT 1.0 1.4*  PROT 5.1* 5.5*  ALBUMIN 2.4* 2.2*   No results for input(s): LIPASE, AMYLASE in the last 168 hours.  Recent  Labs Lab 11/06/16 1310  AMMONIA 46*   Coagulation profile No results for input(s): INR, PROTIME in the last 168 hours.  CBC:  Recent Labs Lab 11/05/16 0354 11/06/16 0338  WBC 4.6 6.6  NEUTROABS 3.4 5.0  HGB 11.0* 12.1*  HCT 33.6* 36.8*  MCV 100.6* 100.3*  PLT 76* 82*   Cardiac Enzymes: No results for input(s): CKTOTAL, CKMB, CKMBINDEX, TROPONINI in the last 168 hours. BNP (last 3 results) No results for input(s): PROBNP in the last 8760 hours. CBG:  Recent Labs Lab 11/09/16 0740 11/09/16 1153 11/09/16 1641 11/09/16 2139 11/10/16 0828  GLUCAP 255* 158* 106* 235* 153*   D-Dimer: No results for input(s): DDIMER in the last 72 hours. Hgb A1c: No results for input(s): HGBA1C in the last 72 hours.  Lipid Profile: No results for input(s): CHOL, HDL, LDLCALC, TRIG, CHOLHDL, LDLDIRECT in the last 72 hours. Thyroid function studies: No results for input(s): TSH, T4TOTAL, T3FREE, THYROIDAB in the last 72 hours.  Invalid input(s): FREET3 Anemia work up: No results for input(s): VITAMINB12, FOLATE, FERRITIN, TIBC, IRON, RETICCTPCT in the last 72 hours. Sepsis Labs:  Recent Labs Lab 11/05/16 0354 11/06/16 0338  WBC 4.6 6.6   Microbiology Recent Results (from the past 240 hour(s))  Culture, blood (routine x 2)     Status: None   Collection Time: 11/02/16 11:10 PM  Result Value Ref Range Status   Specimen Description BLOOD LEFT ANTECUBITAL  Final   Special Requests BOTTLES DRAWN AEROBIC AND ANAEROBIC 5ML  Final   Culture   Final    NO GROWTH 5 DAYS Performed at [facility]    Report Status 11/08/2016 FINAL  Final  Culture, blood (routine x 2)     Status: None   Collection Time: 11/02/16 11:30 PM  Result Value Ref Range Status   Specimen Description BLOOD BLOOD LEFT FOREARM  Final   Special Requests BOTTLES DRAWN AEROBIC AND ANAEROBIC 5ML EA  Final   Culture   Final    NO GROWTH 5 DAYS Performed at [facility]    Report Status 11/08/2016  FINAL  Final  Urine culture     Status: None   Collection Time: 11/03/16 12:44 AM  Result Value Ref Range Status   Specimen Description URINE, RANDOM  Final   Special Requests NONE  Final   Culture NO GROWTH Performed at [facility]   Final   Report Status 11/05/2016 FINAL  Final  Respiratory Panel by PCR     Status: None   Collection Time: 11/04/16 12:09 AM  Result Value Ref Range Status   Adenovirus NOT DETECTED NOT DETECTED Final   Coronavirus 229E NOT DETECTED NOT DETECTED Final   Coronavirus HKU1 NOT DETECTED NOT DETECTED Final   Coronavirus NL63 NOT DETECTED NOT DETECTED Final   Coronavirus OC43 NOT DETECTED NOT DETECTED Final   Metapneumovirus NOT DETECTED NOT DETECTED Final   Rhinovirus / Enterovirus NOT DETECTED NOT DETECTED Final   Influenza A NOT DETECTED NOT DETECTED Final   Influenza B NOT DETECTED NOT DETECTED Final   Parainfluenza Virus 1 NOT DETECTED NOT DETECTED Final   Parainfluenza Virus 2 NOT DETECTED NOT DETECTED Final   Parainfluenza Virus 3 NOT DETECTED NOT DETECTED Final   Parainfluenza Virus 4 NOT DETECTED NOT DETECTED Final   Respiratory Syncytial Virus NOT DETECTED NOT DETECTED Final   Bordetella pertussis NOT DETECTED NOT DETECTED Final   Chlamydophila pneumoniae NOT DETECTED NOT DETECTED Final   Mycoplasma pneumoniae NOT DETECTED NOT DETECTED Final    Comment: Performed at [facility]  Culture, blood (Routine X 2) w Reflex to ID Panel     Status: Abnormal   Collection Time: 11/06/16  5:15 PM  Result Value Ref Range Status   Specimen Description BLOOD LEFT HAND  Final   Special Requests IN PEDIATRIC BOTTLE .5CC  Final   Culture  Setup Time   Final    GRAM POSITIVE COCCI IN CLUSTERS AEROBIC BOTTLE ONLY CRITICAL RESULT CALLED TO, READ BACK BY AND VERIFIED WITHRichrd Sox PHARMD 2019 11/8/147 A BROWNING Performed at [facility]    Culture STAPHYLOCOCCUS SPECIES (COAGULASE NEGATIVE) (A)  Final   Report Status 11/09/2016 FINAL   Final   Organism ID, Bacteria STAPHYLOCOCCUS SPECIES (COAGULASE NEGATIVE)  Final  Susceptibility   Staphylococcus species (coagulase negative) - MIC*    CIPROFLOXACIN <=0.5 SENSITIVE Sensitive     ERYTHROMYCIN <=0.25 SENSITIVE Sensitive     GENTAMICIN <=0.5 SENSITIVE Sensitive     OXACILLIN <=0.25 SENSITIVE Sensitive     TETRACYCLINE <=1 SENSITIVE Sensitive     VANCOMYCIN <=0.5 SENSITIVE Sensitive     TRIMETH/SULFA <=10 SENSITIVE Sensitive     CLINDAMYCIN <=0.25 SENSITIVE Sensitive     RIFAMPIN <=0.5 SENSITIVE Sensitive     Inducible Clindamycin NEGATIVE Sensitive     * STAPHYLOCOCCUS SPECIES (COAGULASE NEGATIVE)  Culture, blood (Routine X 2) w Reflex to ID Panel     Status: Abnormal   Collection Time: 11/06/16  5:20 PM  Result Value Ref Range Status   Specimen Description BLOOD LEFT HAND  Final   Special Requests IN PEDIATRIC BOTTLE .5CC  Final   Culture  Setup Time   Final    GRAM POSITIVE COCCI IN CLUSTERS AEROBIC BOTTLE ONLY CRITICAL VALUE NOTED.  VALUE IS CONSISTENT WITH PREVIOUSLY REPORTED AND CALLED VALUE.    Culture (A)  Final    STAPHYLOCOCCUS SPECIES (COAGULASE NEGATIVE) SUSCEPTIBILITIES PERFORMED ON PREVIOUS CULTURE WITHIN THE LAST 5 DAYS. Performed at [facility]    Report Status 11/09/2016 FINAL  Final  Culture, blood (Routine X 2) w Reflex to ID Panel     Status: None (Preliminary result)   Collection Time: 11/08/16  1:24 PM  Result Value Ref Range Status   Specimen Description BLOOD LEFT ARM  Final   Special Requests IN PEDIATRIC BOTTLE 2CC  Final   Culture  Setup Time   Final    AEROBIC BOTTLE ONLY GRAM POSITIVE COCCI IN CLUSTERS CRITICAL VALUE NOTED.  VALUE IS CONSISTENT WITH PREVIOUSLY REPORTED AND CALLED VALUE.    Culture   Final    GRAM POSITIVE COCCI CULTURE REINCUBATED FOR BETTER GROWTH Performed at [facility]    Report Status PENDING  Incomplete  Culture, blood (Routine X 2) w Reflex to ID Panel     Status: Abnormal  (Preliminary result)   Collection Time: 11/08/16  1:24 PM  Result Value Ref Range Status   Specimen Description BLOOD LEFT ARM  Final   Special Requests IN PEDIATRIC BOTTLE 2CC  Final   Culture  Setup Time   Final    GRAM POSITIVE COCCI IN CLUSTERS AEROBIC BOTTLE ONLY Organism ID to follow CRITICAL RESULT CALLED TO, READ BACK BY AND VERIFIED WITH: D ZEIGLER 11/09/16 @ 1331 M VESTAL    Culture (A)  Final    STAPHYLOCOCCUS LUGDUNENSIS SUSCEPTIBILITIES TO FOLLOW Performed at [facility]    Report Status PENDING  Incomplete  Blood Culture ID Panel (Reflexed)     Status: Abnormal   Collection Time: 11/08/16  1:24 PM  Result Value Ref Range Status   Enterococcus species NOT DETECTED NOT DETECTED Final   Listeria monocytogenes NOT DETECTED NOT DETECTED Final   Staphylococcus species DETECTED (A) NOT DETECTED Final    Comment: CRITICAL RESULT CALLED TO, READ BACK BY AND VERIFIED WITH: D ZEIGLER 11/09/16 @ 1331 M VESTAL    Staphylococcus aureus NOT DETECTED NOT DETECTED Final   Methicillin resistance DETECTED (A) NOT DETECTED Final    Comment: CRITICAL RESULT CALLED TO, READ BACK BY AND VERIFIED WITH: D ZEIGLER 11/09/16 @ 1331 M VESTAL    Streptococcus species NOT DETECTED NOT DETECTED Final   Streptococcus agalactiae NOT DETECTED NOT DETECTED Final   Streptococcus pneumoniae NOT DETECTED NOT DETECTED Final  Streptococcus pyogenes NOT DETECTED NOT DETECTED Final   Acinetobacter baumannii NOT DETECTED NOT DETECTED Final   Enterobacteriaceae species NOT DETECTED NOT DETECTED Final   Enterobacter cloacae complex NOT DETECTED NOT DETECTED Final   Escherichia coli NOT DETECTED NOT DETECTED Final   Klebsiella oxytoca NOT DETECTED NOT DETECTED Final   Klebsiella pneumoniae NOT DETECTED NOT DETECTED Final   Proteus species NOT DETECTED NOT DETECTED Final   Serratia marcescens NOT DETECTED NOT DETECTED Final   Haemophilus influenzae NOT DETECTED NOT DETECTED Final   Neisseria  meningitidis NOT DETECTED NOT DETECTED Final   Pseudomonas aeruginosa NOT DETECTED NOT DETECTED Final   Candida albicans NOT DETECTED NOT DETECTED Final   Candida glabrata NOT DETECTED NOT DETECTED Final   Candida krusei NOT DETECTED NOT DETECTED Final   Candida parapsilosis NOT DETECTED NOT DETECTED Final   Candida tropicalis NOT DETECTED NOT DETECTED Final    Comment: Performed at [place]     Medications:   . aspirin  81 mg Per Tube QHS  . B-complex with vitamin C  1 tablet Oral Daily  . budesonide  0.25 mg Nebulization BID  . cyproheptadine  4 mg Per Tube QHS  . ergocalciferol  50,000 Units Oral Weekly  . feeding supplement (OSMOLITE 1.5 CAL)  1,000 mL Per Tube Q24H  . free water  200 mL Per Tube Q8H  . insulin aspart  0-5 Units Subcutaneous QHS  . insulin aspart  0-9 Units Subcutaneous TID WC  . ipratropium-albuterol  3 mL Nebulization BID  . levothyroxine  88 mcg Per Tube QAC breakfast  . OLANZapine  2.5 mg Oral QHS  . polyethylene glycol  8.5 g Per Tube Daily  . sennosides  10 mL Per Tube BID  . sodium chloride  1,000 mL Intravenous Once  . vancomycin  500 mg Intravenous Q12H   Continuous Infusions:   Time spent: 15 min   LOS: 7 days   Charlynne Cousins  [facility] Pager [phone]  *Please refer to [email or web address], password [email or web address] to get updated schedule on who will round on this patient, as hospitalists switch teams weekly. If 7PM-7AM, please contact night-coverage at [email or web address], password [email or web address] for any overnight needs.  11/10/2016, 10:41 AM

## 2016-11-10 NOTE — Care Management Note (Signed)
Case Management Note  Patient Details  Name: [name] MRN: [id number] Date of Birth: [birth year]  Subjective/Objective:    Acute on chronic respiratory failure with hypoxia,  Thrombocytopathia, DM, acute encephalopathy, acute bacterial endocarditis               Action/Plan: Discharge Planning: AVS reviewed: NCM spoke to pt and gave permission to speak to dtr, Linde Gillis. Spoke to dtr and offered choice for Home Hospice/provided list. Dtr states hospital bed is needed. Contacted [place], [place] with new referral.   PCP Conni Slipper MD  Expected Discharge Date:  11/11/2016             Expected Discharge Plan:  Home w Hospice Care  In-House Referral:  NA  Discharge planning Services  CM Consult  Post Acute Care Choice:  Hospice Choice offered to:  Adult Children  DME Arranged:  Hospital bed DME Agency:  [place]:  RN [facility] Agency:  [facility]  Status of Service:  Completed, signed off  If discussed at [place] of Stay Meetings, dates discussed:    Additional Comments:  Erenest Rasher, RN 11/10/2016, 1:37 PM

## 2016-11-10 NOTE — Progress Notes (Signed)
Echo done.

## 2016-11-10 NOTE — Progress Notes (Addendum)
[facility] Liaison Note  Notified by Jonnie Finner, [facility] of family request for Hospice and [place] services at home after discharge.  Chart and patient information currently under review to confirm hospice eligibility.  Spoke daughter [facility] by phone to confirm interest and explain services.  Sharyn Lull verbalized understanding of the information provided and shared that they have been told prognosis is very short.  Both RNCM Edwin Cap and daughter Sharyn Lull report plan is for discharge to home 11/11/2016 via [facility]. Michelle requested hospital bed with split rails, overbed table and oxygen setup.   DME was ordered from [facility]. An [place] representative has already contacted Sharyn Lull and plan is for DME to be delivered tomorrow morning.  Home address, pharmacy and preferred attending of record have been confirmed.   [facility] aware of the above.   Please send signed completed out of facility DNR home with patient.  Please send prescription for any medications patient does not already have including comfort medications.  Please fax final discharge summary to [facility] at [phone].  Please notify [facility] when patient is ready to leave unit at discharge-call [phone].   Plan to follow up with family at hospital in am.   Above information shared with Jonnie Finner, RNCM.  Please call with any questions.  Thank you, Erling Conte, LCSW [phone]

## 2016-11-10 NOTE — Discharge Summary (Signed)
Physician Discharge Summary  [name] [id number] DOB: 1931/02/02 DOA: 11/02/2016  PCP: Conni Slipper, NP  Admit date: 11/02/2016 Discharge date: 11/10/2016  Admitted From: home Disposition:  Home  Recommendations for Outpatient Follow-up:  1.   Home Health:yes Equipment/Devices: None  Discharge Condition:guarded CODE STATUS:DNR Diet recommendation: Comfort feeds  Brief/Interim Summary: [name] is an 82 y.o. male past medical history significant of essential hypertension diabetes mellitus type 2, hypothyroidism status post pacemaker atrial fibrillation not on anticoagulation, hepatocellular carcinoma status post right lower lobe resection in 2012, PEG tube on enteral feedings who presents with fever cough and shortness of breath to the emergency room.  Discharge Diagnoses:  Principal Problem:   Acute on chronic respiratory failure with hypoxia (HCC) Active Problems:   Thrombocytopathia ([place])   DM (diabetes mellitus) ([place])   Hypothyroid   Dysphagia, oropharyngeal phase   Copd Golds C   HCAP (healthcare-associated pneumonia)   Aspiration pneumonia (HCC)   Chronic atrial fibrillation (HCC)   Pacemaker   Sepsis ([place])   Confusion   Respiratory distress   Acute hypoactive delirium due to multiple etiologies   Palliative care encounter   Epigastric pain   Positive blood cultures   Pacemaker infection ([place])   Goals of care, counseling/discussion   Generalized abdominal pain  Acute encephalopathy: Likely due to Infectious etiology, this did resolve then became confused again question of it was due to question if this is due to hypoactive delirium.  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 Infectious disease and cardiology physician were consulted, ID recommended to TEE and possible removal of pacemaker.  Cont  IV vancomycin. Daughter and grandson have to having long discussion with palliative Care decided to move towards comfort care. They want all antibiotics and treatment stopped and they would like to make him comfortable. Electrical home with assistance.  Acute on chronic respiratory failure with hypoxia (HCC) and mild sepsis due to aspiration pneumonia: He was still at significant risk of aspiration, patient decides to drink and knows risk and benefits and its willing to forego on a diet. Risk and benefits were explained. He completed his course of antibiotics in house.  Dysphagia: After review of his chart he has had significant risk of aspiration, patient interested in drinking at home despite knowing the risk. Comfort feeds. Cont. water per tube.  Hypomagnesemia: Repleted  Thrombocytopathia (HCC) Plt's stable.  DM (diabetes mellitus) ([place]) type II: Stop diabetes medication.  Hypothyroid Stop Synthroid  Chronic atrial fibrillation: CHA2DS2-VASc Scoreis 4. Not candidate for anti-coagulation due to history of GI bleed. Heart rate well controlled.  Urinary retention: Foley catheter placed, will give a voiding trial.  Severe protein caloric malnutrition:  continue enteral feedings.  Acute confusional state/Acute hypoactive delirium due to multiple etiologies Palliative care started Zyprexa  Palliative care encounter Home with hospice.  Discharge Instructions  Discharge Instructions    Diet - low sodium heart healthy    Complete by:  As directed    Increase activity slowly    Complete by:  As directed        Medication List    STOP taking these medications   albuterol (2.5 MG/3ML) 0.083% nebulizer solution Commonly known as:  PROVENTIL   aspirin EC 81 MG tablet   cyproheptadine 4 MG tablet Commonly known as:  PERIACTIN   ergocalciferol 50000 units capsule Commonly known as:  VITAMIN D2  feeding supplement (OSMOLITE 1.5 CAL) Liqd    levothyroxine 88 MCG tablet Commonly known as:  SYNTHROID, LEVOTHROID   linagliptin 5 MG Tabs tablet Commonly known as:  TRADJENTA   polyethylene glycol packet Commonly known as:  MIRALAX / GLYCOLAX   tiotropium 18 MCG inhalation capsule Commonly known as:  SPIRIVA     TAKE these medications   budesonide 0.25 MG/2ML nebulizer solution Commonly known as:  PULMICORT Take 0.25 mg by nebulization every 6 (six) hours.   free water Soln Place 200 mLs into feeding tube every 8 (eight) hours.   LORazepam 2 MG/ML concentrated solution Commonly known as:  ATIVAN Place 0.3 mLs (0.6 mg total) into feeding tube every 4 (four) hours as needed for anxiety (or agitation).   oxyCODONE 15 MG immediate release tablet Commonly known as:  ROXICODONE Place 7.5-15 mg into feeding tube See admin instructions. Take 1/2 - 1 tablet by feeding tube twice daily (depending on pain level), may take an additional 1/2 -1 tablet at noon if needed for pain      Follow-up [facility] Follow up.   Why:  home health services Contact information: [place] [address] [phone]          No Known Allergies  Consultations:  ID     Procedures/Studies: Ct Abdomen Pelvis Wo Contrast  Result Date: 11/06/2016 CLINICAL DATA:  82 year old male presenting with fever, cough and dyspnea starting 11/05/2016. Fever to 104.2. EXAM: CT  CHEST, ABDOMEN AND PELVIS WITHOUT CONTRAST TECHNIQUE: Multidetector CT imaging of the chest, abdomen and pelvis was performed using the standard protocol without intravenous contrast. CT technologist notes that the patient was originally scheduled for a CTA of the chest, abdomen and pelvis however the IV infiltrated 57 cc of Isovue 370. Right arm was immediately elevated and ice applied to second injection. The patient's nurse and ordering clinician Dr. Jearld Shines were notified and the clinician saw the patient per report. Order  was then changed to a noncontrast exam. Safety zone was filed. COMPARISON:  09/05/2016 CT abdomen, CT abdomen and pelvis from 09/02/2016 can't 11/02/2016 chest radiograph FINDINGS: CT CHEST FINDINGS Cardiovascular: The heart is mildly enlarged. There is coronary arteriosclerosis and calcifications at the aortic root. Single pacer lead is seen extending into the right ventricle. Atherosclerosis of the thoracic aorta without aneurysm. Slight dilatation of the main pulmonary artery age of 12 mm. Mediastinum/Nodes: Precarinal lymph node measuring up to 13 mm is identified short axis. 11 mm AP window lymph node short axis. No paratracheal lymphadenopathy. Lungs/Pleura: Moderate bilateral pleural effusions with compressive atelectasis. Superimposed upon centrilobular emphysema are areas of patchy airspace opacities involving the right upper lobe, right middle lobe and right lower lobe. Lesser degree of involvement seen on the left adjacent to the left heart border within the left upper lobe and lingula. Musculoskeletal: Extravasated contrast medium noted along the visualized right upper extremity to base of neck on the right. No osteolytic or osteoblastic disease within the chest. 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 No prevertebral soft tissue swelling is seen. This may represent discontinuous calcification as opposed to posttraumatic deformity. CT ABDOMEN AND PELVIS FINDINGS Hepatobiliary: The patient is status post right hepatectomy. There is a 28 x 21 mm hypodensity in the left hepatic lobe series 2, image 75 previously estimated at 29 x 25 mm. More cephalad and medial on series 2, image 70 is a  21 x 18 mm hypodensity versus 25 x 20 mm previously. It should be noted that these are incompletely characterized and assessed due to lack of significant contrast bolus due to above-described contrast extravasation. No ductal dilatation is  noted. Pancreas: Atrophic without ductal dilatation or mass. Spleen: Normal size.  No focal lesions. Adrenals/Urinary Tract: The left adrenal gland is slightly bulbous along its distal limbs. Right adrenal gland is unremarkable. There is a stable hypodensity the upper pole of the left kidney measuring approximately 15 mm and consistent with a cyst. This is stable. Urinary opacification of the renal collecting systems and bladder from what IV contrast was received prior extravasation. Foley catheter noted within the bladder. No obstructive uropathy. Stomach/Bowel: Feeding gastrojejunostomy tube with tip in the proximal jejunum seen within the left lower quadrant. No bowel obstruction. Enteric contrast reaches distal transverse colon. There appears to be segmental thickening above distal transverse and proximal descending colon with minimal pericolonic inflammation suspicious for colitis. No bowel obstruction or free air. 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 No pathologically enlarged appearing lymph nodes. Reproductive: Coarse calcifications along the periphery of the right prostate gland. Normal size prostate. Other: Small amount of free fluid in the pelvis.  Mild anasarca. Musculoskeletal: Stable mild L2 superior endplate wedge compression. No suspicious osteoblastic or lytic disease. IMPRESSION: Development of moderate bilateral pleural effusions with adjacent compressive atelectasis. Superimposed on centrilobular emphysema are patchy airspace opacities right greater than left suspicious pneumonia. Reactive mediastinal lymphadenopathy is also suspected. Right hepatectomy with 2 hypodense left hepatic lobe lesions that appears slightly smaller than recent comparison. It should be noted that these are suboptimally characterized due to lack of significant IV contrast bolus due to extravasation of contrast.  Mild segmental thickening of distal transverse and proximal descending colon with mild pericolonic inflammation. A colitis is not entirely excluded. 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 Stable superior endplate fracture L2. 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 Electronically Signed   By: Ashley Royalty M.D.   On: 11/06/2016 21:13   Ct Head Wo Contrast  Result Date: 11/06/2016 CLINICAL DATA:  Confusion. EXAM: CT HEAD WITHOUT CONTRAST TECHNIQUE: Contiguous axial images were obtained from the base of the skull through the vertex without intravenous contrast. COMPARISON:  09/25/2012 FINDINGS: Brain: There is no evidence of acute cortical infarct, intracranial hemorrhage, mass, midline shift, or extra-axial fluid collection. Cerebral atrophy is unchanged. Periventricular white matter hypodensities are unchanged and nonspecific but compatible with minimal chronic small vessel ischemic disease. Vascular: No hyperdense vessel. Calcified atherosclerosis at the skullbase. Skull: No fracture or suspicious osseous lesion. Sinuses/Orbits: Minimal right maxillary sinus mucosal thickening, incompletely visualized. Trace mastoid effusions. Prior bilateral cataract extraction. Other: None. IMPRESSION: No evidence of acute intracranial abnormality. Electronically Signed   By: Logan Bores M.D.   On: 11/06/2016 20:39   Ct Chest Wo Contrast  Result Date: 11/06/2016 CLINICAL DATA:  82 year old male presenting with fever, cough and dyspnea starting 11/05/2016. Fever to 104.2. EXAM: CT  CHEST, ABDOMEN AND PELVIS WITHOUT CONTRAST TECHNIQUE: Multidetector CT imaging of the chest, abdomen and pelvis was performed using the standard protocol without intravenous contrast. CT technologist notes that the patient was originally scheduled for a CTA of the chest, abdomen and pelvis however the IV infiltrated 57 cc of Isovue 370. Right arm was immediately elevated and ice  applied to second injection. The patient's nurse and ordering clinician  Dr. Jearld Shines were notified and the clinician saw the patient per report. Order was then changed to a noncontrast exam. Safety zone was filed. COMPARISON:  09/05/2016 CT abdomen, CT abdomen and pelvis from 09/02/2016 can't 11/02/2016 chest radiograph FINDINGS: CT CHEST FINDINGS Cardiovascular: The heart is mildly enlarged. There is coronary arteriosclerosis and calcifications at the aortic root. Single pacer lead is seen extending into the right ventricle. Atherosclerosis of the thoracic aorta without aneurysm. Slight dilatation of the main pulmonary artery age of 49 mm. Mediastinum/Nodes: Precarinal lymph node measuring up to 13 mm is identified short axis. 11 mm AP window lymph node short axis. No paratracheal lymphadenopathy. Lungs/Pleura: Moderate bilateral pleural effusions with compressive atelectasis. Superimposed upon centrilobular emphysema are areas of patchy airspace opacities involving the right upper lobe, right middle lobe and right lower lobe. Lesser degree of involvement seen on the left adjacent to the left heart border within the left upper lobe and lingula. Musculoskeletal: Extravasated contrast medium noted along the visualized right upper extremity to base of neck on the right. No osteolytic or osteoblastic disease within the chest. 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 No prevertebral soft tissue swelling is seen. This may represent discontinuous calcification as opposed to posttraumatic deformity. CT ABDOMEN AND PELVIS FINDINGS Hepatobiliary: The patient is status post right hepatectomy. There is a 28 x 21 mm hypodensity in the left hepatic lobe series 2, image 75 previously estimated at 29 x 25 mm. More cephalad and medial on series 2, image 70 is a 21 x 18 mm hypodensity versus 25 x 20 mm previously. It should be noted that these are  incompletely characterized and assessed due to lack of significant contrast bolus due to above-described contrast extravasation. No ductal dilatation is noted. Pancreas: Atrophic without ductal dilatation or mass. Spleen: Normal size.  No focal lesions. Adrenals/Urinary Tract: The left adrenal gland is slightly bulbous along its distal limbs. Right adrenal gland is unremarkable. There is a stable hypodensity the upper pole of the left kidney measuring approximately 15 mm and consistent with a cyst. This is stable. Urinary opacification of the renal collecting systems and bladder from what IV contrast was received prior extravasation. Foley catheter noted within the bladder. No obstructive uropathy. Stomach/Bowel: Feeding gastrojejunostomy tube with tip in the proximal jejunum seen within the left lower quadrant. No bowel obstruction. Enteric contrast reaches distal transverse colon. There appears to be segmental thickening above distal transverse and proximal descending colon with minimal pericolonic inflammation suspicious for colitis. No bowel obstruction or free air. 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 No pathologically enlarged appearing lymph nodes. Reproductive: Coarse calcifications along the periphery of the right prostate gland. Normal size prostate. Other: Small amount of free fluid in the pelvis.  Mild anasarca. Musculoskeletal: Stable mild L2 superior endplate wedge compression. No suspicious osteoblastic or lytic disease. IMPRESSION: Development of moderate bilateral pleural effusions with adjacent compressive atelectasis. Superimposed on centrilobular emphysema are patchy airspace opacities right greater than left suspicious pneumonia. Reactive mediastinal lymphadenopathy is also suspected. Right hepatectomy with 2 hypodense left hepatic lobe lesions that appears slightly smaller than recent  comparison. It should be noted that these are suboptimally characterized due to lack of significant IV contrast bolus due to extravasation of contrast. Mild segmental thickening of distal transverse and proximal descending colon with mild pericolonic inflammation. A colitis is not entirely excluded. Stable chronic dissection  of the distal abdominal aorta. Stable superior endplate fracture L2. 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 Electronically Signed   By: Ashley Royalty M.D.   On: 11/06/2016 21:13   Dg Abd Acute W/chest  Result Date: 11/03/2016 CLINICAL DATA:  Fever and nausea today. EXAM: DG ABDOMEN ACUTE W/ 1V CHEST COMPARISON:  10/31/2015, 09/05/2016. FINDINGS: Airspace consolidation in the left lower lobe is increased from recent studies and may represent pneumonia. There also is a small left pleural effusion. The right lung is clear. The transvenous cardiac pacing lead appears intact. Pulmonary vasculature is normal. Percutaneous feeding gastrojejunostomy appears satisfactorily positioned. Abdominal gas pattern is negative for obstruction or perforation. IMPRESSION: 1. Left lower lobe airspace consolidation, suspicious for pneumonia. Probable small left effusion. 2. Negative for bowel obstruction or perforation. 3. Percutaneous gastrojejunostomy appears satisfactorily positioned. Electronically Signed   By: Andreas Newport M.D.   On: 11/03/2016 00:05   Dg Swallowing Func-speech Pathology  Result Date: 11/03/2016 Objective Swallowing Evaluation: Type of Study: MBS-Modified Barium Swallow Study Patient Details Name: YANIV EPPERT MRN: [id number] Date of Birth: 02/07/1931 Today's Date: 11/03/2016 Time: SLP Start Time (ACUTE ONLY): 1245-SLP Stop Time (ACUTE ONLY): 1325 SLP Time Calculation (min) (ACUTE ONLY): 40 min Past Medical History: Past Medical History: Diagnosis Date . Back pain  . Cancer ([place])  . COPD (chronic obstructive pulmonary disease) (HCC)    Gold C . Coronary artery disease  . Dissecting aortic aneurysm, abdominal ([place]) 11/13/2011  Overview:  Severe aortoiliac stenosis with possible a chronic dissection versus  ulcerated plaque/thrombus extending above and below the aortic  bifurcation; without aneurysmal dilation  as per CT abdomen noted 09/25/2011  . DM2 (diabetes mellitus, type 2) ([place])   takes Tradjenta daily . Dysphagia   s/p PEG placement . Cesar Chavez (hepatocellular carcinoma) ([place])   s/p right lobe resection 2012 . Hyperlipidemia  . Hypertension  . Hypothyroidism   takes Synthroid daily  . Pacemaker 11/11/2015  St. Jude single chamber pacemaker implanted at [facility] 11/2014. Marland Kitchen Paroxysmal atrial flutter (HCC)   and fibrillation with known bradycardia, Chads2vasc of 4, not on A/C due to thrombocytopenia and functional status . Peripheral vascular disease ([place])   LICA stent [id number] . Pneumonia 11/03/2012  aspiration pneumonias . Pneumothorax 10/2015 . Presence of permanent cardiac pacemaker  . Severe malnutrition ([place])  . Shortness of breath dyspnea   with exertion  . Thrombocytopenia ([employer])  Past Surgical History: Past Surgical History: Procedure Laterality Date . CAROTID ANGIOGRAM  2012 . CAROTID STENT  2002 . cataract surgery     bilateral . COLONOSCOPY   . DOPPLER ECHOCARDIOGRAPHY  2011 . ESOPHAGOGASTRODUODENOSCOPY  10/01/2012  Procedure: ESOPHAGOGASTRODUODENOSCOPY (EGD);  Surgeon: Gwenyth Ober, MD;  Location: [facility];  Service: General;  Laterality: N/A;  wyatt/leone . ESOPHAGOGASTRODUODENOSCOPY  10/31/2012  Procedure: ESOPHAGOGASTRODUODENOSCOPY (EGD);  Surgeon: Beryle Beams, MD;  Location: [facility] ENDOSCOPY;  Service: Endoscopy;  Laterality: N/A; . IR GENERIC HISTORICAL  07/25/2016  IR Velma DUODEN/JEJUNO TUBE PERCUT W/FLUORO 07/25/2016 Markus Daft, MD [facility] . IR GENERIC HISTORICAL  08/30/2016  IR GJ TUBE CHANGE 08/30/2016 Corrie Mckusick, DO [facility] . IR GENERIC HISTORICAL  09/04/2016  IR CM INJ ANY COLONIC TUBE W/FLUORO 09/04/2016 [facility] . IR  GENERIC HISTORICAL  09/20/2016  IR CM INJ ANY COLONIC TUBE W/FLUORO 09/20/2016 Corrie Mckusick, DO [facility] . IR GENERIC HISTORICAL  09/20/2016  IR CT SPINE LTD 09/20/2016 Corrie Mckusick, DO [facility] . liver canc   .  pacemaker inserted   . PARS PLANA VITRECTOMY Right 11/14/2015  Procedure: PARS PLANA VITRECTOMY WITH 25 GAUGE, WITH ENDOLASER;  Surgeon: Hurman Horn, MD;  Location: [facility];  Service: Ophthalmology;  Laterality: Right; . PEG PLACEMENT  10/01/2012  Procedure: PERCUTANEOUS ENDOSCOPIC GASTROSTOMY (PEG) PLACEMENT;  Surgeon: Gwenyth Ober, MD;  Location: [facility];  Service: General;  Laterality: N/A; . PEG TUBE PLACEMENT  10/12/2015     replacment tube . POSTERIOR CERVICAL FUSION/FORAMINOTOMY  09/25/2012  Procedure: POSTERIOR CERVICAL FUSION/FORAMINOTOMY LEVEL 2;  Surgeon: Eustace Moore, MD;  Location: [facility] NEURO OR[facility];  Service: Neurosurgery;  Laterality: N/A;  Posterior Cervical three-four laminectomy, posterior cervical three-four, four-five fusion HPI: 82 yo male with complex medical history including COPD, severe dysphagia s/p PEG - PEG just replaced yesterday at [facility], pt was febrile at home with ? vomiting.  admitted with fever, concern for pna.  Swallow evaluation ordered.  Pt has had several MBS and speech treatments/evaluations and has been on a diet at home despite aspiration risk.  Pt reports continued desire to eat.  Will plan MBS given last test in 2015 for pt and family education- Md, pt and family aware and agreeable.    Subjective: pt awake in chair Assessment / Plan / Recommendation CHL IP CLINICAL IMPRESSIONS 11/03/2016 Therapy Diagnosis Moderate oral phase dysphagia;Severe pharyngeal phase dysphagia Clinical Impression Patient presents with mild oral and severe pharyngeal dysphagia with sensorimotor deficits.  Lingual pumping and poor oral organization results in delayed transiting and piece-mealing.  Pharyngeal swallow characterized by poor epiglottis deflection/laryngeal elevation  resulting in severe pharyngeal residuals and constant laryngeal penetration.   Pt independently conducts extended breath-hold with double swallows to aid pharyngeal clearance.  Also, throat clearing helpful to reduce laryngeal penetration - as larynx never fully clears.  In addition, he frequently propelled residuals from tongue base/vallecular space into oral cavity and either re=swallowed or expectorated.  Increased viscosity of boluses caused much more residuals which was precarious as barium pudding lodged in pharynx close to airway without patient immediate attempts to clear.   He frequently expectorated throughout evaluation - which does effectively decrease pharyngeal residuals by up to 90%!  Pt admits he does this expectoration frequently at home with intake.  Using live video, reinforced these effective compensation strategies to patient for maximal airway protection.  Of note, patient did report he ate a gravy/sausage biscuit a few days ago and admits his coughed with it.  He does report that he "coughs with everything".  Pt reports agreement to consumption of liquids with MD approves with strict precautions and use of feeding tube for nutritional support.  Unfortunately patient's daughter Sharyn Lull did not attend MBS though SLP invited her earlier this am.  SLP to follow up.  Thanks for allowing me to help care for this patient.   Impact on safety and function --   CHL IP TREATMENT RECOMMENDATION 11/03/2016 Treatment Recommendations Therapy as outlined in treatment plan below   Prognosis 11/03/2016 Prognosis for Safe Diet Advancement Guarded Barriers to Reach Goals Severity of deficits;Time post onset Barriers/Prognosis Comment -- CHL IP DIET RECOMMENDATION 11/03/2016 SLP Diet Recommendations (No Data) Liquid Administration via No straw;Cup;Spoon Medication Administration Via alternative means Compensations Small sips/bites;Slow rate;Multiple dry swallows after each bite/sip;Hard cough after swallow;Other  (Comment) Postural Changes Seated upright at 90 degrees;Remain semi-upright after after feeds/meals (Comment)   CHL IP OTHER RECOMMENDATIONS 11/03/2016 Recommended Consults -- Oral Care Recommendations Oral care QID Other Recommendations Have oral suction available   CHL IP FOLLOW  UP RECOMMENDATIONS 10/12/2015 Follow up Recommendations None   CHL IP FREQUENCY AND DURATION 11/03/2016 Speech Therapy Frequency (ACUTE ONLY) min 1 x/week Treatment Duration 1 week      CHL IP ORAL PHASE 11/03/2016 Oral Phase Impaired Oral - Pudding Teaspoon -- Oral - Pudding Cup -- Oral - Honey Teaspoon -- Oral - Honey Cup -- Oral - Nectar Teaspoon Weak lingual manipulation;Lingual pumping;Delayed oral transit;Piecemeal swallowing;Decreased bolus cohesion Oral - Nectar Cup Weak lingual manipulation;Lingual pumping;Delayed oral transit;Piecemeal swallowing;Decreased bolus cohesion Oral - Nectar Straw -- Oral - Thin Teaspoon Weak lingual manipulation;Lingual pumping;Reduced posterior propulsion;Lingual/palatal residue;Piecemeal swallowing;Delayed oral transit;Decreased bolus cohesion Oral - Thin Cup Weak lingual manipulation;Lingual pumping;Reduced posterior propulsion;Lingual/palatal residue;Piecemeal swallowing;Delayed oral transit;Premature spillage Oral - Thin Straw -- Oral - Puree Weak lingual manipulation;Lingual pumping;Reduced posterior propulsion;Delayed oral transit Oral - Mech Soft -- Oral - Regular -- Oral - Multi-Consistency -- Oral - Pill -- Oral Phase - Comment --  CHL IP PHARYNGEAL PHASE 11/03/2016 Pharyngeal Phase Impaired Pharyngeal- Pudding Teaspoon -- Pharyngeal -- Pharyngeal- Pudding Cup -- Pharyngeal -- Pharyngeal- Honey Teaspoon -- Pharyngeal -- Pharyngeal- Honey Cup -- Pharyngeal -- Pharyngeal- Nectar Teaspoon Reduced pharyngeal peristalsis;Reduced epiglottic inversion;Reduced laryngeal elevation;Reduced airway/laryngeal closure;Reduced tongue base retraction;Penetration/Aspiration during swallow;Pharyngeal residue -  pyriform;Pharyngeal residue - posterior pharnyx Pharyngeal Material enters airway, remains ABOVE vocal cords and not ejected out Pharyngeal- Nectar Cup Reduced pharyngeal peristalsis;Reduced epiglottic inversion;Reduced laryngeal elevation;Reduced airway/laryngeal closure;Reduced tongue base retraction;Penetration/Aspiration during swallow;Pharyngeal residue - pyriform;Pharyngeal residue - valleculae Pharyngeal Material enters airway, remains ABOVE vocal cords and not ejected out Pharyngeal- Nectar Straw -- Pharyngeal -- Pharyngeal- Thin Teaspoon Reduced laryngeal elevation;Reduced airway/laryngeal closure;Reduced tongue base retraction;Penetration/Aspiration during swallow;Reduced pharyngeal peristalsis;Reduced epiglottic inversion;Pharyngeal residue - valleculae;Pharyngeal residue - pyriform Pharyngeal Material enters airway, remains ABOVE vocal cords and not ejected out Pharyngeal- Thin Cup Reduced pharyngeal peristalsis;Delayed swallow initiation-pyriform sinuses;Reduced epiglottic inversion;Reduced laryngeal elevation;Reduced airway/laryngeal closure;Reduced tongue base retraction;Penetration/Aspiration during swallow;Pharyngeal residue - pyriform;Pharyngeal residue - valleculae Pharyngeal Material enters airway, CONTACTS cords and not ejected out;Material enters airway, remains ABOVE vocal cords and not ejected out Pharyngeal- Thin Straw -- Pharyngeal -- Pharyngeal- Puree Delayed swallow initiation-vallecula;Reduced pharyngeal peristalsis;Reduced epiglottic inversion;Reduced airway/laryngeal closure;Reduced laryngeal elevation;Reduced tongue base retraction;Penetration/Aspiration during swallow;Pharyngeal residue - valleculae;Pharyngeal residue - posterior pharnyx Pharyngeal Material enters airway, remains ABOVE vocal cords then ejected out Pharyngeal- Mechanical Soft -- Pharyngeal -- Pharyngeal- Regular -- Pharyngeal -- Pharyngeal- Multi-consistency -- Pharyngeal -- Pharyngeal- Pill -- Pharyngeal --  Pharyngeal Comment pt conducting extended breath hold during double swallows followed by cough/expectoration effective to swallow barium into esophagus, remove laryngeal penetrates and expel pharyngeal barium residuals; pt with much improved clearance of liquids vs solids, excessive expectoration required and fortunately pt able to conduct at this time    CHL IP CERVICAL ESOPHAGEAL PHASE 11/03/2016 Cervical Esophageal Phase Impaired Pudding Teaspoon -- Pudding Cup -- Honey Teaspoon -- Honey Cup -- Nectar Teaspoon Prominent cricopharyngeal segment Nectar Cup Prominent cricopharyngeal segment Nectar Straw -- Thin Teaspoon Prominent cricopharyngeal segment Thin Cup Prominent cricopharyngeal segment Thin Straw -- Puree Prominent cricopharyngeal segment Mechanical Soft -- Regular -- Multi-consistency -- Pill -- Cervical Esophageal Comment -- CHL IP GO 10/12/2015 Functional Assessment Tool Used skilled clinical judgement Functional Limitations Swallowing Swallow Current Status [id number]) CM Swallow Goal Status [id number]) CM Swallow Discharge Status [id number]) CM Motor Speech Current Status [id number]) (None) Motor Speech Goal Status [id number]) (None) Motor Speech Goal Status [id number]) (None) Spoken Language Comprehension Current Status [id number]) (None) Spoken Language Comprehension Goal Status [id number]) (None) Spoken Language Comprehension Discharge Status [id number]) (None)  Spoken Language Expression Current Status [phone]) (None) Spoken Language Expression Goal Status [phone]) (None) Spoken Language Expression Discharge Status [phone]) (None) Attention Current Status [phone]) (None) Attention Goal Status [id number]) (None) Attention Discharge Status [phone]) (None) Memory Current Status [id number]) (None) Memory Goal Status [id number]) (None) Memory Discharge Status [id number]) (None) Voice Current Status [id number]) (None) Voice Goal Status [id number]) (None) Voice Discharge Status [id number]) (None) Other Speech-Language Pathology Functional Limitation [phone]) (None) Other  Speech-Language Pathology Functional Limitation Goal Status [id number]) (None) Other Speech-Language Pathology Functional Limitation Discharge Status [phone]) (None) Luanna Salk, MS [facility] SLP [phone]                 Subjective: Patient in bed with no new complains. Patient not able to come participating conversation.  Discharge Exam: Vitals:   11/09/16 2142 11/10/16 0514  BP: (!) 138/51 123/61  Pulse: 67 65  Resp: 16 16  Temp: 98.4 F (36.9 C) 99.2 F (37.3 C)   Vitals:   11/09/16 2142 11/10/16 0514 11/10/16 1204 11/10/16 1205  BP: (!) 138/51 123/61    Pulse: 67 65    Resp: 16 16    Temp: 98.4 F (36.9 C) 99.2 F (37.3 C)    TempSrc: Axillary Axillary    SpO2: 95% 98% 98% 98%  Weight:  62.1 kg (136 lb 14.5 oz)    Height:        General: Pt is alert, awake, not in acute distress Cardiovascular: RRR, S1/S2 +, no rubs, no gallops Respiratory: CTA bilaterally, no wheezing, no rhonchi Abdominal: Soft, NT, ND, bowel sounds + Extremities: no edema, no cyanosis    The results of significant diagnostics from this hospitalization (including imaging, microbiology, ancillary and laboratory) are listed below for reference.     Microbiology: Recent Results (from the past 240 hour(s))  Culture, blood (routine x 2)     Status: None   Collection Time: 11/02/16 11:10 PM  Result Value Ref Range Status   Specimen Description BLOOD LEFT ANTECUBITAL  Final   Special Requests BOTTLES DRAWN AEROBIC AND ANAEROBIC 5ML  Final   Culture   Final    NO GROWTH 5 DAYS Performed at [facility]    Report Status 11/08/2016 FINAL  Final  Culture, blood (routine x 2)     Status: None   Collection Time: 11/02/16 11:30 PM  Result Value Ref Range Status   Specimen Description BLOOD BLOOD LEFT FOREARM  Final   Special Requests BOTTLES DRAWN AEROBIC AND ANAEROBIC 5ML EA  Final   Culture   Final    NO GROWTH 5 DAYS Performed at [facility]    Report Status 11/08/2016 FINAL  Final   Urine culture     Status: None   Collection Time: 11/03/16 12:44 AM  Result Value Ref Range Status   Specimen Description URINE, RANDOM  Final   Special Requests NONE  Final   Culture NO GROWTH Performed at [facility]   Final   Report Status 11/05/2016 FINAL  Final  Respiratory Panel by PCR     Status: None   Collection Time: 11/04/16 12:09 AM  Result Value Ref Range Status   Adenovirus NOT DETECTED NOT DETECTED Final   Coronavirus 229E NOT DETECTED NOT DETECTED Final   Coronavirus HKU1 NOT DETECTED NOT DETECTED Final   Coronavirus NL63 NOT DETECTED NOT DETECTED Final   Coronavirus OC43 NOT DETECTED NOT DETECTED Final   Metapneumovirus NOT DETECTED NOT DETECTED Final   Rhinovirus / Enterovirus NOT DETECTED NOT DETECTED  Final   Influenza A NOT DETECTED NOT DETECTED Final   Influenza B NOT DETECTED NOT DETECTED Final   Parainfluenza Virus 1 NOT DETECTED NOT DETECTED Final   Parainfluenza Virus 2 NOT DETECTED NOT DETECTED Final   Parainfluenza Virus 3 NOT DETECTED NOT DETECTED Final   Parainfluenza Virus 4 NOT DETECTED NOT DETECTED Final   Respiratory Syncytial Virus NOT DETECTED NOT DETECTED Final   Bordetella pertussis NOT DETECTED NOT DETECTED Final   Chlamydophila pneumoniae NOT DETECTED NOT DETECTED Final   Mycoplasma pneumoniae NOT DETECTED NOT DETECTED Final    Comment: Performed at [facility]  Culture, blood (Routine X 2) w Reflex to ID Panel     Status: Abnormal   Collection Time: 11/06/16  5:15 PM  Result Value Ref Range Status   Specimen Description BLOOD LEFT HAND  Final   Special Requests IN PEDIATRIC BOTTLE .5CC  Final   Culture  Setup Time   Final    GRAM POSITIVE COCCI IN CLUSTERS AEROBIC BOTTLE ONLY CRITICAL RESULT CALLED TO, READ BACK BY AND VERIFIED WITHRichrd Sox PHARMD 2019 11/8/147 A BROWNING Performed at [facility]    Culture STAPHYLOCOCCUS SPECIES (COAGULASE NEGATIVE) (A)  Final   Report Status 11/09/2016 FINAL  Final    Organism ID, Bacteria STAPHYLOCOCCUS SPECIES (COAGULASE NEGATIVE)  Final      Susceptibility   Staphylococcus species (coagulase negative) - MIC*    CIPROFLOXACIN <=0.5 SENSITIVE Sensitive     ERYTHROMYCIN <=0.25 SENSITIVE Sensitive     GENTAMICIN <=0.5 SENSITIVE Sensitive     OXACILLIN <=0.25 SENSITIVE Sensitive     TETRACYCLINE <=1 SENSITIVE Sensitive     VANCOMYCIN <=0.5 SENSITIVE Sensitive     TRIMETH/SULFA <=10 SENSITIVE Sensitive     CLINDAMYCIN <=0.25 SENSITIVE Sensitive     RIFAMPIN <=0.5 SENSITIVE Sensitive     Inducible Clindamycin NEGATIVE Sensitive     * STAPHYLOCOCCUS SPECIES (COAGULASE NEGATIVE)  Culture, blood (Routine X 2) w Reflex to ID Panel     Status: Abnormal   Collection Time: 11/06/16  5:20 PM  Result Value Ref Range Status   Specimen Description BLOOD LEFT HAND  Final   Special Requests IN PEDIATRIC BOTTLE .5CC  Final   Culture  Setup Time   Final    GRAM POSITIVE COCCI IN CLUSTERS AEROBIC BOTTLE ONLY CRITICAL VALUE NOTED.  VALUE IS CONSISTENT WITH PREVIOUSLY REPORTED AND CALLED VALUE.    Culture (A)  Final    STAPHYLOCOCCUS SPECIES (COAGULASE NEGATIVE) SUSCEPTIBILITIES PERFORMED ON PREVIOUS CULTURE WITHIN THE LAST 5 DAYS. Performed at [facility]    Report Status 11/09/2016 FINAL  Final  Culture, blood (Routine X 2) w Reflex to ID Panel     Status: None (Preliminary result)   Collection Time: 11/08/16  1:24 PM  Result Value Ref Range Status   Specimen Description BLOOD LEFT ARM  Final   Special Requests IN PEDIATRIC BOTTLE 2CC  Final   Culture  Setup Time   Final    AEROBIC BOTTLE ONLY GRAM POSITIVE COCCI IN CLUSTERS CRITICAL VALUE NOTED.  VALUE IS CONSISTENT WITH PREVIOUSLY REPORTED AND CALLED VALUE.    Culture   Final    GRAM POSITIVE COCCI CULTURE REINCUBATED FOR BETTER GROWTH Performed at [facility]    Report Status PENDING  Incomplete  Culture, blood (Routine X 2) w Reflex to ID Panel     Status: Abnormal (Preliminary  result)   Collection Time: 11/08/16  1:24 PM  Result Value Ref Range  Status   Specimen Description BLOOD LEFT ARM  Final   Special Requests IN PEDIATRIC BOTTLE 2CC  Final   Culture  Setup Time   Final    GRAM POSITIVE COCCI IN CLUSTERS AEROBIC BOTTLE ONLY Organism ID to follow CRITICAL RESULT CALLED TO, READ BACK BY AND VERIFIED WITH: D ZEIGLER 11/09/16 @ 1331 M VESTAL    Culture (A)  Final    STAPHYLOCOCCUS LUGDUNENSIS SUSCEPTIBILITIES TO FOLLOW Performed at [facility]    Report Status PENDING  Incomplete  Blood Culture ID Panel (Reflexed)     Status: Abnormal   Collection Time: 11/08/16  1:24 PM  Result Value Ref Range Status   Enterococcus species NOT DETECTED NOT DETECTED Final   Listeria monocytogenes NOT DETECTED NOT DETECTED Final   Staphylococcus species DETECTED (A) NOT DETECTED Final    Comment: CRITICAL RESULT CALLED TO, READ BACK BY AND VERIFIED WITH: D ZEIGLER 11/09/16 @ 1331 M VESTAL    Staphylococcus aureus NOT DETECTED NOT DETECTED Final   Methicillin resistance DETECTED (A) NOT DETECTED Final    Comment: CRITICAL RESULT CALLED TO, READ BACK BY AND VERIFIED WITH: D ZEIGLER 11/09/16 @ 1331 M VESTAL    Streptococcus species NOT DETECTED NOT DETECTED Final   Streptococcus agalactiae NOT DETECTED NOT DETECTED Final   Streptococcus pneumoniae NOT DETECTED NOT DETECTED Final   Streptococcus pyogenes NOT DETECTED NOT DETECTED Final   Acinetobacter baumannii NOT DETECTED NOT DETECTED Final   Enterobacteriaceae species NOT DETECTED NOT DETECTED Final   Enterobacter cloacae complex NOT DETECTED NOT DETECTED Final   Escherichia coli NOT DETECTED NOT DETECTED Final   Klebsiella oxytoca NOT DETECTED NOT DETECTED Final   Klebsiella pneumoniae NOT DETECTED NOT DETECTED Final   Proteus species NOT DETECTED NOT DETECTED Final   Serratia marcescens NOT DETECTED NOT DETECTED Final   Haemophilus influenzae NOT DETECTED NOT DETECTED Final   Neisseria meningitidis NOT  DETECTED NOT DETECTED Final   Pseudomonas aeruginosa NOT DETECTED NOT DETECTED Final   Candida albicans NOT DETECTED NOT DETECTED Final   Candida glabrata NOT DETECTED NOT DETECTED Final   Candida krusei NOT DETECTED NOT DETECTED Final   Candida parapsilosis NOT DETECTED NOT DETECTED Final   Candida tropicalis NOT DETECTED NOT DETECTED Final    Comment: Performed at [facility]: BNP (last 3 results) No results for input(s): BNP in the last 8760 hours. Basic Metabolic Panel:  Recent Labs Lab 11/05/16 0354 11/06/16 0338 11/07/16 0402  NA 142 145 137  K 3.7 3.5 3.6  CL 113* 113* 104  CO2 22 24 27   GLUCOSE 234* 220* 254*  BUN 20 16 15   CREATININE 1.00 0.78 0.77  CALCIUM 8.0* 8.0* 7.9*  MG 1.9 1.7  --    Liver Function Tests:  Recent Labs Lab 11/05/16 0354 11/07/16 0402  AST 38 42*  ALT 16* 19  ALKPHOS 58 92  BILITOT 1.0 1.4*  PROT 5.1* 5.5*  ALBUMIN 2.4* 2.2*   No results for input(s): LIPASE, AMYLASE in the last 168 hours.  Recent Labs Lab 11/06/16 1310  AMMONIA 46*   CBC:  Recent Labs Lab 11/05/16 0354 11/06/16 0338  WBC 4.6 6.6  NEUTROABS 3.4 5.0  HGB 11.0* 12.1*  HCT 33.6* 36.8*  MCV 100.6* 100.3*  PLT 76* 82*   Cardiac Enzymes: No results for input(s): CKTOTAL, CKMB, CKMBINDEX, TROPONINI in the last 168 hours. BNP: Invalid input(s): POCBNP CBG:  Recent Labs Lab 11/09/16 1153 11/09/16 1641 11/09/16 2139  11/10/16 0828 11/10/16 1236  GLUCAP 158* 106* 235* 153* 98   D-Dimer No results for input(s): DDIMER in the last 72 hours. Hgb A1c No results for input(s): HGBA1C in the last 72 hours. Lipid Profile No results for input(s): CHOL, HDL, LDLCALC, TRIG, CHOLHDL, LDLDIRECT in the last 72 hours. Thyroid function studies No results for input(s): TSH, T4TOTAL, T3FREE, THYROIDAB in the last 72 hours.  Invalid input(s): FREET3 Anemia work up No results for input(s): VITAMINB12, FOLATE, FERRITIN, TIBC, IRON, RETICCTPCT in  the last 72 hours. Urinalysis    Component Value Date/Time   COLORURINE AMBER (A) 11/03/2016 0044   APPEARANCEUR TURBID (A) 11/03/2016 0044   LABSPEC 1.029 11/03/2016 0044   PHURINE 6.0 11/03/2016 0044   GLUCOSEU >1000 (A) 11/03/2016 0044   HGBUR NEGATIVE 11/03/2016 0044   BILIRUBINUR NEGATIVE 11/03/2016 0044   KETONESUR NEGATIVE 11/03/2016 0044   PROTEINUR NEGATIVE 11/03/2016 0044   UROBILINOGEN 1.0 04/01/2015 1701   NITRITE NEGATIVE 11/03/2016 0044   LEUKOCYTESUR NEGATIVE 11/03/2016 0044   Sepsis Labs Invalid input(s): PROCALCITONIN,  WBC,  LACTICIDVEN Microbiology Recent Results (from the past 240 hour(s))  Culture, blood (routine x 2)     Status: None   Collection Time: 11/02/16 11:10 PM  Result Value Ref Range Status   Specimen Description BLOOD LEFT ANTECUBITAL  Final   Special Requests BOTTLES DRAWN AEROBIC AND ANAEROBIC 5ML  Final   Culture   Final    NO GROWTH 5 DAYS Performed at [facility]    Report Status 11/08/2016 FINAL  Final  Culture, blood (routine x 2)     Status: None   Collection Time: 11/02/16 11:30 PM  Result Value Ref Range Status   Specimen Description BLOOD BLOOD LEFT FOREARM  Final   Special Requests BOTTLES DRAWN AEROBIC AND ANAEROBIC 5ML EA  Final   Culture   Final    NO GROWTH 5 DAYS Performed at [facility]    Report Status 11/08/2016 FINAL  Final  Urine culture     Status: None   Collection Time: 11/03/16 12:44 AM  Result Value Ref Range Status   Specimen Description URINE, RANDOM  Final   Special Requests NONE  Final   Culture NO GROWTH Performed at [facility]   Final   Report Status 11/05/2016 FINAL  Final  Respiratory Panel by PCR     Status: None   Collection Time: 11/04/16 12:09 AM  Result Value Ref Range Status   Adenovirus NOT DETECTED NOT DETECTED Final   Coronavirus 229E NOT DETECTED NOT DETECTED Final   Coronavirus HKU1 NOT DETECTED NOT DETECTED Final   Coronavirus NL63 NOT DETECTED NOT DETECTED  Final   Coronavirus OC43 NOT DETECTED NOT DETECTED Final   Metapneumovirus NOT DETECTED NOT DETECTED Final   Rhinovirus / Enterovirus NOT DETECTED NOT DETECTED Final   Influenza A NOT DETECTED NOT DETECTED Final   Influenza B NOT DETECTED NOT DETECTED Final   Parainfluenza Virus 1 NOT DETECTED NOT DETECTED Final   Parainfluenza Virus 2 NOT DETECTED NOT DETECTED Final   Parainfluenza Virus 3 NOT DETECTED NOT DETECTED Final   Parainfluenza Virus 4 NOT DETECTED NOT DETECTED Final   Respiratory Syncytial Virus NOT DETECTED NOT DETECTED Final   Bordetella pertussis NOT DETECTED NOT DETECTED Final   Chlamydophila pneumoniae NOT DETECTED NOT DETECTED Final   Mycoplasma pneumoniae NOT DETECTED NOT DETECTED Final    Comment: Performed at [facility]  Culture, blood (Routine X 2) w Reflex to ID  Panel     Status: Abnormal   Collection Time: 11/06/16  5:15 PM  Result Value Ref Range Status   Specimen Description BLOOD LEFT HAND  Final   Special Requests IN PEDIATRIC BOTTLE .5CC  Final   Culture  Setup Time   Final    GRAM POSITIVE COCCI IN CLUSTERS AEROBIC BOTTLE ONLY CRITICAL RESULT CALLED TO, READ BACK BY AND VERIFIED WITHRichrd Sox PHARMD 2019 11/8/147 A BROWNING Performed at [facility]    Culture STAPHYLOCOCCUS SPECIES (COAGULASE NEGATIVE) (A)  Final   Report Status 11/09/2016 FINAL  Final   Organism ID, Bacteria STAPHYLOCOCCUS SPECIES (COAGULASE NEGATIVE)  Final      Susceptibility   Staphylococcus species (coagulase negative) - MIC*    CIPROFLOXACIN <=0.5 SENSITIVE Sensitive     ERYTHROMYCIN <=0.25 SENSITIVE Sensitive     GENTAMICIN <=0.5 SENSITIVE Sensitive     OXACILLIN <=0.25 SENSITIVE Sensitive     TETRACYCLINE <=1 SENSITIVE Sensitive     VANCOMYCIN <=0.5 SENSITIVE Sensitive     TRIMETH/SULFA <=10 SENSITIVE Sensitive     CLINDAMYCIN <=0.25 SENSITIVE Sensitive     RIFAMPIN <=0.5 SENSITIVE Sensitive     Inducible Clindamycin NEGATIVE Sensitive     *  STAPHYLOCOCCUS SPECIES (COAGULASE NEGATIVE)  Culture, blood (Routine X 2) w Reflex to ID Panel     Status: Abnormal   Collection Time: 11/06/16  5:20 PM  Result Value Ref Range Status   Specimen Description BLOOD LEFT HAND  Final   Special Requests IN PEDIATRIC BOTTLE .5CC  Final   Culture  Setup Time   Final    GRAM POSITIVE COCCI IN CLUSTERS AEROBIC BOTTLE ONLY CRITICAL VALUE NOTED.  VALUE IS CONSISTENT WITH PREVIOUSLY REPORTED AND CALLED VALUE.    Culture (A)  Final    STAPHYLOCOCCUS SPECIES (COAGULASE NEGATIVE) SUSCEPTIBILITIES PERFORMED ON PREVIOUS CULTURE WITHIN THE LAST 5 DAYS. Performed at [facility]    Report Status 11/09/2016 FINAL  Final  Culture, blood (Routine X 2) w Reflex to ID Panel     Status: None (Preliminary result)   Collection Time: 11/08/16  1:24 PM  Result Value Ref Range Status   Specimen Description BLOOD LEFT ARM  Final   Special Requests IN PEDIATRIC BOTTLE 2CC  Final   Culture  Setup Time   Final    AEROBIC BOTTLE ONLY GRAM POSITIVE COCCI IN CLUSTERS CRITICAL VALUE NOTED.  VALUE IS CONSISTENT WITH PREVIOUSLY REPORTED AND CALLED VALUE.    Culture   Final    GRAM POSITIVE COCCI CULTURE REINCUBATED FOR BETTER GROWTH Performed at [facility]    Report Status PENDING  Incomplete  Culture, blood (Routine X 2) w Reflex to ID Panel     Status: Abnormal (Preliminary result)   Collection Time: 11/08/16  1:24 PM  Result Value Ref Range Status   Specimen Description BLOOD LEFT ARM  Final   Special Requests IN PEDIATRIC BOTTLE 2CC  Final   Culture  Setup Time   Final    GRAM POSITIVE COCCI IN CLUSTERS AEROBIC BOTTLE ONLY Organism ID to follow CRITICAL RESULT CALLED TO, READ BACK BY AND VERIFIED WITH: D ZEIGLER 11/09/16 @ 55 M VESTAL    Culture (A)  Final    STAPHYLOCOCCUS LUGDUNENSIS SUSCEPTIBILITIES TO FOLLOW Performed at [facility]    Report Status PENDING  Incomplete  Blood Culture ID Panel (Reflexed)     Status:  Abnormal   Collection Time: 11/08/16  1:24 PM  Result Value Ref Range Status  Enterococcus species NOT DETECTED NOT DETECTED Final   Listeria monocytogenes NOT DETECTED NOT DETECTED Final   Staphylococcus species DETECTED (A) NOT DETECTED Final    Comment: CRITICAL RESULT CALLED TO, READ BACK BY AND VERIFIED WITH: D ZEIGLER 11/09/16 @ 1331 M VESTAL    Staphylococcus aureus NOT DETECTED NOT DETECTED Final   Methicillin resistance DETECTED (A) NOT DETECTED Final    Comment: CRITICAL RESULT CALLED TO, READ BACK BY AND VERIFIED WITH: D ZEIGLER 11/09/16 @ 1331 M VESTAL    Streptococcus species NOT DETECTED NOT DETECTED Final   Streptococcus agalactiae NOT DETECTED NOT DETECTED Final   Streptococcus pneumoniae NOT DETECTED NOT DETECTED Final   Streptococcus pyogenes NOT DETECTED NOT DETECTED Final   Acinetobacter baumannii NOT DETECTED NOT DETECTED Final   Enterobacteriaceae species NOT DETECTED NOT DETECTED Final   Enterobacter cloacae complex NOT DETECTED NOT DETECTED Final   Escherichia coli NOT DETECTED NOT DETECTED Final   Klebsiella oxytoca NOT DETECTED NOT DETECTED Final   Klebsiella pneumoniae NOT DETECTED NOT DETECTED Final   Proteus species NOT DETECTED NOT DETECTED Final   Serratia marcescens NOT DETECTED NOT DETECTED Final   Haemophilus influenzae NOT DETECTED NOT DETECTED Final   Neisseria meningitidis NOT DETECTED NOT DETECTED Final   Pseudomonas aeruginosa NOT DETECTED NOT DETECTED Final   Candida albicans NOT DETECTED NOT DETECTED Final   Candida glabrata NOT DETECTED NOT DETECTED Final   Candida krusei NOT DETECTED NOT DETECTED Final   Candida parapsilosis NOT DETECTED NOT DETECTED Final   Candida tropicalis NOT DETECTED NOT DETECTED Final    Comment: Performed at [facility]     Time coordinating discharge: Over 30 minutes  SIGNED:   Charlynne Cousins, MD  [facility] 11/10/2016, 1:08 PM Pager   If 7PM-7AM, please contact  night-coverage [email or web address] Password [email or web address]

## 2016-11-11 DIAGNOSIS — L8990 Pressure ulcer of unspecified site, unspecified stage: Secondary | ICD-10-CM | POA: Insufficient documentation

## 2016-11-11 LAB — CULTURE, BLOOD (ROUTINE X 2)

## 2016-11-11 NOTE — Progress Notes (Signed)
[facility] PROGRESS NOTE    Progress Note  DEONTREZ SLAVENS  [id number] DOB: 28-Mar-1931 DOA: 11/02/2016 PCP: Conni Slipper, NP    Brief Narrative:   KYVEN LUTTON is an 82 y.o. male past medical history significant of essential hypertension diabetes mellitus type 2, hypothyroidism status post pacemaker atrial fibrillation not on anticoagulation, hepatocellular carcinoma status post right lower lobe resection in 2012, PEG tube on enteral feedings who presents with fever cough and shortness of breath to the emergency room.  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 She is awaiting equipment delivery at home today discharge as he had move towards hospice care at home.  Acute on chronic respiratory failure with hypoxia (HCC) and mild sepsis due to aspiration pneumonia: Dysphagia: Hypomagnesemia: Thrombocytopathia (HCC) DM (diabetes mellitus) ([place]) type II: Hypothyroid Chronic atrial fibrillation: Urinary retention: Acute confusional state/Acute hypoactive delirium due to multiple etiologies Palliative care encounter     DVT prophylaxis: SCD Family Communication:daughter Disposition Plan/Barrier to D/C: home today Code Status:     Code Status Orders        Start     Ordered   11/03/16 1101  Do not attempt resuscitation (DNR)  Continuous    Question Answer Comment  In the event of cardiac or respiratory ARREST Do not call a "code blue"   In the event of cardiac or respiratory ARREST Do not perform Intubation, CPR, defibrillation or ACLS   In the event of cardiac or respiratory ARREST Use medication by any route, position, wound care, and other measures to relive pain and suffering. May use oxygen, suction and manual treatment of airway obstruction as needed for comfort.      11/03/16 1100    Code Status History    Date Active Date Inactive Code Status Order ID Comments User Context   11/03/2016 12:45 AM 11/03/2016 11:00 AM Full Code [id number]  Ivor Costa, MD ED   08/31/2016 11:50 PM 09/05/2016  9:14 PM DNR [id number]  Edwin Dada, MD Inpatient   10/10/2015  2:43 PM 10/13/2015  7:21 PM Full Code [id number]  Melton Alar, PA-C Inpatient   09/10/2014  6:31 PM 09/12/2014  5:57 PM Full Code [id number]  Janece Canterbury, MD Inpatient   10/15/2013  6:19 AM 10/19/2013  4:45 PM Full Code [id number]  Orvan Falconer, MD Inpatient        IV Access:    Peripheral IV   Procedures and diagnostic studies:   No results found.   Medical Consultants:    None.  Anti-Infectives:   Augmentin  Subjective:    Florina Ou comfortable at bedside  Objective:    Vitals:   11/10/16 1205 11/10/16 1445 11/10/16 2050 11/11/16 0438  BP:  (!) 156/63 137/87 (!) 119/59  Pulse:  73 64 82  Resp:  17 20 16   Temp:  98.8 F (37.1 C) 99.6 F (37.6 C) 98.6 F (37 C)  TempSrc:  Oral Oral Oral  SpO2: 98% 98% 93% 92%  Weight:      Height:        Intake/Output Summary (Last 24 hours) at 11/11/16 1043 Last data filed at 11/11/16 0835  Gross per 24 hour  Intake                0 ml  Output  750 ml  Net             -750 ml   Filed Weights   11/08/16 0500 11/09/16 0403 11/10/16 0514  Weight: 54.5 kg (120 lb 2.4 oz) 64.5 kg (142 lb 3.2 oz) 62.1 kg (136 lb 14.5 oz)    Exam: General exam: In no acute distress.cachectic Respiratory system: Good air movement and clear to auscultation. Cardiovascular system: S1 & S2 heard, RRR.    Data Reviewed:    Labs: Basic Metabolic Panel:  Recent Labs Lab 11/05/16 0354 11/06/16 0338 11/07/16 0402  NA 142 145 137  K 3.7 3.5 3.6  CL 113* 113* 104  CO2 22 24 27   GLUCOSE 234* 220* 254*  BUN 20 16 15   CREATININE 1.00 0.78 0.77  CALCIUM 8.0* 8.0* 7.9*  MG 1.9 1.7  --    GFR Estimated Creatinine Clearance:  61.5 mL/min (by C-G formula based on SCr of 0.77 mg/dL). Liver Function Tests:  Recent Labs Lab 11/05/16 0354 11/07/16 0402  AST 38 42*  ALT 16* 19  ALKPHOS 58 92  BILITOT 1.0 1.4*  PROT 5.1* 5.5*  ALBUMIN 2.4* 2.2*   No results for input(s): LIPASE, AMYLASE in the last 168 hours.  Recent Labs Lab 11/06/16 1310  AMMONIA 46*   Coagulation profile No results for input(s): INR, PROTIME in the last 168 hours.  CBC:  Recent Labs Lab 11/05/16 0354 11/06/16 0338  WBC 4.6 6.6  NEUTROABS 3.4 5.0  HGB 11.0* 12.1*  HCT 33.6* 36.8*  MCV 100.6* 100.3*  PLT 76* 82*   Cardiac Enzymes: No results for input(s): CKTOTAL, CKMB, CKMBINDEX, TROPONINI in the last 168 hours. BNP (last 3 results) No results for input(s): PROBNP in the last 8760 hours. CBG:  Recent Labs Lab 11/09/16 1641 11/09/16 2139 11/10/16 0828 11/10/16 1236 11/10/16 1819  GLUCAP 106* 235* 153* 98 91   D-Dimer: No results for input(s): DDIMER in the last 72 hours. Hgb A1c: No results for input(s): HGBA1C in the last 72 hours. Lipid Profile: No results for input(s): CHOL, HDL, LDLCALC, TRIG, CHOLHDL, LDLDIRECT in the last 72 hours. Thyroid function studies: No results for input(s): TSH, T4TOTAL, T3FREE, THYROIDAB in the last 72 hours.  Invalid input(s): FREET3 Anemia work up: No results for input(s): VITAMINB12, FOLATE, FERRITIN, TIBC, IRON, RETICCTPCT in the last 72 hours. Sepsis Labs:  Recent Labs Lab 11/05/16 0354 11/06/16 0338  WBC 4.6 6.6   Microbiology Recent Results (from the past 240 hour(s))  Culture, blood (routine x 2)     Status: None   Collection Time: 11/02/16 11:10 PM  Result Value Ref Range Status   Specimen Description BLOOD LEFT ANTECUBITAL  Final   Special Requests BOTTLES DRAWN AEROBIC AND ANAEROBIC 5ML  Final   Culture   Final    NO GROWTH 5 DAYS Performed at [facility]    Report Status 11/08/2016 FINAL  Final  Culture, blood (routine x 2)     Status: None    Collection Time: 11/02/16 11:30 PM  Result Value Ref Range Status   Specimen Description BLOOD BLOOD LEFT FOREARM  Final   Special Requests BOTTLES DRAWN AEROBIC AND ANAEROBIC 5ML EA  Final   Culture   Final    NO GROWTH 5 DAYS Performed at [facility]    Report Status 11/08/2016 FINAL  Final  Urine culture     Status: None   Collection Time: 11/03/16 12:44 AM  Result Value Ref Range Status   Specimen  Description URINE, RANDOM  Final   Special Requests NONE  Final   Culture NO GROWTH Performed at [facility]   Final   Report Status 11/05/2016 FINAL  Final  Respiratory Panel by PCR     Status: None   Collection Time: 11/04/16 12:09 AM  Result Value Ref Range Status   Adenovirus NOT DETECTED NOT DETECTED Final   Coronavirus 229E NOT DETECTED NOT DETECTED Final   Coronavirus HKU1 NOT DETECTED NOT DETECTED Final   Coronavirus NL63 NOT DETECTED NOT DETECTED Final   Coronavirus OC43 NOT DETECTED NOT DETECTED Final   Metapneumovirus NOT DETECTED NOT DETECTED Final   Rhinovirus / Enterovirus NOT DETECTED NOT DETECTED Final   Influenza A NOT DETECTED NOT DETECTED Final   Influenza B NOT DETECTED NOT DETECTED Final   Parainfluenza Virus 1 NOT DETECTED NOT DETECTED Final   Parainfluenza Virus 2 NOT DETECTED NOT DETECTED Final   Parainfluenza Virus 3 NOT DETECTED NOT DETECTED Final   Parainfluenza Virus 4 NOT DETECTED NOT DETECTED Final   Respiratory Syncytial Virus NOT DETECTED NOT DETECTED Final   Bordetella pertussis NOT DETECTED NOT DETECTED Final   Chlamydophila pneumoniae NOT DETECTED NOT DETECTED Final   Mycoplasma pneumoniae NOT DETECTED NOT DETECTED Final    Comment: Performed at [facility]  Culture, blood (Routine X 2) w Reflex to ID Panel     Status: Abnormal   Collection Time: 11/06/16  5:15 PM  Result Value Ref Range Status   Specimen Description BLOOD LEFT HAND  Final   Special Requests IN PEDIATRIC BOTTLE .5CC  Final   Culture  Setup Time    Final    GRAM POSITIVE COCCI IN CLUSTERS AEROBIC BOTTLE ONLY CRITICAL RESULT CALLED TO, READ BACK BY AND VERIFIED WITHRichrd Sox PHARMD 2019 11/8/147 A BROWNING Performed at [facility]    Culture STAPHYLOCOCCUS SPECIES (COAGULASE NEGATIVE) (A)  Final   Report Status 11/09/2016 FINAL  Final   Organism ID, Bacteria STAPHYLOCOCCUS SPECIES (COAGULASE NEGATIVE)  Final      Susceptibility   Staphylococcus species (coagulase negative) - MIC*    CIPROFLOXACIN <=0.5 SENSITIVE Sensitive     ERYTHROMYCIN <=0.25 SENSITIVE Sensitive     GENTAMICIN <=0.5 SENSITIVE Sensitive     OXACILLIN <=0.25 SENSITIVE Sensitive     TETRACYCLINE <=1 SENSITIVE Sensitive     VANCOMYCIN <=0.5 SENSITIVE Sensitive     TRIMETH/SULFA <=10 SENSITIVE Sensitive     CLINDAMYCIN <=0.25 SENSITIVE Sensitive     RIFAMPIN <=0.5 SENSITIVE Sensitive     Inducible Clindamycin NEGATIVE Sensitive     * STAPHYLOCOCCUS SPECIES (COAGULASE NEGATIVE)  Culture, blood (Routine X 2) w Reflex to ID Panel     Status: Abnormal   Collection Time: 11/06/16  5:20 PM  Result Value Ref Range Status   Specimen Description BLOOD LEFT HAND  Final   Special Requests IN PEDIATRIC BOTTLE .5CC  Final   Culture  Setup Time   Final    GRAM POSITIVE COCCI IN CLUSTERS AEROBIC BOTTLE ONLY CRITICAL VALUE NOTED.  VALUE IS CONSISTENT WITH PREVIOUSLY REPORTED AND CALLED VALUE.    Culture (A)  Final    STAPHYLOCOCCUS SPECIES (COAGULASE NEGATIVE) SUSCEPTIBILITIES PERFORMED ON PREVIOUS CULTURE WITHIN THE LAST 5 DAYS. Performed at [facility]    Report Status 11/09/2016 FINAL  Final  Culture, blood (Routine X 2) w Reflex to ID Panel     Status: Abnormal   Collection Time: 11/08/16  1:24 PM  Result Value Ref Range Status  Specimen Description BLOOD LEFT ARM  Final   Special Requests IN PEDIATRIC BOTTLE 2CC  Final   Culture  Setup Time   Final    AEROBIC BOTTLE ONLY GRAM POSITIVE COCCI IN CLUSTERS CRITICAL VALUE NOTED.  VALUE IS  CONSISTENT WITH PREVIOUSLY REPORTED AND CALLED VALUE.    Culture (A)  Final    STAPHYLOCOCCUS LUGDUNENSIS SUSCEPTIBILITIES PERFORMED ON PREVIOUS CULTURE WITHIN THE LAST 5 DAYS. Performed at [facility]    Report Status 11/11/2016 FINAL  Final  Culture, blood (Routine X 2) w Reflex to ID Panel     Status: Abnormal   Collection Time: 11/08/16  1:24 PM  Result Value Ref Range Status   Specimen Description BLOOD LEFT ARM  Final   Special Requests IN PEDIATRIC BOTTLE 2CC  Final   Culture  Setup Time   Final    GRAM POSITIVE COCCI IN CLUSTERS AEROBIC BOTTLE ONLY CRITICAL RESULT CALLED TO, READ BACK BY AND VERIFIED WITH: D ZEIGLER 11/09/16 @ [place] Performed at [place] (A)  Final   Report Status 11/11/2016 FINAL  Final   Organism ID, Bacteria STAPHYLOCOCCUS LUGDUNENSIS  Final      Susceptibility   Staphylococcus lugdunensis - MIC*    CIPROFLOXACIN 1 INTERMEDIATE Intermediate     ERYTHROMYCIN >=8 RESISTANT Resistant     GENTAMICIN <=0.5 SENSITIVE Sensitive     OXACILLIN >=4 RESISTANT Resistant     TETRACYCLINE >=16 RESISTANT Resistant     VANCOMYCIN 1 SENSITIVE Sensitive     TRIMETH/SULFA <=10 SENSITIVE Sensitive     CLINDAMYCIN <=0.25 RESISTANT Resistant     RIFAMPIN <=0.5 SENSITIVE Sensitive     Inducible Clindamycin POSITIVE Resistant     * STAPHYLOCOCCUS LUGDUNENSIS  Blood Culture ID Panel (Reflexed)     Status: Abnormal   Collection Time: 11/08/16  1:24 PM  Result Value Ref Range Status   Enterococcus species NOT DETECTED NOT DETECTED Final   Listeria monocytogenes NOT DETECTED NOT DETECTED Final   Staphylococcus species DETECTED (A) NOT DETECTED Final    Comment: CRITICAL RESULT CALLED TO, READ BACK BY AND VERIFIED WITH: D ZEIGLER 11/09/16 @ 1331 M VESTAL    Staphylococcus aureus NOT DETECTED NOT DETECTED Final   Methicillin resistance DETECTED (A) NOT DETECTED Final    Comment: CRITICAL RESULT CALLED TO,  READ BACK BY AND VERIFIED WITH: D ZEIGLER 11/09/16 @ 1331 M VESTAL    Streptococcus species NOT DETECTED NOT DETECTED Final   Streptococcus agalactiae NOT DETECTED NOT DETECTED Final   Streptococcus pneumoniae NOT DETECTED NOT DETECTED Final   Streptococcus pyogenes NOT DETECTED NOT DETECTED Final   Acinetobacter baumannii NOT DETECTED NOT DETECTED Final   Enterobacteriaceae species NOT DETECTED NOT DETECTED Final   Enterobacter cloacae complex NOT DETECTED NOT DETECTED Final   Escherichia coli NOT DETECTED NOT DETECTED Final   Klebsiella oxytoca NOT DETECTED NOT DETECTED Final   Klebsiella pneumoniae NOT DETECTED NOT DETECTED Final   Proteus species NOT DETECTED NOT DETECTED Final   Serratia marcescens NOT DETECTED NOT DETECTED Final   Haemophilus influenzae NOT DETECTED NOT DETECTED Final   Neisseria meningitidis NOT DETECTED NOT DETECTED Final   Pseudomonas aeruginosa NOT DETECTED NOT DETECTED Final   Candida albicans NOT DETECTED NOT DETECTED Final   Candida glabrata NOT DETECTED NOT DETECTED Final   Candida krusei NOT DETECTED NOT DETECTED Final   Candida parapsilosis NOT DETECTED NOT DETECTED Final   Candida tropicalis NOT DETECTED NOT DETECTED Final  Comment: Performed at [facility]     Medications:   . free water  200 mL Per Tube Q8H  . sodium chloride  1,000 mL Intravenous Once   Continuous Infusions:   Time spent: 15 min   LOS: 8 days   Charlynne Cousins  [facility] Pager [phone]  *Please refer to [email or web address], password [email or web address] to get updated schedule on who will round on this patient, as hospitalists switch teams weekly. If 7PM-7AM, please contact night-coverage at [email or web address], password [email or web address] for any overnight needs.  11/11/2016, 10:43 AM

## 2016-11-11 NOTE — Progress Notes (Signed)
[facility] Liaison Note  Met briefly with daughter Sharyn Lull at bedside who continues to wait for call from [facility] for equipment delivery. Spoke with [facility] DME coordinator who is requesting delivery person to give Sharyn Lull a call on her cell phone to give estimated time to be at her home. [facility], daughter Sharyn Lull and RNCM [place] aware [facility] has schedule RN visit this afternoon between 2 and 3 PM today.   Please send signed completed out of facility DNR home with patient.  Please send prescription for any medications patient does not already have including comfort medications.  Discharge summary and today's progress note have been faxed to [phone].   Please notify [facility] when patient is ready to leave unit at discharge-call [phone].   Thank you,  Erling Conte, LCSW [phone]

## 2016-11-11 NOTE — Plan of Care (Signed)
Problem: Health Behavior/Discharge Planning: Goal: Ability to manage health-related needs will improve Outcome: Adequate for Discharge Home with Hospice support

## 2016-11-11 NOTE — Progress Notes (Signed)
Discharged from floor via EMT stretcher for transport to daughter's home by ambulance. No changes in assessment. Koty Anctil, [employer]

## 2016-11-12 ENCOUNTER — Ambulatory Visit (HOSPITAL_COMMUNITY): Admit: 2016-11-12 | Payer: Medicare Other | Admitting: Cardiology

## 2016-11-12 ENCOUNTER — Encounter (HOSPITAL_COMMUNITY): Payer: Self-pay

## 2016-11-12 ENCOUNTER — Ambulatory Visit: Payer: Medicare Other | Admitting: Radiation Oncology

## 2016-11-12 SURGERY — ECHOCARDIOGRAM, TRANSESOPHAGEAL
Anesthesia: Moderate Sedation

## 2016-11-13 ENCOUNTER — Ambulatory Visit: Payer: Medicare Other | Admitting: Radiation Oncology

## 2016-11-14 ENCOUNTER — Ambulatory Visit: Payer: Medicare Other | Admitting: Radiation Oncology

## 2016-11-15 ENCOUNTER — Ambulatory Visit: Payer: Medicare Other | Admitting: Radiation Oncology

## 2016-11-15 LAB — CULTURE, BLOOD (ROUTINE X 2)
CULTURE: NO GROWTH
Culture: NO GROWTH

## 2016-11-16 ENCOUNTER — Ambulatory Visit: Payer: Medicare Other | Admitting: Radiation Oncology

## 2016-11-18 ENCOUNTER — Ambulatory Visit: Payer: Medicare Other | Admitting: Radiation Oncology

## 2016-11-20 ENCOUNTER — Encounter: Payer: Self-pay | Admitting: Interventional Radiology

## 2016-11-20 ENCOUNTER — Ambulatory Visit: Payer: Medicare Other | Admitting: Radiation Oncology

## 2016-11-20 ENCOUNTER — Encounter: Payer: Medicare Other | Admitting: Cardiovascular Disease

## 2016-11-27 ENCOUNTER — Ambulatory Visit: Payer: Medicare Other | Admitting: Hematology

## 2016-11-30 DEATH — deceased

## 2016-12-10 NOTE — Progress Notes (Signed)
[place] Radiation Oncology Simulation and Treatment Planning Note   Name:  [name] MRN: [id number]   Date: 12/10/2016  DOB: 1931-11-02  Status:outpatient    DIAGNOSIS:    ICD-9-CM ICD-10-CM   1. [place] (hepatocellular carcinoma), surgery at [facility] March 2012 155.0 C22.0      CONSENT VERIFIED:yes   SET UP: Patient is setup supine   IMMOBILIZATION: The patient was immobilized using a Vac Loc bag and Abdominal Compression.   NARRATIVE:The patient was brought to the [place].  Identity was confirmed.  All relevant records and images related to the planned course of therapy were reviewed.  Then, the patient was positioned in a stable reproducible clinical set-up for radiation therapy. Abdominal compression was applied by me.  4D CT images were obtained and reproducible breathing pattern was confirmed. Free breathing CT images were obtained.  Skin markings were placed.  The CT images were loaded into the planning software where the target and avoidance structures were contoured.  The radiation prescription was entered and confirmed.    TREATMENT PLANNING NOTE:  Treatment planning then occurred. I have requested : MLC's, isodose plan, basic dose calculation.  3 dimensional simulation is performed and dose volume histogram of the gross tumor volume, planning tumor volume and criticial normal structures including the spinal cord and lungs were analyzed and requested.  Special treatment procedure was performed due to high dose per fraction.  The patient will be monitored for increased risk of toxicity.  Daily imaging using cone beam CT will be used for target localization.  ------------------------------------------------  Jodelle Gross, MD, PhD

## 2017-01-04 ENCOUNTER — Encounter (HOSPITAL_COMMUNITY): Payer: Self-pay | Admitting: Diagnostic Radiology

## 2017-06-04 NOTE — Telephone Encounter (Signed)
Open in error
